# Patient Record
Sex: Male | Born: 1955 | Race: White | Hispanic: No | Marital: Single | Smoking: Current every day smoker
Health system: Southern US, Community
[De-identification: ages and names within clinical notes are randomized; demographics above are authoritative.]

## PROBLEM LIST (undated history)

## (undated) DIAGNOSIS — R569 Unspecified convulsions: Secondary | ICD-10-CM

## (undated) DIAGNOSIS — G459 Transient cerebral ischemic attack, unspecified: Secondary | ICD-10-CM

## (undated) DIAGNOSIS — R455 Hostility: Secondary | ICD-10-CM

## (undated) DIAGNOSIS — H9190 Unspecified hearing loss, unspecified ear: Secondary | ICD-10-CM

## (undated) DIAGNOSIS — G2119 Other drug induced secondary parkinsonism: Secondary | ICD-10-CM

## (undated) DIAGNOSIS — E7800 Pure hypercholesterolemia, unspecified: Secondary | ICD-10-CM

## (undated) DIAGNOSIS — N433 Hydrocele, unspecified: Secondary | ICD-10-CM

## (undated) DIAGNOSIS — K4090 Unilateral inguinal hernia, without obstruction or gangrene, not specified as recurrent: Secondary | ICD-10-CM

## (undated) DIAGNOSIS — F209 Schizophrenia, unspecified: Secondary | ICD-10-CM

## (undated) DIAGNOSIS — Z933 Colostomy status: Secondary | ICD-10-CM

## (undated) DIAGNOSIS — E46 Unspecified protein-calorie malnutrition: Secondary | ICD-10-CM

## (undated) DIAGNOSIS — F259 Schizoaffective disorder, unspecified: Secondary | ICD-10-CM

## (undated) DIAGNOSIS — R41841 Cognitive communication deficit: Secondary | ICD-10-CM

## (undated) DIAGNOSIS — E079 Disorder of thyroid, unspecified: Secondary | ICD-10-CM

## (undated) DIAGNOSIS — R1310 Dysphagia, unspecified: Secondary | ICD-10-CM

## (undated) DIAGNOSIS — K469 Unspecified abdominal hernia without obstruction or gangrene: Secondary | ICD-10-CM

## (undated) HISTORY — PX: TESTICLE SURGERY: SHX794

## (undated) HISTORY — PX: LEG SURGERY: SHX1003

---

## 2004-01-31 ENCOUNTER — Emergency Department (HOSPITAL_COMMUNITY): Admission: EM | Admit: 2004-01-31 | Discharge: 2004-01-31 | Payer: Self-pay | Admitting: Emergency Medicine

## 2005-11-30 ENCOUNTER — Emergency Department (HOSPITAL_COMMUNITY): Admission: EM | Admit: 2005-11-30 | Discharge: 2005-11-30 | Payer: Self-pay | Admitting: Emergency Medicine

## 2011-01-18 DIAGNOSIS — F259 Schizoaffective disorder, unspecified: Secondary | ICD-10-CM | POA: Diagnosis not present

## 2011-03-23 DIAGNOSIS — R3915 Urgency of urination: Secondary | ICD-10-CM | POA: Diagnosis not present

## 2011-03-23 DIAGNOSIS — E039 Hypothyroidism, unspecified: Secondary | ICD-10-CM | POA: Diagnosis not present

## 2011-03-23 DIAGNOSIS — E782 Mixed hyperlipidemia: Secondary | ICD-10-CM | POA: Diagnosis not present

## 2011-03-23 DIAGNOSIS — F3132 Bipolar disorder, current episode depressed, moderate: Secondary | ICD-10-CM | POA: Diagnosis not present

## 2011-03-23 DIAGNOSIS — J309 Allergic rhinitis, unspecified: Secondary | ICD-10-CM | POA: Diagnosis not present

## 2011-03-23 DIAGNOSIS — F315 Bipolar disorder, current episode depressed, severe, with psychotic features: Secondary | ICD-10-CM | POA: Diagnosis not present

## 2011-04-12 DIAGNOSIS — F259 Schizoaffective disorder, unspecified: Secondary | ICD-10-CM | POA: Diagnosis not present

## 2011-07-05 DIAGNOSIS — F259 Schizoaffective disorder, unspecified: Secondary | ICD-10-CM | POA: Diagnosis not present

## 2011-07-18 DIAGNOSIS — J019 Acute sinusitis, unspecified: Secondary | ICD-10-CM | POA: Diagnosis not present

## 2011-09-21 DIAGNOSIS — J0100 Acute maxillary sinusitis, unspecified: Secondary | ICD-10-CM | POA: Diagnosis not present

## 2011-10-17 DIAGNOSIS — Z23 Encounter for immunization: Secondary | ICD-10-CM | POA: Diagnosis not present

## 2011-10-25 DIAGNOSIS — F259 Schizoaffective disorder, unspecified: Secondary | ICD-10-CM | POA: Diagnosis not present

## 2011-11-14 DIAGNOSIS — K5900 Constipation, unspecified: Secondary | ICD-10-CM | POA: Diagnosis not present

## 2011-11-14 DIAGNOSIS — E039 Hypothyroidism, unspecified: Secondary | ICD-10-CM | POA: Diagnosis not present

## 2011-11-14 DIAGNOSIS — R634 Abnormal weight loss: Secondary | ICD-10-CM | POA: Diagnosis not present

## 2011-11-14 DIAGNOSIS — E782 Mixed hyperlipidemia: Secondary | ICD-10-CM | POA: Diagnosis not present

## 2012-01-09 DIAGNOSIS — K5900 Constipation, unspecified: Secondary | ICD-10-CM | POA: Diagnosis not present

## 2012-01-09 DIAGNOSIS — K4090 Unilateral inguinal hernia, without obstruction or gangrene, not specified as recurrent: Secondary | ICD-10-CM | POA: Diagnosis not present

## 2012-02-14 DIAGNOSIS — F259 Schizoaffective disorder, unspecified: Secondary | ICD-10-CM | POA: Diagnosis not present

## 2012-02-15 DIAGNOSIS — K5901 Slow transit constipation: Secondary | ICD-10-CM | POA: Diagnosis not present

## 2012-05-16 DIAGNOSIS — E782 Mixed hyperlipidemia: Secondary | ICD-10-CM | POA: Diagnosis not present

## 2012-05-16 DIAGNOSIS — N433 Hydrocele, unspecified: Secondary | ICD-10-CM | POA: Diagnosis not present

## 2012-06-05 DIAGNOSIS — F259 Schizoaffective disorder, unspecified: Secondary | ICD-10-CM | POA: Diagnosis not present

## 2012-08-28 DIAGNOSIS — F209 Schizophrenia, unspecified: Secondary | ICD-10-CM | POA: Diagnosis not present

## 2012-09-02 DIAGNOSIS — Z125 Encounter for screening for malignant neoplasm of prostate: Secondary | ICD-10-CM | POA: Diagnosis not present

## 2012-09-02 DIAGNOSIS — F319 Bipolar disorder, unspecified: Secondary | ICD-10-CM | POA: Diagnosis not present

## 2012-09-02 DIAGNOSIS — J449 Chronic obstructive pulmonary disease, unspecified: Secondary | ICD-10-CM | POA: Diagnosis not present

## 2012-09-02 DIAGNOSIS — E039 Hypothyroidism, unspecified: Secondary | ICD-10-CM | POA: Diagnosis not present

## 2012-09-02 DIAGNOSIS — R634 Abnormal weight loss: Secondary | ICD-10-CM | POA: Diagnosis not present

## 2012-09-11 ENCOUNTER — Other Ambulatory Visit (HOSPITAL_COMMUNITY): Payer: Self-pay | Admitting: Pulmonary Disease

## 2012-09-11 DIAGNOSIS — R634 Abnormal weight loss: Secondary | ICD-10-CM

## 2012-09-16 ENCOUNTER — Ambulatory Visit (HOSPITAL_COMMUNITY): Payer: Medicare Other

## 2012-09-16 ENCOUNTER — Ambulatory Visit (HOSPITAL_COMMUNITY)
Admission: RE | Admit: 2012-09-16 | Discharge: 2012-09-16 | Disposition: A | Discharge status: Home / Self Care | Payer: Medicare Other | Source: Ambulatory Visit | Attending: Pulmonary Disease | Admitting: Pulmonary Disease

## 2012-09-16 DIAGNOSIS — R109 Unspecified abdominal pain: Secondary | ICD-10-CM

## 2012-09-16 DIAGNOSIS — J449 Chronic obstructive pulmonary disease, unspecified: Secondary | ICD-10-CM

## 2012-09-16 DIAGNOSIS — R634 Abnormal weight loss: Secondary | ICD-10-CM

## 2012-09-16 MED ORDER — IOHEXOL 300 MG/ML  SOLN: 100.0000 mL | Freq: Once | INTRAMUSCULAR | Status: AC | PRN

## 2012-09-19 ENCOUNTER — Other Ambulatory Visit (HOSPITAL_COMMUNITY): Payer: Self-pay

## 2012-09-19 ENCOUNTER — Ambulatory Visit (HOSPITAL_COMMUNITY)
Admission: RE | Admit: 2012-09-19 | Discharge: 2012-09-19 | Disposition: A | Discharge status: Home / Self Care | Payer: Medicare Other | Source: Ambulatory Visit | Attending: Pulmonary Disease | Admitting: Pulmonary Disease

## 2012-09-19 DIAGNOSIS — R933 Abnormal findings on diagnostic imaging of other parts of digestive tract: Secondary | ICD-10-CM | POA: Insufficient documentation

## 2012-09-19 DIAGNOSIS — R109 Unspecified abdominal pain: Secondary | ICD-10-CM | POA: Insufficient documentation

## 2012-09-19 DIAGNOSIS — K4090 Unilateral inguinal hernia, without obstruction or gangrene, not specified as recurrent: Secondary | ICD-10-CM | POA: Insufficient documentation

## 2012-09-19 DIAGNOSIS — R634 Abnormal weight loss: Secondary | ICD-10-CM | POA: Insufficient documentation

## 2012-09-19 DIAGNOSIS — J4489 Other specified chronic obstructive pulmonary disease: Secondary | ICD-10-CM | POA: Insufficient documentation

## 2012-09-19 DIAGNOSIS — J449 Chronic obstructive pulmonary disease, unspecified: Secondary | ICD-10-CM | POA: Diagnosis not present

## 2012-09-19 DIAGNOSIS — R918 Other nonspecific abnormal finding of lung field: Secondary | ICD-10-CM | POA: Insufficient documentation

## 2012-09-19 DIAGNOSIS — J438 Other emphysema: Secondary | ICD-10-CM | POA: Diagnosis not present

## 2012-09-19 DIAGNOSIS — K7689 Other specified diseases of liver: Secondary | ICD-10-CM | POA: Diagnosis not present

## 2012-09-19 MED ORDER — IOHEXOL 300 MG/ML  SOLN
100.0000 mL | Freq: Once | INTRAMUSCULAR | Status: AC | PRN
  Administered 2012-09-19: 100 mL via INTRAVENOUS

## 2012-09-29 ENCOUNTER — Encounter (HOSPITAL_COMMUNITY): Payer: Self-pay | Admitting: *Deleted

## 2012-09-29 ENCOUNTER — Emergency Department (HOSPITAL_COMMUNITY): Payer: Medicare Other

## 2012-09-29 ENCOUNTER — Emergency Department (HOSPITAL_COMMUNITY)
Admission: EM | Admit: 2012-09-29 | Discharge: 2012-09-29 | Disposition: A | Discharge status: Home / Self Care | Payer: Medicare Other | Attending: Emergency Medicine | Admitting: Emergency Medicine

## 2012-09-29 DIAGNOSIS — E7800 Pure hypercholesterolemia, unspecified: Secondary | ICD-10-CM | POA: Diagnosis not present

## 2012-09-29 DIAGNOSIS — K4090 Unilateral inguinal hernia, without obstruction or gangrene, not specified as recurrent: Secondary | ICD-10-CM | POA: Diagnosis not present

## 2012-09-29 DIAGNOSIS — Z8719 Personal history of other diseases of the digestive system: Secondary | ICD-10-CM | POA: Insufficient documentation

## 2012-09-29 DIAGNOSIS — Z87891 Personal history of nicotine dependence: Secondary | ICD-10-CM | POA: Insufficient documentation

## 2012-09-29 DIAGNOSIS — Z8669 Personal history of other diseases of the nervous system and sense organs: Secondary | ICD-10-CM | POA: Diagnosis not present

## 2012-09-29 DIAGNOSIS — K529 Noninfective gastroenteritis and colitis, unspecified: Secondary | ICD-10-CM

## 2012-09-29 DIAGNOSIS — Z79899 Other long term (current) drug therapy: Secondary | ICD-10-CM | POA: Insufficient documentation

## 2012-09-29 DIAGNOSIS — E079 Disorder of thyroid, unspecified: Secondary | ICD-10-CM | POA: Diagnosis not present

## 2012-09-29 DIAGNOSIS — K5289 Other specified noninfective gastroenteritis and colitis: Secondary | ICD-10-CM | POA: Diagnosis not present

## 2012-09-29 DIAGNOSIS — F209 Schizophrenia, unspecified: Secondary | ICD-10-CM | POA: Insufficient documentation

## 2012-09-29 HISTORY — DX: Unspecified hearing loss, unspecified ear: H91.90

## 2012-09-29 HISTORY — DX: Schizophrenia, unspecified: F20.9

## 2012-09-29 HISTORY — DX: Pure hypercholesterolemia, unspecified: E78.00

## 2012-09-29 HISTORY — DX: Disorder of thyroid, unspecified: E07.9

## 2012-09-29 HISTORY — DX: Unspecified abdominal hernia without obstruction or gangrene: K46.9

## 2012-09-29 LAB — CBC WITH DIFFERENTIAL/PLATELET
Eosinophils Absolute: 0.1 10*3/uL (ref 0.0–0.7)
Lymphs Abs: 1.8 10*3/uL (ref 0.7–4.0)
MCH: 29.8 pg (ref 26.0–34.0)
Neutro Abs: 11.8 10*3/uL — ABNORMAL HIGH (ref 1.7–7.7)
Neutrophils Relative %: 82 % — ABNORMAL HIGH (ref 43–77)
Platelets: 104 10*3/uL — ABNORMAL LOW (ref 150–400)
RBC: 4.2 MIL/uL — ABNORMAL LOW (ref 4.22–5.81)
WBC: 14.5 10*3/uL — ABNORMAL HIGH (ref 4.0–10.5)

## 2012-09-29 LAB — BASIC METABOLIC PANEL
CO2: 27 mEq/L (ref 19–32)
Calcium: 10.3 mg/dL (ref 8.4–10.5)
GFR calc non Af Amer: 71 mL/min — ABNORMAL LOW (ref >90)
Potassium: 3.8 mEq/L (ref 3.5–5.1)
Sodium: 136 mEq/L (ref 135–145)

## 2012-09-29 MED ORDER — SODIUM CHLORIDE 0.9 % IV BOLUS (SEPSIS)
1000.0000 mL | Freq: Once | INTRAVENOUS | Status: AC
  Administered 2012-09-29: 1000 mL via INTRAVENOUS

## 2012-09-29 MED ORDER — DIPHENOXYLATE-ATROPINE 2.5-0.025 MG PO TABS: 1.0000 | ORAL_TABLET | Freq: Four times a day (QID) | ORAL | Status: DC | PRN

## 2012-09-29 NOTE — ED Provider Notes (Signed)
CSN: [id number]     Arrival date & time 09/29/12  1819 History   First MD Initiated Contact with Patient 09/29/12 1855     Chief Complaint  Patient presents with  . Diarrhea   (Consider location/radiation/quality/duration/timing/severity/associated sxs/prior Treatment) Patient is a 57 y.o. male presenting with diarrhea. The history is provided by the nursing home (pt started with diarhea today.  pt had 4 bouts of diarhea).  Diarrhea Quality:  Watery Severity:  Moderate Onset quality:  Sudden Timing:  Intermittent Progression:  Unchanged Associated symptoms: no abdominal pain and no headaches     Past Medical History  Diagnosis Date  . Schizophrenia   . Hernia   . Thyroid disease   . High cholesterol   . HOH (hard of hearing)    Past Surgical History  Procedure Laterality Date  . Leg surgery    . Testicle surgery     No family history on file. History  Substance Use Topics  . Smoking status: Former Games developer  . Smokeless tobacco: Not on file  . Alcohol Use: Not on file    Review of Systems  Constitutional: Negative for appetite change and fatigue.  HENT: Negative for congestion, sinus pressure and ear discharge.   Eyes: Negative for discharge.  Respiratory: Negative for cough.   Cardiovascular: Negative for chest pain.  Gastrointestinal: Positive for diarrhea. Negative for abdominal pain.  Genitourinary: Negative for frequency and hematuria.  Musculoskeletal: Negative for back pain.  Skin: Negative for rash.  Neurological: Negative for seizures and headaches.  Psychiatric/Behavioral: Negative for hallucinations.    Allergies  Review of patient's allergies indicates no known allergies.  Home Medications   Current Outpatient Rx  Name  Route  Sig  Dispense  Refill  . fish oil-omega-3 fatty acids 1000 MG capsule   Oral   Take 1 g by mouth 3 (three) times daily.         . haloperidol (HALDOL) 10 MG tablet   Oral   Take 10 mg by mouth every evening.         . haloperidol decanoate (HALDOL DECANOATE) 100 MG/ML injection   Intramuscular   Inject 100 mg into the muscle every 28 (twenty-eight) days. Administered by [facility]         . levothyroxine (SYNTHROID, LEVOTHROID) 100 MCG tablet   Oral   Take 100 mcg by mouth daily before breakfast.         . lithium carbonate 300 MG capsule   Oral   Take 300 mg by mouth 2 (two) times daily.         Marland Kitchen loratadine (CLARITIN) 10 MG tablet   Oral   Take 10 mg by mouth daily.         Marland Kitchen lovastatin (MEVACOR) 20 MG tablet   Oral   Take 20 mg by mouth daily.         . nicotine (NICODERM CQ - DOSED IN MG/24 HOURS) 21 mg/24hr patch   Transdermal   Place 1 patch onto the skin daily.         Marland Kitchen oxybutynin (DITROPAN-XL) 10 MG 24 hr tablet   Oral   Take 10 mg by mouth daily.         . polyethylene glycol powder (GLYCOLAX/MIRALAX) powder   Oral   Take 17 g by mouth daily. Dissolved in 8 ounces of water/juice daily         . diphenoxylate-atropine (LOMOTIL) 2.5-0.025 MG per tablet   Oral   Take 1  tablet by mouth 4 (four) times daily as needed for diarrhea or loose stools.   10 tablet   0    BP 124/72  Pulse 87  Temp(Src) 98.5 F (36.9 C) (Oral)  Resp 18  Ht 5\' 5"  (1.651 m)  Wt 131 lb (59.421 kg)  BMI 21.8 kg/m2  SpO2 94% Physical Exam  Constitutional: He appears well-developed.  HENT:  Head: Normocephalic.  Eyes: Conjunctivae and EOM are normal. No scleral icterus.  Neck: Neck supple. No thyromegaly present.  Cardiovascular: Normal rate and regular rhythm.  Exam reveals no gallop and no friction rub.   No murmur heard. Pulmonary/Chest: No stridor. He has no wheezes. He has no rales. He exhibits no tenderness.  Abdominal: He exhibits no distension. There is no tenderness. There is no rebound.  Musculoskeletal: Normal range of motion. He exhibits no edema.  Lymphadenopathy:    He has no cervical adenopathy.  Neurological: He is alert. Coordination normal.  Skin: No rash  noted. No erythema.  Psychiatric: He has a normal mood and affect. His behavior is normal.    ED Course  Procedures (including critical care time) Labs Review Labs Reviewed  CBC WITH DIFFERENTIAL - Abnormal; Notable for the following:    WBC 14.5 (*)    RBC 4.20 (*)    Hemoglobin 12.5 (*)    HCT 38.6 (*)    Platelets 104 (*)    Neutrophils Relative % 82 (*)    Neutro Abs 11.8 (*)    All other components within normal limits  BASIC METABOLIC PANEL - Abnormal; Notable for the following:    Glucose, Bld 132 (*)    GFR calc non Af Amer 71 (*)    GFR calc Af Amer 83 (*)    All other components within normal limits   Imaging Review Dg Abd Acute W/chest  09/29/2012   CLINICAL DATA:  Progressive diarrhea over the last month.  EXAM: ACUTE ABDOMEN SERIES (ABDOMEN 2 VIEW & CHEST 1 VIEW)  COMPARISON:  Abdominal pelvic CT 09/19/2012.  FINDINGS: The heart size and mediastinal contours are normal. The lungs are clear. There is no pleural effusion or pneumothorax. No acute osseous findings are identified.  Scattered air-fluid levels are noted within the stomach, small bowel and colon. No significant bowel distention or wall thickening is identified. Gas is again noted in the left perineal region, corresponding with a large inguinal hernia on prior CT. There is no free intraperitoneal air. There are no suspicious calcifications. Mild lumbar spondylosis is noted.  IMPRESSION: Nonspecific bowel gas pattern with scattered air-fluid levels in nondistended bowel. Known left inguinal hernia. No active cardiopulmonary process.   Electronically Signed   By: Roxy Horseman   On: 09/29/2012 20:10    MDM   1. Gastroenteritis        Benny Lennert, MD 09/29/12 2045

## 2012-09-29 NOTE — ED Notes (Signed)
Pt c/o diarrhea for the past month that has gotten worse, pt c/o pain to knees, feet, rectal area, throat. Lives in assisted living facility. [facility],

## 2012-10-03 DIAGNOSIS — R634 Abnormal weight loss: Secondary | ICD-10-CM | POA: Diagnosis not present

## 2012-10-03 DIAGNOSIS — H902 Conductive hearing loss, unspecified: Secondary | ICD-10-CM | POA: Diagnosis not present

## 2012-10-03 DIAGNOSIS — K432 Incisional hernia without obstruction or gangrene: Secondary | ICD-10-CM | POA: Diagnosis not present

## 2012-10-08 DIAGNOSIS — K4090 Unilateral inguinal hernia, without obstruction or gangrene, not specified as recurrent: Secondary | ICD-10-CM | POA: Diagnosis not present

## 2012-10-24 ENCOUNTER — Ambulatory Visit (INDEPENDENT_AMBULATORY_CARE_PROVIDER_SITE_OTHER): Payer: Medicare Other | Admitting: Otolaryngology

## 2012-10-24 DIAGNOSIS — H6120 Impacted cerumen, unspecified ear: Secondary | ICD-10-CM | POA: Diagnosis not present

## 2012-10-24 DIAGNOSIS — H906 Mixed conductive and sensorineural hearing loss, bilateral: Secondary | ICD-10-CM | POA: Diagnosis not present

## 2012-11-14 DIAGNOSIS — J449 Chronic obstructive pulmonary disease, unspecified: Secondary | ICD-10-CM | POA: Diagnosis not present

## 2012-11-14 DIAGNOSIS — K4090 Unilateral inguinal hernia, without obstruction or gangrene, not specified as recurrent: Secondary | ICD-10-CM | POA: Diagnosis not present

## 2012-11-14 DIAGNOSIS — I10 Essential (primary) hypertension: Secondary | ICD-10-CM | POA: Diagnosis not present

## 2012-11-19 DIAGNOSIS — K4090 Unilateral inguinal hernia, without obstruction or gangrene, not specified as recurrent: Secondary | ICD-10-CM | POA: Diagnosis not present

## 2012-11-20 DIAGNOSIS — Z79899 Other long term (current) drug therapy: Secondary | ICD-10-CM | POA: Diagnosis not present

## 2012-11-20 DIAGNOSIS — F209 Schizophrenia, unspecified: Secondary | ICD-10-CM | POA: Diagnosis not present

## 2013-03-12 DIAGNOSIS — F209 Schizophrenia, unspecified: Secondary | ICD-10-CM | POA: Diagnosis not present

## 2013-04-11 ENCOUNTER — Emergency Department (HOSPITAL_COMMUNITY): Payer: Medicare Other

## 2013-04-11 ENCOUNTER — Observation Stay (HOSPITAL_COMMUNITY): Payer: Medicare Other

## 2013-04-11 ENCOUNTER — Encounter (HOSPITAL_COMMUNITY): Payer: Self-pay | Admitting: Emergency Medicine

## 2013-04-11 ENCOUNTER — Inpatient Hospital Stay (HOSPITAL_COMMUNITY)
Admission: EM | Admit: 2013-04-11 | Discharge: 2013-04-16 | DRG: 871 | Disposition: A | Discharge status: Home Health | Payer: Medicare Other | Attending: Pulmonary Disease | Admitting: Pulmonary Disease

## 2013-04-11 DIAGNOSIS — R6510 Systemic inflammatory response syndrome (SIRS) of non-infectious origin without acute organ dysfunction: Secondary | ICD-10-CM | POA: Diagnosis present

## 2013-04-11 DIAGNOSIS — R55 Syncope and collapse: Secondary | ICD-10-CM | POA: Diagnosis not present

## 2013-04-11 DIAGNOSIS — Z87891 Personal history of nicotine dependence: Secondary | ICD-10-CM | POA: Diagnosis not present

## 2013-04-11 DIAGNOSIS — R32 Unspecified urinary incontinence: Secondary | ICD-10-CM | POA: Diagnosis present

## 2013-04-11 DIAGNOSIS — J690 Pneumonitis due to inhalation of food and vomit: Secondary | ICD-10-CM | POA: Diagnosis not present

## 2013-04-11 DIAGNOSIS — E46 Unspecified protein-calorie malnutrition: Secondary | ICD-10-CM | POA: Diagnosis present

## 2013-04-11 DIAGNOSIS — D696 Thrombocytopenia, unspecified: Secondary | ICD-10-CM | POA: Diagnosis present

## 2013-04-11 DIAGNOSIS — E039 Hypothyroidism, unspecified: Secondary | ICD-10-CM | POA: Diagnosis not present

## 2013-04-11 DIAGNOSIS — K4090 Unilateral inguinal hernia, without obstruction or gangrene, not specified as recurrent: Secondary | ICD-10-CM | POA: Diagnosis not present

## 2013-04-11 DIAGNOSIS — F209 Schizophrenia, unspecified: Secondary | ICD-10-CM | POA: Diagnosis present

## 2013-04-11 DIAGNOSIS — R569 Unspecified convulsions: Secondary | ICD-10-CM | POA: Diagnosis not present

## 2013-04-11 DIAGNOSIS — Z79899 Other long term (current) drug therapy: Secondary | ICD-10-CM | POA: Diagnosis not present

## 2013-04-11 DIAGNOSIS — Z8673 Personal history of transient ischemic attack (TIA), and cerebral infarction without residual deficits: Secondary | ICD-10-CM

## 2013-04-11 DIAGNOSIS — A419 Sepsis, unspecified organism: Principal | ICD-10-CM | POA: Diagnosis present

## 2013-04-11 DIAGNOSIS — E43 Unspecified severe protein-calorie malnutrition: Secondary | ICD-10-CM | POA: Diagnosis not present

## 2013-04-11 DIAGNOSIS — H9190 Unspecified hearing loss, unspecified ear: Secondary | ICD-10-CM | POA: Diagnosis present

## 2013-04-11 DIAGNOSIS — E7800 Pure hypercholesterolemia, unspecified: Secondary | ICD-10-CM | POA: Diagnosis present

## 2013-04-11 DIAGNOSIS — Z681 Body mass index (BMI) 19 or less, adult: Secondary | ICD-10-CM | POA: Diagnosis not present

## 2013-04-11 DIAGNOSIS — J189 Pneumonia, unspecified organism: Secondary | ICD-10-CM | POA: Diagnosis not present

## 2013-04-11 DIAGNOSIS — I369 Nonrheumatic tricuspid valve disorder, unspecified: Secondary | ICD-10-CM | POA: Diagnosis not present

## 2013-04-11 DIAGNOSIS — F489 Nonpsychotic mental disorder, unspecified: Secondary | ICD-10-CM | POA: Diagnosis not present

## 2013-04-11 DIAGNOSIS — S060X9A Concussion with loss of consciousness of unspecified duration, initial encounter: Secondary | ICD-10-CM | POA: Diagnosis not present

## 2013-04-11 DIAGNOSIS — J9600 Acute respiratory failure, unspecified whether with hypoxia or hypercapnia: Secondary | ICD-10-CM | POA: Diagnosis not present

## 2013-04-11 DIAGNOSIS — S298XXA Other specified injuries of thorax, initial encounter: Secondary | ICD-10-CM | POA: Diagnosis not present

## 2013-04-11 LAB — COMPREHENSIVE METABOLIC PANEL
ALBUMIN: 3.5 g/dL (ref 3.5–5.2)
ALK PHOS: 55 U/L (ref 39–117)
ALT: 7 U/L (ref 0–53)
AST: 13 U/L (ref 0–37)
BILIRUBIN TOTAL: 0.4 mg/dL (ref 0.3–1.2)
BUN: 20 mg/dL (ref 6–23)
CHLORIDE: 101 meq/L (ref 96–112)
CO2: 26 mEq/L (ref 19–32)
Calcium: 9.7 mg/dL (ref 8.4–10.5)
Creatinine, Ser: 1.23 mg/dL (ref 0.50–1.35)
GFR calc Af Amer: 74 mL/min — ABNORMAL LOW (ref >90)
GFR calc non Af Amer: 64 mL/min — ABNORMAL LOW (ref >90)
Glucose, Bld: 192 mg/dL — ABNORMAL HIGH (ref 70–99)
POTASSIUM: 4.1 meq/L (ref 3.7–5.3)
SODIUM: 139 meq/L (ref 137–147)
TOTAL PROTEIN: 7.4 g/dL (ref 6.0–8.3)

## 2013-04-11 LAB — URINALYSIS, ROUTINE W REFLEX MICROSCOPIC
BILIRUBIN URINE: NEGATIVE
Glucose, UA: NEGATIVE mg/dL
HGB URINE DIPSTICK: NEGATIVE
Ketones, ur: NEGATIVE mg/dL
Leukocytes, UA: NEGATIVE
NITRITE: NEGATIVE
PH: 6.5 (ref 5.0–8.0)
Protein, ur: NEGATIVE mg/dL
SPECIFIC GRAVITY, URINE: 1.01 (ref 1.005–1.030)
UROBILINOGEN UA: 0.2 mg/dL (ref 0.0–1.0)

## 2013-04-11 LAB — TROPONIN I: Troponin I: <0.3 ng/mL (ref <0.30)

## 2013-04-11 LAB — CBC WITH DIFFERENTIAL/PLATELET
BASOS ABS: 0 10*3/uL (ref 0.0–0.1)
Basophils Relative: 0 % (ref 0–1)
EOS ABS: 0 10*3/uL (ref 0.0–0.7)
Eosinophils Relative: 0 % (ref 0–5)
HEMATOCRIT: 34.4 % — AB (ref 39.0–52.0)
Hemoglobin: 11.4 g/dL — ABNORMAL LOW (ref 13.0–17.0)
LYMPHS PCT: 4 % — AB (ref 12–46)
Lymphs Abs: 0.8 10*3/uL (ref 0.7–4.0)
MCH: 29.7 pg (ref 26.0–34.0)
MCHC: 33.1 g/dL (ref 30.0–36.0)
MCV: 89.6 fL (ref 78.0–100.0)
MONOS PCT: 3 % (ref 3–12)
Monocytes Absolute: 0.6 10*3/uL (ref 0.1–1.0)
NEUTROS PCT: 93 % — AB (ref 43–77)
Neutro Abs: 17.7 10*3/uL — ABNORMAL HIGH (ref 1.7–7.7)
PLATELETS: 110 10*3/uL — AB (ref 150–400)
RBC: 3.84 MIL/uL — ABNORMAL LOW (ref 4.22–5.81)
RDW: 14.7 % (ref 11.5–15.5)
WBC MORPHOLOGY: INCREASED
WBC: 19.1 10*3/uL — AB (ref 4.0–10.5)

## 2013-04-11 LAB — RAPID URINE DRUG SCREEN, HOSP PERFORMED
AMPHETAMINES: NOT DETECTED
BARBITURATES: NOT DETECTED
BENZODIAZEPINES: NOT DETECTED
Cocaine: NOT DETECTED
Opiates: NOT DETECTED
Tetrahydrocannabinol: NOT DETECTED

## 2013-04-11 LAB — ETHANOL

## 2013-04-11 LAB — LACTIC ACID, PLASMA: Lactic Acid, Venous: 1.3 mmol/L (ref 0.5–2.2)

## 2013-04-11 LAB — LITHIUM LEVEL: LITHIUM LVL: 0.8 meq/L (ref 0.80–1.40)

## 2013-04-11 LAB — LIPASE, BLOOD: LIPASE: 12 U/L (ref 11–59)

## 2013-04-11 MED ORDER — SODIUM CHLORIDE 0.9 % IV SOLN
INTRAVENOUS | Status: DC
  Administered 2013-04-13 – 2013-04-15 (×3): via INTRAVENOUS

## 2013-04-11 MED ORDER — DOCUSATE SODIUM 100 MG PO CAPS: 100.0000 mg | ORAL_CAPSULE | Freq: Every evening | ORAL | Status: DC | PRN

## 2013-04-11 MED ORDER — PIPERACILLIN-TAZOBACTAM 3.375 G IVPB
INTRAVENOUS | Status: AC
  Filled 2013-04-11: qty 100

## 2013-04-11 MED ORDER — GUAIFENESIN 100 MG/5ML PO SOLN: 200.0000 mg | ORAL | Status: DC | PRN

## 2013-04-11 MED ORDER — NICOTINE 21 MG/24HR TD PT24: 21.0000 mg | MEDICATED_PATCH | TRANSDERMAL | Status: DC

## 2013-04-11 MED ORDER — ACETAMINOPHEN 325 MG PO TABS
650.0000 mg | ORAL_TABLET | Freq: Once | ORAL | Status: AC
  Administered 2013-04-11: 650 mg via ORAL
  Filled 2013-04-11: qty 2

## 2013-04-11 MED ORDER — BIOTENE DRY MOUTH MT LIQD
15.0000 mL | Freq: Two times a day (BID) | OROMUCOSAL | Status: DC
  Administered 2013-04-11 – 2013-04-16 (×10): 15 mL via OROMUCOSAL

## 2013-04-11 MED ORDER — OMEGA-3-ACID ETHYL ESTERS 1 G PO CAPS
1.0000 g | ORAL_CAPSULE | Freq: Three times a day (TID) | ORAL | Status: DC
  Administered 2013-04-11 – 2013-04-16 (×14): 1 g via ORAL
  Filled 2013-04-11 (×14): qty 1

## 2013-04-11 MED ORDER — OXYBUTYNIN CHLORIDE ER 5 MG PO TB24
10.0000 mg | ORAL_TABLET | Freq: Every day | ORAL | Status: DC
  Administered 2013-04-12 – 2013-04-16 (×5): 10 mg via ORAL
  Filled 2013-04-11 (×6): qty 2

## 2013-04-11 MED ORDER — VANCOMYCIN HCL IN DEXTROSE 1-5 GM/200ML-% IV SOLN
1000.0000 mg | Freq: Once | INTRAVENOUS | Status: AC
  Administered 2013-04-11: 1000 mg via INTRAVENOUS
  Filled 2013-04-11: qty 200

## 2013-04-11 MED ORDER — LITHIUM CARBONATE 150 MG PO CAPS
300.0000 mg | ORAL_CAPSULE | Freq: Two times a day (BID) | ORAL | Status: DC
  Administered 2013-04-11 – 2013-04-16 (×10): 300 mg via ORAL
  Filled 2013-04-11 (×2): qty 1
  Filled 2013-04-11: qty 2
  Filled 2013-04-11 (×2): qty 1
  Filled 2013-04-11: qty 2
  Filled 2013-04-11 (×7): qty 1

## 2013-04-11 MED ORDER — HALOPERIDOL 5 MG PO TABS
ORAL_TABLET | ORAL | Status: AC
  Filled 2013-04-11: qty 2

## 2013-04-11 MED ORDER — ONDANSETRON HCL 4 MG PO TABS: 4.0000 mg | ORAL_TABLET | Freq: Four times a day (QID) | ORAL | Status: DC | PRN

## 2013-04-11 MED ORDER — SODIUM CHLORIDE 0.9 % IJ SOLN
3.0000 mL | Freq: Two times a day (BID) | INTRAMUSCULAR | Status: DC
  Administered 2013-04-13 – 2013-04-15 (×4): 3 mL via INTRAVENOUS

## 2013-04-11 MED ORDER — ACETAMINOPHEN 650 MG RE SUPP
650.0000 mg | Freq: Four times a day (QID) | RECTAL | Status: DC | PRN
  Administered 2013-04-11 (×2): 650 mg via RECTAL
  Filled 2013-04-11: qty 1

## 2013-04-11 MED ORDER — NICOTINE 21 MG/24HR TD PT24
21.0000 mg | MEDICATED_PATCH | Freq: Every day | TRANSDERMAL | Status: DC
  Administered 2013-04-12 – 2013-04-16 (×5): 21 mg via TRANSDERMAL
  Filled 2013-04-11 (×5): qty 1

## 2013-04-11 MED ORDER — HALOPERIDOL 2 MG PO TABS
10.0000 mg | ORAL_TABLET | Freq: Every evening | ORAL | Status: DC
  Administered 2013-04-11 – 2013-04-15 (×5): 10 mg via ORAL
  Filled 2013-04-11 (×4): qty 5

## 2013-04-11 MED ORDER — ACETAMINOPHEN 325 MG PO TABS
650.0000 mg | ORAL_TABLET | Freq: Four times a day (QID) | ORAL | Status: DC | PRN
  Administered 2013-04-14: 650 mg via ORAL
  Filled 2013-04-11: qty 2

## 2013-04-11 MED ORDER — DIPHENOXYLATE-ATROPINE 2.5-0.025 MG PO TABS: 1.0000 | ORAL_TABLET | Freq: Four times a day (QID) | ORAL | Status: DC | PRN

## 2013-04-11 MED ORDER — SIMVASTATIN 20 MG PO TABS
10.0000 mg | ORAL_TABLET | Freq: Every day | ORAL | Status: DC
  Administered 2013-04-12 – 2013-04-15 (×4): 10 mg via ORAL
  Filled 2013-04-11 (×4): qty 1

## 2013-04-11 MED ORDER — ASPIRIN 300 MG RE SUPP: 300.0000 mg | Freq: Every day | RECTAL | Status: DC

## 2013-04-11 MED ORDER — ONDANSETRON HCL 4 MG/2ML IJ SOLN: 4.0000 mg | Freq: Four times a day (QID) | INTRAMUSCULAR | Status: DC | PRN

## 2013-04-11 MED ORDER — POLYETHYLENE GLYCOL 3350 17 GM/SCOOP PO POWD
17.0000 g | Freq: Every day | ORAL | Status: DC
  Filled 2013-04-11: qty 255

## 2013-04-11 MED ORDER — IPRATROPIUM-ALBUTEROL 0.5-2.5 (3) MG/3ML IN SOLN: 3.0000 mL | Freq: Four times a day (QID) | RESPIRATORY_TRACT | Status: DC | PRN

## 2013-04-11 MED ORDER — PIPERACILLIN-TAZOBACTAM 3.375 G IVPB
3.3750 g | Freq: Three times a day (TID) | INTRAVENOUS | Status: DC
  Administered 2013-04-11 – 2013-04-16 (×14): 3.375 g via INTRAVENOUS
  Filled 2013-04-11 (×17): qty 50

## 2013-04-11 MED ORDER — LEVOTHYROXINE SODIUM 100 MCG PO TABS
100.0000 ug | ORAL_TABLET | Freq: Every day | ORAL | Status: DC
  Administered 2013-04-12 – 2013-04-16 (×5): 100 ug via ORAL
  Filled 2013-04-11 (×5): qty 1

## 2013-04-11 MED ORDER — SODIUM CHLORIDE 0.9 % IV SOLN
INTRAVENOUS | Status: AC
  Administered 2013-04-11: 20:00:00 via INTRAVENOUS

## 2013-04-11 MED ORDER — LORATADINE 10 MG PO TABS
10.0000 mg | ORAL_TABLET | Freq: Every day | ORAL | Status: DC
  Administered 2013-04-12 – 2013-04-16 (×5): 10 mg via ORAL
  Filled 2013-04-11 (×5): qty 1

## 2013-04-11 MED ORDER — VANCOMYCIN HCL IN DEXTROSE 750-5 MG/150ML-% IV SOLN
750.0000 mg | Freq: Two times a day (BID) | INTRAVENOUS | Status: DC
  Administered 2013-04-12 – 2013-04-14 (×5): 750 mg via INTRAVENOUS
  Filled 2013-04-11 (×5): qty 150

## 2013-04-11 MED ORDER — ENOXAPARIN SODIUM 40 MG/0.4ML ~~LOC~~ SOLN
40.0000 mg | SUBCUTANEOUS | Status: DC
  Administered 2013-04-11 – 2013-04-15 (×5): 40 mg via SUBCUTANEOUS
  Filled 2013-04-11 (×5): qty 0.4

## 2013-04-11 NOTE — ED Notes (Signed)
[place]- [phone] Administrator of [facility]. Can pick up if discharged.

## 2013-04-11 NOTE — ED Notes (Signed)
Per EMS pt found at [facility] due to fall. Pt was found at [place], incontinent of urine, possibly vomited. Witness on scene was poor historian. Pt has speech impetement. CBG 187. IV 18G RAC. BP 132/66 HR 130. No obvious deformities from possible fall.

## 2013-04-11 NOTE — ED Provider Notes (Signed)
CSN: [id number]     Arrival date & time 04/11/13  1304 History   First MD Initiated Contact with Patient 04/11/13 1314     Chief Complaint  Patient presents with  . Fall      HPI Pt was seen at 14. Per EMS, witnesses and pt report, c/o sudden onset and resolution of one episode of brief syncope that occurred PTA. Pt was found on the floor at a local diner, incont of urine with emesis on clothes. Pt himself does not recall events PTA. Pt states "they told me I passed out and fell down." Denies prodromal symptoms before fall/syncope. Denies CP/palpitations, no SOB/cough, no abd pain, no neck or back pain, no focal motor weakness, no tingling/numbness in extremities.     Past Medical History  Diagnosis Date  . Schizophrenia   . Hernia   . Thyroid disease   . High cholesterol   . HOH (hard of hearing)    Past Surgical History  Procedure Laterality Date  . Leg surgery    . Testicle surgery      History  Substance Use Topics  . Smoking status: Former Research scientist (life sciences)  . Smokeless tobacco: Not on file  . Alcohol Use: No    Review of Systems ROS: Statement: All systems negative except as marked or noted in the HPI; Constitutional: Negative for fever and chills. ; ; Eyes: Negative for eye pain, redness and discharge. ; ; ENMT: Negative for ear pain, hoarseness, nasal congestion, sinus pressure and sore throat. ; ; Cardiovascular: Negative for chest pain, palpitations, diaphoresis, dyspnea and peripheral edema. ; ; Respiratory: Negative for cough, wheezing and stridor. ; ; Gastrointestinal: +N/V. Negative for diarrhea, abdominal pain, blood in stool, hematemesis, jaundice and rectal bleeding. . ; ; Genitourinary: +incont urine. Negative for dysuria, flank pain and hematuria. ; ; Musculoskeletal: Negative for back pain and neck pain. Negative for swelling and trauma.; ; Skin: Negative for pruritus, rash, abrasions, blisters, bruising and skin lesion.; ; Neuro: Negative for headache, lightheadedness  and neck stiffness. Negative for weakness, extremity weakness, paresthesias, involuntary movement, seizure and +syncope.      Allergies  Review of patient's allergies indicates no known allergies.  Home Medications   Current Outpatient Rx  Name  Route  Sig  Dispense  Refill  . docusate sodium (COLACE) 100 MG capsule   Oral   Take 100 mg by mouth at bedtime as needed for mild constipation.         . fish oil-omega-3 fatty acids 1000 MG capsule   Oral   Take 1 g by mouth 3 (three) times daily.         Marland Kitchen guaifenesin (ROBITUSSIN) 100 MG/5ML syrup   Oral   Take 200 mg by mouth every 4 (four) hours as needed for cough.         . haloperidol (HALDOL) 10 MG tablet   Oral   Take 10 mg by mouth every evening.         Marland Kitchen levothyroxine (SYNTHROID, LEVOTHROID) 100 MCG tablet   Oral   Take 100 mcg by mouth daily before breakfast.         . lithium carbonate 300 MG capsule   Oral   Take 300 mg by mouth 2 (two) times daily.         Marland Kitchen loratadine (CLARITIN) 10 MG tablet   Oral   Take 10 mg by mouth daily.         Marland Kitchen lovastatin (MEVACOR) 20 MG  tablet   Oral   Take 20 mg by mouth daily.         . nicotine (NICODERM CQ - DOSED IN MG/24 HOURS) 21 mg/24hr patch   Transdermal   Place 1 patch onto the skin daily.         Marland Kitchen oxybutynin (DITROPAN-XL) 10 MG 24 hr tablet   Oral   Take 10 mg by mouth daily.         . polyethylene glycol powder (GLYCOLAX/MIRALAX) powder   Oral   Take 17 g by mouth daily. Dissolved in 8 ounces of water/juice daily         . diphenoxylate-atropine (LOMOTIL) 2.5-0.025 MG per tablet   Oral   Take 1 tablet by mouth 4 (four) times daily as needed for diarrhea or loose stools.   10 tablet   0   . haloperidol decanoate (HALDOL DECANOATE) 100 MG/ML injection   Intramuscular   Inject 100 mg into the muscle every 28 (twenty-eight) days. Administered by [facility]          BP 128/62  Pulse 115  Temp(Src) 102.6 F (39.2 C) (Rectal)  Resp  18  SpO2 95% Physical Exam 1325: Physical examination:  Nursing notes reviewed; Vital signs and O2 SAT reviewed;  Constitutional: Well developed, Well nourished, Well hydrated, In no acute distress; Head:  Normocephalic, atraumatic; Eyes: EOMI, PERRL, No scleral icterus; ENMT: Mouth and pharynx normal, Mucous membranes moist; Neck: Supple, Full range of motion, No lymphadenopathy; Cardiovascular: Tachycardic rate and rhythm, No gallop; Respiratory: Breath sounds coarse & equal bilaterally, No wheezes.  Speaking full sentences with ease, Normal respiratory effort/excursion; Chest: Nontender, Movement normal; Abdomen: Soft, Nontender, Nondistended, Normal bowel sounds; Genitourinary: No CVA tenderness; Spine:  No midline CS, TS, LS tenderness.;; Extremities: Pulses normal, No tenderness, No edema, No calf edema or asymmetry.; Neuro: AA&Ox3, vague historian. +HOH per hx, otherwise major CN grossly intact.  Speech garbled per baseline. Moves all extremities on stretcher and to command without apparent gross focal motor deficits.; Skin: Color normal, Warm, Dry.   ED Course  Procedures   EKG Interpretation None      MDM  MDM Reviewed: previous chart, nursing note and vitals Reviewed previous: labs Interpretation: labs, ECG, x-ray and CT scan Total time providing critical care: 30-74 minutes. This excludes time spent performing separately reportable procedures and services. Consults: admitting MD   CRITICAL CARE Performed by: Alfonzo Feller Total critical care time: 35 Critical care time was exclusive of separately billable procedures and treating other patients. Critical care was necessary to treat or prevent imminent or life-threatening deterioration. Critical care was time spent personally by me on the following activities: development of treatment plan with patient and/or surrogate as well as nursing, discussions with consultants, evaluation of patient's response to treatment,  examination of patient, obtaining history from patient or surrogate, ordering and performing treatments and interventions, ordering and review of laboratory studies, ordering and review of radiographic studies, pulse oximetry and re-evaluation of patient's condition.    Date: 04/11/2013  Rate: 103  Rhythm: sinus tachycardia  QRS Axis: normal  Intervals: normal  ST/T Wave abnormalities: nonspecific ST/T changes  Conduction Disutrbances:nonspecific intraventricular conduction delay  Narrative Interpretation:   Old EKG Reviewed: none available.   Results for orders placed during the hospital encounter of 04/11/13  URINE RAPID DRUG SCREEN (HOSP PERFORMED)      Result Value Ref Range   Opiates NONE DETECTED  NONE DETECTED   Cocaine NONE DETECTED  NONE DETECTED  Benzodiazepines NONE DETECTED  NONE DETECTED   Amphetamines NONE DETECTED  NONE DETECTED   Tetrahydrocannabinol NONE DETECTED  NONE DETECTED   Barbiturates NONE DETECTED  NONE DETECTED  ETHANOL      Result Value Ref Range   Alcohol, Ethyl (B) <11  0 - 11 mg/dL  CBC WITH DIFFERENTIAL      Result Value Ref Range   WBC 19.1 (*) 4.0 - 10.5 K/uL   RBC 3.84 (*) 4.22 - 5.81 MIL/uL   Hemoglobin 11.4 (*) 13.0 - 17.0 g/dL   HCT 34.4 (*) 39.0 - 52.0 %   MCV 89.6  78.0 - 100.0 fL   MCH 29.7  26.0 - 34.0 pg   MCHC 33.1  30.0 - 36.0 g/dL   RDW 14.7  11.5 - 15.5 %   Platelets 110 (*) 150 - 400 K/uL   Neutrophils Relative % 93 (*) 43 - 77 %   Lymphocytes Relative 4 (*) 12 - 46 %   Monocytes Relative 3  3 - 12 %   Eosinophils Relative 0  0 - 5 %   Basophils Relative 0  0 - 1 %   Neutro Abs 17.7 (*) 1.7 - 7.7 K/uL   Lymphs Abs 0.8  0.7 - 4.0 K/uL   Monocytes Absolute 0.6  0.1 - 1.0 K/uL   Eosinophils Absolute 0.0  0.0 - 0.7 K/uL   Basophils Absolute 0.0  0.0 - 0.1 K/uL   WBC Morphology INCREASED BANDS (>20% BANDS)    COMPREHENSIVE METABOLIC PANEL      Result Value Ref Range   Sodium 139  137 - 147 mEq/L   Potassium 4.1  3.7 - 5.3  mEq/L   Chloride 101  96 - 112 mEq/L   CO2 26  19 - 32 mEq/L   Glucose, Bld 192 (*) 70 - 99 mg/dL   BUN 20  6 - 23 mg/dL   Creatinine, Ser 1.23  0.50 - 1.35 mg/dL   Calcium 9.7  8.4 - 10.5 mg/dL   Total Protein 7.4  6.0 - 8.3 g/dL   Albumin 3.5  3.5 - 5.2 g/dL   AST 13  0 - 37 U/L   ALT 7  0 - 53 U/L   Alkaline Phosphatase 55  39 - 117 U/L   Total Bilirubin 0.4  0.3 - 1.2 mg/dL   GFR calc non Af Amer 64 (*) >90 mL/min   GFR calc Af Amer 74 (*) >90 mL/min  LIPASE, BLOOD      Result Value Ref Range   Lipase 12  11 - 59 U/L  TROPONIN I      Result Value Ref Range   Troponin I <0.30  <0.30 ng/mL  URINALYSIS, ROUTINE W REFLEX MICROSCOPIC      Result Value Ref Range   Color, Urine YELLOW  YELLOW   APPearance CLEAR  CLEAR   Specific Gravity, Urine 1.010  1.005 - 1.030   pH 6.5  5.0 - 8.0   Glucose, UA NEGATIVE  NEGATIVE mg/dL   Hgb urine dipstick NEGATIVE  NEGATIVE   Bilirubin Urine NEGATIVE  NEGATIVE   Ketones, ur NEGATIVE  NEGATIVE mg/dL   Protein, ur NEGATIVE  NEGATIVE mg/dL   Urobilinogen, UA 0.2  0.0 - 1.0 mg/dL   Nitrite NEGATIVE  NEGATIVE   Leukocytes, UA NEGATIVE  NEGATIVE   Dg Chest 1 View 04/11/2013   CLINICAL DATA:  Patient fell at [facility]. Found at the [facility]. Incontinent.  EXAM: CHEST - 1 VIEW  COMPARISON:  None.  FINDINGS: The heart is mildly enlarged. There is patchy density at the left lung base, a consistent with infectious infiltrate and possibly representing aspiration pneumonia in light of the history. No pulmonary edema identified.  IMPRESSION: Left lower lobe infiltrate, possibly related to aspiration.   Electronically Signed   By: Shon Hale M.D.   On: 04/11/2013 14:17   Ct Head Wo Contrast 04/11/2013   CLINICAL DATA:  [place], altered level of consciousness  EXAM: CT HEAD WITHOUT CONTRAST  TECHNIQUE: Contiguous axial images were obtained from the base of the skull through the vertex without intravenous contrast.  COMPARISON:  01/31/2004  FINDINGS:  Atherosclerotic and physiologic intracranial calcifications. 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  There is no evidence of acute intracranial hemorrhage, brain edema, mass lesion, acute infarction, mass effect, or midline shift. Acute infarct may be inapparent on noncontrast CT. No other intra-axial abnormalities are seen, and the ventricles and sulci are within normal limits in size and symmetry. No abnormal extra-axial fluid collections or masses are identified. No significant calvarial abnormality.  IMPRESSION: 1. Negative for bleed or other acute intracranial process. 2. 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   Electronically Signed   By: Arne Cleveland M.D.   On: 04/11/2013 14:30    1530:  Pt poor historian; unclear events PTA. Workup reveals sepsis, likely aspiration pneumonia. Will continue IVF resuscitation, place 2nd IV site, dose APAP for fever, add lactic acid to labs. Will tx likely aspiration pneumonia with IV abx. Platelets low, but per pt's baseline. T/C to [facility] Dr. Clementeen Graham, case discussed, including:  HPI, pertinent PM/SHx, VS/PE, dx testing, ED course and treatment:  Agreeable to come to ED for eval for admission, requests to add [facility] x2, obtain MRI brain, obtain stepdown bed to Dr. Luan Pulling' service.   Alfonzo Feller, DO 04/14/13 1248

## 2013-04-11 NOTE — Progress Notes (Signed)
ANTIBIOTIC CONSULT NOTE - INITIAL  Pharmacy Consult for Vancomycin & Zosyn Indication: Sepsis, Pneumonia  No Known Allergies  Patient Measurements:   Last Recorded Body Weight: 59.4kg on 09/29/12  Vital Signs: Temp: 102.6 F (39.2 C) (04/03 1506) Temp src: Rectal (04/03 1506) BP: 128/62 mmHg (04/03 1506) Pulse Rate: 115 (04/03 1506) Intake/Output from previous day:   Intake/Output from this shift: Total I/O In: -  Out: 15 [Urine:15]  Labs:  Recent Labs  04/11/13 1355  WBC 19.1*  HGB 11.4*  PLT 110*  CREATININE 1.23   The CrCl is unknown because both a height and weight (above a minimum accepted value) are required for this calculation. No results found for this basename: VANCOTROUGH, VANCOPEAK, VANCORANDOM, GENTTROUGH, GENTPEAK, GENTRANDOM, TOBRATROUGH, TOBRAPEAK, TOBRARND, AMIKACINPEAK, AMIKACINTROU, AMIKACIN,  in the last 72 hours   Microbiology: No results found for this or any previous visit (from the past 720 hour(s)).  Medical History: Past Medical History  Diagnosis Date  . Schizophrenia   . Hernia   . Thyroid disease   . High cholesterol   . HOH (hard of hearing)     Medications:  Scheduled:    Assessment: 58 yo M who lives in a group home presents today after witnessed syncopal episode.  He is febrile on admission with elevated WBC, hypotension, & tachycardia.  CXR + for pneumonia.   Scr is slightly elevated from patient's baseline.  Estimated CrCl ~60-65 ml/min.  Vancomycin 4/3>> Zosyn 4/3>>  Goal of Therapy:  Vancomycin trough level 15-20 mcg/ml  Plan:  Zosyn 3.375gm IV Q8h to be infused over 4hrs Vancomycin 1gm IV x1 now then 750mg  IV q12h Check Vancomycin trough at steady state Monitor renal function and cx data   Biagio Borg 04/11/2013,4:48 PM

## 2013-04-11 NOTE — H&P (Addendum)
[facility] History and Physical  Steven David WUJ:[id number] DOB: November 30, 1955 DOA: 04/11/2013  Referring physician:Dr Mcmannus PCP: Alonza Bogus, MD   Chief Complaint:  Syncope x 1 day  History provided by ED physician and Ms De Blanch administrator  [facility].  HPI:  58 y/o schizophrenic male from [facility],  with hx of ?stroke ( never documented),  schizophrenia, hypothyroidism, HL, was at a restaurant when he had a brief syncopal episode. As per ED ,the bystanders witnessed patient to have urinary incontinence and an episode of vomiting. Patient is unable to provide much history. He was brought to the hospital, by EMS. Patient on arrival was found to have BP of 73/52 mmhg , tachycardic upto 120 and febrile to 102.47F. He also had a wbc of 19.1, meeting criteria for sepsis. Hb was 11.4 and platelets of 110. Patient denies headache, dizziness, fever, chills, nausea , vomiting, chest pain, palpitations, SOB, abdominal pain, bowel or urinary symptoms. Denies change in weight or appetite. Labs from 9/14 showed hb of 12.5 and platelets of 104. Patient did not have any neurological impairment. He has baseline speech impairment and ambulates with a cane. He takes a regular diet and has some family who live in different city. . The administrator at group home is unaware about any hx of stroke.   A CXR in ED showed left lower lobe infiltrate. 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 Patient given 1 dose IV zosyn . [facility] for admission to stepdown.    Review of Systems:  As outlined in HPI. History limited due to patient's impaired speech and being a poor historian  Past Medical History  Diagnosis Date  . Schizophrenia   . Hernia   . Thyroid disease   . High cholesterol   . HOH (hard of hearing)    Past Surgical History  Procedure Laterality Date  . Leg surgery    . Testicle surgery     Social History:  reports that he has  quit smoking. He does not have any smokeless tobacco history on file. He reports that he does not drink alcohol or use illicit drugs.  No Known Allergies  No family history on file.  Prior to Admission medications   Medication Sig Start Date End Date Taking? Authorizing Provider  docusate sodium (COLACE) 100 MG capsule Take 100 mg by mouth at bedtime as needed for mild constipation.   Yes Historical Provider, MD  fish oil-omega-3 fatty acids 1000 MG capsule Take 1 g by mouth 3 (three) times daily.   Yes Historical Provider, MD  guaifenesin (ROBITUSSIN) 100 MG/5ML syrup Take 200 mg by mouth every 4 (four) hours as needed for cough.   Yes Historical Provider, MD  haloperidol (HALDOL) 10 MG tablet Take 10 mg by mouth every evening.   Yes Historical Provider, MD  levothyroxine (SYNTHROID, LEVOTHROID) 100 MCG tablet Take 100 mcg by mouth daily before breakfast.   Yes Historical Provider, MD  lithium carbonate 300 MG capsule Take 300 mg by mouth 2 (two) times daily.   Yes Historical Provider, MD  loratadine (CLARITIN) 10 MG tablet Take 10 mg by mouth daily.   Yes Historical Provider, MD  lovastatin (MEVACOR) 20 MG tablet Take 20 mg by mouth daily.   Yes Historical Provider, MD  nicotine (NICODERM CQ - DOSED IN MG/24 HOURS) 21 mg/24hr patch Place 1 patch onto the skin daily.   Yes Historical Provider, MD  oxybutynin (DITROPAN-XL) 10 MG 24 hr  tablet Take 10 mg by mouth daily.   Yes Historical Provider, MD  polyethylene glycol powder (GLYCOLAX/MIRALAX) powder Take 17 g by mouth daily. Dissolved in 8 ounces of water/juice daily   Yes Historical Provider, MD  diphenoxylate-atropine (LOMOTIL) 2.5-0.025 MG per tablet Take 1 tablet by mouth 4 (four) times daily as needed for diarrhea or loose stools. 09/29/12   Maudry Diego, MD  haloperidol decanoate (HALDOL DECANOATE) 100 MG/ML injection Inject 100 mg into the muscle every 28 (twenty-eight) days. Administered by [facility]    Historical Provider, MD      Physical Exam:  Filed Vitals:   04/11/13 1345 04/11/13 1500 04/11/13 1502 04/11/13 1506  BP: 102/55 130/75 73/52 128/62  Pulse: 109 112 120 115  Temp:    102.6 F (39.2 C)  TempSrc:    Rectal  Resp:    18  SpO2: 91%   95%    Constitutional: Vital signs reviewed.  Patient is a thin built male in no acute distress. HEENT: no pallor, no icterus, moist oral mucosa, no cervical lymphadenopathy Cardiovascular: RRR, S1 normal, S2 normal, no MRG Chest: CTAB, scattered rhonchi  Abdominal: Soft. Non-tender, no wheezes, rales, or rhonchi, non-distended, bowel sounds are normal, no masses, organomegaly, or guarding present.  GU: no CVA tenderness Ext: warm, no edema Neurological: A&O x3, non focal  Labs on Admission:  Basic Metabolic Panel:  Recent Labs Lab 04/11/13 1355  NA 139  K 4.1  CL 101  CO2 26  GLUCOSE 192*  BUN 20  CREATININE 1.23  CALCIUM 9.7   Liver Function Tests:  Recent Labs Lab 04/11/13 1355  AST 13  ALT 7  ALKPHOS 55  BILITOT 0.4  PROT 7.4  ALBUMIN 3.5    Recent Labs Lab 04/11/13 1355  LIPASE 12   No results found for this basename: AMMONIA,  in the last 168 hours CBC:  Recent Labs Lab 04/11/13 1355  WBC 19.1*  NEUTROABS 17.7*  HGB 11.4*  HCT 34.4*  MCV 89.6  PLT 110*   Cardiac Enzymes:  Recent Labs Lab 04/11/13 1355  TROPONINI <0.30   BNP: No components found with this basename: POCBNP,  CBG: No results found for this basename: GLUCAP,  in the last 168 hours  Radiological Exams on Admission: Dg Chest 1 View  04/11/2013   CLINICAL DATA:  Patient fell at [employer]. Found at the [place]. Incontinent.  EXAM: CHEST - 1 VIEW  COMPARISON:  None.  FINDINGS: The heart is mildly enlarged. There is patchy density at the left lung base, a consistent with infectious infiltrate and possibly representing aspiration pneumonia in light of the history. No pulmonary edema identified.  IMPRESSION: Left lower lobe infiltrate, possibly  related to aspiration.   Electronically Signed   By: Shon Hale M.D.   On: 04/11/2013 14:17   Ct Head Wo Contrast  04/11/2013   CLINICAL DATA:  [place], altered level of consciousness  EXAM: CT HEAD WITHOUT CONTRAST  TECHNIQUE: Contiguous axial images were obtained from the base of the skull through the vertex without intravenous contrast.  COMPARISON:  01/31/2004  FINDINGS: Atherosclerotic and physiologic intracranial calcifications. 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  There is no evidence of acute intracranial hemorrhage, brain edema, mass lesion, acute infarction, mass effect, or midline shift. Acute infarct may be inapparent on noncontrast CT. No other intra-axial abnormalities are seen, and the ventricles and sulci are within normal limits in size and symmetry. No abnormal extra-axial fluid collections or masses are  identified. No significant calvarial abnormality.  IMPRESSION: 1. Negative for bleed or other acute intracranial process. 2. 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   Electronically Signed   By: Arne Cleveland M.D.   On: 04/11/2013 14:30    EKG: pending  Assessment/Plan  Principal Problem:   Sepsis possibly secondary to pneumonia seen on CXR.  Admit to stepdown  IV fluids with NS  follow blood cx, urine for strep ag , legionella ag.. Supportive care with tylenol. Antiemetics and IV fluids Check lactic acid  monitor labs in am  Active Problems:  Community acquired vs aspiration pneumonia -start empiric vancomycin and zosyn. dosing per pharmacy -Follow blood cx.  urine for strep and legionella.  -02 via [place] prn. Aspiration precaution. Swallow evaluation. -albuterol / atroven nebs prn   Syncope ? Possibly due to sepsis vs seizure Continue neuro check q 4hr. aspiration and seizure precaution. Head CT reviewed. Will obtain MRI brain and EEG. Check lithium level ASA suppository.c heck lipid panel, TSH and a1c PT/OT eval  bedside swallow eval Keep  NPO for now. Check orthostatic vitals Will obtain 2D echo D/w neurology consult Dr Nicole Kindred who suggests symptoms may be due to infection alone and recommends to monitor patient there.       H/O stroke  No documentation in system.follow MRI  continue  ASA and statin    Hypothyroidism continue synthroid. Check TSH    Thrombocytopenia Monitor closely    Schizophrenia Resume lithium and haldol . Check level    Malnutrition Will obtain nutrition consult  Tobacco abuse  reports quitting. continue nicotine patch     Diet:NPO for now  DVT prophylaxis: sq lovenox, monitor platelets   Code Status:full code Family Communication: None at bedside Disposition Plan:return to group home once stable  Steven David, [place] [facility] Pager [phone]  Total time spent on admission :70 minutes  If 7PM-7AM, please contact night-coverage [email or web address] Password [email or web address] 04/11/2013, 4:19 PM

## 2013-04-12 DIAGNOSIS — I369 Nonrheumatic tricuspid valve disorder, unspecified: Secondary | ICD-10-CM

## 2013-04-12 LAB — TSH: TSH: 7.31 u[IU]/mL — ABNORMAL HIGH (ref 0.350–4.500)

## 2013-04-12 LAB — CBC
HCT: 31.8 % — ABNORMAL LOW (ref 39.0–52.0)
HEMOGLOBIN: 10.4 g/dL — AB (ref 13.0–17.0)
MCH: 29.4 pg (ref 26.0–34.0)
MCHC: 32.7 g/dL (ref 30.0–36.0)
MCV: 89.8 fL (ref 78.0–100.0)
PLATELETS: 96 10*3/uL — AB (ref 150–400)
RBC: 3.54 MIL/uL — AB (ref 4.22–5.81)
RDW: 14.7 % (ref 11.5–15.5)
WBC: 19.7 10*3/uL — ABNORMAL HIGH (ref 4.0–10.5)

## 2013-04-12 LAB — LIPID PANEL
Cholesterol: 95 mg/dL (ref 0–200)
HDL: 37 mg/dL — AB (ref >39)
LDL Cholesterol: 48 mg/dL (ref 0–99)
TRIGLYCERIDES: 52 mg/dL (ref <150)
Total CHOL/HDL Ratio: 2.6 RATIO
VLDL: 10 mg/dL (ref 0–40)

## 2013-04-12 LAB — BASIC METABOLIC PANEL
BUN: 17 mg/dL (ref 6–23)
CO2: 25 meq/L (ref 19–32)
CREATININE: 1.06 mg/dL (ref 0.50–1.35)
Calcium: 9.2 mg/dL (ref 8.4–10.5)
Chloride: 105 mEq/L (ref 96–112)
GFR calc Af Amer: 89 mL/min — ABNORMAL LOW (ref >90)
GFR, EST NON AFRICAN AMERICAN: 77 mL/min — AB (ref >90)
GLUCOSE: 103 mg/dL — AB (ref 70–99)
Potassium: 4 mEq/L (ref 3.7–5.3)
SODIUM: 143 meq/L (ref 137–147)

## 2013-04-12 LAB — MRSA PCR SCREENING: MRSA by PCR: NEGATIVE

## 2013-04-12 LAB — HEMOGLOBIN A1C
Hgb A1c MFr Bld: 5.6 % (ref <5.7)
MEAN PLASMA GLUCOSE: 114 mg/dL (ref <117)

## 2013-04-12 LAB — STREP PNEUMONIAE URINARY ANTIGEN: Strep Pneumo Urinary Antigen: NEGATIVE

## 2013-04-12 MED ORDER — POLYETHYLENE GLYCOL 3350 17 G PO PACK
17.0000 g | PACK | Freq: Every day | ORAL | Status: DC
  Administered 2013-04-12 – 2013-04-16 (×5): 17 g via ORAL
  Filled 2013-04-12 (×5): qty 1

## 2013-04-12 MED ORDER — ASPIRIN EC 325 MG PO TBEC
325.0000 mg | DELAYED_RELEASE_TABLET | Freq: Every day | ORAL | Status: DC
  Administered 2013-04-12 – 2013-04-16 (×5): 325 mg via ORAL
  Filled 2013-04-12 (×5): qty 1

## 2013-04-12 NOTE — Progress Notes (Signed)
Subjective: He was admitted yesterday with pneumonia and sepsis. He has improved somewhat. At baseline he has mental status changes  Objective: Vital signs in last 24 hours: Temp:  [98 F (36.7 C)-102.6 F (39.2 C)] 98.4 F (36.9 C) (04/04 0800) Pulse Rate:  [60-120] 79 (04/04 0800) Resp:  [13-25] 14 (04/04 0800) BP: (73-137)/(37-110) 86/52 mmHg (04/04 0800) SpO2:  [90 %-100 %] 96 % (04/04 0800) Weight:  [50.4 kg (111 lb 1.8 oz)-50.9 kg (112 lb 3.4 oz)] 50.9 kg (112 lb 3.4 oz) (04/04 0500) Weight change:     Intake/Output from previous day: 04/03 0701 - 04/04 0700 In: 1350 [I.V.:1000; IV Piggyback:350] Out: 315 [Urine:315]  PHYSICAL EXAM General appearance: alert and no distress Resp: rhonchi bilaterally Cardio: regular rate and rhythm, S1, S2 normal, no murmur, click, rub or gallop GI: soft, non-tender; bowel sounds normal; no masses,  no organomegaly Extremities: extremities normal, atraumatic, no cyanosis or edema  Lab Results:    Basic Metabolic Panel:  Recent Labs  04/11/13 1355 04/12/13 0534  NA 139 143  K 4.1 4.0  CL 101 105  CO2 26 25  GLUCOSE 192* 103*  BUN 20 17  CREATININE 1.23 1.06  CALCIUM 9.7 9.2   Liver Function Tests:  Recent Labs  04/11/13 1355  AST 13  ALT 7  ALKPHOS 55  BILITOT 0.4  PROT 7.4  ALBUMIN 3.5    Recent Labs  04/11/13 1355  LIPASE 12   No results found for this basename: AMMONIA,  in the last 72 hours CBC:  Recent Labs  04/11/13 1355 04/12/13 0534  WBC 19.1* 19.7*  NEUTROABS 17.7*  --   HGB 11.4* 10.4*  HCT 34.4* 31.8*  MCV 89.6 89.8  PLT 110* 96*   Cardiac Enzymes:  Recent Labs  04/11/13 1355  TROPONINI <0.30   BNP: No results found for this basename: PROBNP,  in the last 72 hours D-Dimer: No results found for this basename: DDIMER,  in the last 72 hours CBG: No results found for this basename: GLUCAP,  in the last 72 hours Hemoglobin A1C: No results found for this basename: HGBA1C,  in the  last 72 hours Fasting Lipid Panel:  Recent Labs  04/12/13 0535  CHOL 95  HDL 37*  LDLCALC 48  TRIG 52  CHOLHDL 2.6   Thyroid Function Tests: No results found for this basename: TSH, T4TOTAL, FREET4, T3FREE, THYROIDAB,  in the last 72 hours Anemia Panel: No results found for this basename: VITAMINB12, FOLATE, FERRITIN, TIBC, IRON, RETICCTPCT,  in the last 72 hours Coagulation: No results found for this basename: LABPROT, INR,  in the last 72 hours Urine Drug Screen: Drugs of Abuse     Component Value Date/Time   LABOPIA NONE DETECTED 04/11/2013 1502   COCAINSCRNUR NONE DETECTED 04/11/2013 1502   LABBENZ NONE DETECTED 04/11/2013 1502   AMPHETMU NONE DETECTED 04/11/2013 1502   THCU NONE DETECTED 04/11/2013 1502   LABBARB NONE DETECTED 04/11/2013 1502    Alcohol Level:  Recent Labs  04/11/13 1355  ETH <11   Urinalysis:  Recent Labs  04/11/13 1502  COLORURINE YELLOW  LABSPEC 1.010  PHURINE 6.5  GLUCOSEU NEGATIVE  HGBUR NEGATIVE  BILIRUBINUR NEGATIVE  KETONESUR NEGATIVE  PROTEINUR NEGATIVE  UROBILINOGEN 0.2  NITRITE NEGATIVE  LEUKOCYTESUR NEGATIVE   Misc. Labs:  ABGS No results found for this basename: PHART, PCO2, PO2ART, TCO2, HCO3,  in the last 72 hours CULTURES Recent Results (from the past 240 hour(s))  CULTURE, BLOOD (ROUTINE X  2)     Status: None   Collection Time    04/11/13  4:25 PM      Result Value Ref Range Status   Specimen Description LEFT ANTECUBITAL   Final   Special Requests  BAA 6 CC EACH   Final   Culture NO GROWTH 1 DAY   Final   Report Status PENDING   Incomplete  CULTURE, BLOOD (ROUTINE X 2)     Status: None   Collection Time    04/11/13  4:25 PM      Result Value Ref Range Status   Specimen Description BLOOD RIGHT ARM   Final   Special Requests AEB 4 CC, ANA 2 CC   Final   Culture NO GROWTH 1 DAY   Final   Report Status PENDING   Incomplete  MRSA PCR SCREENING     Status: None   Collection Time    04/12/13 12:30 AM      Result Value  Ref Range Status   MRSA by PCR NEGATIVE  NEGATIVE Final   Comment:            The GeneXpert MRSA Assay (FDA     approved for NASAL specimens     only), is one component of a     comprehensive MRSA colonization     surveillance program. It is not     intended to diagnose MRSA     infection nor to guide or     monitor treatment for     MRSA infections.   Studies/Results: Dg Chest 1 View  04/11/2013   CLINICAL DATA:  Patient fell at [employer]. Found at the [facility]. Incontinent.  EXAM: CHEST - 1 VIEW  COMPARISON:  None.  FINDINGS: The heart is mildly enlarged. There is patchy density at the left lung base, a consistent with infectious infiltrate and possibly representing aspiration pneumonia in light of the history. No pulmonary edema identified.  IMPRESSION: Left lower lobe infiltrate, possibly related to aspiration.   Electronically Signed   By: Shon Hale M.D.   On: 04/11/2013 14:17   Ct Head Wo Contrast  04/11/2013   CLINICAL DATA:  [place], altered level of consciousness  EXAM: CT HEAD WITHOUT CONTRAST  TECHNIQUE: Contiguous axial images were obtained from the base of the skull through the vertex without intravenous contrast.  COMPARISON:  01/31/2004  FINDINGS: Atherosclerotic and physiologic intracranial calcifications. 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  There is no evidence of acute intracranial hemorrhage, brain edema, mass lesion, acute infarction, mass effect, or midline shift. Acute infarct may be inapparent on noncontrast CT. No other intra-axial abnormalities are seen, and the ventricles and sulci are within normal limits in size and symmetry. No abnormal extra-axial fluid collections or masses are identified. No significant calvarial abnormality.  IMPRESSION: 1. Negative for bleed or other acute intracranial process. 2. 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   Electronically Signed   By: Arne Cleveland M.D.   On: 04/11/2013 14:30   Mr Brain Wo  Contrast  04/11/2013   CLINICAL DATA:  Syncopal event.  History of strokes.  EXAM: MRI HEAD WITHOUT CONTRAST  TECHNIQUE: Multiplanar, multiecho pulse sequences of the brain and surrounding structures were obtained without intravenous contrast.  COMPARISON:  CT HEAD W/O CM dated 04/11/2013  FINDINGS: The patient was unable to remain motionless for the exam. Small or subtle lesions could be overlooked.  No acute stroke or hemorrhage. No mass lesion or hydrocephalus. No extra-axial  fluid.  Premature for age cerebral atrophy. Mild to moderate subcortical and periventricular white matter signal abnormality, likely chronic microvascular ischemic change. Remote bilateral cerebellar infarcts. Large remote left caudate infarct.  Pituitary, pineal, and cerebellar tonsils unremarkable. No upper cervical lesions. Flow voids are maintained in the carotid, basilar, and vertebral arteries. Mild pannus. A small area of remote hemorrhage is seen a chronic right medial cerebellar infarct. Dysconjugate gaze. No sinus disease. Bilateral dependent mastoid fluid, and nonaggressive. Falx cerebral I ossification.  IMPRESSION: Premature atrophy, small vessel disease, and evidence for remote cerebral infarction. No acute intracranial findings.   Electronically Signed   By: Rolla Flatten M.D.   On: 04/11/2013 19:07    Medications:  Prior to Admission:  Prescriptions prior to admission  Medication Sig Dispense Refill  . docusate sodium (COLACE) 100 MG capsule Take 100 mg by mouth at bedtime as needed for mild constipation.      . fish oil-omega-3 fatty acids 1000 MG capsule Take 1 g by mouth 3 (three) times daily.      Marland Kitchen guaifenesin (ROBITUSSIN) 100 MG/5ML syrup Take 200 mg by mouth every 4 (four) hours as needed for cough.      . haloperidol (HALDOL) 10 MG tablet Take 10 mg by mouth every evening.      Marland Kitchen levothyroxine (SYNTHROID, LEVOTHROID) 100 MCG tablet Take 100 mcg by mouth daily before breakfast.      . lithium carbonate 300 MG  capsule Take 300 mg by mouth 2 (two) times daily.      Marland Kitchen loratadine (CLARITIN) 10 MG tablet Take 10 mg by mouth daily.      Marland Kitchen lovastatin (MEVACOR) 20 MG tablet Take 20 mg by mouth daily.      . nicotine (NICODERM CQ - DOSED IN MG/24 HOURS) 21 mg/24hr patch Place 1 patch onto the skin daily.      Marland Kitchen oxybutynin (DITROPAN-XL) 10 MG 24 hr tablet Take 10 mg by mouth daily.      . polyethylene glycol powder (GLYCOLAX/MIRALAX) powder Take 17 g by mouth daily. Dissolved in 8 ounces of water/juice daily      . diphenoxylate-atropine (LOMOTIL) 2.5-0.025 MG per tablet Take 1 tablet by mouth 4 (four) times daily as needed for diarrhea or loose stools.  10 tablet  0  . haloperidol decanoate (HALDOL DECANOATE) 100 MG/ML injection Inject 100 mg into the muscle every 28 (twenty-eight) days. Administered by [facility]       Scheduled: . antiseptic oral rinse  15 mL Mouth Rinse BID  . aspirin  300 mg Rectal Daily  . enoxaparin (LOVENOX) injection  40 mg Subcutaneous Q24H  . haloperidol  10 mg Oral QPM  . levothyroxine  100 mcg Oral QAC breakfast  . lithium carbonate  300 mg Oral BID  . loratadine  10 mg Oral Daily  . nicotine  21 mg Transdermal Daily  . omega-3 acid ethyl esters  1 g Oral TID  . oxybutynin  10 mg Oral Daily  . piperacillin-tazobactam (ZOSYN)  IV  3.375 g Intravenous 3 times per day  . polyethylene glycol  17 g Oral Daily  . simvastatin  10 mg Oral q1800  . sodium chloride  3 mL Intravenous Q12H  . vancomycin  750 mg Intravenous Q12H   Continuous: . sodium chloride     [id number], acetaminophen, diphenoxylate-atropine, docusate sodium, guaiFENesin, ipratropium-albuterol, ondansetron (ZOFRAN) IV, ondansetron  Assesment: He has pneumonia. He is septic from that. This may well be aspiration in nature. He has  other chronic problems including a previous stroke which is stable from a cytopenia and malnutrition Principal Problem:   Sepsis Active Problems:   H/O stroke within last year    Hypothyroidism   Pneumonia   Thrombocytopenia   Schizophrenia   Malnutrition   SIRS (systemic inflammatory response syndrome)    Plan: Continue with current treatments    LOS: 1 day   Shandon Burlingame L 04/12/2013, 9:17 AM

## 2013-04-12 NOTE — Progress Notes (Signed)
INITIAL NUTRITION ASSESSMENT  DOCUMENTATION CODES Per approved criteria  -Malnutrition in the context of chronic illness -Underweight   INTERVENTION: If pt able to resume oral intake recommend high calorie soft diet with frequent intake. If he is not appropriate for diet advancement at this time recommend: initiate enteral nutrition to meet estimated nutrition needs. -Provide continuous Osmolite 1.2 @ 40 ml/hr via NGT and increase by 10 ml every 4 hours to goal rate of 76 ml/hr. At goal rate, tube feeding regimen will provide 2188 kcal, 101 grams of protein, and  1508 ml of H2O.   NUTRITION DIAGNOSIS: Malnutrition related to inadequate oral intake as evidenced by unexplained severe weight loss 19#, 14.6% <7 months.                .   Goal: Pt will meet >90% of estimated energy, protein and fluid daily to meet estimated needs.  Monitor:  Diet advancement vs initiation of enteral nutrition, labs and weight changes.  Reason for Assessment: Consult to evaluate nutrition requirements and status  58 y.o. male  Admitting Dx: Sepsis  ASSESSMENT: Pt is 58 yo male who is HOH. He lives at [facility]. His problems  Includes Sepsis, PNA, Schizophrenia, Malnutrition and stroke less than year ago.  -Pt had CT Chest, abdomen and pelvis 09/19/12 secondary to unplanned wt loss. No acute findings.  -Pt has unplanned wt loss of 19#, 14.6% <17months which is severe.  -Limited hx available due to pt altered mental status.  Pt meets criteria for severe MALNUTRITION in the context of chronic illness as evidenced by unintentional weight loss >10% <7 mo and muscle and fat loss  Nutrition Focused Physical Exam:  Subcutaneous Fat:   Orbital Region: mild depletion Upper Arm Region: moderate depletion Thoracic and Lumbar Region:  N/A  Muscle:  Temple Region: mild-moderate depletion Clavicle Bone Region: severe depletion Clavicle and Acromion Bone Region: moderate depletion Scapular Bone Region:  N/A Dorsal Hand: moderate depletion Patellar Region: mild depletion Anterior Thigh Region: mild-moderate depletion Posterior Calf Region: mild depletion  Edema: none    Height: Ht Readings from Last 1 Encounters:  04/11/13 5\' 9"  (1.753 m)    Weight: Wt Readings from Last 1 Encounters:  04/12/13 112 lb 3.4 oz (50.9 kg)    Ideal Body Weight: 160# (72.7 kg)  % Ideal Body Weight: 70%  Wt Readings from Last 10 Encounters:  04/12/13 112 lb 3.4 oz (50.9 kg)  09/29/12 131 lb (59.421 kg)    Usual Body Weight: 131#  % Usual Body Weight: 85%  BMI:  Body mass index is 16.56 kg/(m^2).underweight  Estimated Nutritional Needs: Kcal: 2200/d for weight gain  Protein: 90-100 gr Fluid: >1800 ml/day  Skin: intact  Diet Order: NPO  EDUCATION NEEDS: -Education not appropriate at this time   Intake/Output Summary (Last 24 hours) at 04/12/13 1022 Last data filed at 04/12/13 0602  Gross per 24 hour  Intake   1350 ml  Output    315 ml  Net   1035 ml    Last BM: PTA  Labs:   Recent Labs Lab 04/11/13 1355 04/12/13 0534  NA 139 143  K 4.1 4.0  CL 101 105  CO2 26 25  BUN 20 17  CREATININE 1.23 1.06  CALCIUM 9.7 9.2  GLUCOSE 192* 103*    CBG (last 3)  No results found for this basename: GLUCAP,  in the last 72 hours  Scheduled Meds: . antiseptic oral rinse  15 mL Mouth Rinse BID  .  aspirin EC  325 mg Oral Daily  . enoxaparin (LOVENOX) injection  40 mg Subcutaneous Q24H  . haloperidol  10 mg Oral QPM  . levothyroxine  100 mcg Oral QAC breakfast  . lithium carbonate  300 mg Oral BID  . loratadine  10 mg Oral Daily  . nicotine  21 mg Transdermal Daily  . omega-3 acid ethyl esters  1 g Oral TID  . oxybutynin  10 mg Oral Daily  . piperacillin-tazobactam (ZOSYN)  IV  3.375 g Intravenous 3 times per day  . polyethylene glycol  17 g Oral Daily  . simvastatin  10 mg Oral q1800  . sodium chloride  3 mL Intravenous Q12H  . vancomycin  750 mg Intravenous Q12H     Continuous Infusions: . sodium chloride      Past Medical History  Diagnosis Date  . Schizophrenia   . Hernia   . Thyroid disease   . High cholesterol   . HOH (hard of hearing)     Past Surgical History  Procedure Laterality Date  . Leg surgery    . Testicle surgery     Colman Cater MS,RD,CSG,LDN Office: #[phone] Pager: [phone]

## 2013-04-13 DIAGNOSIS — E43 Unspecified severe protein-calorie malnutrition: Secondary | ICD-10-CM | POA: Insufficient documentation

## 2013-04-13 LAB — LEGIONELLA ANTIGEN, URINE: LEGIONELLA ANTIGEN, URINE: NEGATIVE

## 2013-04-13 NOTE — Progress Notes (Signed)
After ambulating patient around room, checked placement of condom catheter, patients inguinal hernia noted to be very large in compairison to AM assessment, approximately the size of a soccer ball. Dr. Willey Blade notified by myself and [facility], W.Jarrell,RN. Order given to consult Dr. Arnoldo Morale. MD notified.

## 2013-04-13 NOTE — Consult Note (Signed)
Reason for Consult: Left inguinal hernia Referring Physician: Dr. Liliana Cline is an 58 y.o. male.  HPI: Patient is a 58 year old white male who was admitted to the intensive care unit for sepsis protocol due to left lower lobe pneumonia. When the nursing staff start ambulate him, they noticed a very large left inguinal hernia. Patient denies any abdominal pain. A surgery consultation was obtained. As I understand it, he has had a left inguinal hernia for some time now.  Past Medical History  Diagnosis Date  . Schizophrenia   . Hernia   . Thyroid disease   . High cholesterol   . HOH (hard of hearing)     Past Surgical History  Procedure Laterality Date  . Leg surgery    . Testicle surgery      History reviewed. No pertinent family history.  Social History:  reports that he has quit smoking. He does not have any smokeless tobacco history on file. He reports that he does not drink alcohol or use illicit drugs.  Allergies: No Known Allergies  Medications: I have reviewed the patient's current medications.  Results for orders placed during the hospital encounter of 04/11/13 (from the past 48 hour(s))  LACTIC ACID, PLASMA     Status: None   Collection Time    04/11/13  4:25 PM      Result Value Ref Range   Lactic Acid, Venous 1.3  0.5 - 2.2 mmol/L  CULTURE, BLOOD (ROUTINE X 2)     Status: None   Collection Time    04/11/13  4:25 PM      Result Value Ref Range   Specimen Description LEFT ANTECUBITAL     Special Requests  BAA 6 CC EACH     Culture NO GROWTH 2 DAYS     Report Status PENDING    CULTURE, BLOOD (ROUTINE X 2)     Status: None   Collection Time    04/11/13  4:25 PM      Result Value Ref Range   Specimen Description BLOOD RIGHT ARM     Special Requests AEB 4 CC, ANA 2 CC     Culture NO GROWTH 2 DAYS     Report Status PENDING    LITHIUM LEVEL     Status: None   Collection Time    04/11/13  4:25 PM      Result Value Ref Range   Lithium Lvl 0.80   0.80 - 1.40 mEq/L  STREP PNEUMONIAE URINARY ANTIGEN     Status: None   Collection Time    04/12/13 12:25 AM      Result Value Ref Range   Strep Pneumo Urinary Antigen NEGATIVE  NEGATIVE   Comment: PERFORMED AT [facility]                Infection due to S. pneumoniae     cannot be absolutely ruled out     since the antigen present     may be below the detection limit     of the test.     Performed at [place], URINE     Status: None   Collection Time    04/12/13 12:25 AM      Result Value Ref Range   Specimen Description URINE, RANDOM     Special Requests NONE     Legionella Antigen, Urine       Value: Negative for Legionella pneumophilia serogroup 1  Performed at [facility]   Report Status 04/13/2013 FINAL    MRSA PCR SCREENING     Status: None   Collection Time    04/12/13 12:30 AM      Result Value Ref Range   MRSA by PCR NEGATIVE  NEGATIVE   Comment:            The GeneXpert MRSA Assay (FDA     approved for NASAL specimens     only), is one component of a     comprehensive MRSA colonization     surveillance program. It is not     intended to diagnose MRSA     infection nor to guide or     monitor treatment for     MRSA infections.  CBC     Status: Abnormal   Collection Time    04/12/13  5:34 AM      Result Value Ref Range   WBC 19.7 (*) 4.0 - 10.5 K/uL   RBC 3.54 (*) 4.22 - 5.81 MIL/uL   Hemoglobin 10.4 (*) 13.0 - 17.0 g/dL   HCT 31.8 (*) 39.0 - 52.0 %   MCV 89.8  78.0 - 100.0 fL   MCH 29.4  26.0 - 34.0 pg   MCHC 32.7  30.0 - 36.0 g/dL   RDW 14.7  11.5 - 15.5 %   Platelets 96 (*) 150 - 400 K/uL   Comment: SPECIMEN CHECKED FOR CLOTS     PLATELET COUNT CONFIRMED BY SMEAR  BASIC METABOLIC PANEL     Status: Abnormal   Collection Time    04/12/13  5:34 AM      Result Value Ref Range   Sodium 143  137 - 147 mEq/L   Potassium 4.0  3.7 - 5.3 mEq/L   Chloride 105  96 - 112 mEq/L   CO2 25  19 - 32 mEq/L    Glucose, Bld 103 (*) 70 - 99 mg/dL   BUN 17  6 - 23 mg/dL   Creatinine, Ser 1.06  0.50 - 1.35 mg/dL   Calcium 9.2  8.4 - 10.5 mg/dL   GFR calc non Af Amer 77 (*) >90 mL/min   GFR calc Af Amer 89 (*) >90 mL/min   Comment: (NOTE)     The eGFR has been calculated using the CKD EPI equation.     This calculation has not been validated in all clinical situations.     eGFR's persistently <90 mL/min signify possible Chronic Kidney     Disease.  HEMOGLOBIN A1C     Status: None   Collection Time    04/12/13  5:34 AM      Result Value Ref Range   Hemoglobin A1C 5.6  <5.7 %   Comment: (NOTE)                                                                               According to the ADA Clinical Practice Recommendations for 2011, when     HbA1c is used as a screening test:      >=6.5%   Diagnostic of Diabetes Mellitus               (if  abnormal result is confirmed)     5.7-6.4%   Increased risk of developing Diabetes Mellitus     References:Diagnosis and Classification of Diabetes Mellitus,Diabetes     TZGY,1749,44(HQPRF 1):S62-S69 and Standards of Medical Care in             Diabetes - 2011,Diabetes FMBW,4665,99 (Suppl 1):S11-S61.   Mean Plasma Glucose 114  <117 mg/dL   Comment: Performed at [facility]  TSH     Status: Abnormal   Collection Time    04/12/13  5:34 AM      Result Value Ref Range   TSH 7.310 (*) 0.350 - 4.500 uIU/mL   Comment: Please note change in reference range.     Performed at [facility]  LIPID PANEL     Status: Abnormal   Collection Time    04/12/13  5:35 AM      Result Value Ref Range   Cholesterol 95  0 - 200 mg/dL   Triglycerides 52  <150 mg/dL   HDL 37 (*) >39 mg/dL   Total CHOL/HDL Ratio 2.6     VLDL 10  0 - 40 mg/dL   LDL Cholesterol 48  0 - 99 mg/dL   Comment:            Total Cholesterol/HDL:CHD Risk     Coronary Heart Disease Risk Table                         Men   Women      1/2 Average Risk   3.4   3.3      Average Risk        5.0   4.4      2 X Average Risk   9.6   7.1      3 X Average Risk  23.4   11.0                Use the calculated Patient Ratio     above and the CHD Risk Table     to determine the patient's CHD Risk.                ATP III CLASSIFICATION (LDL):      <100     mg/dL   Optimal      100-129  mg/dL   Near or Above                        Optimal      130-159  mg/dL   Borderline      160-189  mg/dL   High      >190     mg/dL   Very High    Mr Brain Wo Contrast  04/11/2013   CLINICAL DATA:  Syncopal event.  History of strokes.  EXAM: MRI HEAD WITHOUT CONTRAST  TECHNIQUE: Multiplanar, multiecho pulse sequences of the brain and surrounding structures were obtained without intravenous contrast.  COMPARISON:  CT HEAD W/O CM dated 04/11/2013  FINDINGS: The patient was unable to remain motionless for the exam. Small or subtle lesions could be overlooked.  No acute stroke or hemorrhage. No mass lesion or hydrocephalus. No extra-axial fluid.  Premature for age cerebral atrophy. Mild to moderate subcortical and periventricular white matter signal abnormality, likely chronic microvascular ischemic change. Remote bilateral cerebellar infarcts. Large remote left caudate infarct.  Pituitary, pineal, and cerebellar tonsils unremarkable. No upper cervical lesions. Flow voids are maintained  in the carotid, basilar, and vertebral arteries. Mild pannus. A small area of remote hemorrhage is seen a chronic right medial cerebellar infarct. Dysconjugate gaze. No sinus disease. Bilateral dependent mastoid fluid, and nonaggressive. Falx cerebral I ossification.  IMPRESSION: Premature atrophy, small vessel disease, and evidence for remote cerebral infarction. No acute intracranial findings.   Electronically Signed   By: Rolla Flatten M.D.   On: 04/11/2013 19:07    ROS: See chart Blood pressure 120/55, pulse 62, temperature 98.2 F (36.8 C), temperature source Oral, resp. rate 17, height 5' 9"  (1.753 m), weight 51.1 kg (112 lb  10.5 oz), SpO2 96.00%. Physical Exam: Pleasant white male no acute distress. Abdominal examination reveals a large reducible left inguinal hernia, which is so large that the intestinal contents do fall back into the scrotum. He may also have a smaller right inguinal hernia. Right testicle is within normal limits. I believe the left testicle is somewhat atretic.  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 Plan: No need for acute surgical intervention. This can be done as an elective procedure should he become symptomatic. Any surgical intervention would be delayed given his recent sepsis.  Jerek Meulemans A 04/13/2013, 3:55 PM

## 2013-04-13 NOTE — Progress Notes (Signed)
Subjective: He seems better. He is more alert. I think he can start eating now.  Objective: Vital signs in last 24 hours: Temp:  [97.8 F (36.6 C)-99.7 F (37.6 C)] 98.3 F (36.8 C) (04/05 0800) Pulse Rate:  [47-87] 67 (04/05 0700) Resp:  [14-24] 19 (04/05 0700) BP: (82-109)/(39-61) 102/49 mmHg (04/05 0700) SpO2:  [92 %-100 %] 98 % (04/05 0700) Weight:  [51.1 kg (112 lb 10.5 oz)] 51.1 kg (112 lb 10.5 oz) (04/05 0500) Weight change: 0.7 kg (1 lb 8.7 oz)    Intake/Output from previous day: 04/04 0701 - 04/05 0700 In: 2478.3 [I.V.:2078.3; IV Piggyback:400] Out: 1075 [Urine:1075]  PHYSICAL EXAM General appearance: He is more alert but he is still very thin. He is coughing sometimes during exam. Resp: rhonchi bilaterally Cardio: regular rate and rhythm, S1, S2 normal, no murmur, click, rub or gallop GI: soft, non-tender; bowel sounds normal; no masses,  no organomegaly Extremities: extremities normal, atraumatic, no cyanosis or edema  Lab Results:    Basic Metabolic Panel:  Recent Labs  04/11/13 1355 04/12/13 0534  NA 139 143  K 4.1 4.0  CL 101 105  CO2 26 25  GLUCOSE 192* 103*  BUN 20 17  CREATININE 1.23 1.06  CALCIUM 9.7 9.2   Liver Function Tests:  Recent Labs  04/11/13 1355  AST 13  ALT 7  ALKPHOS 55  BILITOT 0.4  PROT 7.4  ALBUMIN 3.5    Recent Labs  04/11/13 1355  LIPASE 12   No results found for this basename: AMMONIA,  in the last 72 hours CBC:  Recent Labs  04/11/13 1355 04/12/13 0534  WBC 19.1* 19.7*  NEUTROABS 17.7*  --   HGB 11.4* 10.4*  HCT 34.4* 31.8*  MCV 89.6 89.8  PLT 110* 96*   Cardiac Enzymes:  Recent Labs  04/11/13 1355  TROPONINI <0.30   BNP: No results found for this basename: PROBNP,  in the last 72 hours D-Dimer: No results found for this basename: DDIMER,  in the last 72 hours CBG: No results found for this basename: GLUCAP,  in the last 72 hours Hemoglobin A1C:  Recent Labs  04/12/13 0534  HGBA1C  5.6   Fasting Lipid Panel:  Recent Labs  04/12/13 0535  CHOL 95  HDL 37*  LDLCALC 48  TRIG 52  CHOLHDL 2.6   Thyroid Function Tests:  Recent Labs  04/12/13 0534  TSH 7.310*   Anemia Panel: No results found for this basename: VITAMINB12, FOLATE, FERRITIN, TIBC, IRON, RETICCTPCT,  in the last 72 hours Coagulation: No results found for this basename: LABPROT, INR,  in the last 72 hours Urine Drug Screen: Drugs of Abuse     Component Value Date/Time   LABOPIA NONE DETECTED 04/11/2013 1502   COCAINSCRNUR NONE DETECTED 04/11/2013 1502   LABBENZ NONE DETECTED 04/11/2013 1502   AMPHETMU NONE DETECTED 04/11/2013 1502   THCU NONE DETECTED 04/11/2013 1502   LABBARB NONE DETECTED 04/11/2013 1502    Alcohol Level:  Recent Labs  04/11/13 1355  ETH <11   Urinalysis:  Recent Labs  04/11/13 1502  COLORURINE YELLOW  LABSPEC 1.010  PHURINE 6.5  GLUCOSEU NEGATIVE  HGBUR NEGATIVE  BILIRUBINUR NEGATIVE  KETONESUR NEGATIVE  PROTEINUR NEGATIVE  UROBILINOGEN 0.2  NITRITE NEGATIVE  LEUKOCYTESUR NEGATIVE   Misc. Labs:  ABGS No results found for this basename: PHART, PCO2, PO2ART, TCO2, HCO3,  in the last 72 hours CULTURES Recent Results (from the past 240 hour(s))  CULTURE, BLOOD (ROUTINE X 2)  Status: None   Collection Time    04/11/13  4:25 PM      Result Value Ref Range Status   Specimen Description LEFT ANTECUBITAL   Final   Special Requests  BAA 6 CC EACH   Final   Culture NO GROWTH 2 DAYS   Final   Report Status PENDING   Incomplete  CULTURE, BLOOD (ROUTINE X 2)     Status: None   Collection Time    04/11/13  4:25 PM      Result Value Ref Range Status   Specimen Description BLOOD RIGHT ARM   Final   Special Requests AEB 4 CC, ANA 2 CC   Final   Culture NO GROWTH 2 DAYS   Final   Report Status PENDING   Incomplete  MRSA PCR SCREENING     Status: None   Collection Time    04/12/13 12:30 AM      Result Value Ref Range Status   MRSA by PCR NEGATIVE  NEGATIVE Final    Comment:            The GeneXpert MRSA Assay (FDA     approved for NASAL specimens     only), is one component of a     comprehensive MRSA colonization     surveillance program. It is not     intended to diagnose MRSA     infection nor to guide or     monitor treatment for     MRSA infections.   Studies/Results: Dg Chest 1 View  04/11/2013   CLINICAL DATA:  Patient fell at [employer]. [place]ent.  EXAM: CHEST - 1 VIEW  COMPARISON:  None.  FINDINGS: The heart is mildly enlarged. There is patchy density at the left lung base, a consistent with infectious infiltrate and possibly representing aspiration pneumonia in light of the history. No pulmonary edema identified.  IMPRESSION: Left lower lobe infiltrate, possibly related to aspiration.   Electronically Signed   By: Shon Hale M.D.   On: 04/11/2013 14:17   Ct Head Wo Contrast  04/11/2013   CLINICAL DATA:  [place], altered level of consciousness  EXAM: CT HEAD WITHOUT CONTRAST  TECHNIQUE: Contiguous axial images were obtained from the base of the skull through the vertex without intravenous contrast.  COMPARISON:  01/31/2004  FINDINGS: Atherosclerotic and physiologic intracranial calcifications. 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  There is no evidence of acute intracranial hemorrhage, brain edema, mass lesion, acute infarction, mass effect, or midline shift. Acute infarct may be inapparent on noncontrast CT. No other intra-axial abnormalities are seen, and the ventricles and sulci are within normal limits in size and symmetry. No abnormal extra-axial fluid collections or masses are identified. No significant calvarial abnormality.  IMPRESSION: 1. Negative for bleed or other acute intracranial process. 2. 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   Electronically Signed   By: Arne Cleveland M.D.   On: 04/11/2013 14:30   Mr Brain Wo Contrast  04/11/2013   CLINICAL DATA:  Syncopal event.  History of  strokes.  EXAM: MRI HEAD WITHOUT CONTRAST  TECHNIQUE: Multiplanar, multiecho pulse sequences of the brain and surrounding structures were obtained without intravenous contrast.  COMPARISON:  CT HEAD W/O CM dated 04/11/2013  FINDINGS: The patient was unable to remain motionless for the exam. Small or subtle lesions could be overlooked.  No acute stroke or hemorrhage. No mass lesion or hydrocephalus. No extra-axial fluid.  Premature for age  cerebral atrophy. Mild to moderate subcortical and periventricular white matter signal abnormality, likely chronic microvascular ischemic change. Remote bilateral cerebellar infarcts. Large remote left caudate infarct.  Pituitary, pineal, and cerebellar tonsils unremarkable. No upper cervical lesions. Flow voids are maintained in the carotid, basilar, and vertebral arteries. Mild pannus. A small area of remote hemorrhage is seen a chronic right medial cerebellar infarct. Dysconjugate gaze. No sinus disease. Bilateral dependent mastoid fluid, and nonaggressive. Falx cerebral I ossification.  IMPRESSION: Premature atrophy, small vessel disease, and evidence for remote cerebral infarction. No acute intracranial findings.   Electronically Signed   By: Rolla Flatten M.D.   On: 04/11/2013 19:07    Medications:  Prior to Admission:  Prescriptions prior to admission  Medication Sig Dispense Refill  . docusate sodium (COLACE) 100 MG capsule Take 100 mg by mouth at bedtime as needed for mild constipation.      . fish oil-omega-3 fatty acids 1000 MG capsule Take 1 g by mouth 3 (three) times daily.      Marland Kitchen guaifenesin (ROBITUSSIN) 100 MG/5ML syrup Take 200 mg by mouth every 4 (four) hours as needed for cough.      . haloperidol (HALDOL) 10 MG tablet Take 10 mg by mouth every evening.      Marland Kitchen levothyroxine (SYNTHROID, LEVOTHROID) 100 MCG tablet Take 100 mcg by mouth daily before breakfast.      . lithium carbonate 300 MG capsule Take 300 mg by mouth 2 (two) times daily.      Marland Kitchen  loratadine (CLARITIN) 10 MG tablet Take 10 mg by mouth daily.      Marland Kitchen lovastatin (MEVACOR) 20 MG tablet Take 20 mg by mouth daily.      . nicotine (NICODERM CQ - DOSED IN MG/24 HOURS) 21 mg/24hr patch Place 1 patch onto the skin daily.      Marland Kitchen oxybutynin (DITROPAN-XL) 10 MG 24 hr tablet Take 10 mg by mouth daily.      . polyethylene glycol powder (GLYCOLAX/MIRALAX) powder Take 17 g by mouth daily. Dissolved in 8 ounces of water/juice daily      . diphenoxylate-atropine (LOMOTIL) 2.5-0.025 MG per tablet Take 1 tablet by mouth 4 (four) times daily as needed for diarrhea or loose stools.  10 tablet  0  . haloperidol decanoate (HALDOL DECANOATE) 100 MG/ML injection Inject 100 mg into the muscle every 28 (twenty-eight) days. Administered by [facility]       Scheduled: . antiseptic oral rinse  15 mL Mouth Rinse BID  . aspirin EC  325 mg Oral Daily  . enoxaparin (LOVENOX) injection  40 mg Subcutaneous Q24H  . haloperidol  10 mg Oral QPM  . levothyroxine  100 mcg Oral QAC breakfast  . lithium carbonate  300 mg Oral BID  . loratadine  10 mg Oral Daily  . nicotine  21 mg Transdermal Daily  . omega-3 acid ethyl esters  1 g Oral TID  . oxybutynin  10 mg Oral Daily  . piperacillin-tazobactam (ZOSYN)  IV  3.375 g Intravenous 3 times per day  . polyethylene glycol  17 g Oral Daily  . simvastatin  10 mg Oral q1800  . sodium chloride  3 mL Intravenous Q12H  . vancomycin  750 mg Intravenous Q12H   Continuous: . sodium chloride 100 mL/hr at 04/13/13 0600   IDP:OEUMPNTIRWERX, acetaminophen, diphenoxylate-atropine, docusate sodium, guaiFENesin, ipratropium-albuterol, ondansetron (ZOFRAN) IV, ondansetron  Assesment: He was admitted with pneumonia and sepsis. His blood pressure is still varying somewhat. I think he is  improving but slowly.  He has history of a stroke which is unchanged  He has schizophrenia which is unchanged  He has severe protein calorie malnutrition and is going to start back on a  diet.  Hypothyroidism is being treated.  Thrombocytopenia probably related to his acute illness Principal Problem:   Sepsis Active Problems:   H/O stroke within last year   Hypothyroidism   Pneumonia   Thrombocytopenia   Schizophrenia   Malnutrition   SIRS (systemic inflammatory response syndrome)   Protein-calorie malnutrition, severe    Plan: Continue current treatments place him on a dysphagia 3 diet    LOS: 2 days   Avien Taha L 04/13/2013, 9:12 AM

## 2013-04-14 ENCOUNTER — Inpatient Hospital Stay (HOSPITAL_COMMUNITY)
Admit: 2013-04-14 | Discharge: 2013-04-14 | Disposition: A | Discharge status: Home / Self Care | Payer: Medicare Other | Attending: Internal Medicine | Admitting: Internal Medicine

## 2013-04-14 LAB — BASIC METABOLIC PANEL
BUN: 14 mg/dL (ref 6–23)
CALCIUM: 8.8 mg/dL (ref 8.4–10.5)
CO2: 26 mEq/L (ref 19–32)
CREATININE: 0.86 mg/dL (ref 0.50–1.35)
Chloride: 110 mEq/L (ref 96–112)
Glucose, Bld: 99 mg/dL (ref 70–99)
Potassium: 4.6 mEq/L (ref 3.7–5.3)
SODIUM: 147 meq/L (ref 137–147)

## 2013-04-14 LAB — VANCOMYCIN, TROUGH: VANCOMYCIN TR: 13.4 ug/mL (ref 10.0–20.0)

## 2013-04-14 MED ORDER — VANCOMYCIN HCL IN DEXTROSE 1-5 GM/200ML-% IV SOLN
1000.0000 mg | Freq: Two times a day (BID) | INTRAVENOUS | Status: DC
  Administered 2013-04-14 – 2013-04-15 (×3): 1000 mg via INTRAVENOUS
  Filled 2013-04-14 (×4): qty 200

## 2013-04-14 MED ORDER — RESOURCE THICKENUP CLEAR PO POWD
ORAL | Status: DC | PRN
  Filled 2013-04-14: qty 125

## 2013-04-14 NOTE — Clinical Social Work Psychosocial (Signed)
Clinical Social Work Department BRIEF PSYCHOSOCIAL ASSESSMENT 04/14/2013  Patient:  Steven David, Steven David     Account Number:  [id number]     Admit date:  04/11/2013  Clinical Social Worker:  Edwyna Shell, [place]  Date/Time:  04/14/2013 02:00 PM  Referred by:  Physician  Date Referred:  04/14/2013  Other Referral:   Interview type:  Patient Other interview type:   also spoke w facility administrator, De Blanch [phone] or [phone])    PSYCHOSOCIAL DATA Living Status:  FACILITY Admitted from facility:  OTHER Level of care:  Assisted Living Primary support name:  De Blanch Primary support relationship to patient:  NONE Degree of support available:   From [facility]    CURRENT CONCERNS Current Concerns  Post-Acute Placement   Other Concerns:    SOCIAL WORK ASSESSMENT / PLAN CSW met w patient at bedside, patient alert, oriented to place and person, knew yesterday was [place].  Speech is difficult to understand; however, patient is able to make basic requests and answer limited questions.  Patient is resident of [facility], confirms that "[facility]" takes care of him.  Is very hard of hearing and has speech impediment.  Was most concerned about "getting my teeth back - I lost my false teeth."  Says this makes it difficult for him to speak and eat.  Says "[facility]" is contacting the dentist about this concern, hopes he can get a new set of false teeth.  Related that he had a meal in a restaurant yesterday for Easter.    CSW spoke w Stanton Kidney, Scientist, physiological of [facility].  Says patient has been a resident of the facility for past 4 years, came from another facility in [place] that closed.  Thinks patient has been in some kind of care facility "for most of his life."  Patient receives help w all ADLs, including bathing, dressing, toileting.  Facility supervises eating.  Allows patient to walk w his walker or cane but staff walk  next to him to provide stand by assist if needed.  Says family rarely visits, per both patient and facliity, they live in [place].  Facility staff says "since he has been sick, they have called more often." Family finds patient difficult to understand and to care for.  Facility is willing to take patient back at discharge, he has both Medicaid and Medicare.  Expressed no concerns about his care.  Receives monthly Haldol injection at [facility] and is under their care for psychiatric needs.  Dr Luan Pulling is his PCP, facility prefers [facility] for [facility] needs.  Patient does not have a guardian at this time.    CSW will keep [facility] informed of patient progress and assist w discharge planning.  Discussed need for new set of false teeth w facliity, says they have contacted dentist to see if he is eligible for a new set.  Apparently, he lost his original set of false teeth approx 4 years ago and has been told that he is not eligible for a new set of teeth.   Assessment/plan status:  Psychosocial Support/Ongoing Assessment of Needs Other assessment/ plan:   Information/referral to community resources:   None needed at this time.    PATIENT'S/FAMILY'S RESPONSE TO PLAN OF CARE: Patient wanted CSW to speak w facliity re his need for false teeth replacement, CSW spoke w Stanton Kidney.  Patient says [facility] is "ok" and is willing to return at discharge. Says his  family visits sometimes.        Edwyna Shell, LCSW Clinical Social Worker [phone])

## 2013-04-14 NOTE — Progress Notes (Signed)
PT Cancellation Note  Patient Details Name: Steven David MRN: [id number] DOB: [birth year]   Cancelled Treatment:     Attempted to see pt but pt was having a procedure.  Will see pt on 04/15/2013   RUSSELL,CINDY 04/14/2013, 3:08 PM

## 2013-04-14 NOTE — Progress Notes (Signed)
Subjective: He feels much better. He has no new complaints.  Objective: Vital signs in last 24 hours: Temp:  [98 F (36.7 C)-99.5 F (37.5 C)] 98.3 F (36.8 C) (04/06 0400) Pulse Rate:  [57-76] 72 (04/06 0800) Resp:  [13-22] 19 (04/06 0800) BP: (92-135)/(49-68) 114/63 mmHg (04/06 0800) SpO2:  [96 %-100 %] 96 % (04/06 0800) Weight:  [54.9 kg (121 lb 0.5 oz)] 54.9 kg (121 lb 0.5 oz) (04/06 0500) Weight change: 3.8 kg (8 lb 6 oz)    Intake/Output from previous day: 04/05 0701 - 04/06 0700 In: 3190 [P.O.:240; I.V.:2400; IV Piggyback:550] Out: 2925 [Urine:2925]  PHYSICAL EXAM General appearance: alert, cooperative, mild distress and His speech is difficult to understand Resp: clear to auscultation bilaterally Cardio: regular rate and rhythm, S1, S2 normal, no murmur, click, rub or gallop GI: He has a left inguinal hernia Extremities: extremities normal, atraumatic, no cyanosis or edema  Lab Results:    Basic Metabolic Panel:  Recent Labs  04/12/13 0534 04/14/13 0504  NA 143 147  K 4.0 4.6  CL 105 110  CO2 25 26  GLUCOSE 103* 99  BUN 17 14  CREATININE 1.06 0.86  CALCIUM 9.2 8.8   Liver Function Tests:  Recent Labs  04/11/13 1355  AST 13  ALT 7  ALKPHOS 55  BILITOT 0.4  PROT 7.4  ALBUMIN 3.5    Recent Labs  04/11/13 1355  LIPASE 12   No results found for this basename: AMMONIA,  in the last 72 hours CBC:  Recent Labs  04/11/13 1355 04/12/13 0534  WBC 19.1* 19.7*  NEUTROABS 17.7*  --   HGB 11.4* 10.4*  HCT 34.4* 31.8*  MCV 89.6 89.8  PLT 110* 96*   Cardiac Enzymes:  Recent Labs  04/11/13 1355  TROPONINI <0.30   BNP: No results found for this basename: PROBNP,  in the last 72 hours D-Dimer: No results found for this basename: DDIMER,  in the last 72 hours CBG: No results found for this basename: GLUCAP,  in the last 72 hours Hemoglobin A1C:  Recent Labs  04/12/13 0534  HGBA1C 5.6   Fasting Lipid Panel:  Recent Labs  04/12/13 0535  CHOL 95  HDL 37*  LDLCALC 48  TRIG 52  CHOLHDL 2.6   Thyroid Function Tests:  Recent Labs  04/12/13 0534  TSH 7.310*   Anemia Panel: No results found for this basename: VITAMINB12, FOLATE, FERRITIN, TIBC, IRON, RETICCTPCT,  in the last 72 hours Coagulation: No results found for this basename: LABPROT, INR,  in the last 72 hours Urine Drug Screen: Drugs of Abuse     Component Value Date/Time   LABOPIA NONE DETECTED 04/11/2013 1502   COCAINSCRNUR NONE DETECTED 04/11/2013 1502   LABBENZ NONE DETECTED 04/11/2013 1502   AMPHETMU NONE DETECTED 04/11/2013 1502   THCU NONE DETECTED 04/11/2013 1502   LABBARB NONE DETECTED 04/11/2013 1502    Alcohol Level:  Recent Labs  04/11/13 1355  ETH <11   Urinalysis:  Recent Labs  04/11/13 1502  COLORURINE YELLOW  LABSPEC 1.010  PHURINE 6.5  GLUCOSEU NEGATIVE  HGBUR NEGATIVE  BILIRUBINUR NEGATIVE  KETONESUR NEGATIVE  PROTEINUR NEGATIVE  UROBILINOGEN 0.2  NITRITE NEGATIVE  LEUKOCYTESUR NEGATIVE   Misc. Labs:  ABGS No results found for this basename: PHART, PCO2, PO2ART, TCO2, HCO3,  in the last 72 hours CULTURES Recent Results (from the past 240 hour(s))  CULTURE, BLOOD (ROUTINE X 2)     Status: None   Collection Time  04/11/13  4:25 PM      Result Value Ref Range Status   Specimen Description LEFT ANTECUBITAL   Final   Special Requests  BAA 6 CC EACH   Final   Culture NO GROWTH 2 DAYS   Final   Report Status PENDING   Incomplete  CULTURE, BLOOD (ROUTINE X 2)     Status: None   Collection Time    04/11/13  4:25 PM      Result Value Ref Range Status   Specimen Description BLOOD RIGHT ARM   Final   Special Requests AEB 4 CC, ANA 2 CC   Final   Culture NO GROWTH 2 DAYS   Final   Report Status PENDING   Incomplete  MRSA PCR SCREENING     Status: None   Collection Time    04/12/13 12:30 AM      Result Value Ref Range Status   MRSA by PCR NEGATIVE  NEGATIVE Final   Comment:            The GeneXpert MRSA  Assay (FDA     approved for NASAL specimens     only), is one component of a     comprehensive MRSA colonization     surveillance program. It is not     intended to diagnose MRSA     infection nor to guide or     monitor treatment for     MRSA infections.   Studies/Results: No results found.  Medications:  Prior to Admission:  Prescriptions prior to admission  Medication Sig Dispense Refill  . docusate sodium (COLACE) 100 MG capsule Take 100 mg by mouth at bedtime as needed for mild constipation.      . fish oil-omega-3 fatty acids 1000 MG capsule Take 1 g by mouth 3 (three) times daily.      Marland Kitchen guaifenesin (ROBITUSSIN) 100 MG/5ML syrup Take 200 mg by mouth every 4 (four) hours as needed for cough.      . haloperidol (HALDOL) 10 MG tablet Take 10 mg by mouth every evening.      Marland Kitchen levothyroxine (SYNTHROID, LEVOTHROID) 100 MCG tablet Take 100 mcg by mouth daily before breakfast.      . lithium carbonate 300 MG capsule Take 300 mg by mouth 2 (two) times daily.      Marland Kitchen loratadine (CLARITIN) 10 MG tablet Take 10 mg by mouth daily.      Marland Kitchen lovastatin (MEVACOR) 20 MG tablet Take 20 mg by mouth daily.      . nicotine (NICODERM CQ - DOSED IN MG/24 HOURS) 21 mg/24hr patch Place 1 patch onto the skin daily.      Marland Kitchen oxybutynin (DITROPAN-XL) 10 MG 24 hr tablet Take 10 mg by mouth daily.      . polyethylene glycol powder (GLYCOLAX/MIRALAX) powder Take 17 g by mouth daily. Dissolved in 8 ounces of water/juice daily      . diphenoxylate-atropine (LOMOTIL) 2.5-0.025 MG per tablet Take 1 tablet by mouth 4 (four) times daily as needed for diarrhea or loose stools.  10 tablet  0  . haloperidol decanoate (HALDOL DECANOATE) 100 MG/ML injection Inject 100 mg into the muscle every 28 (twenty-eight) days. Administered by [facility]       Scheduled: . antiseptic oral rinse  15 mL Mouth Rinse BID  . aspirin EC  325 mg Oral Daily  . enoxaparin (LOVENOX) injection  40 mg Subcutaneous Q24H  . haloperidol  10 mg Oral  QPM  . levothyroxine  100 mcg Oral QAC breakfast  . lithium carbonate  300 mg Oral BID  . loratadine  10 mg Oral Daily  . nicotine  21 mg Transdermal Daily  . omega-3 acid ethyl esters  1 g Oral TID  . oxybutynin  10 mg Oral Daily  . piperacillin-tazobactam (ZOSYN)  IV  3.375 g Intravenous 3 times per day  . polyethylene glycol  17 g Oral Daily  . simvastatin  10 mg Oral q1800  . sodium chloride  3 mL Intravenous Q12H  . vancomycin  750 mg Intravenous Q12H   Continuous: . sodium chloride 100 mL/hr at 04/14/13 0800   WYS:[id number], acetaminophen, diphenoxylate-atropine, docusate sodium, guaiFENesin, ipratropium-albuterol, ondansetron (ZOFRAN) IV, ondansetron  Assesment: He was admitted with pneumonia and sepsis and is much improved. He has a chronic left inguinal hernia. He has schizophrenia which is unchanged. He has protein calorie malnutrition. Principal Problem:   Sepsis Active Problems:   H/O stroke within last year   Hypothyroidism   Pneumonia   Thrombocytopenia   Schizophrenia   Malnutrition   SIRS (systemic inflammatory response syndrome)   Protein-calorie malnutrition, severe    Plan: He will move from the ICU. Continue with his other treatments.    LOS: 3 days   Sonya Gunnoe L 04/14/2013, 9:08 AM

## 2013-04-14 NOTE — Care Management Note (Addendum)
    Page 1 of 2   04/16/2013     11:09:39 AM   CARE MANAGEMENT NOTE 04/16/2013  Patient:  Steven David, Steven David   Account Number:  [id number]  Date Initiated:  04/14/2013  Documentation initiated by:  Theophilus Kinds  Subjective/Objective Assessment:   Pt admitted from [facility] home. Pt will return to facility at discharge.     Action/Plan:   CSW to arrange discharge to facility when medically stable.   Anticipated DC Date:  04/18/2013   Anticipated DC Plan:  ASSISTED LIVING / REST HOME  In-house referral  Clinical Social Worker      DC Forensic scientist  CM consult      [facility] Choice  HOME HEALTH   Choice offered to / List presented to:  C-1 Patient        [place] arranged  HH-1 RN      [place].   Status of service:  Completed, signed off Medicare Important Message given?  YES (If response is "NO", the following Medicare IM given date fields will be blank) Date Medicare IM given:  04/16/2013 Date Additional Medicare IM given:    Discharge Disposition:  ASSISTED LIVING  Per UR Regulation:    If discussed at Long Length of Stay Meetings, dates discussed:    Comments:  04/16/13 [place], RN BSN CM Pt discharged to [facility] today with [facility] RN (per pts choice). Romualdo Bolk of [facility] is aware and will collect the pts information from the chart. [place] services to start within 48 hours of discharge. No DME needs noted. Pt and pts nurse aware of discharge arrangements.  04/14/13 [place], RN BSN CM

## 2013-04-14 NOTE — Progress Notes (Signed)
ANTIBIOTIC CONSULT NOTE  Pharmacy Consult for Vancomycin & Zosyn Indication: Sepsis, Pneumonia  No Known Allergies  Patient Measurements: Height: 5\' 9"  (175.3 cm) Weight: 121 lb 0.5 oz (54.9 kg) IBW/kg (Calculated) : 70.7  Vital Signs: Temp: 98.3 F (36.8 C) (04/06 0400) Temp src: Oral (04/06 0400) BP: 114/63 mmHg (04/06 0800) Pulse Rate: 72 (04/06 0800) Intake/Output from previous day: 04/05 0701 - 04/06 0700 In: 3190 [P.O.:240; I.V.:2400; IV Piggyback:550] Out: 2925 [Urine:2925] Intake/Output from this shift: Total I/O In: 493 [P.O.:240; I.V.:103; IV Piggyback:150] Out: 825 [Urine:825]  Labs:  Recent Labs  04/11/13 1355 04/12/13 0534 04/14/13 0504  WBC 19.1* 19.7*  --   HGB 11.4* 10.4*  --   PLT 110* 96*  --   CREATININE 1.23 1.06 0.86   Estimated Creatinine Clearance: 74.5 ml/min (by C-G formula based on Cr of 0.86).  Recent Labs  04/14/13 0756  VANCOTROUGH 13.4     Microbiology: Recent Results (from the past 720 hour(s))  CULTURE, BLOOD (ROUTINE X 2)     Status: None   Collection Time    04/11/13  4:25 PM      Result Value Ref Range Status   Specimen Description LEFT ANTECUBITAL   Final   Special Requests  BAA 6 CC EACH   Final   Culture NO GROWTH 2 DAYS   Final   Report Status PENDING   Incomplete  CULTURE, BLOOD (ROUTINE X 2)     Status: None   Collection Time    04/11/13  4:25 PM      Result Value Ref Range Status   Specimen Description BLOOD RIGHT ARM   Final   Special Requests AEB 4 CC, ANA 2 CC   Final   Culture NO GROWTH 2 DAYS   Final   Report Status PENDING   Incomplete  MRSA PCR SCREENING     Status: None   Collection Time    04/12/13 12:30 AM      Result Value Ref Range Status   MRSA by PCR NEGATIVE  NEGATIVE Final   Comment:            The GeneXpert MRSA Assay (FDA     approved for NASAL specimens     only), is one component of a     comprehensive MRSA colonization     surveillance program. It is not     intended to diagnose  MRSA     infection nor to guide or     monitor treatment for     MRSA infections.    Medical History: Past Medical History  Diagnosis Date  . Schizophrenia   . Hernia   . Thyroid disease   . High cholesterol   . HOH (hard of hearing)     Medications:  Scheduled:  . antiseptic oral rinse  15 mL Mouth Rinse BID  . aspirin EC  325 mg Oral Daily  . enoxaparin (LOVENOX) injection  40 mg Subcutaneous Q24H  . haloperidol  10 mg Oral QPM  . levothyroxine  100 mcg Oral QAC breakfast  . lithium carbonate  300 mg Oral BID  . loratadine  10 mg Oral Daily  . nicotine  21 mg Transdermal Daily  . omega-3 acid ethyl esters  1 g Oral TID  . oxybutynin  10 mg Oral Daily  . piperacillin-tazobactam (ZOSYN)  IV  3.375 g Intravenous 3 times per day  . polyethylene glycol  17 g Oral Daily  . simvastatin  10 mg Oral  [id number]  . sodium chloride  3 mL Intravenous Q12H  . vancomycin  750 mg Intravenous Q12H    Assessment: 58 yo M who lives in a group home presents today after witnessed syncopal episode.  He is currently on day#4 empiric, broad-spectrum antibiotics for PNA, sepsis.   WBC remains elevated but patient is clinically improved.  Renal function has returned to patient's baseline.  All cx data negative to date.  Vancomycin trough slightly below goal range today.   Vancomycin 4/3>> Zosyn 4/3>>  Goal of Therapy:  Vancomycin trough level 15-20 mcg/ml  Plan:  Zosyn 3.375gm IV Q8h to be infused over 4hrs Increase Vancomycin 1gm IV q12h Weekly Vancomycin trough  Monitor renal function and cx data  Duration of therapy per MD- consider de-escalate antibiotics/change to po as appropriate  Steven David, Steven David 04/14/2013,10:24 AM

## 2013-04-14 NOTE — Progress Notes (Signed)
EEG Completed; Results Pending  

## 2013-04-15 MED ORDER — ENSURE PUDDING PO PUDG
1.0000 | Freq: Three times a day (TID) | ORAL | Status: DC
  Administered 2013-04-15 (×2): 1 via ORAL

## 2013-04-15 NOTE — Progress Notes (Signed)
Subjective: He says he feels better. He says he can't walk but the nursing staff says he's been up and walking around. He has no new complaints. His breathing is doing okay.  Objective: Vital signs in last 24 hours: Temp:  [98 F (36.7 C)-98.7 F (37.1 C)] 98.7 F (37.1 C) (04/07 0757) Pulse Rate:  [59-110] 59 (04/07 0500) Resp:  [15-25] 15 (04/07 0500) BP: (91-136)/(49-73) 91/56 mmHg (04/07 0500) SpO2:  [92 %-95 %] 92 % (04/07 0500) Weight:  [58.6 kg (129 lb 3 oz)] 58.6 kg (129 lb 3 oz) (04/07 0500) Weight change: 3.7 kg (8 lb 2.5 oz)    Intake/Output from previous day: 04/06 0701 - 04/07 0700 In: 3663 [P.O.:960; I.V.:2203; IV Piggyback:500] Out: 2525 [Urine:2525]  PHYSICAL EXAM General appearance: alert, cooperative and His speech is difficult to understand Resp: rhonchi bilaterally Cardio: regular rate and rhythm, S1, S2 normal, no murmur, click, rub or gallop GI: soft, non-tender; bowel sounds normal; no masses,  no organomegaly Extremities: extremities normal, atraumatic, no cyanosis or edema  Lab Results:    Basic Metabolic Panel:  Recent Labs  04/14/13 0504  NA 147  K 4.6  CL 110  CO2 26  GLUCOSE 99  BUN 14  CREATININE 0.86  CALCIUM 8.8   Liver Function Tests: No results found for this basename: AST, ALT, ALKPHOS, BILITOT, PROT, ALBUMIN,  in the last 72 hours No results found for this basename: LIPASE, AMYLASE,  in the last 72 hours No results found for this basename: AMMONIA,  in the last 72 hours CBC: No results found for this basename: WBC, NEUTROABS, HGB, HCT, MCV, PLT,  in the last 72 hours Cardiac Enzymes: No results found for this basename: CKTOTAL, CKMB, CKMBINDEX, TROPONINI,  in the last 72 hours BNP: No results found for this basename: PROBNP,  in the last 72 hours D-Dimer: No results found for this basename: DDIMER,  in the last 72 hours CBG: No results found for this basename: GLUCAP,  in the last 72 hours Hemoglobin A1C: No results  found for this basename: HGBA1C,  in the last 72 hours Fasting Lipid Panel: No results found for this basename: CHOL, HDL, LDLCALC, TRIG, CHOLHDL, LDLDIRECT,  in the last 72 hours Thyroid Function Tests: No results found for this basename: TSH, T4TOTAL, FREET4, T3FREE, THYROIDAB,  in the last 72 hours Anemia Panel: No results found for this basename: VITAMINB12, FOLATE, FERRITIN, TIBC, IRON, RETICCTPCT,  in the last 72 hours Coagulation: No results found for this basename: LABPROT, INR,  in the last 72 hours Urine Drug Screen: Drugs of Abuse     Component Value Date/Time   LABOPIA NONE DETECTED 04/11/2013 1502   COCAINSCRNUR NONE DETECTED 04/11/2013 1502   LABBENZ NONE DETECTED 04/11/2013 1502   AMPHETMU NONE DETECTED 04/11/2013 1502   THCU NONE DETECTED 04/11/2013 1502   LABBARB NONE DETECTED 04/11/2013 1502    Alcohol Level: No results found for this basename: ETH,  in the last 72 hours Urinalysis: No results found for this basename: COLORURINE, APPERANCEUR, LABSPEC, PHURINE, GLUCOSEU, HGBUR, BILIRUBINUR, KETONESUR, PROTEINUR, UROBILINOGEN, NITRITE, LEUKOCYTESUR,  in the last 72 hours Misc. Labs:  ABGS No results found for this basename: PHART, PCO2, PO2ART, TCO2, HCO3,  in the last 72 hours CULTURES Recent Results (from the past 240 hour(s))  CULTURE, BLOOD (ROUTINE X 2)     Status: None   Collection Time    04/11/13  4:25 PM      Result Value Ref Range Status  Specimen Description LEFT ANTECUBITAL   Final   Special Requests  BAA 6 CC EACH   Final   Culture NO GROWTH 3 DAYS   Final   Report Status PENDING   Incomplete  CULTURE, BLOOD (ROUTINE X 2)     Status: None   Collection Time    04/11/13  4:25 PM      Result Value Ref Range Status   Specimen Description BLOOD RIGHT ARM   Final   Special Requests AEB 4 CC, ANA 2 CC   Final   Culture NO GROWTH 3 DAYS   Final   Report Status PENDING   Incomplete  MRSA PCR SCREENING     Status: None   Collection Time    04/12/13 12:30 AM       Result Value Ref Range Status   MRSA by PCR NEGATIVE  NEGATIVE Final   Comment:            The GeneXpert MRSA Assay (FDA     approved for NASAL specimens     only), is one component of a     comprehensive MRSA colonization     surveillance program. It is not     intended to diagnose MRSA     infection nor to guide or     monitor treatment for     MRSA infections.   Studies/Results: No results found.  Medications:  Prior to Admission:  Prescriptions prior to admission  Medication Sig Dispense Refill  . docusate sodium (COLACE) 100 MG capsule Take 100 mg by mouth at bedtime as needed for mild constipation.      . fish oil-omega-3 fatty acids 1000 MG capsule Take 1 g by mouth 3 (three) times daily.      Marland Kitchen guaifenesin (ROBITUSSIN) 100 MG/5ML syrup Take 200 mg by mouth every 4 (four) hours as needed for cough.      . haloperidol (HALDOL) 10 MG tablet Take 10 mg by mouth every evening.      Marland Kitchen levothyroxine (SYNTHROID, LEVOTHROID) 100 MCG tablet Take 100 mcg by mouth daily before breakfast.      . lithium carbonate 300 MG capsule Take 300 mg by mouth 2 (two) times daily.      Marland Kitchen loratadine (CLARITIN) 10 MG tablet Take 10 mg by mouth daily.      Marland Kitchen lovastatin (MEVACOR) 20 MG tablet Take 20 mg by mouth daily.      . nicotine (NICODERM CQ - DOSED IN MG/24 HOURS) 21 mg/24hr patch Place 1 patch onto the skin daily.      Marland Kitchen oxybutynin (DITROPAN-XL) 10 MG 24 hr tablet Take 10 mg by mouth daily.      . polyethylene glycol powder (GLYCOLAX/MIRALAX) powder Take 17 g by mouth daily. Dissolved in 8 ounces of water/juice daily      . diphenoxylate-atropine (LOMOTIL) 2.5-0.025 MG per tablet Take 1 tablet by mouth 4 (four) times daily as needed for diarrhea or loose stools.  10 tablet  0  . haloperidol decanoate (HALDOL DECANOATE) 100 MG/ML injection Inject 100 mg into the muscle every 28 (twenty-eight) days. Administered by [facility]       Scheduled: . antiseptic oral rinse  15 mL Mouth Rinse BID  .  aspirin EC  325 mg Oral Daily  . enoxaparin (LOVENOX) injection  40 mg Subcutaneous Q24H  . haloperidol  10 mg Oral QPM  . levothyroxine  100 mcg Oral QAC breakfast  . lithium carbonate  300 mg Oral BID  .  loratadine  10 mg Oral Daily  . nicotine  21 mg Transdermal Daily  . omega-3 acid ethyl esters  1 g Oral TID  . oxybutynin  10 mg Oral Daily  . piperacillin-tazobactam (ZOSYN)  IV  3.375 g Intravenous 3 times per day  . polyethylene glycol  17 g Oral Daily  . simvastatin  10 mg Oral q1800  . sodium chloride  3 mL Intravenous Q12H  . vancomycin  1,000 mg Intravenous Q12H   Continuous: . sodium chloride 100 mL/hr at 04/15/13 0600   HYQ:[id number], acetaminophen, diphenoxylate-atropine, docusate sodium, guaiFENesin, ipratropium-albuterol, ondansetron (ZOFRAN) IV, ondansetron, RESOURCE THICKENUP CLEAR  Assesment: He was admitted with sepsis related to probable aspiration pneumonia. He has severe protein calorie malnutrition. He was septic but that is much improved. He has a large left inguinal hernia which is chronic. He has a history of a stroke and I don't think there's any change from that. He has schizophrenia which is being treated. Principal Problem:   Sepsis Active Problems:   H/O stroke within last year   Hypothyroidism   Pneumonia   Thrombocytopenia   Schizophrenia   Malnutrition   SIRS (systemic inflammatory response syndrome)   Protein-calorie malnutrition, severe    Plan: He is much improved. He will be transferred from the ICU. There is some potential that he may be able to be discharged tomorrow    LOS: 4 days   Jones Viviani L 04/15/2013, 8:34 AM

## 2013-04-15 NOTE — Progress Notes (Signed)
Report called and given to Val D, LPN. Patient being transferred to dept 300, room 307. Patient alert, oriented and in stable condition at the time of transport. Patient transported in wheelchair, off tele, on Room air to room 307 by NT and nursing student.

## 2013-04-15 NOTE — Evaluation (Signed)
Clinical/Bedside Swallow Evaluation Patient Details  Name: Steven David MRN: [id number] Date of Birth: 1955-09-26  Today's Date: 04/15/2013 Time: 1130-1205 SLP Time Calculation (min): 35 min  Past Medical History:  Past Medical History  Diagnosis Date  . Schizophrenia   . Hernia   . Thyroid disease   . High cholesterol   . HOH (hard of hearing)    Past Surgical History:  Past Surgical History  Procedure Laterality Date  . Leg surgery    . Testicle surgery     HPI:  58 y/o schizophrenic male from [facility],  with hx of ?stroke ( never documented),  schizophrenia, hypothyroidism, HL, was at a restaurant when he had a brief syncopal episode. As per ED ,the bystanders witnessed patient to have urinary incontinence and an episode of vomiting. Patient is unable to provide much history. He was brought to the hospital, by EMS. Patient on arrival was found to have BP of 73/52 mmhg , tachycardic upto 120 and febrile to 102.22F. He also had a wbc of 19.1, meeting criteria for sepsis. Hb was 11.4 and platelets of 110.  pt currently on soft diet and NTL  Assessment / Plan / Recommendation Clinical Impression  pt exhibiting s/s pharyngeal dysphagia,with delayed cough with all thin liquid trials, no cough with NTL, is impulsive with bite and drink size     Aspiration Risk  Mild    Diet Recommendation Dysphagia 3 (Mechanical Soft);Nectar-thick liquid   Liquid Administration via: Cup Medication Administration: Crushed with puree Supervision: Staff to assist with self feeding Compensations: Slow rate;Small sips/bites Postural Changes and/or Swallow Maneuvers: Seated upright 90 degrees;Upright 30-60 min after meal    Other  Recommendations Oral Care Recommendations: Oral care BID Other Recommendations: Order thickener from pharmacy;Prohibited food (jello, ice cream, thin soups);Remove water pitcher   Follow Up Recommendations  ST to follow up for reassess of safe tolerance  to thin liquids     Question possible need for MBS but unsure if pt would tolerate the procedure   Frequency and Duration min 2x/week  1 week        SLP Swallow Goals Pt will safely tolerate D3 diet and NTL with no overt s/s aspiration and good intake Pt will follow compensatory techniques with max cues to decrease risk of aspiration Pt will tolerate trials of thin liquids with SLP to assess for possible diet upgrade    Swallow Study Prior Functional Status  Prior diet not found in history but pt living at [facility] family home and went out in community to eat so assuming reg diet and thin liquids with no prior reports of dysphagia    General Date of Onset: 04/11/13 HPI: 58 y/o schizophrenic male from [facility],  with hx of ?stroke ( never documented),  schizophrenia, hypothyroidism, HL, was at a restaurant when he had a brief syncopal episode. As per ED ,the bystanders witnessed patient to have urinary incontinence and an episode of vomiting. Patient is unable to provide much history. He was brought to the hospital, by EMS. Patient on arrival was found to have BP of 73/52 mmhg , tachycardic upto 120 and febrile to 102.22F. He also had a wbc of 19.1, meeting criteria for sepsis. Hb was 11.4 and platelets of 110.  Type of Study: Bedside swallow evaluation Diet Prior to this Study: Information not available Temperature Spikes Noted: No Respiratory Status: Room air History of Recent Intubation: No Behavior/Cognition: Alert;Cooperative;Hard of hearing Oral Cavity - Dentition: Edentulous  Self-Feeding Abilities: Able to feed self Patient Positioning: Upright in chair Baseline Vocal Quality: Clear Volitional Cough: Strong Volitional Swallow: Able to elicit    Oral/Motor/Sensory Function Overall Oral Motor/Sensory Function: Appears within functional limits for tasks assessed (mild facial tremors) Labial ROM: Within Functional Limits Labial Symmetry: Within Functional  Limits Labial Strength: Within Functional Limits Labial Sensation: Within Functional Limits Lingual ROM: Within Functional Limits Lingual Symmetry: Within Functional Limits Lingual Strength: Within Functional Limits Lingual Sensation: Within Functional Limits Facial ROM: Within Functional Limits Facial Symmetry: Within Functional Limits Facial Strength: Within Functional Limits Facial Sensation: Within Functional Limits Velum: Within Functional Limits Mandible: Within Functional Limits   Ice Chips Ice chips: Within functional limits Presentation: Spoon   Thin Liquid Thin Liquid: Impaired Presentation: Cup Pharyngeal  Phase Impairments: Suspected delayed Swallow;Cough - Delayed Other Comments: cough after swallow of all thin liquids trials, no wetness    Nectar Thick Nectar Thick Liquid: Within functional limits Presentation: Cup Other Comments: no s/s with NTL       Honey Thick Honey Thick Liquid: Not tested   Puree Puree: Within functional limits   Solid   GO  Solid: Within functional limits Presentation: Self Fed Other Comments: impulsive with bite and drink size       Pollyann Glen 04/15/2013,12:36 PM

## 2013-04-15 NOTE — Progress Notes (Signed)
NUTRITION FOLLOW UP  Intervention:   Ensure Pudding po TID, each supplement provides 170 kcal and 4 grams of protein  Nutrition Dx:   Malnutrition related to inadequate oral intake as evidenced by unexplained severe weight loss 19#, 14.6% <7 months; ongoing  Goal:   Pt will meet >90% of estimated energy, protein and fluid daily to meet estimated needs; goal met  Monitor:   PO intake, skin assessments, I/O's, labs and weight changes.  Assessment:   Pt diagnosed with malnutrition at last RD visit on 04/12/13. Since last visit, pt has been advanced to a dysphagia 3 diet and is tolerating well. Noted 75-100% intake with meals. Also noted 17# (15%) wt gain x 4 days, likely due to improved PO intake, but increased fluid could also be a contributing factor. Calorie needs adjusted.  Speech therapist evaluating pt at time of visit; recommendations are for a dysphagia 3 diet with nectar thick liquids.   Height: Ht Readings from Last 1 Encounters:  04/11/13 5' 9"  (1.753 m)    Weight Status:   Wt Readings from Last 1 Encounters:  04/15/13 129 lb 3 oz (58.6 kg)    Re-estimated needs:  Kcal: 2200-2400 (to promote weight gain) Protein: 58-73 grams daily Fluid: 2.2-2.4 L daily  Skin: Intact  Diet Order: Dysphagia   Intake/Output Summary (Last 24 hours) at 04/15/13 1318 Last data filed at 04/15/13 1127  Gross per 24 hour  Intake   2670 ml  Output   2600 ml  Net     70 ml    Last BM: PTA   Labs:   Recent Labs Lab 04/11/13 1355 04/12/13 0534 04/14/13 0504  NA 139 143 147  K 4.1 4.0 4.6  CL 101 105 110  CO2 26 25 26   BUN 20 17 14   CREATININE 1.23 1.06 0.86  CALCIUM 9.7 9.2 8.8  GLUCOSE 192* 103* 99    CBG (last 3)  No results found for this basename: GLUCAP,  in the last 72 hours  Scheduled Meds: . antiseptic oral rinse  15 mL Mouth Rinse BID  . aspirin EC  325 mg Oral Daily  . enoxaparin (LOVENOX) injection  40 mg Subcutaneous Q24H  . haloperidol  10 mg Oral QPM   . levothyroxine  100 mcg Oral QAC breakfast  . lithium carbonate  300 mg Oral BID  . loratadine  10 mg Oral Daily  . nicotine  21 mg Transdermal Daily  . omega-3 acid ethyl esters  1 g Oral TID  . oxybutynin  10 mg Oral Daily  . piperacillin-tazobactam (ZOSYN)  IV  3.375 g Intravenous 3 times per day  . polyethylene glycol  17 g Oral Daily  . simvastatin  10 mg Oral q1800  . sodium chloride  3 mL Intravenous Q12H  . vancomycin  1,000 mg Intravenous Q12H    Continuous Infusions: . sodium chloride 100 mL/hr at 04/15/13 0950    Suttyn Cryder A. Jimmye Norman, RD, LDN Pager: [phone]

## 2013-04-15 NOTE — Evaluation (Signed)
Physical Therapy Evaluation Patient Details Name: Steven David MRN: [id number] DOB: Oct 20, 1955 Today's Date: 04/15/2013   History of Present Illness  58 y/o schizophrenic male from [facility],  with hx of ?stroke ( never documented),  schizophrenia, hypothyroidism, HL, was at a restaurant when he had a brief syncopal episode. As per ED ,the bystanders witnessed patient to have urinary incontinence and an episode of vomiting. Patient is unable to provide much history. He was brought to the hospital, by EMS. Patient on arrival was found to have BP of 73/52 mmhg , tachycardic upto 120 and febrile to 102.41F. He also had a wbc of 19.1, meeting criteria for sepsis. Hb was 11.4 and platelets of 110.  Clinical Impression  Pt appears to be at prior level.  Pt was able to put on shoes I, bed mobility with min assist and gait with SBA.  He will be returning to the group home and will have 24/7 supervision I do not believe skilled PT is needed at this time although he will need to be ambulated with the nursing staff.    Follow Up Recommendations No PT follow up    Equipment Recommendations  None recommended by PT    Recommendations for Other Services       Precautions / Restrictions Precautions Precautions: None Restrictions Weight Bearing Restrictions: No      Mobility  Bed Mobility Overal bed mobility: Needs Assistance Bed Mobility: Supine to Sit     Supine to sit: Min assist        Transfers Overall transfer level: Needs assistance Equipment used: Straight cane Transfers: Stand Pivot Transfers   Stand pivot transfers: Supervision          Ambulation/Gait Ambulation/Gait assistance: Supervision Ambulation Distance (Feet): 30 Feet Assistive device: Rolling walker (2 wheeled);Straight cane Gait Pattern/deviations: Decreased step length - left;Decreased step length - right;Shuffle   Gait velocity interpretation: Below normal speed for age/gender General  Gait Details: PT appeared somewhat unsteady with cane so therapist attempted gt training with walker.  It appears that pt is more comfortable with the cane he was very hesitant with the walker.                                Home Living Family/patient expects to be discharged to:: Group home Living Arrangements: Non-relatives/Friends           Prior Function Level of Independence: Needs assistance   Gait / Transfers Assistance Needed: uses cane needs SBA  ADL's / Homemaking Assistance Needed: able to do some ADL's ;  total assist for homemaking                   Lower Extremity Assessment: Overall WFL for tasks assessed         Communication   Communication:  (somewhat difficult to understand but speech appears to be improved since yesterday)  Cognition Arousal/Alertness: Awake/alert Behavior During Therapy: WFL for tasks assessed/performed Overall Cognitive Status: Within Functional Limits for tasks assessed                 Assessment/Plan    PT Assessment Patent does not need any further PT services  PT Diagnosis     PT Problem List    PT Treatment Interventions     PT Goals (Current goals can be found in the Care Plan section) Acute Rehab PT Goals PT Goal Formulation: No goals set,  d/c therapy               End of Session Equipment Utilized During Treatment: Gait belt Activity Tolerance: Patient tolerated treatment well Patient left: in chair;with call bell/phone within reach Nurse Communication: Mobility status         Time: 2376-2831 PT Time Calculation (min): 28 min   Charges:   PT Evaluation $Initial PT Evaluation Tier I: 1 Procedure     PT G Codes:          Andrews Tener,CINDY 05-11-13, 11:48 AM

## 2013-04-16 LAB — CULTURE, BLOOD (ROUTINE X 2)
CULTURE: NO GROWTH
Culture: NO GROWTH

## 2013-04-16 LAB — BASIC METABOLIC PANEL
BUN: 12 mg/dL (ref 6–23)
CHLORIDE: 114 meq/L — AB (ref 96–112)
CO2: 27 mEq/L (ref 19–32)
Calcium: 8.2 mg/dL — ABNORMAL LOW (ref 8.4–10.5)
Creatinine, Ser: 0.84 mg/dL (ref 0.50–1.35)
GFR calc non Af Amer: >90 mL/min (ref >90)
Glucose, Bld: 89 mg/dL (ref 70–99)
POTASSIUM: 4.5 meq/L (ref 3.7–5.3)
Sodium: 148 mEq/L — ABNORMAL HIGH (ref 137–147)

## 2013-04-16 MED ORDER — RESOURCE THICKENUP CLEAR PO POWD: 1.0000 | ORAL | Status: DC | PRN

## 2013-04-16 MED ORDER — AMOXICILLIN-POT CLAVULANATE 875-125 MG PO TABS: 1.0000 | ORAL_TABLET | Freq: Two times a day (BID) | ORAL | Status: DC

## 2013-04-16 MED ORDER — AMOXICILLIN-POT CLAVULANATE 875-125 MG PO TABS
1.0000 | ORAL_TABLET | Freq: Two times a day (BID) | ORAL | Status: DC
  Administered 2013-04-16: 1 via ORAL
  Filled 2013-04-16: qty 1

## 2013-04-16 MED ORDER — ENSURE PUDDING PO PUDG: 1.0000 | Freq: Three times a day (TID) | ORAL | Status: DC

## 2013-04-16 NOTE — Clinical Social Work Note (Signed)
Patient ready for discharge today, will return to [facility] w facility transport.  [place], administrator, informed and agreeable.  Discharge summary provided in packet, facility not on TLC.  FL2 reviewed w RN and updated as needed.  Discharge packet prepared and placed w shadow chart for transport.  Patient informed and agreeable.  CSW signing off as no further SW needs identified.  Edwyna Shell, LCSW Clinical Social Worker [phone])

## 2013-04-16 NOTE — Discharge Summary (Signed)
Physician Discharge Summary  Patient ID: Steven David MRN: [id number] DOB/AGE: 1955/03/22 58 y.o. Primary Care Physician:Lariya Kinzie L, MD Admit date: 04/11/2013 Discharge date: 04/16/2013    Discharge Diagnoses:   Principal Problem:   Sepsis Active Problems:   H/O stroke within last year   Hypothyroidism   Pneumonia   Thrombocytopenia   Schizophrenia   Malnutrition   SIRS (systemic inflammatory response syndrome)   Protein-calorie malnutrition, severe     Medication List         amoxicillin-clavulanate 875-125 MG per tablet  Commonly known as:  AUGMENTIN  Take 1 tablet by mouth every 12 (twelve) hours.     diphenoxylate-atropine 2.5-0.025 MG per tablet  Commonly known as:  LOMOTIL  Take 1 tablet by mouth 4 (four) times daily as needed for diarrhea or loose stools.     docusate sodium 100 MG capsule  Commonly known as:  COLACE  Take 100 mg by mouth at bedtime as needed for mild constipation.     feeding supplement (ENSURE) Pudg  Take 1 Container by mouth 3 (three) times daily between meals.     fish oil-omega-3 fatty acids 1000 MG capsule  Take 1 g by mouth 3 (three) times daily.     guaifenesin 100 MG/5ML syrup  Commonly known as:  ROBITUSSIN  Take 200 mg by mouth every 4 (four) hours as needed for cough.     haloperidol 10 MG tablet  Commonly known as:  HALDOL  Take 10 mg by mouth every evening.     haloperidol decanoate 100 MG/ML injection  Commonly known as:  HALDOL DECANOATE  Inject 100 mg into the muscle every 28 (twenty-eight) days. Administered by [facility]     levothyroxine 100 MCG tablet  Commonly known as:  SYNTHROID, LEVOTHROID  Take 100 mcg by mouth daily before breakfast.     lithium carbonate 300 MG capsule  Take 300 mg by mouth 2 (two) times daily.     loratadine 10 MG tablet  Commonly known as:  CLARITIN  Take 10 mg by mouth daily.     lovastatin 20 MG tablet  Commonly known as:  MEVACOR  Take 20 mg by mouth daily.      nicotine 21 mg/24hr patch  Commonly known as:  NICODERM CQ - dosed in mg/24 hours  Place 1 patch onto the skin daily.     oxybutynin 10 MG 24 hr tablet  Commonly known as:  DITROPAN-XL  Take 10 mg by mouth daily.     polyethylene glycol powder powder  Commonly known as:  GLYCOLAX/MIRALAX  Take 17 g by mouth daily. Dissolved in 8 ounces of water/juice daily     RESOURCE THICKENUP CLEAR Powd  Take 1 Container by mouth as needed (Used to make liquids nectar thick).        Discharged Condition: Improved    Consults: None  Significant Diagnostic Studies: Dg Chest 1 View  04/11/2013   CLINICAL DATA:  Patient fell at [employer]. Found at the [place].  EXAM: CHEST - 1 VIEW  COMPARISON:  None.  FINDINGS: The heart is mildly enlarged. There is patchy density at the left lung base, a consistent with infectious infiltrate and possibly representing aspiration pneumonia in light of the history. No pulmonary edema identified.  IMPRESSION: Left lower lobe infiltrate, possibly related to aspiration.   Electronically Signed   By: Shon Hale M.D.   On: 04/11/2013 14:17   Ct Head Wo Contrast  04/11/2013   CLINICAL DATA:  Fell, altered level of consciousness  EXAM: CT HEAD WITHOUT CONTRAST  TECHNIQUE: Contiguous axial images were obtained from the base of the skull through the vertex without intravenous contrast.  COMPARISON:  01/31/2004  FINDINGS: Atherosclerotic and physiologic intracranial calcifications. 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  There is no evidence of acute intracranial hemorrhage, brain edema, mass lesion, acute infarction, mass effect, or midline shift. Acute infarct may be inapparent on noncontrast CT. No other intra-axial abnormalities are seen, and the ventricles and sulci are within normal limits in size and symmetry. No abnormal extra-axial fluid collections or masses are identified. No significant calvarial abnormality.  IMPRESSION: 1. Negative for  bleed or other acute intracranial process. 2. 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   Electronically Signed   By: Arne Cleveland M.D.   On: 04/11/2013 14:30   Mr Brain Wo Contrast  04/11/2013   CLINICAL DATA:  Syncopal event.  History of strokes.  EXAM: MRI HEAD WITHOUT CONTRAST  TECHNIQUE: Multiplanar, multiecho pulse sequences of the brain and surrounding structures were obtained without intravenous contrast.  COMPARISON:  CT HEAD W/O CM dated 04/11/2013  FINDINGS: The patient was unable to remain motionless for the exam. Small or subtle lesions could be overlooked.  No acute stroke or hemorrhage. No mass lesion or hydrocephalus. No extra-axial fluid.  Premature for age cerebral atrophy. Mild to moderate subcortical and periventricular white matter signal abnormality, likely chronic microvascular ischemic change. Remote bilateral cerebellar infarcts. Large remote left caudate infarct.  Pituitary, pineal, and cerebellar tonsils unremarkable. No upper cervical lesions. Flow voids are maintained in the carotid, basilar, and vertebral arteries. Mild pannus. A small area of remote hemorrhage is seen a chronic right medial cerebellar infarct. Dysconjugate gaze. No sinus disease. Bilateral dependent mastoid fluid, and nonaggressive. Falx cerebral I ossification.  IMPRESSION: Premature atrophy, small vessel disease, and evidence for remote cerebral infarction. No acute intracranial findings.   Electronically Signed   By: Rolla Flatten M.D.   On: 04/11/2013 19:07    Lab Results: Basic Metabolic Panel:  Recent Labs  04/14/13 0504 04/16/13 0459  NA 147 148*  K 4.6 4.5  CL 110 114*  CO2 26 27  GLUCOSE 99 89  BUN 14 12  CREATININE 0.86 0.84  CALCIUM 8.8 8.2*   Liver Function Tests: No results found for this basename: AST, ALT, ALKPHOS, BILITOT, PROT, ALBUMIN,  in the last 72 hours   CBC: No results found for this basename: WBC, NEUTROABS, HGB, HCT, MCV, PLT,  in the last 72 hours  Recent  Results (from the past 240 hour(s))  CULTURE, BLOOD (ROUTINE X 2)     Status: None   Collection Time    04/11/13  4:25 PM      Result Value Ref Range Status   Specimen Description LEFT ANTECUBITAL   Final   Special Requests  BAA 6 CC EACH   Final   Culture NO GROWTH 4 DAYS   Final   Report Status PENDING   Incomplete  CULTURE, BLOOD (ROUTINE X 2)     Status: None   Collection Time    04/11/13  4:25 PM      Result Value Ref Range Status   Specimen Description BLOOD RIGHT ARM   Final   Special Requests AEB 4 CC, ANA 2 CC   Final   Culture NO GROWTH 4 DAYS   Final   Report Status PENDING   Incomplete  MRSA PCR SCREENING  Status: None   Collection Time    04/12/13 12:30 AM      Result Value Ref Range Status   MRSA by PCR NEGATIVE  NEGATIVE Final   Comment:            The GeneXpert MRSA Assay (FDA     approved for NASAL specimens     only), is one component of a     comprehensive MRSA colonization     surveillance program. It is not     intended to diagnose MRSA     infection nor to guide or     monitor treatment for     MRSA infections.     Hospital Course: He was admitted after having had syncope and was found to have pneumonia. He appeared to be septic. This was felt to be related to aspiration. He was treated with intravenous fluids IV antibiotics and IV steroids and improved. He had PT and speech evaluation. It was felt that he should be on a dysphagia 3 diet with nectar thick liquids. He had PT evaluation was felt to be back to baseline. His chest cleared and he appear to be back at his baseline level by the time of discharge  Discharge Exam: Blood pressure 110/63, pulse 64, temperature 98.3 F (36.8 C), temperature source Oral, resp. rate 20, height 5\' 9"  (1.753 m), weight 58.6 kg (129 lb 3 oz), SpO2 98.00%. He is awake and alert. His chest is much clearer. His heart is regular.  Disposition: Home with home health services he needs to be on a dysphagia 3 basically pured  diet with nectar thick liquids      Discharge Orders   Future Orders Complete By Expires   Discharge patient  As directed    Face-to-face encounter (required for Medicare/Medicaid patients)  As directed    Questions:     The encounter with the patient was in whole, or in part, for the following medical condition, which is the primary reason for home health care:  Pneumonia/malnutrition   I certify that, based on my findings, the following services are medically necessary home health services:  Nursing   My clinical findings support the need for the above services:  Cognitive impairments, dementia, or mental confusion  that make it unsafe to leave home   Further, I certify that my clinical findings support that this patient is homebound due to:  Mental confusion   Reason for Medically Necessary Home Health Services:  Skilled Nursing- Change/Decline in Patient [place]  As directed    Questions:     To provide the following care/treatments:  RN      Follow-up Information   Follow up with [facility].   Contact information:   [address] [place] [address] [phone]       Signed: Alonza Bogus   04/16/2013, 9:09 AM

## 2013-04-16 NOTE — Progress Notes (Signed)
Subjective: He feels better. He says he cannot walk. However it appears that he has had his prior level based on PT evaluation. Speech evaluation shows that he should be on a dysphagia 3 diet with nectar thick liquids. He is still a risk of aspiration  Objective: Vital signs in last 24 hours: Temp:  [98.1 F (36.7 C)-98.5 F (36.9 C)] 98.3 F (36.8 C) (04/08 0524) Pulse Rate:  [57-65] 64 (04/08 0524) Resp:  [15-20] 20 (04/08 0524) BP: (102-121)/(51-68) 110/63 mmHg (04/08 0524) SpO2:  [97 %-99 %] 98 % (04/08 0524) Weight change:     Intake/Output from previous day: 04/07 0701 - 04/08 0700 In: 1536.7 [P.O.:240; I.V.:1246.7; IV Piggyback:50] Out: 3500 [Urine:3500]  PHYSICAL EXAM General appearance: alert, cooperative and no distress Resp: rhonchi bilaterally Cardio: regular rate and rhythm, S1, S2 normal, no murmur, click, rub or gallop GI: soft, non-tender; bowel sounds normal; no masses,  no organomegaly Extremities: extremities normal, atraumatic, no cyanosis or edema  Lab Results:    Basic Metabolic Panel:  Recent Labs  04/14/13 0504 04/16/13 0459  NA 147 148*  K 4.6 4.5  CL 110 114*  CO2 26 27  GLUCOSE 99 89  BUN 14 12  CREATININE 0.86 0.84  CALCIUM 8.8 8.2*   Liver Function Tests: No results found for this basename: AST, ALT, ALKPHOS, BILITOT, PROT, ALBUMIN,  in the last 72 hours No results found for this basename: LIPASE, AMYLASE,  in the last 72 hours No results found for this basename: AMMONIA,  in the last 72 hours CBC: No results found for this basename: WBC, NEUTROABS, HGB, HCT, MCV, PLT,  in the last 72 hours Cardiac Enzymes: No results found for this basename: CKTOTAL, CKMB, CKMBINDEX, TROPONINI,  in the last 72 hours BNP: No results found for this basename: PROBNP,  in the last 72 hours D-Dimer: No results found for this basename: DDIMER,  in the last 72 hours CBG: No results found for this basename: GLUCAP,  in the last 72 hours Hemoglobin  A1C: No results found for this basename: HGBA1C,  in the last 72 hours Fasting Lipid Panel: No results found for this basename: CHOL, HDL, LDLCALC, TRIG, CHOLHDL, LDLDIRECT,  in the last 72 hours Thyroid Function Tests: No results found for this basename: TSH, T4TOTAL, FREET4, T3FREE, THYROIDAB,  in the last 72 hours Anemia Panel: No results found for this basename: VITAMINB12, FOLATE, FERRITIN, TIBC, IRON, RETICCTPCT,  in the last 72 hours Coagulation: No results found for this basename: LABPROT, INR,  in the last 72 hours Urine Drug Screen: Drugs of Abuse     Component Value Date/Time   LABOPIA NONE DETECTED 04/11/2013 1502   COCAINSCRNUR NONE DETECTED 04/11/2013 1502   LABBENZ NONE DETECTED 04/11/2013 1502   AMPHETMU NONE DETECTED 04/11/2013 1502   THCU NONE DETECTED 04/11/2013 1502   LABBARB NONE DETECTED 04/11/2013 1502    Alcohol Level: No results found for this basename: ETH,  in the last 72 hours Urinalysis: No results found for this basename: COLORURINE, APPERANCEUR, LABSPEC, PHURINE, GLUCOSEU, HGBUR, BILIRUBINUR, KETONESUR, PROTEINUR, UROBILINOGEN, NITRITE, LEUKOCYTESUR,  in the last 72 hours Misc. Labs:  ABGS No results found for this basename: PHART, PCO2, PO2ART, TCO2, HCO3,  in the last 72 hours CULTURES Recent Results (from the past 240 hour(s))  CULTURE, BLOOD (ROUTINE X 2)     Status: None   Collection Time    04/11/13  4:25 PM      Result Value Ref Range Status   Specimen  Description LEFT ANTECUBITAL   Final   Special Requests  BAA 6 CC EACH   Final   Culture NO GROWTH 4 DAYS   Final   Report Status PENDING   Incomplete  CULTURE, BLOOD (ROUTINE X 2)     Status: None   Collection Time    04/11/13  4:25 PM      Result Value Ref Range Status   Specimen Description BLOOD RIGHT ARM   Final   Special Requests AEB 4 CC, ANA 2 CC   Final   Culture NO GROWTH 4 DAYS   Final   Report Status PENDING   Incomplete  MRSA PCR SCREENING     Status: None   Collection Time     04/12/13 12:30 AM      Result Value Ref Range Status   MRSA by PCR NEGATIVE  NEGATIVE Final   Comment:            The GeneXpert MRSA Assay (FDA     approved for NASAL specimens     only), is one component of a     comprehensive MRSA colonization     surveillance program. It is not     intended to diagnose MRSA     infection nor to guide or     monitor treatment for     MRSA infections.   Studies/Results: No results found.  Medications:  Prior to Admission:  Prescriptions prior to admission  Medication Sig Dispense Refill  . docusate sodium (COLACE) 100 MG capsule Take 100 mg by mouth at bedtime as needed for mild constipation.      . fish oil-omega-3 fatty acids 1000 MG capsule Take 1 g by mouth 3 (three) times daily.      Marland Kitchen guaifenesin (ROBITUSSIN) 100 MG/5ML syrup Take 200 mg by mouth every 4 (four) hours as needed for cough.      . haloperidol (HALDOL) 10 MG tablet Take 10 mg by mouth every evening.      Marland Kitchen levothyroxine (SYNTHROID, LEVOTHROID) 100 MCG tablet Take 100 mcg by mouth daily before breakfast.      . lithium carbonate 300 MG capsule Take 300 mg by mouth 2 (two) times daily.      Marland Kitchen loratadine (CLARITIN) 10 MG tablet Take 10 mg by mouth daily.      Marland Kitchen lovastatin (MEVACOR) 20 MG tablet Take 20 mg by mouth daily.      . nicotine (NICODERM CQ - DOSED IN MG/24 HOURS) 21 mg/24hr patch Place 1 patch onto the skin daily.      Marland Kitchen oxybutynin (DITROPAN-XL) 10 MG 24 hr tablet Take 10 mg by mouth daily.      . polyethylene glycol powder (GLYCOLAX/MIRALAX) powder Take 17 g by mouth daily. Dissolved in 8 ounces of water/juice daily      . diphenoxylate-atropine (LOMOTIL) 2.5-0.025 MG per tablet Take 1 tablet by mouth 4 (four) times daily as needed for diarrhea or loose stools.  10 tablet  0  . haloperidol decanoate (HALDOL DECANOATE) 100 MG/ML injection Inject 100 mg into the muscle every 28 (twenty-eight) days. Administered by [facility]       Scheduled: . amoxicillin-clavulanate  1  tablet Oral Q12H  . antiseptic oral rinse  15 mL Mouth Rinse BID  . aspirin EC  325 mg Oral Daily  . enoxaparin (LOVENOX) injection  40 mg Subcutaneous Q24H  . feeding supplement (ENSURE)  1 Container Oral TID BM  . haloperidol  10 mg Oral QPM  .  levothyroxine  100 mcg Oral QAC breakfast  . lithium carbonate  300 mg Oral BID  . loratadine  10 mg Oral Daily  . nicotine  21 mg Transdermal Daily  . omega-3 acid ethyl esters  1 g Oral TID  . oxybutynin  10 mg Oral Daily  . polyethylene glycol  17 g Oral Daily  . simvastatin  10 mg Oral q1800  . sodium chloride  3 mL Intravenous Q12H   Continuous: . sodium chloride 100 mL/hr at 04/15/13 0950   ERD:EYCXKGYJEHUDJ, acetaminophen, diphenoxylate-atropine, docusate sodium, guaiFENesin, ipratropium-albuterol, ondansetron (ZOFRAN) IV, ondansetron, RESOURCE THICKENUP CLEAR  Assesment: He was admitted with pneumonia and sepsis but looks much better. He has severe protein calorie malnutrition. He has schizophrenia which complicates his treatment. It appears that his pneumonia was likely from aspiration. He is said to be at approximately baseline as far as physical therapy is concerned Principal Problem:   Sepsis Active Problems:   H/O stroke within last year   Hypothyroidism   Pneumonia   Thrombocytopenia   Schizophrenia   Malnutrition   SIRS (systemic inflammatory response syndrome)   Protein-calorie malnutrition, severe    Plan: Discharge  home    LOS: 5 days   Steven David 04/16/2013, 8:41 AM

## 2013-04-16 NOTE — Progress Notes (Signed)
UR chart review completed.  

## 2013-04-16 NOTE — Progress Notes (Signed)
Physical Therapy Discharge Patient Details Name: Steven David MRN: [id number] DOB: Oct 22, 1955 Today's Date: 04/16/2013 Time:  2633- 0915    Patient discharged from PT services secondary to pt was evaluated yesterday and found to be at prior functional level.  Please see latest therapy progress note for current level of functioning and progress toward goals.      GP     Leeroy Cha 04/16/2013, 9:17 AM

## 2013-04-16 NOTE — Plan of Care (Signed)
Problem: Discharge Progression Outcomes Goal: Tolerating diet Outcome: Completed/Met Date Met:  04/16/13 Nectar thick liquids

## 2013-04-16 NOTE — Progress Notes (Signed)
Discharge instructions read to [facility] care facility representative All questions answered to her satisfaction

## 2013-04-18 DIAGNOSIS — R1310 Dysphagia, unspecified: Secondary | ICD-10-CM | POA: Diagnosis not present

## 2013-04-18 DIAGNOSIS — Z8673 Personal history of transient ischemic attack (TIA), and cerebral infarction without residual deficits: Secondary | ICD-10-CM | POA: Diagnosis not present

## 2013-04-18 DIAGNOSIS — F209 Schizophrenia, unspecified: Secondary | ICD-10-CM | POA: Diagnosis not present

## 2013-04-18 DIAGNOSIS — J189 Pneumonia, unspecified organism: Secondary | ICD-10-CM | POA: Diagnosis not present

## 2013-04-18 DIAGNOSIS — D696 Thrombocytopenia, unspecified: Secondary | ICD-10-CM | POA: Diagnosis not present

## 2013-04-18 DIAGNOSIS — E43 Unspecified severe protein-calorie malnutrition: Secondary | ICD-10-CM | POA: Diagnosis not present

## 2013-04-18 DIAGNOSIS — R55 Syncope and collapse: Secondary | ICD-10-CM | POA: Diagnosis not present

## 2013-04-19 NOTE — Procedures (Signed)
  Steven A. Merlene Laughter, MD     [email or web address]           HISTORY: This is a 58 year old man who presents with confusion and lethargy per this has been done to evaluate for nonconvulsive seizures.  MEDICATIONS: Scheduled Meds: Continuous Infusions: PRN Meds:.    Prior to Admission medications   Medication Sig Start Date End Date Taking? Authorizing Provider  amoxicillin-clavulanate (AUGMENTIN) 875-125 MG per tablet Take 1 tablet by mouth every 12 (twelve) hours. 04/16/13   Alonza Bogus, MD  diphenoxylate-atropine (LOMOTIL) 2.5-0.025 MG per tablet Take 1 tablet by mouth 4 (four) times daily as needed for diarrhea or loose stools. 09/29/12   Maudry Diego, MD  docusate sodium (COLACE) 100 MG capsule Take 100 mg by mouth at bedtime as needed for mild constipation.    Historical Provider, MD  feeding supplement, ENSURE, (ENSURE) PUDG Take 1 Container by mouth 3 (three) times daily between meals. 04/16/13   Alonza Bogus, MD  fish oil-omega-3 fatty acids 1000 MG capsule Take 1 g by mouth 3 (three) times daily.    Historical Provider, MD  guaifenesin (ROBITUSSIN) 100 MG/5ML syrup Take 200 mg by mouth every 4 (four) hours as needed for cough.    Historical Provider, MD  haloperidol (HALDOL) 10 MG tablet Take 10 mg by mouth every evening.    Historical Provider, MD  haloperidol decanoate (HALDOL DECANOATE) 100 MG/ML injection Inject 100 mg into the muscle every 28 (twenty-eight) days. Administered by [facility]    Historical Provider, MD  levothyroxine (SYNTHROID, LEVOTHROID) 100 MCG tablet Take 100 mcg by mouth daily before breakfast.    Historical Provider, MD  lithium carbonate 300 MG capsule Take 300 mg by mouth 2 (two) times daily.    Historical Provider, MD  loratadine (CLARITIN) 10 MG tablet Take 10 mg by mouth daily.    Historical Provider, MD  lovastatin (MEVACOR) 20 MG tablet Take 20 mg by mouth daily.    Historical Provider, MD  Maltodextrin-Xanthan Gum  ([place]) POWD Take 1 Container by mouth as needed (Used to make liquids nectar thick). 04/16/13   Alonza Bogus, MD  nicotine (NICODERM CQ - DOSED IN MG/24 HOURS) 21 mg/24hr patch Place 1 patch onto the skin daily.    Historical Provider, MD  oxybutynin (DITROPAN-XL) 10 MG 24 hr tablet Take 10 mg by mouth daily.    Historical Provider, MD  polyethylene glycol powder (GLYCOLAX/MIRALAX) powder Take 17 g by mouth daily. Dissolved in 8 ounces of water/juice daily    Historical Provider, MD      ANALYSIS: A 16 channel recording using standard 10 20 measurements is conducted for 21 minutes. The background activity shows a low voltage rhythm of 16 Hz. There is beta activity also observed in the frontal areas. Awake and drowsy activities are recorded. Photic stimulation and hyperventilation were not carried out. There is no focal or lateralized slowing. There is no epileptiform activity observed.    IMPRESSION: 1. This is an unremarkable recording awake and drowsy states.      Austyn Seier A. Merlene David, M.D.  Diplomate, Tax adviser of Psychiatry and Neurology ( Neurology).

## 2013-04-21 DIAGNOSIS — F209 Schizophrenia, unspecified: Secondary | ICD-10-CM | POA: Diagnosis not present

## 2013-04-21 DIAGNOSIS — R55 Syncope and collapse: Secondary | ICD-10-CM | POA: Diagnosis not present

## 2013-04-21 DIAGNOSIS — D696 Thrombocytopenia, unspecified: Secondary | ICD-10-CM | POA: Diagnosis not present

## 2013-04-21 DIAGNOSIS — E43 Unspecified severe protein-calorie malnutrition: Secondary | ICD-10-CM | POA: Diagnosis not present

## 2013-04-21 DIAGNOSIS — J189 Pneumonia, unspecified organism: Secondary | ICD-10-CM | POA: Diagnosis not present

## 2013-04-21 DIAGNOSIS — R1310 Dysphagia, unspecified: Secondary | ICD-10-CM | POA: Diagnosis not present

## 2013-04-23 ENCOUNTER — Emergency Department (HOSPITAL_COMMUNITY): Payer: Medicare Other

## 2013-04-23 ENCOUNTER — Emergency Department (HOSPITAL_COMMUNITY)
Admission: EM | Admit: 2013-04-23 | Discharge: 2013-04-23 | Disposition: A | Discharge status: Home / Self Care | Payer: Medicare Other | Attending: Emergency Medicine | Admitting: Emergency Medicine

## 2013-04-23 ENCOUNTER — Encounter (HOSPITAL_COMMUNITY): Payer: Self-pay | Admitting: Emergency Medicine

## 2013-04-23 DIAGNOSIS — R627 Adult failure to thrive: Secondary | ICD-10-CM | POA: Diagnosis not present

## 2013-04-23 DIAGNOSIS — Z79899 Other long term (current) drug therapy: Secondary | ICD-10-CM | POA: Diagnosis not present

## 2013-04-23 DIAGNOSIS — Z8659 Personal history of other mental and behavioral disorders: Secondary | ICD-10-CM | POA: Diagnosis not present

## 2013-04-23 DIAGNOSIS — E7800 Pure hypercholesterolemia, unspecified: Secondary | ICD-10-CM | POA: Insufficient documentation

## 2013-04-23 DIAGNOSIS — IMO0002 Reserved for concepts with insufficient information to code with codable children: Secondary | ICD-10-CM

## 2013-04-23 DIAGNOSIS — R109 Unspecified abdominal pain: Secondary | ICD-10-CM | POA: Diagnosis not present

## 2013-04-23 DIAGNOSIS — R259 Unspecified abnormal involuntary movements: Secondary | ICD-10-CM | POA: Diagnosis not present

## 2013-04-23 DIAGNOSIS — R634 Abnormal weight loss: Secondary | ICD-10-CM | POA: Diagnosis not present

## 2013-04-23 DIAGNOSIS — Z8719 Personal history of other diseases of the digestive system: Secondary | ICD-10-CM | POA: Insufficient documentation

## 2013-04-23 DIAGNOSIS — Z8669 Personal history of other diseases of the nervous system and sense organs: Secondary | ICD-10-CM | POA: Diagnosis not present

## 2013-04-23 DIAGNOSIS — E079 Disorder of thyroid, unspecified: Secondary | ICD-10-CM | POA: Diagnosis not present

## 2013-04-23 DIAGNOSIS — Z87891 Personal history of nicotine dependence: Secondary | ICD-10-CM | POA: Diagnosis not present

## 2013-04-23 DIAGNOSIS — R51 Headache: Secondary | ICD-10-CM | POA: Diagnosis not present

## 2013-04-23 DIAGNOSIS — J449 Chronic obstructive pulmonary disease, unspecified: Secondary | ICD-10-CM | POA: Diagnosis not present

## 2013-04-23 LAB — URINALYSIS, ROUTINE W REFLEX MICROSCOPIC
Bilirubin Urine: NEGATIVE
GLUCOSE, UA: NEGATIVE mg/dL
Hgb urine dipstick: NEGATIVE
KETONES UR: NEGATIVE mg/dL
Leukocytes, UA: NEGATIVE
Nitrite: NEGATIVE
PH: 7 (ref 5.0–8.0)
PROTEIN: NEGATIVE mg/dL
Specific Gravity, Urine: 1.01 (ref 1.005–1.030)
Urobilinogen, UA: 0.2 mg/dL (ref 0.0–1.0)

## 2013-04-23 LAB — CBC WITH DIFFERENTIAL/PLATELET
BASOS PCT: 0 % (ref 0–1)
Basophils Absolute: 0.1 10*3/uL (ref 0.0–0.1)
Eosinophils Absolute: 0.1 10*3/uL (ref 0.0–0.7)
Eosinophils Relative: 1 % (ref 0–5)
HEMATOCRIT: 36.1 % — AB (ref 39.0–52.0)
HEMOGLOBIN: 11.5 g/dL — AB (ref 13.0–17.0)
LYMPHS ABS: 1.1 10*3/uL (ref 0.7–4.0)
LYMPHS PCT: 7 % — AB (ref 12–46)
MCH: 28.8 pg (ref 26.0–34.0)
MCHC: 31.9 g/dL (ref 30.0–36.0)
MCV: 90.3 fL (ref 78.0–100.0)
MONOS PCT: 5 % (ref 3–12)
Monocytes Absolute: 0.7 10*3/uL (ref 0.1–1.0)
NEUTROS ABS: 12.6 10*3/uL — AB (ref 1.7–7.7)
Neutrophils Relative %: 87 % — ABNORMAL HIGH (ref 43–77)
Platelets: 166 10*3/uL (ref 150–400)
RBC: 4 MIL/uL — AB (ref 4.22–5.81)
RDW: 15.3 % (ref 11.5–15.5)
WBC: 14.5 10*3/uL — AB (ref 4.0–10.5)

## 2013-04-23 LAB — COMPREHENSIVE METABOLIC PANEL
ALBUMIN: 3.5 g/dL (ref 3.5–5.2)
ALK PHOS: 68 U/L (ref 39–117)
ALT: 12 U/L (ref 0–53)
AST: 17 U/L (ref 0–37)
BUN: 19 mg/dL (ref 6–23)
CHLORIDE: 104 meq/L (ref 96–112)
CO2: 30 meq/L (ref 19–32)
Calcium: 9.9 mg/dL (ref 8.4–10.5)
Creatinine, Ser: 0.98 mg/dL (ref 0.50–1.35)
GFR calc Af Amer: >90 mL/min (ref >90)
Glucose, Bld: 99 mg/dL (ref 70–99)
POTASSIUM: 4.3 meq/L (ref 3.7–5.3)
Sodium: 142 mEq/L (ref 137–147)
Total Bilirubin: 0.3 mg/dL (ref 0.3–1.2)
Total Protein: 8.1 g/dL (ref 6.0–8.3)

## 2013-04-23 LAB — LIPASE, BLOOD: Lipase: 18 U/L (ref 11–59)

## 2013-04-23 MED ORDER — SODIUM CHLORIDE 0.9 % IV BOLUS (SEPSIS)
1000.0000 mL | Freq: Once | INTRAVENOUS | Status: AC
  Administered 2013-04-23: 1000 mL via INTRAVENOUS

## 2013-04-23 NOTE — Discharge Instructions (Signed)
Screening tests here were good. He was able to eat.  Followup with primary care Dr.

## 2013-04-23 NOTE — ED Notes (Signed)
Per ems, pt from [facility] and was just d/c from here a week ago for pna.  Per staff at [facility], pt has been eating well but still losing weight.  Per staff pt began c/o abd pain today.

## 2013-04-23 NOTE — ED Provider Notes (Signed)
CSN: [id number]     Arrival date & time 04/23/13  1001 History  This chart was scribed for Nat Christen, MD by Ludger Nutting, ED Scribe. This patient was seen in room APA02/APA02 and the patient's care was started 10:27 AM.    Chief Complaint  Patient presents with  . Abdominal Pain  . Weight Loss      The history is provided by the patient. The history is limited by the condition of the patient. No language interpreter was used.   LEVEL 5 CAVEAT- Schizophrenia  HPI Comments: Steven David is a 58 y.o. male who presents to the Emergency Department complaining of abdominal pain with radiation to the chest that began today. Patient also reports having associated HA, and chills. Patient is a resident at [facility] and was discharged from here 1 week ago for pneumonia. Severity is mild. Nothing makes symptoms better or worse.  Past Medical History  Diagnosis Date  . Schizophrenia   . Hernia   . Thyroid disease   . High cholesterol   . HOH (hard of hearing)    Past Surgical History  Procedure Laterality Date  . Leg surgery    . Testicle surgery     No family history on file. History  Substance Use Topics  . Smoking status: Former Research scientist (life sciences)  . Smokeless tobacco: Not on file  . Alcohol Use: No    Review of Systems  Unable to perform ROS: Psychiatric disorder      Allergies  Review of patient's allergies indicates no known allergies.  Home Medications   Prior to Admission medications   Medication Sig Start Date End Date Taking? Authorizing Provider  amoxicillin-clavulanate (AUGMENTIN) 875-125 MG per tablet Take 1 tablet by mouth every 12 (twelve) hours. 04/16/13   Alonza Bogus, MD  diphenoxylate-atropine (LOMOTIL) 2.5-0.025 MG per tablet Take 1 tablet by mouth 4 (four) times daily as needed for diarrhea or loose stools. 09/29/12   Maudry Diego, MD  docusate sodium (COLACE) 100 MG capsule Take 100 mg by mouth at bedtime as needed for mild constipation.     Historical Provider, MD  feeding supplement, ENSURE, (ENSURE) PUDG Take 1 Container by mouth 3 (three) times daily between meals. 04/16/13   Alonza Bogus, MD  fish oil-omega-3 fatty acids 1000 MG capsule Take 1 g by mouth 3 (three) times daily.    Historical Provider, MD  guaifenesin (ROBITUSSIN) 100 MG/5ML syrup Take 200 mg by mouth every 4 (four) hours as needed for cough.    Historical Provider, MD  haloperidol (HALDOL) 10 MG tablet Take 10 mg by mouth every evening.    Historical Provider, MD  haloperidol decanoate (HALDOL DECANOATE) 100 MG/ML injection Inject 100 mg into the muscle every 28 (twenty-eight) days. Administered by [facility]    Historical Provider, MD  levothyroxine (SYNTHROID, LEVOTHROID) 100 MCG tablet Take 100 mcg by mouth daily before breakfast.    Historical Provider, MD  lithium carbonate 300 MG capsule Take 300 mg by mouth 2 (two) times daily.    Historical Provider, MD  loratadine (CLARITIN) 10 MG tablet Take 10 mg by mouth daily.    Historical Provider, MD  lovastatin (MEVACOR) 20 MG tablet Take 20 mg by mouth daily.    Historical Provider, MD  Maltodextrin-Xanthan Gum ([place]) POWD Take 1 Container by mouth as needed (Used to make liquids nectar thick). 04/16/13   Alonza Bogus, MD  nicotine (NICODERM CQ - DOSED IN MG/24 HOURS) 21  mg/24hr patch Place 1 patch onto the skin daily.    Historical Provider, MD  oxybutynin (DITROPAN-XL) 10 MG 24 hr tablet Take 10 mg by mouth daily.    Historical Provider, MD  polyethylene glycol powder (GLYCOLAX/MIRALAX) powder Take 17 g by mouth daily. Dissolved in 8 ounces of water/juice daily    Historical Provider, MD   BP 132/57  Pulse 69  Temp(Src) 98.1 F (36.7 C) (Oral)  Resp 19  SpO2 99% Physical Exam  Nursing note and vitals reviewed. Constitutional: He is oriented to person, place, and time. He appears well-developed and well-nourished.  HENT:  Head: Normocephalic and atraumatic.  Eyes: Conjunctivae and EOM  are normal. Pupils are equal, round, and reactive to light.  Neck: Normal range of motion. Neck supple.  Cardiovascular: Normal rate, regular rhythm and normal heart sounds.   Pulmonary/Chest: Effort normal and breath sounds normal.  Abdominal: Soft. Bowel sounds are normal.  Musculoskeletal: Normal range of motion.  Neurological: He is alert and oriented to person, place, and time.  Skin: Skin is warm and dry.  Psychiatric: He has a normal mood and affect. His behavior is normal.    ED Course  Procedures (including critical care time)  DIAGNOSTIC STUDIES: Oxygen Saturation is 99% on RA, normal by my interpretation.    COORDINATION OF CARE: 10:28 AM Discussed treatment plan with pt at bedside and pt agreed to plan.   Labs Review Labs Reviewed  CBC WITH DIFFERENTIAL - Abnormal; Notable for the following:    WBC 14.5 (*)    RBC 4.00 (*)    Hemoglobin 11.5 (*)    HCT 36.1 (*)    Neutrophils Relative % 87 (*)    Neutro Abs 12.6 (*)    Lymphocytes Relative 7 (*)    All other components within normal limits  COMPREHENSIVE METABOLIC PANEL  LIPASE, BLOOD  URINALYSIS, ROUTINE W REFLEX MICROSCOPIC    Imaging Review Dg Chest 2 View  04/23/2013   CLINICAL DATA:  Cough and congestion. And weight loss with abdominal pain  EXAM: CHEST  2 VIEW  COMPARISON:  DG CHEST 1 VIEW dated 04/11/2013; CT CHEST W/CM dated 09/19/2012  FINDINGS: The lungs are mildly hyperinflated consistent with COPD. There is no evidence of pneumonia nor atelectasis. The cardiopericardial silhouette is normal in size. The pulmonary vascularity is not engorged. The mediastinum is normal in width. There is no pleural effusion or pneumothorax. The observed portions of the bony thorax appear normal.  IMPRESSION: There is hyperinflation consistent with COPD. There is no evidence of pneumonia.   Electronically Signed   By: David  [place]   On: 04/23/2013 11:28     EKG Interpretation   Date/Time:  Wednesday April 23 2013  09:58:30 EDT Ventricular Rate:  55 PR Interval:  160 QRS Duration: 102 QT Interval:  464 QTC Calculation: 443 R Axis:   55 Text Interpretation:  Sinus bradycardia Otherwise normal ECG When compared  with ECG of 11-Apr-2013 13:54, Vent. rate has decreased BY  48 BPM QRS  duration has decreased Borderline criteria for Lateral infarct are no  longer Present ST no longer elevated in Anterior leads T wave inversion no  longer evident in Inferior leads T wave inversion no longer evident in  Lateral leads QT has shortened Confirmed by Eliseo Withers  MD, Jera Headings ([id number]) on  04/23/2013 12:30:25 PM      MDM   Final diagnoses:  Failure to thrive    Patient appears in no acute distress. Vital signs normal. Screening  tests were normal. He is able to eat. Elevated white count noted but of questionable significance.  He appears nontoxic.  I personally performed the services described in this documentation, which was scribed in my presence. The recorded information has been reviewed and is accurate.   Nat Christen, MD 04/23/13 [phone]

## 2013-04-24 DIAGNOSIS — E43 Unspecified severe protein-calorie malnutrition: Secondary | ICD-10-CM | POA: Diagnosis not present

## 2013-04-24 DIAGNOSIS — F209 Schizophrenia, unspecified: Secondary | ICD-10-CM | POA: Diagnosis not present

## 2013-04-24 DIAGNOSIS — R55 Syncope and collapse: Secondary | ICD-10-CM | POA: Diagnosis not present

## 2013-04-24 DIAGNOSIS — J189 Pneumonia, unspecified organism: Secondary | ICD-10-CM | POA: Diagnosis not present

## 2013-04-24 DIAGNOSIS — R1310 Dysphagia, unspecified: Secondary | ICD-10-CM | POA: Diagnosis not present

## 2013-04-24 DIAGNOSIS — Z125 Encounter for screening for malignant neoplasm of prostate: Secondary | ICD-10-CM | POA: Diagnosis not present

## 2013-04-24 DIAGNOSIS — D696 Thrombocytopenia, unspecified: Secondary | ICD-10-CM | POA: Diagnosis not present

## 2013-04-24 DIAGNOSIS — R634 Abnormal weight loss: Secondary | ICD-10-CM | POA: Diagnosis not present

## 2013-04-25 DIAGNOSIS — R55 Syncope and collapse: Secondary | ICD-10-CM | POA: Diagnosis not present

## 2013-04-25 DIAGNOSIS — D696 Thrombocytopenia, unspecified: Secondary | ICD-10-CM | POA: Diagnosis not present

## 2013-04-25 DIAGNOSIS — F209 Schizophrenia, unspecified: Secondary | ICD-10-CM | POA: Diagnosis not present

## 2013-04-25 DIAGNOSIS — E43 Unspecified severe protein-calorie malnutrition: Secondary | ICD-10-CM | POA: Diagnosis not present

## 2013-04-25 DIAGNOSIS — J189 Pneumonia, unspecified organism: Secondary | ICD-10-CM | POA: Diagnosis not present

## 2013-04-25 DIAGNOSIS — R1310 Dysphagia, unspecified: Secondary | ICD-10-CM | POA: Diagnosis not present

## 2013-04-28 DIAGNOSIS — R1310 Dysphagia, unspecified: Secondary | ICD-10-CM | POA: Diagnosis not present

## 2013-04-28 DIAGNOSIS — R55 Syncope and collapse: Secondary | ICD-10-CM | POA: Diagnosis not present

## 2013-04-28 DIAGNOSIS — E43 Unspecified severe protein-calorie malnutrition: Secondary | ICD-10-CM | POA: Diagnosis not present

## 2013-04-28 DIAGNOSIS — J189 Pneumonia, unspecified organism: Secondary | ICD-10-CM | POA: Diagnosis not present

## 2013-04-28 DIAGNOSIS — D696 Thrombocytopenia, unspecified: Secondary | ICD-10-CM | POA: Diagnosis not present

## 2013-04-28 DIAGNOSIS — F209 Schizophrenia, unspecified: Secondary | ICD-10-CM | POA: Diagnosis not present

## 2013-04-29 ENCOUNTER — Other Ambulatory Visit (HOSPITAL_COMMUNITY): Payer: Self-pay | Admitting: Pulmonary Disease

## 2013-04-29 DIAGNOSIS — R634 Abnormal weight loss: Secondary | ICD-10-CM

## 2013-04-29 DIAGNOSIS — J189 Pneumonia, unspecified organism: Secondary | ICD-10-CM

## 2013-04-30 DIAGNOSIS — E43 Unspecified severe protein-calorie malnutrition: Secondary | ICD-10-CM | POA: Diagnosis not present

## 2013-04-30 DIAGNOSIS — D696 Thrombocytopenia, unspecified: Secondary | ICD-10-CM | POA: Diagnosis not present

## 2013-04-30 DIAGNOSIS — R55 Syncope and collapse: Secondary | ICD-10-CM | POA: Diagnosis not present

## 2013-04-30 DIAGNOSIS — F209 Schizophrenia, unspecified: Secondary | ICD-10-CM | POA: Diagnosis not present

## 2013-04-30 DIAGNOSIS — R1310 Dysphagia, unspecified: Secondary | ICD-10-CM | POA: Diagnosis not present

## 2013-04-30 DIAGNOSIS — J189 Pneumonia, unspecified organism: Secondary | ICD-10-CM | POA: Diagnosis not present

## 2013-05-01 ENCOUNTER — Ambulatory Visit (HOSPITAL_COMMUNITY)
Admission: RE | Admit: 2013-05-01 | Discharge: 2013-05-01 | Disposition: A | Discharge status: Home / Self Care | Payer: Medicare Other | Source: Ambulatory Visit | Attending: Pulmonary Disease | Admitting: Pulmonary Disease

## 2013-05-01 ENCOUNTER — Encounter (HOSPITAL_COMMUNITY): Payer: Self-pay

## 2013-05-01 ENCOUNTER — Other Ambulatory Visit (HOSPITAL_COMMUNITY): Payer: Medicare Other

## 2013-05-01 DIAGNOSIS — R634 Abnormal weight loss: Secondary | ICD-10-CM | POA: Diagnosis not present

## 2013-05-01 DIAGNOSIS — F2089 Other schizophrenia: Secondary | ICD-10-CM | POA: Diagnosis not present

## 2013-05-01 DIAGNOSIS — J189 Pneumonia, unspecified organism: Secondary | ICD-10-CM | POA: Insufficient documentation

## 2013-05-01 DIAGNOSIS — K461 Unspecified abdominal hernia with gangrene: Secondary | ICD-10-CM | POA: Diagnosis not present

## 2013-05-01 MED ORDER — IOHEXOL 300 MG/ML  SOLN
100.0000 mL | Freq: Once | INTRAMUSCULAR | Status: AC | PRN
  Administered 2013-05-01: 100 mL via INTRAVENOUS

## 2013-05-02 DIAGNOSIS — D696 Thrombocytopenia, unspecified: Secondary | ICD-10-CM | POA: Diagnosis not present

## 2013-05-02 DIAGNOSIS — R1310 Dysphagia, unspecified: Secondary | ICD-10-CM | POA: Diagnosis not present

## 2013-05-02 DIAGNOSIS — F209 Schizophrenia, unspecified: Secondary | ICD-10-CM | POA: Diagnosis not present

## 2013-05-02 DIAGNOSIS — R55 Syncope and collapse: Secondary | ICD-10-CM | POA: Diagnosis not present

## 2013-05-02 DIAGNOSIS — J189 Pneumonia, unspecified organism: Secondary | ICD-10-CM | POA: Diagnosis not present

## 2013-05-02 DIAGNOSIS — E43 Unspecified severe protein-calorie malnutrition: Secondary | ICD-10-CM | POA: Diagnosis not present

## 2013-05-05 DIAGNOSIS — J189 Pneumonia, unspecified organism: Secondary | ICD-10-CM | POA: Diagnosis not present

## 2013-05-05 DIAGNOSIS — E43 Unspecified severe protein-calorie malnutrition: Secondary | ICD-10-CM | POA: Diagnosis not present

## 2013-05-05 DIAGNOSIS — D696 Thrombocytopenia, unspecified: Secondary | ICD-10-CM | POA: Diagnosis not present

## 2013-05-05 DIAGNOSIS — R55 Syncope and collapse: Secondary | ICD-10-CM | POA: Diagnosis not present

## 2013-05-05 DIAGNOSIS — R1310 Dysphagia, unspecified: Secondary | ICD-10-CM | POA: Diagnosis not present

## 2013-05-05 DIAGNOSIS — F209 Schizophrenia, unspecified: Secondary | ICD-10-CM | POA: Diagnosis not present

## 2013-05-06 DIAGNOSIS — R55 Syncope and collapse: Secondary | ICD-10-CM | POA: Diagnosis not present

## 2013-05-06 DIAGNOSIS — J189 Pneumonia, unspecified organism: Secondary | ICD-10-CM | POA: Diagnosis not present

## 2013-05-06 DIAGNOSIS — D696 Thrombocytopenia, unspecified: Secondary | ICD-10-CM | POA: Diagnosis not present

## 2013-05-06 DIAGNOSIS — F209 Schizophrenia, unspecified: Secondary | ICD-10-CM | POA: Diagnosis not present

## 2013-05-06 DIAGNOSIS — R1310 Dysphagia, unspecified: Secondary | ICD-10-CM | POA: Diagnosis not present

## 2013-05-06 DIAGNOSIS — E43 Unspecified severe protein-calorie malnutrition: Secondary | ICD-10-CM | POA: Diagnosis not present

## 2013-05-07 DIAGNOSIS — E43 Unspecified severe protein-calorie malnutrition: Secondary | ICD-10-CM | POA: Diagnosis not present

## 2013-05-07 DIAGNOSIS — R55 Syncope and collapse: Secondary | ICD-10-CM | POA: Diagnosis not present

## 2013-05-07 DIAGNOSIS — R1310 Dysphagia, unspecified: Secondary | ICD-10-CM | POA: Diagnosis not present

## 2013-05-07 DIAGNOSIS — D696 Thrombocytopenia, unspecified: Secondary | ICD-10-CM | POA: Diagnosis not present

## 2013-05-07 DIAGNOSIS — F209 Schizophrenia, unspecified: Secondary | ICD-10-CM | POA: Diagnosis not present

## 2013-05-07 DIAGNOSIS — J189 Pneumonia, unspecified organism: Secondary | ICD-10-CM | POA: Diagnosis not present

## 2013-05-13 DIAGNOSIS — E43 Unspecified severe protein-calorie malnutrition: Secondary | ICD-10-CM | POA: Diagnosis not present

## 2013-05-13 DIAGNOSIS — F209 Schizophrenia, unspecified: Secondary | ICD-10-CM | POA: Diagnosis not present

## 2013-05-13 DIAGNOSIS — D696 Thrombocytopenia, unspecified: Secondary | ICD-10-CM | POA: Diagnosis not present

## 2013-05-13 DIAGNOSIS — J189 Pneumonia, unspecified organism: Secondary | ICD-10-CM | POA: Diagnosis not present

## 2013-05-13 DIAGNOSIS — R55 Syncope and collapse: Secondary | ICD-10-CM | POA: Diagnosis not present

## 2013-05-13 DIAGNOSIS — R1310 Dysphagia, unspecified: Secondary | ICD-10-CM | POA: Diagnosis not present

## 2013-05-15 ENCOUNTER — Other Ambulatory Visit (HOSPITAL_COMMUNITY): Payer: Self-pay | Admitting: Pulmonary Disease

## 2013-05-15 DIAGNOSIS — R1310 Dysphagia, unspecified: Secondary | ICD-10-CM

## 2013-05-15 DIAGNOSIS — F209 Schizophrenia, unspecified: Secondary | ICD-10-CM | POA: Diagnosis not present

## 2013-05-15 DIAGNOSIS — D696 Thrombocytopenia, unspecified: Secondary | ICD-10-CM | POA: Diagnosis not present

## 2013-05-15 DIAGNOSIS — R55 Syncope and collapse: Secondary | ICD-10-CM | POA: Diagnosis not present

## 2013-05-15 DIAGNOSIS — J189 Pneumonia, unspecified organism: Secondary | ICD-10-CM | POA: Diagnosis not present

## 2013-05-15 DIAGNOSIS — E43 Unspecified severe protein-calorie malnutrition: Secondary | ICD-10-CM | POA: Diagnosis not present

## 2013-05-19 DIAGNOSIS — J189 Pneumonia, unspecified organism: Secondary | ICD-10-CM | POA: Diagnosis not present

## 2013-05-19 DIAGNOSIS — R55 Syncope and collapse: Secondary | ICD-10-CM | POA: Diagnosis not present

## 2013-05-19 DIAGNOSIS — E43 Unspecified severe protein-calorie malnutrition: Secondary | ICD-10-CM | POA: Diagnosis not present

## 2013-05-19 DIAGNOSIS — D696 Thrombocytopenia, unspecified: Secondary | ICD-10-CM | POA: Diagnosis not present

## 2013-05-19 DIAGNOSIS — F209 Schizophrenia, unspecified: Secondary | ICD-10-CM | POA: Diagnosis not present

## 2013-05-19 DIAGNOSIS — R1310 Dysphagia, unspecified: Secondary | ICD-10-CM | POA: Diagnosis not present

## 2013-05-21 DIAGNOSIS — D696 Thrombocytopenia, unspecified: Secondary | ICD-10-CM | POA: Diagnosis not present

## 2013-05-21 DIAGNOSIS — E43 Unspecified severe protein-calorie malnutrition: Secondary | ICD-10-CM | POA: Diagnosis not present

## 2013-05-21 DIAGNOSIS — R1310 Dysphagia, unspecified: Secondary | ICD-10-CM | POA: Diagnosis not present

## 2013-05-21 DIAGNOSIS — J189 Pneumonia, unspecified organism: Secondary | ICD-10-CM | POA: Diagnosis not present

## 2013-05-21 DIAGNOSIS — F209 Schizophrenia, unspecified: Secondary | ICD-10-CM | POA: Diagnosis not present

## 2013-05-21 DIAGNOSIS — R55 Syncope and collapse: Secondary | ICD-10-CM | POA: Diagnosis not present

## 2013-05-26 ENCOUNTER — Ambulatory Visit (HOSPITAL_COMMUNITY): Payer: Medicare Other | Admitting: Speech Pathology

## 2013-05-26 ENCOUNTER — Other Ambulatory Visit (HOSPITAL_COMMUNITY): Payer: Medicare Other

## 2013-05-26 DIAGNOSIS — R1310 Dysphagia, unspecified: Secondary | ICD-10-CM | POA: Diagnosis not present

## 2013-05-26 DIAGNOSIS — R55 Syncope and collapse: Secondary | ICD-10-CM | POA: Diagnosis not present

## 2013-05-26 DIAGNOSIS — J189 Pneumonia, unspecified organism: Secondary | ICD-10-CM | POA: Diagnosis not present

## 2013-05-26 DIAGNOSIS — IMO0001 Reserved for inherently not codable concepts without codable children: Secondary | ICD-10-CM | POA: Insufficient documentation

## 2013-05-26 DIAGNOSIS — E43 Unspecified severe protein-calorie malnutrition: Secondary | ICD-10-CM | POA: Diagnosis not present

## 2013-05-26 DIAGNOSIS — D696 Thrombocytopenia, unspecified: Secondary | ICD-10-CM | POA: Diagnosis not present

## 2013-05-26 DIAGNOSIS — F209 Schizophrenia, unspecified: Secondary | ICD-10-CM | POA: Diagnosis not present

## 2013-05-27 ENCOUNTER — Other Ambulatory Visit (HOSPITAL_COMMUNITY): Payer: Self-pay | Admitting: Pulmonary Disease

## 2013-05-27 DIAGNOSIS — D696 Thrombocytopenia, unspecified: Secondary | ICD-10-CM | POA: Diagnosis not present

## 2013-05-27 DIAGNOSIS — R55 Syncope and collapse: Secondary | ICD-10-CM | POA: Diagnosis not present

## 2013-05-27 DIAGNOSIS — R1310 Dysphagia, unspecified: Secondary | ICD-10-CM

## 2013-05-27 DIAGNOSIS — J189 Pneumonia, unspecified organism: Secondary | ICD-10-CM | POA: Diagnosis not present

## 2013-05-27 DIAGNOSIS — F209 Schizophrenia, unspecified: Secondary | ICD-10-CM | POA: Diagnosis not present

## 2013-05-27 DIAGNOSIS — E43 Unspecified severe protein-calorie malnutrition: Secondary | ICD-10-CM | POA: Diagnosis not present

## 2013-05-28 DIAGNOSIS — J189 Pneumonia, unspecified organism: Secondary | ICD-10-CM | POA: Diagnosis not present

## 2013-05-28 DIAGNOSIS — R1310 Dysphagia, unspecified: Secondary | ICD-10-CM | POA: Diagnosis not present

## 2013-05-28 DIAGNOSIS — D696 Thrombocytopenia, unspecified: Secondary | ICD-10-CM | POA: Diagnosis not present

## 2013-05-28 DIAGNOSIS — E43 Unspecified severe protein-calorie malnutrition: Secondary | ICD-10-CM | POA: Diagnosis not present

## 2013-05-28 DIAGNOSIS — F209 Schizophrenia, unspecified: Secondary | ICD-10-CM | POA: Diagnosis not present

## 2013-05-28 DIAGNOSIS — R55 Syncope and collapse: Secondary | ICD-10-CM | POA: Diagnosis not present

## 2013-06-03 ENCOUNTER — Ambulatory Visit (HOSPITAL_COMMUNITY)
Admission: RE | Admit: 2013-06-03 | Discharge: 2013-06-03 | Disposition: A | Discharge status: Home / Self Care | Payer: Medicare Other | Source: Ambulatory Visit | Attending: Pulmonary Disease | Admitting: Pulmonary Disease

## 2013-06-03 DIAGNOSIS — IMO0001 Reserved for inherently not codable concepts without codable children: Secondary | ICD-10-CM | POA: Diagnosis not present

## 2013-06-03 DIAGNOSIS — R1310 Dysphagia, unspecified: Secondary | ICD-10-CM | POA: Diagnosis not present

## 2013-06-03 DIAGNOSIS — F209 Schizophrenia, unspecified: Secondary | ICD-10-CM | POA: Insufficient documentation

## 2013-06-03 DIAGNOSIS — E079 Disorder of thyroid, unspecified: Secondary | ICD-10-CM | POA: Insufficient documentation

## 2013-06-03 NOTE — Procedures (Signed)
Objective Swallowing Evaluation: Modified Barium Swallowing Study   Patient Details  Name: Steven David MRN: [id number] Date of Birth: May 29, 1955  Today's Date: 06/03/2013 Time: 10:30 AM  - 11:10 AM    Past Medical History:  Past Medical History  Diagnosis Date  . Schizophrenia   . Hernia   . Thyroid disease   . High cholesterol   . HOH (hard of hearing)    Past Surgical History:  Past Surgical History  Procedure Laterality Date  . Leg surgery    . Testicle surgery     HPI:  [name] was admitted to [facility] in early April with pneumonia and sepsis. He was placed on a dysphagia 3/mech soft diet with nectar-thick liquids. Most recent chest CT from 05/01/13 shows: "Review of the lung windows demonstrates nodular airspace disease in a segmental pattern within the left lower lobe consistent with pneumonia. No pleural fluid or pneumothorax." He lives at [facility] and has been consuming a mechanical soft diet and nectar-thick liquids since his discharge from hospital. MBSS ordered to see if pt can safely tolerate thin liquids.  Symptoms/Limitations Symptoms: Pt coughs with thin liquids Special Tests: MBSS  Recommendation/Prognosis  Clinical Impression:   Dysphagia Diagnosis: Mild oral phase dysphagia;Severe pharyngeal phase dysphagia Clinical impression: [name] was referred by Dr. Luan Pulling for [facility] which was completed this date. He presents with mild oral phase and severe pharyngeal phase dysphagia characterized by decreased bolus cohesiveness, premature spillage and delay in swallow initiation (delay was actually only mild), but significantly reduced hyolaryngeal excursion, tongue base retraction and epiglottic deflection resulting in severe to profound residue post swallow filling the pharynx to which pt was not aware. Pt was first given puree and over 50% of the bolus remained in pharynx after initial swallow- he was given 3 more bites which caused residuals to  build and he eventually aspirated without benefit of cough response. This continued to happen with nectars and honey-thick consistencies. Pt was cued to cough and repeat swallow, but not all of aspirated was cleared and pharynx remained filled. Pt's primary deficits appears to be weakness with essentially little pharyngeal pressure. Sensory based deficits as well given poor response to penetration/aspiration or even pharyngeal residuals. Study was terminated due to amount of aspiration and poor pt implementation of strategies. SLP spoke with caregiver, De [facility] who reports that pt seems to be eating just fine at his care/group home. I also spoke with his treating SLP, Shirlee Latch, who also states that pt appears to tolerate nectars and mech soft without change in lungs sounds- but does cough with thins. Given today's performance, pt is at high and significant risk for aspiration on all textures and consistencies presented. Pt was in the hospital about a month ago for PNA and I would suspect this will an ongoing problem. Pt states that he does not want a feeding tube, but this should be discussed with his primary physician. Pt has taken Haldol for several years per caregiver and this may have a negative impact on his swallow function, but may be necessary for other reasons. Will defer nutrition recommendations to MD after he discusses with pt. If pt/MD wish to pursue "safest diet" with known risk of aspiration, would continue diet as ordered (mech soft and NTL). Otherwise, alternative means of nutritions should be considered.   Swallow Evaluation Recommendations:  Diet Recommendations: NPO;Alternative means - long-term Oral Care Recommendations: Oral care BID Follow up Recommendations: Home health SLP  Prognosis:  Prognosis for Safe Diet Advancement: Guarded Barriers to Reach Goals: Severity of dysphagia;Cognitive deficits   Individuals Consulted: Consulted and Agree with Results and  Recommendations: Patient unable/family or caregiver not available (treating SLP) Report Sent to : Referring physician;Facility (Comment) [facility])    General: Date of Onset: 05/15/13 Type of Study: Modified Barium Swallowing Study Reason for Referral: Objectively evaluate swallowing function Previous Swallow Assessment: none on record Diet Prior to this Study: Dysphagia 3 (soft);Nectar-thick liquids Temperature Spikes Noted: No Respiratory Status: Room air History of Recent Intubation: No Behavior/Cognition: Alert;Cooperative;Requires cueing Oral Cavity - Dentition: Edentulous Oral Motor / Sensory Function: Impaired motor Oral impairment: Right labial Self-Feeding Abilities: Able to feed self Patient Positioning: Upright in chair Baseline Vocal Quality: Clear Volitional Cough: Weak Volitional Swallow: Able to elicit Anatomy: Within functional limits Pharyngeal Secretions: Not observed secondary MBS   Reason for Referral:   Objectively evaluate swallowing function    Oral Phase: Oral Preparation/Oral Phase Oral Phase: Impaired Oral - Pudding Oral - Pudding Teaspoon: Within functional limits Oral - Honey Oral - Honey Teaspoon: Reduced posterior propulsion;Lingual/palatal residue Oral - Nectar Oral - Nectar Cup: Reduced posterior propulsion;Lingual/palatal residue   Pharyngeal Phase:  Pharyngeal Phase Pharyngeal Phase: Impaired Pharyngeal - Pudding Pharyngeal - Pudding Teaspoon: Delayed swallow initiation;Premature spillage to valleculae;Reduced epiglottic inversion;Reduced anterior laryngeal mobility;Reduced laryngeal elevation;Reduced airway/laryngeal closure;Reduced tongue base retraction;Penetration/Aspiration during swallow;Penetration/Aspiration after swallow;Moderate aspiration;Pharyngeal residue - pyriform sinuses;Pharyngeal residue - valleculae;Lateral channel residue Penetration/Aspiration details (pudding teaspoon): Material enters airway,  passes BELOW cords without attempt by patient to eject out (silent aspiration);Material enters airway, passes BELOW cords and not ejected out despite cough attempt by patient Pharyngeal - Honey Pharyngeal - Honey Teaspoon: Delayed swallow initiation;Premature spillage to valleculae;Reduced epiglottic inversion;Reduced anterior laryngeal mobility;Reduced laryngeal elevation;Reduced airway/laryngeal closure;Reduced tongue base retraction;Penetration/Aspiration during swallow;Penetration/Aspiration after swallow;Moderate aspiration;Pharyngeal residue - pyriform sinuses;Pharyngeal residue - valleculae;Lateral channel residue Penetration/Aspiration details (honey teaspoon): Material enters airway, passes BELOW cords without attempt by patient to eject out (silent aspiration) Pharyngeal - Honey Cup: Not tested Pharyngeal - Nectar Pharyngeal - Nectar Teaspoon: Delayed swallow initiation;Premature spillage to valleculae;Reduced epiglottic inversion;Reduced anterior laryngeal mobility;Reduced laryngeal elevation;Reduced airway/laryngeal closure;Reduced tongue base retraction;Penetration/Aspiration during swallow;Penetration/Aspiration after swallow;Moderate aspiration;Pharyngeal residue - pyriform sinuses;Pharyngeal residue - valleculae;Lateral channel residue Penetration/Aspiration details (nectar teaspoon): Material enters airway, passes BELOW cords without attempt by patient to eject out (silent aspiration) Pharyngeal - Nectar Cup: Delayed swallow initiation;Premature spillage to valleculae;Reduced epiglottic inversion;Reduced anterior laryngeal mobility;Reduced laryngeal elevation;Reduced airway/laryngeal closure;Reduced tongue base retraction;Penetration/Aspiration during swallow;Penetration/Aspiration after swallow;Moderate aspiration;Pharyngeal residue - pyriform sinuses;Pharyngeal residue - valleculae;Lateral channel residue Penetration/Aspiration details (nectar cup): Material enters airway, passes BELOW  cords without attempt by patient to eject out (silent aspiration);Material enters airway, passes BELOW cords and not ejected out despite cough attempt by patient Pharyngeal - Thin Pharyngeal - Thin Cup: Not tested Pharyngeal - Solids Pharyngeal - Mechanical Soft: Not tested Pharyngeal Phase - Comment Pharyngeal Comment: Severely reduced pharyngeal pressure and contraction   Cervical Esophageal Phase  Cervical Esophageal Phase Cervical Esophageal Phase: [facility]   GN  Functional Assessment Tool Used: MBSS Functional Limitations: Swallowing Swallow Current Status (W7371): At least 80 percent but less than 100 percent impaired, limited or restricted Swallow Goal Status [phone]): At least 60 percent but less than 80 percent impaired, limited or restricted Swallow Discharge Status [phone]): At least 80 percent but less than 100 percent impaired, limited or restricted     Thank you,  Genene Churn, [place]   Alexis Goodell  Christain [place] 06/03/2013, 7:04 PM

## 2013-06-04 DIAGNOSIS — J189 Pneumonia, unspecified organism: Secondary | ICD-10-CM | POA: Diagnosis not present

## 2013-06-04 DIAGNOSIS — R1310 Dysphagia, unspecified: Secondary | ICD-10-CM | POA: Diagnosis not present

## 2013-06-04 DIAGNOSIS — R55 Syncope and collapse: Secondary | ICD-10-CM | POA: Diagnosis not present

## 2013-06-04 DIAGNOSIS — E43 Unspecified severe protein-calorie malnutrition: Secondary | ICD-10-CM | POA: Diagnosis not present

## 2013-06-04 DIAGNOSIS — D696 Thrombocytopenia, unspecified: Secondary | ICD-10-CM | POA: Diagnosis not present

## 2013-06-04 DIAGNOSIS — F209 Schizophrenia, unspecified: Secondary | ICD-10-CM | POA: Diagnosis not present

## 2013-06-16 DIAGNOSIS — D696 Thrombocytopenia, unspecified: Secondary | ICD-10-CM | POA: Diagnosis not present

## 2013-06-16 DIAGNOSIS — R1310 Dysphagia, unspecified: Secondary | ICD-10-CM | POA: Diagnosis not present

## 2013-06-16 DIAGNOSIS — R55 Syncope and collapse: Secondary | ICD-10-CM | POA: Diagnosis not present

## 2013-06-16 DIAGNOSIS — J189 Pneumonia, unspecified organism: Secondary | ICD-10-CM | POA: Diagnosis not present

## 2013-06-16 DIAGNOSIS — E43 Unspecified severe protein-calorie malnutrition: Secondary | ICD-10-CM | POA: Diagnosis not present

## 2013-06-16 DIAGNOSIS — F209 Schizophrenia, unspecified: Secondary | ICD-10-CM | POA: Diagnosis not present

## 2013-07-02 DIAGNOSIS — F209 Schizophrenia, unspecified: Secondary | ICD-10-CM | POA: Diagnosis not present

## 2013-08-19 DIAGNOSIS — I10 Essential (primary) hypertension: Secondary | ICD-10-CM | POA: Diagnosis not present

## 2013-08-19 DIAGNOSIS — F209 Schizophrenia, unspecified: Secondary | ICD-10-CM | POA: Diagnosis not present

## 2013-08-19 DIAGNOSIS — E785 Hyperlipidemia, unspecified: Secondary | ICD-10-CM | POA: Diagnosis not present

## 2013-08-19 DIAGNOSIS — E039 Hypothyroidism, unspecified: Secondary | ICD-10-CM | POA: Diagnosis not present

## 2013-09-23 DIAGNOSIS — E039 Hypothyroidism, unspecified: Secondary | ICD-10-CM | POA: Diagnosis not present

## 2013-09-23 DIAGNOSIS — J449 Chronic obstructive pulmonary disease, unspecified: Secondary | ICD-10-CM | POA: Diagnosis not present

## 2013-09-23 DIAGNOSIS — E785 Hyperlipidemia, unspecified: Secondary | ICD-10-CM | POA: Diagnosis not present

## 2013-09-23 DIAGNOSIS — I10 Essential (primary) hypertension: Secondary | ICD-10-CM | POA: Diagnosis not present

## 2013-10-16 ENCOUNTER — Ambulatory Visit (INDEPENDENT_AMBULATORY_CARE_PROVIDER_SITE_OTHER): Payer: Medicare Other | Admitting: Otolaryngology

## 2013-10-22 DIAGNOSIS — F209 Schizophrenia, unspecified: Secondary | ICD-10-CM | POA: Diagnosis not present

## 2013-10-27 DIAGNOSIS — Z79899 Other long term (current) drug therapy: Secondary | ICD-10-CM | POA: Diagnosis not present

## 2013-11-06 DIAGNOSIS — F209 Schizophrenia, unspecified: Secondary | ICD-10-CM | POA: Diagnosis not present

## 2013-11-06 DIAGNOSIS — I10 Essential (primary) hypertension: Secondary | ICD-10-CM | POA: Diagnosis not present

## 2013-11-06 DIAGNOSIS — E46 Unspecified protein-calorie malnutrition: Secondary | ICD-10-CM | POA: Diagnosis not present

## 2013-11-06 DIAGNOSIS — K4090 Unilateral inguinal hernia, without obstruction or gangrene, not specified as recurrent: Secondary | ICD-10-CM | POA: Diagnosis not present

## 2013-11-13 ENCOUNTER — Ambulatory Visit (INDEPENDENT_AMBULATORY_CARE_PROVIDER_SITE_OTHER): Payer: Medicare Other | Admitting: Otolaryngology

## 2013-11-13 DIAGNOSIS — H6123 Impacted cerumen, bilateral: Secondary | ICD-10-CM | POA: Diagnosis not present

## 2013-11-13 DIAGNOSIS — H906 Mixed conductive and sensorineural hearing loss, bilateral: Secondary | ICD-10-CM

## 2013-12-03 DIAGNOSIS — Z23 Encounter for immunization: Secondary | ICD-10-CM | POA: Diagnosis not present

## 2014-02-11 DIAGNOSIS — F209 Schizophrenia, unspecified: Secondary | ICD-10-CM | POA: Diagnosis not present

## 2014-03-09 DIAGNOSIS — K4090 Unilateral inguinal hernia, without obstruction or gangrene, not specified as recurrent: Secondary | ICD-10-CM | POA: Diagnosis not present

## 2014-03-09 DIAGNOSIS — I10 Essential (primary) hypertension: Secondary | ICD-10-CM | POA: Diagnosis not present

## 2014-03-09 DIAGNOSIS — J449 Chronic obstructive pulmonary disease, unspecified: Secondary | ICD-10-CM | POA: Diagnosis not present

## 2014-03-09 DIAGNOSIS — E46 Unspecified protein-calorie malnutrition: Secondary | ICD-10-CM | POA: Diagnosis not present

## 2014-06-03 DIAGNOSIS — F209 Schizophrenia, unspecified: Secondary | ICD-10-CM | POA: Diagnosis not present

## 2014-07-07 DIAGNOSIS — H6120 Impacted cerumen, unspecified ear: Secondary | ICD-10-CM | POA: Diagnosis not present

## 2014-07-07 DIAGNOSIS — E46 Unspecified protein-calorie malnutrition: Secondary | ICD-10-CM | POA: Diagnosis not present

## 2014-07-07 DIAGNOSIS — J449 Chronic obstructive pulmonary disease, unspecified: Secondary | ICD-10-CM | POA: Diagnosis not present

## 2014-07-07 DIAGNOSIS — K4090 Unilateral inguinal hernia, without obstruction or gangrene, not specified as recurrent: Secondary | ICD-10-CM | POA: Diagnosis not present

## 2014-07-07 DIAGNOSIS — I10 Essential (primary) hypertension: Secondary | ICD-10-CM | POA: Diagnosis not present

## 2014-08-31 ENCOUNTER — Emergency Department (HOSPITAL_COMMUNITY): Payer: Medicare Other

## 2014-08-31 ENCOUNTER — Inpatient Hospital Stay (HOSPITAL_COMMUNITY)
Admission: EM | Admit: 2014-08-31 | Discharge: 2014-09-10 | DRG: 853 | Disposition: A | Discharge status: Home Health | Payer: Medicare Other | Attending: General Surgery | Admitting: General Surgery

## 2014-08-31 ENCOUNTER — Encounter (HOSPITAL_COMMUNITY): Payer: Self-pay | Admitting: Emergency Medicine

## 2014-08-31 DIAGNOSIS — Z87891 Personal history of nicotine dependence: Secondary | ICD-10-CM

## 2014-08-31 DIAGNOSIS — Z4682 Encounter for fitting and adjustment of non-vascular catheter: Secondary | ICD-10-CM | POA: Diagnosis not present

## 2014-08-31 DIAGNOSIS — K469 Unspecified abdominal hernia without obstruction or gangrene: Secondary | ICD-10-CM | POA: Diagnosis present

## 2014-08-31 DIAGNOSIS — K4031 Unilateral inguinal hernia, with obstruction, without gangrene, recurrent: Secondary | ICD-10-CM | POA: Diagnosis present

## 2014-08-31 DIAGNOSIS — K460 Unspecified abdominal hernia with obstruction, without gangrene: Secondary | ICD-10-CM | POA: Diagnosis present

## 2014-08-31 DIAGNOSIS — I959 Hypotension, unspecified: Secondary | ICD-10-CM | POA: Diagnosis present

## 2014-08-31 DIAGNOSIS — IMO0001 Reserved for inherently not codable concepts without codable children: Secondary | ICD-10-CM

## 2014-08-31 DIAGNOSIS — N3289 Other specified disorders of bladder: Secondary | ICD-10-CM | POA: Diagnosis not present

## 2014-08-31 DIAGNOSIS — K4090 Unilateral inguinal hernia, without obstruction or gangrene, not specified as recurrent: Secondary | ICD-10-CM | POA: Diagnosis present

## 2014-08-31 DIAGNOSIS — Z79899 Other long term (current) drug therapy: Secondary | ICD-10-CM | POA: Diagnosis not present

## 2014-08-31 DIAGNOSIS — D62 Acute posthemorrhagic anemia: Secondary | ICD-10-CM | POA: Diagnosis present

## 2014-08-31 DIAGNOSIS — J9600 Acute respiratory failure, unspecified whether with hypoxia or hypercapnia: Secondary | ICD-10-CM | POA: Diagnosis not present

## 2014-08-31 DIAGNOSIS — A419 Sepsis, unspecified organism: Principal | ICD-10-CM | POA: Diagnosis present

## 2014-08-31 DIAGNOSIS — E785 Hyperlipidemia, unspecified: Secondary | ICD-10-CM | POA: Diagnosis present

## 2014-08-31 DIAGNOSIS — D696 Thrombocytopenia, unspecified: Secondary | ICD-10-CM | POA: Diagnosis present

## 2014-08-31 DIAGNOSIS — K559 Vascular disorder of intestine, unspecified: Secondary | ICD-10-CM

## 2014-08-31 DIAGNOSIS — N508 Other specified disorders of male genital organs: Secondary | ICD-10-CM | POA: Diagnosis not present

## 2014-08-31 DIAGNOSIS — K4030 Unilateral inguinal hernia, with obstruction, without gangrene, not specified as recurrent: Secondary | ICD-10-CM | POA: Diagnosis present

## 2014-08-31 DIAGNOSIS — Z0189 Encounter for other specified special examinations: Secondary | ICD-10-CM

## 2014-08-31 DIAGNOSIS — R4702 Dysphasia: Secondary | ICD-10-CM | POA: Diagnosis present

## 2014-08-31 DIAGNOSIS — E872 Acidosis, unspecified: Secondary | ICD-10-CM

## 2014-08-31 DIAGNOSIS — E43 Unspecified severe protein-calorie malnutrition: Secondary | ICD-10-CM | POA: Diagnosis present

## 2014-08-31 DIAGNOSIS — E039 Hypothyroidism, unspecified: Secondary | ICD-10-CM | POA: Diagnosis present

## 2014-08-31 DIAGNOSIS — R1310 Dysphagia, unspecified: Secondary | ICD-10-CM | POA: Diagnosis present

## 2014-08-31 DIAGNOSIS — K550 Acute vascular disorders of intestine: Secondary | ICD-10-CM | POA: Diagnosis present

## 2014-08-31 DIAGNOSIS — E870 Hyperosmolality and hypernatremia: Secondary | ICD-10-CM | POA: Diagnosis present

## 2014-08-31 DIAGNOSIS — R0989 Other specified symptoms and signs involving the circulatory and respiratory systems: Secondary | ICD-10-CM | POA: Diagnosis not present

## 2014-08-31 DIAGNOSIS — R109 Unspecified abdominal pain: Secondary | ICD-10-CM

## 2014-08-31 DIAGNOSIS — F209 Schizophrenia, unspecified: Secondary | ICD-10-CM | POA: Diagnosis present

## 2014-08-31 DIAGNOSIS — Z681 Body mass index (BMI) 19 or less, adult: Secondary | ICD-10-CM

## 2014-08-31 DIAGNOSIS — Z9289 Personal history of other medical treatment: Secondary | ICD-10-CM

## 2014-08-31 DIAGNOSIS — E780 Pure hypercholesterolemia: Secondary | ICD-10-CM | POA: Diagnosis present

## 2014-08-31 DIAGNOSIS — R569 Unspecified convulsions: Secondary | ICD-10-CM | POA: Insufficient documentation

## 2014-08-31 DIAGNOSIS — H9190 Unspecified hearing loss, unspecified ear: Secondary | ICD-10-CM | POA: Diagnosis present

## 2014-08-31 DIAGNOSIS — Z4659 Encounter for fitting and adjustment of other gastrointestinal appliance and device: Secondary | ICD-10-CM

## 2014-08-31 DIAGNOSIS — J811 Chronic pulmonary edema: Secondary | ICD-10-CM | POA: Diagnosis not present

## 2014-08-31 DIAGNOSIS — F203 Undifferentiated schizophrenia: Secondary | ICD-10-CM | POA: Diagnosis not present

## 2014-08-31 DIAGNOSIS — N179 Acute kidney failure, unspecified: Secondary | ICD-10-CM | POA: Diagnosis not present

## 2014-08-31 DIAGNOSIS — J95821 Acute postprocedural respiratory failure: Secondary | ICD-10-CM | POA: Diagnosis not present

## 2014-08-31 DIAGNOSIS — Z48813 Encounter for surgical aftercare following surgery on the respiratory system: Secondary | ICD-10-CM | POA: Diagnosis not present

## 2014-08-31 DIAGNOSIS — E876 Hypokalemia: Secondary | ICD-10-CM | POA: Diagnosis not present

## 2014-08-31 DIAGNOSIS — R6521 Severe sepsis with septic shock: Secondary | ICD-10-CM | POA: Diagnosis not present

## 2014-08-31 DIAGNOSIS — F2089 Other schizophrenia: Secondary | ICD-10-CM | POA: Diagnosis not present

## 2014-08-31 DIAGNOSIS — R112 Nausea with vomiting, unspecified: Secondary | ICD-10-CM | POA: Diagnosis not present

## 2014-08-31 DIAGNOSIS — I639 Cerebral infarction, unspecified: Secondary | ICD-10-CM | POA: Diagnosis not present

## 2014-08-31 DIAGNOSIS — Z8673 Personal history of transient ischemic attack (TIA), and cerebral infarction without residual deficits: Secondary | ICD-10-CM

## 2014-08-31 DIAGNOSIS — R6511 Systemic inflammatory response syndrome (SIRS) of non-infectious origin with acute organ dysfunction: Secondary | ICD-10-CM | POA: Diagnosis not present

## 2014-08-31 DIAGNOSIS — I9581 Postprocedural hypotension: Secondary | ICD-10-CM | POA: Diagnosis not present

## 2014-08-31 DIAGNOSIS — E079 Disorder of thyroid, unspecified: Secondary | ICD-10-CM | POA: Diagnosis present

## 2014-08-31 DIAGNOSIS — N289 Disorder of kidney and ureter, unspecified: Secondary | ICD-10-CM | POA: Diagnosis present

## 2014-08-31 DIAGNOSIS — Z452 Encounter for adjustment and management of vascular access device: Secondary | ICD-10-CM | POA: Diagnosis not present

## 2014-08-31 DIAGNOSIS — R069 Unspecified abnormalities of breathing: Secondary | ICD-10-CM

## 2014-08-31 DIAGNOSIS — J00 Acute nasopharyngitis [common cold]: Secondary | ICD-10-CM | POA: Diagnosis not present

## 2014-08-31 DIAGNOSIS — N1330 Unspecified hydronephrosis: Secondary | ICD-10-CM | POA: Diagnosis not present

## 2014-08-31 DIAGNOSIS — Z978 Presence of other specified devices: Secondary | ICD-10-CM

## 2014-08-31 LAB — CBC WITH DIFFERENTIAL/PLATELET
Basophils Absolute: 0 10*3/uL (ref 0.0–0.1)
Basophils Relative: 0 % (ref 0–1)
Eosinophils Absolute: 0 10*3/uL (ref 0.0–0.7)
Eosinophils Relative: 0 % (ref 0–5)
HCT: 40.7 % (ref 39.0–52.0)
Hemoglobin: 13.4 g/dL (ref 13.0–17.0)
Lymphocytes Relative: 5 % — ABNORMAL LOW (ref 12–46)
Lymphs Abs: 0.9 10*3/uL (ref 0.7–4.0)
MCH: 29.8 pg (ref 26.0–34.0)
MCHC: 32.9 g/dL (ref 30.0–36.0)
MCV: 90.6 fL (ref 78.0–100.0)
Monocytes Absolute: 1.7 10*3/uL — ABNORMAL HIGH (ref 0.1–1.0)
Monocytes Relative: 10 % (ref 3–12)
Neutro Abs: 14.8 10*3/uL — ABNORMAL HIGH (ref 1.7–7.7)
Neutrophils Relative %: 85 % — ABNORMAL HIGH (ref 43–77)
Platelets: 104 10*3/uL — ABNORMAL LOW (ref 150–400)
RBC: 4.49 MIL/uL (ref 4.22–5.81)
RDW: 14.5 % (ref 11.5–15.5)
Smear Review: DECREASED
WBC: 17.4 10*3/uL — ABNORMAL HIGH (ref 4.0–10.5)

## 2014-08-31 LAB — BASIC METABOLIC PANEL
Anion gap: 12 (ref 5–15)
BUN: 39 mg/dL — ABNORMAL HIGH (ref 6–20)
CO2: 19 mmol/L — ABNORMAL LOW (ref 22–32)
Calcium: 8.8 mg/dL — ABNORMAL LOW (ref 8.9–10.3)
Chloride: 104 mmol/L (ref 101–111)
Creatinine, Ser: 2.01 mg/dL — ABNORMAL HIGH (ref 0.61–1.24)
GFR calc Af Amer: 41 mL/min — ABNORMAL LOW (ref >60)
GFR calc non Af Amer: 35 mL/min — ABNORMAL LOW (ref >60)
Glucose, Bld: 123 mg/dL — ABNORMAL HIGH (ref 65–99)
Potassium: 5.4 mmol/L — ABNORMAL HIGH (ref 3.5–5.1)
Sodium: 135 mmol/L (ref 135–145)

## 2014-08-31 LAB — URINE MICROSCOPIC-ADD ON

## 2014-08-31 LAB — URINALYSIS, ROUTINE W REFLEX MICROSCOPIC
Bilirubin Urine: NEGATIVE
Glucose, UA: NEGATIVE mg/dL
Leukocytes, UA: NEGATIVE
Nitrite: NEGATIVE
Protein, ur: NEGATIVE mg/dL
Specific Gravity, Urine: 1.02 (ref 1.005–1.030)
Urobilinogen, UA: 0.2 mg/dL (ref 0.0–1.0)
pH: 5.5 (ref 5.0–8.0)

## 2014-08-31 MED ORDER — SODIUM CHLORIDE 0.9 % IV BOLUS (SEPSIS)
1000.0000 mL | Freq: Once | INTRAVENOUS | Status: AC
  Administered 2014-08-31: 1000 mL via INTRAVENOUS

## 2014-08-31 MED ORDER — ONDANSETRON HCL 4 MG/2ML IJ SOLN
4.0000 mg | Freq: Once | INTRAMUSCULAR | Status: DC
  Filled 2014-08-31: qty 2

## 2014-08-31 MED ORDER — MORPHINE SULFATE (PF) 4 MG/ML IV SOLN
4.0000 mg | Freq: Once | INTRAVENOUS | Status: DC
  Filled 2014-08-31: qty 1

## 2014-08-31 MED ORDER — IOHEXOL 300 MG/ML  SOLN
50.0000 mL | Freq: Once | INTRAMUSCULAR | Status: AC | PRN
  Administered 2014-08-31: 50 mL via ORAL

## 2014-08-31 MED ORDER — SODIUM CHLORIDE 0.9 % IV SOLN
INTRAVENOUS | Status: DC
  Administered 2014-08-31 – 2014-09-01 (×5): via INTRAVENOUS

## 2014-08-31 MED ORDER — ONDANSETRON HCL 4 MG/2ML IJ SOLN
4.0000 mg | Freq: Once | INTRAMUSCULAR | Status: AC
  Administered 2014-08-31: 4 mg via INTRAVENOUS

## 2014-08-31 MED ORDER — IOHEXOL 300 MG/ML  SOLN: 100.0000 mL | Freq: Once | INTRAMUSCULAR | Status: DC | PRN

## 2014-08-31 NOTE — ED Notes (Signed)
Pt from group home and sent over for n/v. Pt has a very strong urine odor.

## 2014-08-31 NOTE — ED Provider Notes (Signed)
CSN: [id number]     Arrival date & time 08/31/14  2120 History  This chart was scribed for Virgel Manifold, MD by Irene Pap, ED Scribe. This patient was seen in room APA19/APA19 and patient care was started at 10:22 PM.   Chief Complaint  Patient presents with  . Emesis   The history is provided by the patient. No language interpreter was used.  HPI Comments (Level 5 Caveat due to mental disability): Steven David is a 59 y.o. male with hx of thyroid disease and hernia who presents to the Emergency Department complaining of emesis. Per triage note, pt is a resident of [facility] and has been complaining of associated nausea and lower abdominal and genital pain. States that caregiver reports that genital swelling is normal per pt.   Past Medical History  Diagnosis Date  . Schizophrenia   . Hernia   . Thyroid disease   . High cholesterol   . HOH (hard of hearing)    Past Surgical History  Procedure Laterality Date  . Leg surgery    . Testicle surgery     No family history on file. Social History  Substance Use Topics  . Smoking status: Former Research scientist (life sciences)  . Smokeless tobacco: None  . Alcohol Use: No    Review of Systems  Unable to perform ROS: Other    Allergies  Review of patient's allergies indicates no known allergies.  Home Medications   Prior to Admission medications   Medication Sig Start Date End Date Taking? Authorizing Provider  amoxicillin-clavulanate (AUGMENTIN) 875-125 MG per tablet Take 1 tablet by mouth every 12 (twelve) hours. 04/16/13   Sinda Du, MD  docusate sodium (COLACE) 100 MG capsule Take 100 mg by mouth at bedtime as needed for mild constipation.    Historical Provider, MD  feeding supplement, ENSURE, (ENSURE) PUDG Take 1 Container by mouth 3 (three) times daily between meals. 04/16/13   Sinda Du, MD  fish oil-omega-3 fatty acids 1000 MG capsule Take 1 g by mouth 3 (three) times daily.    Historical Provider, MD  guaifenesin  (ROBITUSSIN) 100 MG/5ML syrup Take 200 mg by mouth every 4 (four) hours as needed for cough.    Historical Provider, MD  haloperidol (HALDOL) 10 MG tablet Take 10 mg by mouth every evening.    Historical Provider, MD  haloperidol decanoate (HALDOL DECANOATE) 100 MG/ML injection Inject 100 mg into the muscle every 28 (twenty-eight) days. Administered by [facility]    Historical Provider, MD  levothyroxine (SYNTHROID, LEVOTHROID) 100 MCG tablet Take 100 mcg by mouth daily before breakfast.    Historical Provider, MD  lithium carbonate 300 MG capsule Take 300 mg by mouth 2 (two) times daily.    Historical Provider, MD  loratadine (CLARITIN) 10 MG tablet Take 10 mg by mouth daily.    Historical Provider, MD  lovastatin (MEVACOR) 20 MG tablet Take 20 mg by mouth daily.    Historical Provider, MD  Maltodextrin-Xanthan Gum ([place]) POWD Take 1 Container by mouth as needed (Used to make liquids nectar thick). 04/16/13   Sinda Du, MD  nicotine (NICODERM CQ - DOSED IN MG/24 HOURS) 21 mg/24hr patch Place 1 patch onto the skin daily.    Historical Provider, MD  oxybutynin (DITROPAN-XL) 10 MG 24 hr tablet Take 10 mg by mouth daily.    Historical Provider, MD  oxybutynin (DITROPAN-XL) 10 MG 24 hr tablet Take 10 mg by mouth daily.    Historical Provider, MD  polyethylene glycol powder (GLYCOLAX/MIRALAX) powder Take 17 g by mouth daily. Dissolved in 8 ounces of water/juice daily    Historical Provider, MD   BP 95/52 mmHg  Pulse 90  Temp(Src) 97.8 F (36.6 C) (Oral)  Resp 16  SpO2 97%  Physical Exam  Constitutional: He is oriented to person, place, and time.  HENT:  Head: Normocephalic and atraumatic.  Neck: Normal range of motion. Neck supple.  Cardiovascular: Normal rate.   Pulmonary/Chest: Effort normal.  Abdominal: Soft.  Genitourinary:  Scrotum is very swollen; suspect very large inguinal hernia. Hard to tell because anatomy is so distorted but probably originating from L side;  the penis is buried within the scrotum but able to retract to visualized glans/urethra. Does not seem tender. No redness or other concerning skin changes.   Musculoskeletal: Normal range of motion.  Neurological: He is alert and oriented to person, place, and time.  Skin: Skin is warm and dry.  Psychiatric: He has a normal mood and affect. His behavior is normal.  Nursing note and vitals reviewed.   ED Course  Procedures (including critical care time) DIAGNOSTIC STUDIES: Oxygen Saturation is 97% on RA, normal by my interpretation.    COORDINATION OF CARE: 10:27 PM- CT scan  Labs Review Labs Reviewed  URINALYSIS, ROUTINE W REFLEX MICROSCOPIC (NOT AT [facility]) - Abnormal; Notable for the following:    Hgb urine dipstick TRACE (*)    Ketones, ur TRACE (*)    All other components within normal limits  BASIC METABOLIC PANEL - Abnormal; Notable for the following:    Potassium 5.4 (*)    CO2 19 (*)    Glucose, Bld 123 (*)    BUN 39 (*)    Creatinine, Ser 2.01 (*)    Calcium 8.8 (*)    GFR calc non Af Amer 35 (*)    GFR calc Af Amer 41 (*)    All other components within normal limits  URINE MICROSCOPIC-ADD ON - Abnormal; Notable for the following:    Squamous Epithelial / LPF FEW (*)    Bacteria, UA FEW (*)    All other components within normal limits  CBC WITH DIFFERENTIAL/PLATELET    Imaging Review No results found.    EKG Interpretation None      MDM   Final diagnoses:  Non-intractable vomiting with nausea, vomiting of unspecified type  AKI (acute kidney injury)    59yM with what I suspect is very large L inguinal hernia. Hard to get good history from patient. Apparently this swelling is chronic at least to some degree. With obstructive symptoms, will CT. Abdomen is soft and seems nontender.   What appears to be AKI. Renal function a year ago was normal. IVF. CT pending.   PCP: Luan Pulling   I personally preformed the services scribed in my presence. The recorded  information has been reviewed is accurate. Virgel Manifold, MD.    Virgel Manifold, MD 08/31/14 [phone]

## 2014-08-31 NOTE — ED Notes (Signed)
Patient placed on bedpan.

## 2014-08-31 NOTE — ED Notes (Signed)
Pt refusing to drink any more contrast, a total of one bottle ingested; CT informed

## 2014-08-31 NOTE — ED Notes (Signed)
Spoke with Lenore patient's care giver at [facility]. States patient has been nauseas with vomiting and pain in lower abdominal/genital area.  Care giver states that genital swelling is normal for patient.  Patient arrived with strong smell of dried urine on clothes.

## 2014-09-01 ENCOUNTER — Emergency Department (HOSPITAL_COMMUNITY): Payer: Medicare Other | Admitting: Certified Registered Nurse Anesthetist

## 2014-09-01 ENCOUNTER — Inpatient Hospital Stay (HOSPITAL_COMMUNITY): Payer: Medicare Other

## 2014-09-01 ENCOUNTER — Encounter (HOSPITAL_COMMUNITY): Admission: EM | Disposition: A | Discharge status: Home Health | Payer: Self-pay | Source: Home / Self Care

## 2014-09-01 DIAGNOSIS — N1330 Unspecified hydronephrosis: Secondary | ICD-10-CM | POA: Diagnosis not present

## 2014-09-01 DIAGNOSIS — R6521 Severe sepsis with septic shock: Secondary | ICD-10-CM

## 2014-09-01 DIAGNOSIS — R0989 Other specified symptoms and signs involving the circulatory and respiratory systems: Secondary | ICD-10-CM | POA: Diagnosis not present

## 2014-09-01 DIAGNOSIS — K469 Unspecified abdominal hernia without obstruction or gangrene: Secondary | ICD-10-CM | POA: Diagnosis present

## 2014-09-01 DIAGNOSIS — A419 Sepsis, unspecified organism: Secondary | ICD-10-CM

## 2014-09-01 DIAGNOSIS — N179 Acute kidney failure, unspecified: Secondary | ICD-10-CM | POA: Diagnosis not present

## 2014-09-01 DIAGNOSIS — Z87891 Personal history of nicotine dependence: Secondary | ICD-10-CM | POA: Diagnosis not present

## 2014-09-01 DIAGNOSIS — E872 Acidosis, unspecified: Secondary | ICD-10-CM

## 2014-09-01 DIAGNOSIS — F209 Schizophrenia, unspecified: Secondary | ICD-10-CM | POA: Diagnosis not present

## 2014-09-01 DIAGNOSIS — E780 Pure hypercholesterolemia: Secondary | ICD-10-CM | POA: Diagnosis present

## 2014-09-01 DIAGNOSIS — E079 Disorder of thyroid, unspecified: Secondary | ICD-10-CM | POA: Diagnosis present

## 2014-09-01 DIAGNOSIS — R6511 Systemic inflammatory response syndrome (SIRS) of non-infectious origin with acute organ dysfunction: Secondary | ICD-10-CM | POA: Diagnosis not present

## 2014-09-01 DIAGNOSIS — K550 Acute vascular disorders of intestine: Secondary | ICD-10-CM | POA: Diagnosis not present

## 2014-09-01 DIAGNOSIS — K460 Unspecified abdominal hernia with obstruction, without gangrene: Secondary | ICD-10-CM | POA: Diagnosis present

## 2014-09-01 DIAGNOSIS — E039 Hypothyroidism, unspecified: Secondary | ICD-10-CM | POA: Diagnosis not present

## 2014-09-01 DIAGNOSIS — H9190 Unspecified hearing loss, unspecified ear: Secondary | ICD-10-CM | POA: Diagnosis present

## 2014-09-01 DIAGNOSIS — K4090 Unilateral inguinal hernia, without obstruction or gangrene, not specified as recurrent: Secondary | ICD-10-CM | POA: Diagnosis present

## 2014-09-01 DIAGNOSIS — J9600 Acute respiratory failure, unspecified whether with hypoxia or hypercapnia: Secondary | ICD-10-CM | POA: Diagnosis not present

## 2014-09-01 DIAGNOSIS — Z681 Body mass index (BMI) 19 or less, adult: Secondary | ICD-10-CM | POA: Diagnosis not present

## 2014-09-01 DIAGNOSIS — E870 Hyperosmolality and hypernatremia: Secondary | ICD-10-CM | POA: Diagnosis not present

## 2014-09-01 DIAGNOSIS — J00 Acute nasopharyngitis [common cold]: Secondary | ICD-10-CM

## 2014-09-01 DIAGNOSIS — J811 Chronic pulmonary edema: Secondary | ICD-10-CM | POA: Diagnosis not present

## 2014-09-01 DIAGNOSIS — R1310 Dysphagia, unspecified: Secondary | ICD-10-CM | POA: Diagnosis not present

## 2014-09-01 DIAGNOSIS — F2089 Other schizophrenia: Secondary | ICD-10-CM

## 2014-09-01 DIAGNOSIS — N289 Disorder of kidney and ureter, unspecified: Secondary | ICD-10-CM | POA: Diagnosis present

## 2014-09-01 DIAGNOSIS — I9581 Postprocedural hypotension: Secondary | ICD-10-CM | POA: Diagnosis not present

## 2014-09-01 DIAGNOSIS — K559 Vascular disorder of intestine, unspecified: Secondary | ICD-10-CM | POA: Diagnosis present

## 2014-09-01 DIAGNOSIS — F203 Undifferentiated schizophrenia: Secondary | ICD-10-CM | POA: Diagnosis not present

## 2014-09-01 DIAGNOSIS — K4031 Unilateral inguinal hernia, with obstruction, without gangrene, recurrent: Secondary | ICD-10-CM | POA: Diagnosis present

## 2014-09-01 DIAGNOSIS — I959 Hypotension, unspecified: Secondary | ICD-10-CM | POA: Diagnosis present

## 2014-09-01 DIAGNOSIS — Z79899 Other long term (current) drug therapy: Secondary | ICD-10-CM | POA: Diagnosis not present

## 2014-09-01 DIAGNOSIS — N508 Other specified disorders of male genital organs: Secondary | ICD-10-CM | POA: Diagnosis not present

## 2014-09-01 DIAGNOSIS — I639 Cerebral infarction, unspecified: Secondary | ICD-10-CM | POA: Diagnosis not present

## 2014-09-01 DIAGNOSIS — N3289 Other specified disorders of bladder: Secondary | ICD-10-CM | POA: Diagnosis not present

## 2014-09-01 DIAGNOSIS — Z48813 Encounter for surgical aftercare following surgery on the respiratory system: Secondary | ICD-10-CM | POA: Diagnosis not present

## 2014-09-01 DIAGNOSIS — R569 Unspecified convulsions: Secondary | ICD-10-CM | POA: Diagnosis not present

## 2014-09-01 DIAGNOSIS — E43 Unspecified severe protein-calorie malnutrition: Secondary | ICD-10-CM | POA: Diagnosis not present

## 2014-09-01 DIAGNOSIS — R4702 Dysphasia: Secondary | ICD-10-CM | POA: Diagnosis not present

## 2014-09-01 DIAGNOSIS — D696 Thrombocytopenia, unspecified: Secondary | ICD-10-CM | POA: Diagnosis not present

## 2014-09-01 DIAGNOSIS — Z452 Encounter for adjustment and management of vascular access device: Secondary | ICD-10-CM | POA: Diagnosis not present

## 2014-09-01 DIAGNOSIS — K4030 Unilateral inguinal hernia, with obstruction, without gangrene, not specified as recurrent: Secondary | ICD-10-CM | POA: Diagnosis present

## 2014-09-01 DIAGNOSIS — J95821 Acute postprocedural respiratory failure: Secondary | ICD-10-CM | POA: Diagnosis not present

## 2014-09-01 DIAGNOSIS — D62 Acute posthemorrhagic anemia: Secondary | ICD-10-CM | POA: Diagnosis present

## 2014-09-01 DIAGNOSIS — E876 Hypokalemia: Secondary | ICD-10-CM | POA: Diagnosis not present

## 2014-09-01 DIAGNOSIS — Z4682 Encounter for fitting and adjustment of non-vascular catheter: Secondary | ICD-10-CM | POA: Diagnosis not present

## 2014-09-01 DIAGNOSIS — E785 Hyperlipidemia, unspecified: Secondary | ICD-10-CM | POA: Diagnosis not present

## 2014-09-01 HISTORY — PX: LAPAROTOMY: SHX154

## 2014-09-01 LAB — CBC WITH DIFFERENTIAL/PLATELET
BLASTS: 0 %
Band Neutrophils: 58 % — ABNORMAL HIGH (ref 0–10)
Basophils Absolute: 0 10*3/uL (ref 0.0–0.1)
Basophils Relative: 0 % (ref 0–1)
EOS ABS: 0 10*3/uL (ref 0.0–0.7)
Eosinophils Relative: 0 % (ref 0–5)
HCT: 31 % — ABNORMAL LOW (ref 39.0–52.0)
HEMOGLOBIN: 10.3 g/dL — AB (ref 13.0–17.0)
LYMPHS ABS: 1 10*3/uL (ref 0.7–4.0)
LYMPHS PCT: 9 % — AB (ref 12–46)
MCH: 28.9 pg (ref 26.0–34.0)
MCHC: 33.2 g/dL (ref 30.0–36.0)
MCV: 87.1 fL (ref 78.0–100.0)
MONO ABS: 0.6 10*3/uL (ref 0.1–1.0)
MONOS PCT: 5 % (ref 3–12)
Metamyelocytes Relative: 17 %
Myelocytes: 3 %
Neutro Abs: 9.7 10*3/uL — ABNORMAL HIGH (ref 1.7–7.7)
Neutrophils Relative %: 7 % — ABNORMAL LOW (ref 43–77)
OTHER: 1 %
PLATELETS: 93 10*3/uL — AB (ref 150–400)
Promyelocytes Absolute: 0 %
RBC: 3.56 MIL/uL — AB (ref 4.22–5.81)
RDW: 14.7 % (ref 11.5–15.5)
WBC MORPHOLOGY: INCREASED
WBC: 11.4 10*3/uL — AB (ref 4.0–10.5)
nRBC: 0 /100 WBC

## 2014-09-01 LAB — BASIC METABOLIC PANEL
Anion gap: 5 (ref 5–15)
Anion gap: 7 (ref 5–15)
BUN: 36 mg/dL — AB (ref 6–20)
BUN: 30 mg/dL — AB (ref 6–20)
CHLORIDE: 117 mmol/L — AB (ref 101–111)
CO2: 19 mmol/L — ABNORMAL LOW (ref 22–32)
CO2: 17 mmol/L — ABNORMAL LOW (ref 22–32)
CREATININE: 1.7 mg/dL — AB (ref 0.61–1.24)
CREATININE: 1.38 mg/dL — AB (ref 0.61–1.24)
Calcium: 7 mg/dL — ABNORMAL LOW (ref 8.9–10.3)
Calcium: 7.1 mg/dL — ABNORMAL LOW (ref 8.9–10.3)
Chloride: 115 mmol/L — ABNORMAL HIGH (ref 101–111)
GFR, EST AFRICAN AMERICAN: 50 mL/min — AB (ref >60)
GFR, EST NON AFRICAN AMERICAN: 43 mL/min — AB (ref >60)
GFR, EST NON AFRICAN AMERICAN: 55 mL/min — AB (ref >60)
Glucose, Bld: 102 mg/dL — ABNORMAL HIGH (ref 65–99)
Glucose, Bld: 119 mg/dL — ABNORMAL HIGH (ref 65–99)
POTASSIUM: 3.8 mmol/L (ref 3.5–5.1)
Potassium: 4.2 mmol/L (ref 3.5–5.1)
SODIUM: 139 mmol/L (ref 135–145)
SODIUM: 141 mmol/L (ref 135–145)

## 2014-09-01 LAB — POCT I-STAT 7, (LYTES, BLD GAS, ICA,H+H)
ACID-BASE DEFICIT: 8 mmol/L — AB (ref 0.0–2.0)
Bicarbonate: 17.9 mEq/L — ABNORMAL LOW (ref 20.0–24.0)
CALCIUM ION: 1.07 mmol/L — AB (ref 1.12–1.23)
HEMATOCRIT: 30 % — AB (ref 39.0–52.0)
Hemoglobin: 10.2 g/dL — ABNORMAL LOW (ref 13.0–17.0)
O2 SAT: 100 %
PH ART: 7.311 — AB (ref 7.350–7.450)
POTASSIUM: 3.8 mmol/L (ref 3.5–5.1)
SODIUM: 139 mmol/L (ref 135–145)
TCO2: 19 mmol/L (ref 0–100)
pCO2 arterial: 35.4 mmHg (ref 35.0–45.0)
pO2, Arterial: 325 mmHg — ABNORMAL HIGH (ref 80.0–100.0)

## 2014-09-01 LAB — CBC
HCT: 29.7 % — ABNORMAL LOW (ref 39.0–52.0)
HEMOGLOBIN: 9.8 g/dL — AB (ref 13.0–17.0)
MCH: 28.9 pg (ref 26.0–34.0)
MCHC: 33 g/dL (ref 30.0–36.0)
MCV: 87.6 fL (ref 78.0–100.0)
PLATELETS: 80 10*3/uL — AB (ref 150–400)
RBC: 3.39 MIL/uL — AB (ref 4.22–5.81)
RDW: 14.6 % (ref 11.5–15.5)
WBC: 10.1 10*3/uL (ref 4.0–10.5)

## 2014-09-01 LAB — GLUCOSE, CAPILLARY
GLUCOSE-CAPILLARY: 87 mg/dL (ref 65–99)
GLUCOSE-CAPILLARY: 103 mg/dL — AB (ref 65–99)
Glucose-Capillary: 60 mg/dL — ABNORMAL LOW (ref 65–99)
Glucose-Capillary: 91 mg/dL (ref 65–99)
Glucose-Capillary: 105 mg/dL — ABNORMAL HIGH (ref 65–99)

## 2014-09-01 LAB — MAGNESIUM: MAGNESIUM: 1.4 mg/dL — AB (ref 1.7–2.4)

## 2014-09-01 LAB — CARBOXYHEMOGLOBIN
CARBOXYHEMOGLOBIN: 1 % (ref 0.5–1.5)
Methemoglobin: 1.5 % (ref 0.0–1.5)
O2 SAT: 92.3 %
Total hemoglobin: 10.4 g/dL — ABNORMAL LOW (ref 13.5–18.0)

## 2014-09-01 LAB — LACTIC ACID, PLASMA
LACTIC ACID, VENOUS: 1.1 mmol/L (ref 0.5–2.0)
Lactic Acid, Venous: 4.7 mmol/L (ref 0.5–2.0)

## 2014-09-01 LAB — TRIGLYCERIDES: Triglycerides: 40 mg/dL (ref <150)

## 2014-09-01 LAB — LITHIUM LEVEL: Lithium Lvl: 0.56 mmol/L — ABNORMAL LOW (ref 0.60–1.20)

## 2014-09-01 LAB — PHOSPHORUS: PHOSPHORUS: 2.9 mg/dL (ref 2.5–4.6)

## 2014-09-01 LAB — TROPONIN I: TROPONIN I: 0.06 ng/mL — AB (ref <0.031)

## 2014-09-01 LAB — PROCALCITONIN: PROCALCITONIN: 33.48 ng/mL

## 2014-09-01 SURGERY — LAPAROTOMY, EXPLORATORY
Anesthesia: General | Site: Abdomen

## 2014-09-01 MED ORDER — CALCIUM CHLORIDE 10 % IV SOLN
INTRAVENOUS | Status: AC
  Filled 2014-09-01: qty 10

## 2014-09-01 MED ORDER — ONDANSETRON 4 MG PO TBDP
4.0000 mg | ORAL_TABLET | Freq: Four times a day (QID) | ORAL | Status: DC | PRN
  Filled 2014-09-01: qty 1

## 2014-09-01 MED ORDER — HYDROMORPHONE HCL 1 MG/ML IJ SOLN: 0.2500 mg | INTRAMUSCULAR | Status: DC | PRN

## 2014-09-01 MED ORDER — PROPOFOL 10 MG/ML IV BOLUS
INTRAVENOUS | Status: AC
  Filled 2014-09-01: qty 20

## 2014-09-01 MED ORDER — DEXMEDETOMIDINE HCL IN NACL 200 MCG/50ML IV SOLN
0.4000 ug/kg/h | INTRAVENOUS | Status: DC
  Administered 2014-09-01: 0.4 ug/kg/h via INTRAVENOUS
  Filled 2014-09-01: qty 50

## 2014-09-01 MED ORDER — FENTANYL CITRATE (PF) 100 MCG/2ML IJ SOLN
50.0000 ug | INTRAMUSCULAR | Status: DC | PRN
  Administered 2014-09-01 (×2): 100 ug via INTRAVENOUS
  Administered 2014-09-01: 50 ug via INTRAVENOUS
  Administered 2014-09-02 (×2): 100 ug via INTRAVENOUS
  Administered 2014-09-03 – 2014-09-04 (×2): 50 ug via INTRAVENOUS
  Administered 2014-09-04: 100 ug via INTRAVENOUS
  Administered 2014-09-06: 50 ug via INTRAVENOUS
  Filled 2014-09-01 (×9): qty 2

## 2014-09-01 MED ORDER — LIDOCAINE HCL (CARDIAC) 20 MG/ML IV SOLN
INTRAVENOUS | Status: AC
  Filled 2014-09-01: qty 5

## 2014-09-01 MED ORDER — PROPOFOL 10 MG/ML IV BOLUS
INTRAVENOUS | Status: DC | PRN
  Administered 2014-09-01: 80 mg via INTRAVENOUS

## 2014-09-01 MED ORDER — ROCURONIUM BROMIDE 50 MG/5ML IV SOLN
INTRAVENOUS | Status: AC
  Filled 2014-09-01: qty 1

## 2014-09-01 MED ORDER — DEXTROSE 5 % IV SOLN
2.0000 g | Freq: Two times a day (BID) | INTRAVENOUS | Status: AC
  Administered 2014-09-01 – 2014-09-02 (×2): 2 g via INTRAVENOUS
  Filled 2014-09-01 (×2): qty 2

## 2014-09-01 MED ORDER — ACETAMINOPHEN 650 MG RE SUPP: 650.0000 mg | Freq: Four times a day (QID) | RECTAL | Status: DC | PRN

## 2014-09-01 MED ORDER — MIDAZOLAM HCL 2 MG/2ML IJ SOLN: 0.5000 mg | Freq: Once | INTRAMUSCULAR | Status: DC | PRN

## 2014-09-01 MED ORDER — LEVOTHYROXINE SODIUM 100 MCG IV SOLR
50.0000 ug | Freq: Every day | INTRAVENOUS | Status: DC
  Administered 2014-09-01 – 2014-09-02 (×2): 50 ug via INTRAVENOUS
  Filled 2014-09-01 (×3): qty 5

## 2014-09-01 MED ORDER — PANTOPRAZOLE SODIUM 40 MG IV SOLR
40.0000 mg | INTRAVENOUS | Status: DC
  Administered 2014-09-01 – 2014-09-09 (×9): 40 mg via INTRAVENOUS
  Filled 2014-09-01 (×12): qty 40

## 2014-09-01 MED ORDER — FENTANYL CITRATE (PF) 100 MCG/2ML IJ SOLN
INTRAMUSCULAR | Status: DC | PRN
  Administered 2014-09-01: 150 ug via INTRAVENOUS
  Administered 2014-09-01: 100 ug via INTRAVENOUS

## 2014-09-01 MED ORDER — PANTOPRAZOLE SODIUM 40 MG IV SOLR: 40.0000 mg | Freq: Every day | INTRAVENOUS | Status: DC

## 2014-09-01 MED ORDER — CHLORHEXIDINE GLUCONATE 0.12% ORAL RINSE (MEDLINE KIT)
15.0000 mL | Freq: Two times a day (BID) | OROMUCOSAL | Status: DC
  Administered 2014-09-01 – 2014-09-10 (×17): 15 mL via OROMUCOSAL

## 2014-09-01 MED ORDER — VASOPRESSIN 20 UNIT/ML IV SOLN
0.0300 [IU]/min | INTRAVENOUS | Status: DC
  Filled 2014-09-01: qty 2

## 2014-09-01 MED ORDER — ENOXAPARIN SODIUM 30 MG/0.3ML ~~LOC~~ SOLN
30.0000 mg | SUBCUTANEOUS | Status: DC
  Administered 2014-09-01: 30 mg via SUBCUTANEOUS
  Filled 2014-09-01 (×2): qty 0.3

## 2014-09-01 MED ORDER — FENTANYL CITRATE (PF) 250 MCG/5ML IJ SOLN
INTRAMUSCULAR | Status: AC
  Filled 2014-09-01: qty 5

## 2014-09-01 MED ORDER — SODIUM CHLORIDE 0.9 % IV BOLUS (SEPSIS)
1000.0000 mL | Freq: Once | INTRAVENOUS | Status: AC
  Administered 2014-09-01: 1000 mL via INTRAVENOUS

## 2014-09-01 MED ORDER — MEPERIDINE HCL 25 MG/ML IJ SOLN: 6.2500 mg | INTRAMUSCULAR | Status: DC | PRN

## 2014-09-01 MED ORDER — ARTIFICIAL TEARS OP OINT
TOPICAL_OINTMENT | OPHTHALMIC | Status: DC | PRN
  Administered 2014-09-01: 1 via OPHTHALMIC

## 2014-09-01 MED ORDER — ROCURONIUM BROMIDE 100 MG/10ML IV SOLN
INTRAVENOUS | Status: DC | PRN
  Administered 2014-09-01: 50 mg via INTRAVENOUS
  Administered 2014-09-01: 20 mg via INTRAVENOUS

## 2014-09-01 MED ORDER — LACTATED RINGERS IV SOLN
INTRAVENOUS | Status: DC | PRN
  Administered 2014-09-01: 05:00:00 via INTRAVENOUS

## 2014-09-01 MED ORDER — ACETAMINOPHEN 325 MG PO TABS: 650.0000 mg | ORAL_TABLET | Freq: Four times a day (QID) | ORAL | Status: DC | PRN

## 2014-09-01 MED ORDER — ROCURONIUM BROMIDE 50 MG/5ML IV SOLN
INTRAVENOUS | Status: AC
  Filled 2014-09-01: qty 3

## 2014-09-01 MED ORDER — ALBUMIN HUMAN 5 % IV SOLN
INTRAVENOUS | Status: DC | PRN
  Administered 2014-09-01 (×2): via INTRAVENOUS

## 2014-09-01 MED ORDER — PIPERACILLIN-TAZOBACTAM 3.375 G IVPB 30 MIN
3.3750 g | Freq: Once | INTRAVENOUS | Status: AC
  Administered 2014-09-01: 3.375 g via INTRAVENOUS
  Filled 2014-09-01: qty 50

## 2014-09-01 MED ORDER — LIDOCAINE HCL (CARDIAC) 20 MG/ML IV SOLN: INTRAVENOUS | Status: DC | PRN

## 2014-09-01 MED ORDER — PROPOFOL 1000 MG/100ML IV EMUL
0.0000 ug/kg/min | INTRAVENOUS | Status: DC
  Filled 2014-09-01: qty 100

## 2014-09-01 MED ORDER — MIDAZOLAM HCL 2 MG/2ML IJ SOLN
INTRAMUSCULAR | Status: AC
  Filled 2014-09-01: qty 4

## 2014-09-01 MED ORDER — MIDAZOLAM HCL 2 MG/2ML IJ SOLN
2.0000 mg | INTRAMUSCULAR | Status: DC | PRN
  Administered 2014-09-01 (×2): 2 mg via INTRAVENOUS
  Filled 2014-09-01: qty 2

## 2014-09-01 MED ORDER — MIDAZOLAM HCL 5 MG/5ML IJ SOLN
INTRAMUSCULAR | Status: DC | PRN
  Administered 2014-09-01 (×2): 1 mg via INTRAVENOUS

## 2014-09-01 MED ORDER — 0.9 % SODIUM CHLORIDE (POUR BTL) OPTIME
TOPICAL | Status: DC | PRN
  Administered 2014-09-01: 1000 mL

## 2014-09-01 MED ORDER — NOREPINEPHRINE BITARTRATE 1 MG/ML IV SOLN
0.0000 ug/min | INTRAVENOUS | Status: DC
  Administered 2014-09-01: 2 ug/min via INTRAVENOUS
  Administered 2014-09-01: 8 ug/min via INTRAVENOUS
  Filled 2014-09-01 (×3): qty 4

## 2014-09-01 MED ORDER — DEXMEDETOMIDINE BOLUS VIA INFUSION: 1.0000 ug/kg | Freq: Once | INTRAVENOUS | Status: DC

## 2014-09-01 MED ORDER — ONDANSETRON HCL 4 MG/2ML IJ SOLN: 4.0000 mg | Freq: Four times a day (QID) | INTRAMUSCULAR | Status: DC | PRN

## 2014-09-01 MED ORDER — SODIUM CHLORIDE 0.9 % IV SOLN
INTRAVENOUS | Status: DC
  Administered 2014-09-01 – 2014-09-02 (×3): via INTRAVENOUS

## 2014-09-01 MED ORDER — MAGNESIUM SULFATE 2 GM/50ML IV SOLN
2.0000 g | Freq: Once | INTRAVENOUS | Status: AC
  Administered 2014-09-01: 2 g via INTRAVENOUS
  Filled 2014-09-01: qty 50

## 2014-09-01 MED ORDER — ANTISEPTIC ORAL RINSE SOLUTION (CORINZ)
7.0000 mL | Freq: Four times a day (QID) | OROMUCOSAL | Status: DC
  Administered 2014-09-01 – 2014-09-10 (×19): 7 mL via OROMUCOSAL

## 2014-09-01 MED ORDER — FENTANYL CITRATE (PF) 100 MCG/2ML IJ SOLN: 100.0000 ug | INTRAMUSCULAR | Status: DC | PRN

## 2014-09-01 MED ORDER — DEXTROSE 50 % IV SOLN
INTRAVENOUS | Status: AC
  Administered 2014-09-01: 25 mL
  Filled 2014-09-01: qty 50

## 2014-09-01 MED ORDER — DOBUTAMINE IN D5W 4-5 MG/ML-% IV SOLN
2.5000 ug/kg/min | INTRAVENOUS | Status: DC
  Filled 2014-09-01: qty 250

## 2014-09-01 MED ORDER — PROMETHAZINE HCL 25 MG/ML IJ SOLN: 6.2500 mg | INTRAMUSCULAR | Status: DC | PRN

## 2014-09-01 MED ORDER — MIDAZOLAM HCL 2 MG/2ML IJ SOLN
2.0000 mg | INTRAMUSCULAR | Status: DC | PRN
  Administered 2014-09-02 (×2): 2 mg via INTRAVENOUS
  Filled 2014-09-01 (×3): qty 2

## 2014-09-01 MED ORDER — NOREPINEPHRINE BITARTRATE 1 MG/ML IV SOLN: 5.0000 ug/min | INTRAVENOUS | Status: DC

## 2014-09-01 MED ORDER — SODIUM CHLORIDE 0.9 % IV SOLN
INTRAVENOUS | Status: DC
  Administered 2014-09-01 – 2014-09-03 (×2): via INTRAVENOUS

## 2014-09-01 MED ORDER — CEFOXITIN SODIUM 2 G IV SOLR
2.0000 g | Freq: Once | INTRAVENOUS | Status: AC
  Administered 2014-09-01: 2 g via INTRAVENOUS
  Filled 2014-09-01: qty 2

## 2014-09-01 SURGICAL SUPPLY — 54 items
BLADE SURG ROTATE 9660 (MISCELLANEOUS) IMPLANT
BNDG GAUZE ELAST 4 BULKY (GAUZE/BANDAGES/DRESSINGS) ×3 IMPLANT
CANISTER SUCTION 2500CC (MISCELLANEOUS) ×3 IMPLANT
CHLORAPREP W/TINT 26ML (MISCELLANEOUS) ×3 IMPLANT
COVER MAYO STAND STRL (DRAPES) IMPLANT
COVER SURGICAL LIGHT HANDLE (MISCELLANEOUS) ×3 IMPLANT
DRAPE LAPAROSCOPIC ABDOMINAL (DRAPES) ×3 IMPLANT
DRAPE PROXIMA HALF (DRAPES) IMPLANT
DRAPE UTILITY XL STRL (DRAPES) ×6 IMPLANT
DRAPE WARM FLUID 44X44 (DRAPE) ×3 IMPLANT
DRESSING ALLEVYN LIFE SACRUM (GAUZE/BANDAGES/DRESSINGS) ×3 IMPLANT
DRSG OPSITE POSTOP 4X10 (GAUZE/BANDAGES/DRESSINGS) IMPLANT
DRSG OPSITE POSTOP 4X8 (GAUZE/BANDAGES/DRESSINGS) IMPLANT
ELECT BLADE 6.5 EXT (BLADE) IMPLANT
ELECT CAUTERY BLADE 6.4 (BLADE) ×6 IMPLANT
ELECT REM PT RETURN 9FT ADLT (ELECTROSURGICAL) ×3
ELECTRODE REM PT RTRN 9FT ADLT (ELECTROSURGICAL) ×1 IMPLANT
GAUZE SPONGE 4X4 12PLY STRL (GAUZE/BANDAGES/DRESSINGS) ×3 IMPLANT
GLOVE BIO SURGEON STRL SZ7 (GLOVE) ×6 IMPLANT
GLOVE BIOGEL PI IND STRL 7.5 (GLOVE) ×1 IMPLANT
GLOVE BIOGEL PI INDICATOR 7.5 (GLOVE) ×2
GLOVE ECLIPSE 7.0 STRL STRAW (GLOVE) ×3 IMPLANT
GLOVE EUDERMIC 7 POWDERFREE (GLOVE) ×3 IMPLANT
GOWN STRL REUS W/ TWL LRG LVL3 (GOWN DISPOSABLE) ×3 IMPLANT
GOWN STRL REUS W/TWL LRG LVL3 (GOWN DISPOSABLE) ×6
KIT BASIN OR (CUSTOM PROCEDURE TRAY) ×3 IMPLANT
KIT OSTOMY DRAINABLE 2.75 STR (WOUND CARE) ×3 IMPLANT
KIT ROOM TURNOVER OR (KITS) ×3 IMPLANT
LIGASURE IMPACT 36 18CM CVD LR (INSTRUMENTS) ×3 IMPLANT
NS IRRIG 1000ML POUR BTL (IV SOLUTION) ×6 IMPLANT
PACK GENERAL/GYN (CUSTOM PROCEDURE TRAY) ×3 IMPLANT
PAD ARMBOARD 7.5X6 YLW CONV (MISCELLANEOUS) ×3 IMPLANT
PENCIL BUTTON HOLSTER BLD 10FT (ELECTRODE) IMPLANT
RELOAD PROXIMATE 75MM BLUE (ENDOMECHANICALS) ×6 IMPLANT
SPECIMEN JAR LARGE (MISCELLANEOUS) IMPLANT
SPONGE LAP 18X18 X RAY DECT (DISPOSABLE) ×9 IMPLANT
STAPLER PROXIMATE 75MM BLUE (STAPLE) ×3 IMPLANT
STAPLER VISISTAT 35W (STAPLE) ×3 IMPLANT
SUCTION POOLE TIP (SUCTIONS) ×3 IMPLANT
SUT PDS AB 1 TP1 96 (SUTURE) ×6 IMPLANT
SUT SILK 2 0 (SUTURE) ×2
SUT SILK 2 0 SH CR/8 (SUTURE) ×3 IMPLANT
SUT SILK 2-0 18XBRD TIE 12 (SUTURE) ×1 IMPLANT
SUT SILK 3 0 (SUTURE) ×2
SUT SILK 3 0 SH CR/8 (SUTURE) ×3 IMPLANT
SUT SILK 3-0 18XBRD TIE 12 (SUTURE) ×1 IMPLANT
SUT VIC AB 2-0 CT1 36 (SUTURE) ×3 IMPLANT
SUT VIC AB 3-0 SH 18 (SUTURE) ×6 IMPLANT
TAPE CLOTH SURG 4X10 WHT LF (GAUZE/BANDAGES/DRESSINGS) ×3 IMPLANT
TOWEL OR 17X26 10 PK STRL BLUE (TOWEL DISPOSABLE) ×3 IMPLANT
TRAY FOLEY CATH 16FRSI W/METER (SET/KITS/TRAYS/PACK) IMPLANT
TUBE CONNECTING 12'X1/4 (SUCTIONS)
TUBE CONNECTING 12X1/4 (SUCTIONS) IMPLANT
YANKAUER SUCT BULB TIP NO VENT (SUCTIONS) IMPLANT

## 2014-09-01 NOTE — Progress Notes (Signed)
Patient ID: Steven David, male   DOB: 1955/05/01, 59 y.o.   MRN: [id number] Day of Surgery  Subjective: Pt just out of OR within the last 2 hours.  Labs pending.  intubated  Objective: Vital signs in last 24 hours: Temp:  [97.8 F (36.6 C)-99 F (37.2 C)] 98.4 F (36.9 C) (08/23 0803) Pulse Rate:  [86-96] 86 (08/23 0700) Resp:  [16] 16 (08/23 0700) BP: (83-137)/(52-125) 88/57 mmHg (08/23 0700) SpO2:  [97 %-100 %] 100 % (08/23 0700) Arterial Line BP: (83)/(52) 83/52 mmHg (08/23 0700) FiO2 (%):  [40 %-100 %] 40 % (08/23 0739) Weight:  [58.6 kg (129 lb 3 oz)] 58.6 kg (129 lb 3 oz) (08/23 0700)    Intake/Output from previous day: 08/22 0701 - 08/23 0700 In: 4200 [I.V.:3700; IV Piggyback:500] Out: 750 [Urine:650; Blood:100] Intake/Output this shift:    PE: Abd: soft, wound is packed with some bloody drainage on his dressing, but no active bleeding currently.  Stoma is pink and healthy, no output yet, absent BS Heart: regular Lungs: vented  Lab Results:   Recent Labs  08/31/14 2250  WBC 17.4*  HGB 13.4  HCT 40.7  PLT 104*   BMET  Recent Labs  08/31/14 2250  NA 135  K 5.4*  CL 104  CO2 19*  GLUCOSE 123*  BUN 39*  CREATININE 2.01*  CALCIUM 8.8*   PT/INR No results for input(s): LABPROT, INR in the last 72 hours. CMP     Component Value Date/Time   NA 135 08/31/2014 2250   K 5.4* 08/31/2014 2250   CL 104 08/31/2014 2250   CO2 19* 08/31/2014 2250   GLUCOSE 123* 08/31/2014 2250   BUN 39* 08/31/2014 2250   CREATININE 2.01* 08/31/2014 2250   CALCIUM 8.8* 08/31/2014 2250   PROT 8.1 04/23/2013 1045   ALBUMIN 3.5 04/23/2013 1045   AST 17 04/23/2013 1045   ALT 12 04/23/2013 1045   ALKPHOS 68 04/23/2013 1045   BILITOT 0.3 04/23/2013 1045   GFRNONAA 35* 08/31/2014 2250   GFRAA 41* 08/31/2014 2250   Lipase     Component Value Date/Time   LIPASE 18 04/23/2013 1045       Studies/Results: Ct Abdomen Pelvis Wo Contrast  09/01/2014   CLINICAL DATA:   Nausea, vomiting, lower abdominal and genital pain. Genital swelling. History of thyroid disease and hernia.  EXAM: CT ABDOMEN AND PELVIS WITHOUT CONTRAST  TECHNIQUE: Multidetector CT imaging of the abdomen and pelvis was performed following the standard protocol without IV contrast.  COMPARISON:  05/01/2013  FINDINGS: Motion artifact limits evaluation. Suggestion of infiltration in the left lung base possibly indicating pneumonia or motion artifact. Distal esophageal wall appears thickened and residual contrast material is present in the esophagus. This may be due to reflux disease.  There is diffuse portal venous gas. Gas is demonstrated with in the left upper quadrant and epigastric veins. The configuration of the stomach is abnormal with the body of the stomach towards the left and the greater curvature towards the right. Proximal jejunal loops are in the right upper quadrant. This suggests an internal gastric volvulus. The presence of portal venous gas suggests gastric ischemia. Possible mild pneumatosis in the pyloric region of the stomach. Proximal jejunum in the right upper quadrant are mildly dilated suggesting some degree of obstruction. Findings may indicated internal hernia such as a paraduodenal hernia. Spleen size is normal. Gallbladder is distended. No stones or wall thickening identified. Unenhanced appearance of the pancreas and adrenals is unremarkable.  Mild bilateral hydronephrosis. No ureteral stones identified. No free fluid in the abdomen. Diffusely stool-filled colon.  Pelvis: There is a large left inguinal hernia containing sigmoid colon and small bowel. There is edema within the hernia. Vascular compromise of bowel within the hernia is not excluded but the hernia appears similar to prior study. Prominent stool-filled rectosigmoid colon suggesting impaction. No free fluid in the abdomen. Bladder is decompressed. Degenerative changes in the spine.  IMPRESSION: 1. There appears to be rotation  of the stomach and proximal jejunum suggesting a right paraduodenal hernia with possible torsion. Portal venous gas suggests bowel ischemia.  2. Large left inguinal hernia containing sigmoid colon and small bowel. Edema within the hernia sac. Mild bowel wall thickening. Vascular compromise within the herniated bowel is not excluded.  3. Diffusely stool-filled colon with prominent stool-filled rectosigmoid colon. Possible pneumonia in the left lung base.  These results were called by telephone at the time of interpretation on 09/01/2014 at 12:50 am to Dr. Dina Rich, who verbally acknowledged these results.   Electronically Signed   By: Lucienne Capers M.D.   On: 09/01/2014 00:53    Anti-infectives: Anti-infectives    Start     Dose/Rate Route Frequency Ordered Stop   09/01/14 1600  cefoTEtan (CEFOTAN) 2 g in dextrose 5 % 50 mL IVPB     2 g 100 mL/hr over 30 Minutes Intravenous Every 12 hours 09/01/14 0705 09/02/14 1559   09/01/14 0330  cefOXitin (MEFOXIN) 2 g in dextrose 5 % 50 mL IVPB     2 g 100 mL/hr over 30 Minutes Intravenous  Once 09/01/14 0328 09/01/14 0433   09/01/14 0200  piperacillin-tazobactam (ZOSYN) IVPB 3.375 g     3.375 g 100 mL/hr over 30 Minutes Intravenous  Once 09/01/14 0156 09/01/14 0311       Assessment/Plan  POD 0, s/p ex lap with sigmoid colectomy, colostomy, repair of LIH, Dr. Donne Hazel -labs are currently pending.  Will follow and see where creatinine and potassium, specifically go. -start BID dressing changes this afternoon -WOC consult for routine ostomy care -cont NGT and await bowel function DVT Prophylaxis -SCDs/Lovenox  VDRF -per CCM, appreciate their assistance.  He can be weaned to extubate as tolerates Schizophrenia -lithium and haldol on hold for now given NPO status Hypothyroidism -on IV synthroid for now  Remain in ICU for now   LOS: 0 days    [name] 09/01/2014, 8:05 AM Pager: [phone]

## 2014-09-01 NOTE — Procedures (Signed)
Central Venous Catheter Insertion Procedure Note Steven David [id number] 10-09-1955  Procedure: Insertion of Central Venous Catheter Indications: Assessment of intravascular volume, Drug and/or fluid administration and Frequent blood sampling  Procedure Details Consent: Unable to obtain consent because of emergent medical necessity. Time Out: Verified patient identification, verified procedure, site/side was marked, verified correct patient position, special equipment/implants available, medications/allergies/relevent history reviewed, required imaging and test results available.  Performed  Maximum sterile technique was used including antiseptics, cap, gloves, gown, hand hygiene, mask and sheet. Skin prep: Chlorhexidine; local anesthetic administered A antimicrobial bonded/coated triple lumen catheter was placed in the right internal jugular vein using the Seldinger technique. Ultrasound guidance used.Yes.   Catheter placed to 17 cm. Blood aspirated via all 3 ports and then flushed x 3. Line sutured x 2 and dressing applied.  Evaluation Blood flow good Complications: No apparent complications Patient did tolerate procedure well. Chest X-ray ordered to verify placement.  CXR: pending.  [name] ACNP Maryanna Shape PCCM Pager [phone] till 3 pm If no answer page [phone] 09/01/2014, 8:48 AM

## 2014-09-01 NOTE — ED Notes (Signed)
Report given to [facility]. 

## 2014-09-01 NOTE — ED Notes (Signed)
[facility] here with pt to transfer to [facility] ED

## 2014-09-01 NOTE — H&P (Addendum)
Steven David is an 59 y.o. male.   Chief Complaint: n/v HPI: 59 y.o. Male brought to er at outside facility from his family care home with complaint of emesis. Per er note pt is a resident of [facility] and has been complaining of associated nausea and lower abdominal pain. He underwent evaluation and was transferred here. Upon arrival he is not really able to converse with me and is sleepy.  He occasionally just asks for water.  His caretaker who states she is poa is also here.    Past Medical History  Diagnosis Date  . Schizophrenia   . Hernia   . Thyroid disease   . High cholesterol   . HOH (hard of hearing)     Past Surgical History  Procedure Laterality Date  . Leg surgery    . Testicle surgery      No family history on file. Social History:  reports that he has quit smoking. He does not have any smokeless tobacco history on file. He reports that he does not drink alcohol or use illicit drugs.  Allergies: No Known Allergies  meds reviewed  Results for orders placed or performed during the hospital encounter of 08/31/14 (from the past 48 hour(s))  Urinalysis, Routine w reflex microscopic (not at [facility])     Status: Abnormal   Collection Time: 08/31/14 10:39 PM  Result Value Ref Range   Color, Urine YELLOW YELLOW   APPearance CLEAR CLEAR   Specific Gravity, Urine 1.020 1.005 - 1.030   pH 5.5 5.0 - 8.0   Glucose, UA NEGATIVE NEGATIVE mg/dL   Hgb urine dipstick TRACE (A) NEGATIVE   Bilirubin Urine NEGATIVE NEGATIVE   Ketones, ur TRACE (A) NEGATIVE mg/dL   Protein, ur NEGATIVE NEGATIVE mg/dL   Urobilinogen, UA 0.2 0.0 - 1.0 mg/dL   Nitrite NEGATIVE NEGATIVE   Leukocytes, UA NEGATIVE NEGATIVE  Urine microscopic-add on     Status: Abnormal   Collection Time: 08/31/14 10:39 PM  Result Value Ref Range   Squamous Epithelial / LPF FEW (A) RARE   WBC, UA 0-2 <3 WBC/hpf   RBC / HPF 0-2 <3 RBC/hpf   Bacteria, UA FEW (A) RARE  Basic metabolic panel     Status:  Abnormal   Collection Time: 08/31/14 10:50 PM  Result Value Ref Range   Sodium 135 135 - 145 mmol/L   Potassium 5.4 (H) 3.5 - 5.1 mmol/L   Chloride 104 101 - 111 mmol/L   CO2 19 (L) 22 - 32 mmol/L   Glucose, Bld 123 (H) 65 - 99 mg/dL   BUN 39 (H) 6 - 20 mg/dL   Creatinine, Ser 2.01 (H) 0.61 - 1.24 mg/dL   Calcium 8.8 (L) 8.9 - 10.3 mg/dL   GFR calc non Af Amer 35 (L) >60 mL/min   GFR calc Af Amer 41 (L) >60 mL/min    Comment: (NOTE) The eGFR has been calculated using the CKD EPI equation. This calculation has not been validated in all clinical situations. eGFR's persistently <60 mL/min signify possible Chronic Kidney Disease.    Anion gap 12 5 - 15  CBC with Differential     Status: Abnormal   Collection Time: 08/31/14 10:50 PM  Result Value Ref Range   WBC 17.4 (H) 4.0 - 10.5 K/uL    Comment: REPEATED TO VERIFY   RBC 4.49 4.22 - 5.81 MIL/uL   Hemoglobin 13.4 13.0 - 17.0 g/dL   HCT 40.7 39.0 - 52.0 %  MCV 90.6 78.0 - 100.0 fL   MCH 29.8 26.0 - 34.0 pg   MCHC 32.9 30.0 - 36.0 g/dL   RDW 14.5 11.5 - 15.5 %   Platelets 104 (L) 150 - 400 K/uL    Comment: REPEATED TO VERIFY SPECIMEN CHECKED FOR CLOTS    Neutrophils Relative % 85 (H) 43 - 77 %   Neutro Abs 14.8 (H) 1.7 - 7.7 K/uL   Lymphocytes Relative 5 (L) 12 - 46 %   Lymphs Abs 0.9 0.7 - 4.0 K/uL   Monocytes Relative 10 3 - 12 %   Monocytes Absolute 1.7 (H) 0.1 - 1.0 K/uL   Eosinophils Relative 0 0 - 5 %   Eosinophils Absolute 0.0 0.0 - 0.7 K/uL   Basophils Relative 0 0 - 1 %   Basophils Absolute 0.0 0.0 - 0.1 K/uL   WBC Morphology VACUOLATED NEUTROPHILS     Comment: ATYPICAL LYMPHOCYTES MILD LEFT SHIFT (1-5% METAS, OCC MYELO, OCC BANDS)    RBC Morphology TEARDROP CELLS    Smear Review PLATELETS APPEAR DECREASED     Comment: LARGE PLATELETS PRESENT  Lactic acid, plasma     Status: Abnormal   Collection Time: 09/01/14 12:59 AM  Result Value Ref Range   Lactic Acid, Venous 4.7 (HH) 0.5 - 2.0 mmol/L    Comment:  CRITICAL RESULT CALLED TO, READ BACK BY AND VERIFIED WITH: SHORE L AT 0150 ON [id number] BY FORSYTH K    Ct Abdomen Pelvis Wo Contrast  09/01/2014   CLINICAL DATA:  Nausea, vomiting, lower abdominal and genital pain. Genital swelling. History of thyroid disease and hernia.  EXAM: CT ABDOMEN AND PELVIS WITHOUT CONTRAST  TECHNIQUE: Multidetector CT imaging of the abdomen and pelvis was performed following the standard protocol without IV contrast.  COMPARISON:  05/01/2013  FINDINGS: Motion artifact limits evaluation. Suggestion of infiltration in the left lung base possibly indicating pneumonia or motion artifact. Distal esophageal wall appears thickened and residual contrast material is present in the esophagus. This may be due to reflux disease.  There is diffuse portal venous gas. Gas is demonstrated with in the left upper quadrant and epigastric veins. The configuration of the stomach is abnormal with the body of the stomach towards the left and the greater curvature towards the right. Proximal jejunal loops are in the right upper quadrant. This suggests an internal gastric volvulus. The presence of portal venous gas suggests gastric ischemia. Possible mild pneumatosis in the pyloric region of the stomach. Proximal jejunum in the right upper quadrant are mildly dilated suggesting some degree of obstruction. Findings may indicated internal hernia such as a paraduodenal hernia. Spleen size is normal. Gallbladder is distended. No stones or wall thickening identified. Unenhanced appearance of the pancreas and adrenals is unremarkable. Mild bilateral hydronephrosis. No ureteral stones identified. No free fluid in the abdomen. Diffusely stool-filled colon.  Pelvis: There is a large left inguinal hernia containing sigmoid colon and small bowel. There is edema within the hernia. Vascular compromise of bowel within the hernia is not excluded but the hernia appears similar to prior study. Prominent stool-filled  rectosigmoid colon suggesting impaction. No free fluid in the abdomen. Bladder is decompressed. Degenerative changes in the spine.  IMPRESSION: 1. 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 Portal venous gas suggests bowel ischemia.  2. Large left inguinal hernia containing sigmoid colon and small bowel. Edema within the hernia sac. Mild bowel wall thickening. Vascular compromise  within the herniated bowel is not excluded.  3. Diffusely stool-filled colon with prominent stool-filled rectosigmoid colon. Possible pneumonia in the left lung base.  These results were called by telephone at the time of interpretation on 09/01/2014 at 12:50 am to Dr. Dina Rich, who verbally acknowledged these results.   Electronically Signed   By: Lucienne Capers M.D.   On: 09/01/2014 00:53    Review of Systems  Unable to perform ROS: critical illness    Blood pressure 99/67, pulse 96, temperature 99 F (37.2 C), temperature source Oral, resp. rate 16, SpO2 100 %. Physical Exam  Constitutional: Vital signs are normal. He appears cachectic. He has a sickly appearance.  Eyes: No scleral icterus.  Cardiovascular: Normal rate, regular rhythm and normal heart sounds.   Respiratory: Effort normal and breath sounds normal.  GI: He exhibits no distension. Bowel sounds are decreased. There is tenderness (upper abdominal tender to palpation). A hernia (large lih chronically incarcerated, nontender) is present.  Lymphadenopathy:    He has no cervical adenopathy.     Assessment/Plan  Possible gastric volvulus It appears he may gastric volvulus by ct scan or paraduodenal hernia. With elevated lactate and portal venous gas concern is for dead bowel/stomach.  His cr is doubled over normal also.  I think he needs to go to or emergently for exploration. He is not able to consent right now. His caretaker is with him and she states she is poa.  I have  explained this to him as well as her. I discussed exploration with possible removal of intestine, partial or total gastrectomy.  I also discussed possibility of multiple operations as well as icu stay and possible intubation for prolonged period.  I think there is a decent mortality rate for this problem for him due to underlying medical status and his appearance of malnutrition.  He is a full code and does feed himself, walks with cane at the family care home he lives in.   I do not think this is his longstanding incarcerated inguinal hernia and will not plan on repairing that tonight.  Todrick Siedschlag 09/01/2014, 3:34 AM

## 2014-09-01 NOTE — Anesthesia Procedure Notes (Signed)
Anesthesia Procedure Note Arterial line: sterile chlorohex prep, #20ga Arrow catheter into L radial artery (after unsuccessful attempts x2 R rad artery), sterile dressing on   Jenita Seashore, MD

## 2014-09-01 NOTE — ED Notes (Signed)
Report given to Randall Hiss RN the charge RN at [facility]

## 2014-09-01 NOTE — Transfer of Care (Signed)
Immediate Anesthesia Transfer of Care Note  Patient: Steven David  Procedure(s) Performed: Procedure(s): EXPLORATORY LAPAROTOMY, SIGMOID COLECTOMY, SIGMOID COLOSTOMY, PRIMARY REPAIR OF STRANGULATED INGUINAL HERNIA. (N/A)  Patient Location: ICU  Anesthesia Type:General  Level of Consciousness: sedated and Patient remains intubated per anesthesia plan  Airway & Oxygen Therapy: Patient remains intubated per anesthesia plan and Patient placed on Ventilator (see vital sign flow sheet for setting)  Post-op Assessment: Report given to RN and Post -op Vital signs reviewed and stable  Post vital signs: Reviewed and stable  Last Vitals:  Filed Vitals:   09/01/14 0158  BP: 99/67  Pulse: 96  Temp: 37.2 C  Resp: 16    Complications: No apparent anesthesia complications

## 2014-09-01 NOTE — Consult Note (Signed)
PULMONARY / CRITICAL CARE MEDICINE   Name: Steven David MRN: [id number] DOB: [birth year]    ADMISSION DATE:  08/31/2014 CONSULTATION DATE:  8/22  REFERRING MD :  CCS  CHIEF COMPLAINT:  N/V  INITIAL PRESENTATION:  N/V  STUDIES:    SIGNIFICANT EVENTS:    HISTORY OF PRESENT ILLNESS:   59 yo group home member(scizophrenic)with long standing LIH, who was taken to OR 8/22 with strangulated LIH, necrotic bowel which required, Sigmoid colectomy/colostomy. He was left intubated per [facility] but can be extubated once stable. His abg reveals metabolic acidosis(mild) and he is currently on precedex. We will begin weaning as tolerated.  PAST MEDICAL HISTORY :   has a past medical history of Schizophrenia; Hernia; Thyroid disease; High cholesterol; and HOH (hard of hearing).  has past surgical history that includes Leg Surgery and Testicle surgery. Prior to Admission medications   Medication Sig Start Date End Date Taking? Authorizing Provider  docusate sodium (COLACE) 100 MG capsule Take 100 mg by mouth at bedtime as needed for mild constipation.   Yes Historical Provider, MD  feeding supplement, ENSURE, (ENSURE) PUDG Take 1 Container by mouth 3 (three) times daily between meals. 04/16/13  Yes Sinda Du, MD  fish oil-omega-3 fatty acids 1000 MG capsule Take 1 g by mouth 3 (three) times daily.   Yes Historical Provider, MD  guaifenesin (ROBITUSSIN) 100 MG/5ML syrup Take 200 mg by mouth every 4 (four) hours as needed for cough.   Yes Historical Provider, MD  haloperidol (HALDOL) 10 MG tablet Take 10 mg by mouth every evening.   Yes Historical Provider, MD  haloperidol decanoate (HALDOL DECANOATE) 100 MG/ML injection Inject 100 mg into the muscle every 28 (twenty-eight) days. Administered by [employer]   Yes Historical Provider, MD  levothyroxine (SYNTHROID, LEVOTHROID) 100 MCG tablet Take 100 mcg by mouth daily before breakfast.   Yes Historical Provider, MD  lithium carbonate 300 MG capsule Take  300 mg by mouth 2 (two) times daily.   Yes Historical Provider, MD  loratadine (CLARITIN) 10 MG tablet Take 10 mg by mouth daily.   Yes Historical Provider, MD  lovastatin (MEVACOR) 20 MG tablet Take 20 mg by mouth daily.   Yes Historical Provider, MD  Maltodextrin-Xanthan Gum (RESOURCE THICKENUP CLEAR) POWD Take 1 Container by mouth as needed (Used to make liquids nectar thick). 04/16/13  Yes Sinda Du, MD  oxybutynin (DITROPAN-XL) 10 MG 24 hr tablet Take 10 mg by mouth daily.   Yes Historical Provider, MD  polyethylene glycol powder (GLYCOLAX/MIRALAX) powder Take 17 g by mouth daily. Dissolved in 8 ounces of water/juice daily   Yes Historical Provider, MD   No Known Allergies  FAMILY HISTORY:  has no family status information on file.  SOCIAL HISTORY:  reports that he has quit smoking. He does not have any smokeless tobacco history on file. He reports that he does not drink alcohol or use illicit drugs.  REVIEW OF SYSTEMS:  NA  SUBJECTIVE:   VITAL SIGNS: Temp:  [97.8 F (36.6 C)-99 F (37.2 C)] 99 F (37.2 C) (08/23 0158) Pulse Rate:  [86-96] 86 (08/23 0700) Resp:  [16] 16 (08/23 0700) BP: (83-137)/(52-125) 88/57 mmHg (08/23 0700) SpO2:  [97 %-100 %] 100 % (08/23 0700) Arterial Line BP: (83)/(52) 83/52 mmHg (08/23 0700) FiO2 (%):  [40 %-100 %] 40 % (08/23 0739) Weight:  [129 lb 3 oz (58.6 kg)] 129 lb 3 oz (58.6 kg) (08/23 0700) HEMODYNAMICS:   VENTILATOR SETTINGS: Vent Mode:  [-] PRVC  FiO2 (%):  [40 %-100 %] 40 % Set Rate:  [16 bmp-20 bmp] 20 bmp Vt Set:  [550 mL] 550 mL PEEP:  [5 cmH20] 5 cmH20 Plateau Pressure:  [12 cmH20-16 cmH20] 12 cmH20 INTAKE / OUTPUT:  Intake/Output Summary (Last 24 hours) at 09/01/14 0750 Last data filed at 09/01/14 0645  Gross per 24 hour  Intake   4200 ml  Output    750 ml  Net   3450 ml    PHYSICAL EXAMINATION: General:  Sedated on vent Neuro:  WD to pain, sedated on vent HEENT: no jvd/lan, OTT->vent Cardiovascular:  HSr RRR  62 Lungs:  CTA Abdomen:  Dressing CDI, gastric tube with bloody drainage Musculoskeletal:  intact Skin: left cold, no dorsalis pedis pulse to palpation or doppler  LABS:  CBC  Recent Labs Lab 08/31/14 2250  WBC 17.4*  HGB 13.4  HCT 40.7  PLT 104*   Coag's No results for input(s): APTT, INR in the last 168 hours. BMET  Recent Labs Lab 08/31/14 2250  NA 135  K 5.4*  CL 104  CO2 19*  BUN 39*  CREATININE 2.01*  GLUCOSE 123*   Electrolytes  Recent Labs Lab 08/31/14 2250  CALCIUM 8.8*   Sepsis Markers  Recent Labs Lab 09/01/14 0059  LATICACIDVEN 4.7*   ABG No results for input(s): PHART, PCO2ART, PO2ART in the last 168 hours. Liver Enzymes No results for input(s): AST, ALT, ALKPHOS, BILITOT, ALBUMIN in the last 168 hours. Cardiac Enzymes No results for input(s): TROPONINI, PROBNP in the last 168 hours. Glucose  Recent Labs Lab 09/01/14 0627  GLUCAP 87    Imaging Ct Abdomen Pelvis Wo Contrast  09/01/2014   CLINICAL DATA:  Nausea, vomiting, lower abdominal and genital pain. Genital swelling. History of thyroid disease and hernia.  EXAM: CT ABDOMEN AND PELVIS WITHOUT CONTRAST  TECHNIQUE: Multidetector CT imaging of the abdomen and pelvis was performed following the standard protocol without IV contrast.  COMPARISON:  05/01/2013  FINDINGS: Motion artifact limits evaluation. Suggestion of infiltration in the left lung base possibly indicating pneumonia or motion artifact. Distal esophageal wall appears thickened and residual contrast material is present in the esophagus. This may be due to reflux disease.  There is diffuse portal venous gas. Gas is demonstrated with in the left upper quadrant and epigastric veins. The configuration of the stomach is abnormal with the body of the stomach towards the left and the greater curvature towards the right. Proximal jejunal loops are in the right upper quadrant. This suggests an internal gastric volvulus. The presence of  portal venous gas suggests gastric ischemia. Possible mild pneumatosis in the pyloric region of the stomach. Proximal jejunum in the right upper quadrant are mildly dilated suggesting some degree of obstruction. Findings may indicated internal hernia such as a paraduodenal hernia. Spleen size is normal. Gallbladder is distended. No stones or wall thickening identified. Unenhanced appearance of the pancreas and adrenals is unremarkable. Mild bilateral hydronephrosis. No ureteral stones identified. No free fluid in the abdomen. Diffusely stool-filled colon.  Pelvis: There is a large left inguinal hernia containing sigmoid colon and small bowel. There is edema within the hernia. Vascular compromise of bowel within the hernia is not excluded but the hernia appears similar to prior study. Prominent stool-filled rectosigmoid colon suggesting impaction. No free fluid in the abdomen. Bladder is decompressed. Degenerative changes in the spine.  IMPRESSION: 1. 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  Portal venous gas suggests bowel ischemia.  2. Large left inguinal hernia containing sigmoid colon and small bowel. Edema within the hernia sac. Mild bowel wall thickening. Vascular compromise within the herniated bowel is not excluded.  3. Diffusely stool-filled colon with prominent stool-filled rectosigmoid colon. Possible pneumonia in the left lung base.  These results were called by telephone at the time of interpretation on 09/01/2014 at 12:50 am to Dr. Dina Rich, who verbally acknowledged these results.   Electronically Signed   By: Lucienne Capers M.D.   On: 09/01/2014 00:53     ASSESSMENT / PLAN:  PULMONARY OETT 8/23>> A: VDRF post colon resection complicated by metabolic acidosis P:   Vent bundle Wean per protocol  CARDIOVASCULAR CVL rt i j 8/23>> Left radial a line>> A:  Post op hypotension Cool left foot P:  DC  diprivan Fluid bolus May need pressor support  cvl placement and monitor cvp  Doppler studies lower ext Vascular surgery consult  RENAL Lab Results  Component Value Date   CREATININE 2.01* 08/31/2014   CREATININE 0.98 04/23/2013   CREATININE 0.84 04/16/2013    A:   Renal insuff P:   Hydrate Follow creatine  Avoid nephrotoxins   GASTROINTESTINAL A:   Post bowel resection P:   PPI May need TNA  HEMATOLOGIC A:   No acute issue P:    INFECTIOUS A:   Post necrotic bowel  P:    Abx:  8/23 zoysn>>  ENDOCRINE A:  Hypothroid P:   IV synthroid and check TSH  NEUROLOGIC A:   Baseline schizophrenic, able to walk and feed self Note extubation will be complicated by inability to give lithium with npo STATUS P:   RASS goal: 0 Precedex and opiates till ready for extubation and acidosis corrected  PRN benzo Check lithium level   FAMILY  - Updates: Lives in group home.  - Inter-disciplinary family meet or Palliative Care meeting due by:  day [place], [place] C: same   TODAY'S SUMMARY:  59 yo group home member(scizophrenic)with long standing LIH, who was taken to OR 8/22 with strangulated LIH, necrotic bowel which required, Sigmoid colectomy/colostomy. He was left intubated per [facility] but can be extubated once stable. His abg reveals metabolic acidosis(mild) and he is currently on precedex. We will begin weaning as tolerated.  Richardson Landry Minor ACNP Maryanna Shape PCCM Pager [phone] till 3 pm If no answer page [phone] 09/01/2014, 7:56 AM

## 2014-09-01 NOTE — Consult Note (Signed)
WOC ostomy consult note Stoma type/location:  Current ostomy pouch is leaking. Stomal assessment/size: Stoma is red and viable, with old dried blood to the base.  Small amt red blood leaking from the suture line at the base.  Stoma above skin level, 1 1/2 inches Peristomal assessment: Intact skin surrounding  Treatment options for stomal/peristomal skin: Applied barrier ring Output: 20cc blood-tinged drainage in the pouch Ostomy pouching: 1pc.  Education provided: Pt sedated, no family present. Applied one piece pouch with barrier ring to maintain seal until bloody drainage is resolved.  Supplies at bedside for staff nurse use. Enrolled patient in [place] program: No Will begin educational sessions when stable and out of ICU. Julien Girt MSN, RN, [place], [place], [place]

## 2014-09-01 NOTE — ED Provider Notes (Addendum)
Patient signed out from Dr. Wilson Singer pending CT scan. Patient with nausea and abdominal pain. CT was concerning findings including gas in the portal venous system suggestive of bowel ischemia. Likely secondary to paraduodenal hernia with volvulus and or early obstruction in right inguinal hernia. Patient officially made nothing by mouth. Lactate added. Patient given a second liter of fluids. Surgery consulted.  Dr. Arnoldo Morale, general surgery feels patient is too complex to stay at [facility]. [place] surgery consulted. Discussed with Dr. Donne Hazel. Patient to be transferred to [facility] ER for further evaluation.  Guardian on patient's paperwork is De Blanch, home phone [phone]. Per patient she makes his decisions. I discussed patient's diagnosis with Ms. Thomas. Advised her that it would be best if she met the patient at [facility] ER to discuss surgery with Dr. Donne Hazel.  She stated understanding and stated she would be there.  1:53 AM Lactate 4.7.  Second liter of fluids going.  Patient given IV Zosyn.  CRITICAL CARE Performed by: Merryl Hacker   Total critical care time: 35 min  Critical care time was exclusive of separately billable procedures and treating other patients.  Critical care was necessary to treat or prevent imminent or life-threatening deterioration.  Critical care was time spent personally by me on the following activities: development of treatment plan with patient and/or surrogate as well as nursing, discussions with consultants, evaluation of patient's response to treatment, examination of patient, obtaining history from patient or surrogate, ordering and performing treatments and interventions, ordering and review of laboratory studies, ordering and review of radiographic studies, pulse oximetry and re-evaluation of patient's condition.   Results for orders placed or performed during the hospital encounter of 08/31/14  Urinalysis, Routine w reflex  microscopic (not at [facility])  Result Value Ref Range   Color, Urine YELLOW YELLOW   APPearance CLEAR CLEAR   Specific Gravity, Urine 1.020 1.005 - 1.030   pH 5.5 5.0 - 8.0   Glucose, UA NEGATIVE NEGATIVE mg/dL   Hgb urine dipstick TRACE (A) NEGATIVE   Bilirubin Urine NEGATIVE NEGATIVE   Ketones, ur TRACE (A) NEGATIVE mg/dL   Protein, ur NEGATIVE NEGATIVE mg/dL   Urobilinogen, UA 0.2 0.0 - 1.0 mg/dL   Nitrite NEGATIVE NEGATIVE   Leukocytes, UA NEGATIVE NEGATIVE  Basic metabolic panel  Result Value Ref Range   Sodium 135 135 - 145 mmol/L   Potassium 5.4 (H) 3.5 - 5.1 mmol/L   Chloride 104 101 - 111 mmol/L   CO2 19 (L) 22 - 32 mmol/L   Glucose, Bld 123 (H) 65 - 99 mg/dL   BUN 39 (H) 6 - 20 mg/dL   Creatinine, Ser 2.01 (H) 0.61 - 1.24 mg/dL   Calcium 8.8 (L) 8.9 - 10.3 mg/dL   GFR calc non Af Amer 35 (L) >60 mL/min   GFR calc Af Amer 41 (L) >60 mL/min   Anion gap 12 5 - 15  CBC with Differential  Result Value Ref Range   WBC 17.4 (H) 4.0 - 10.5 K/uL   RBC 4.49 4.22 - 5.81 MIL/uL   Hemoglobin 13.4 13.0 - 17.0 g/dL   HCT 40.7 39.0 - 52.0 %   MCV 90.6 78.0 - 100.0 fL   MCH 29.8 26.0 - 34.0 pg   MCHC 32.9 30.0 - 36.0 g/dL   RDW 14.5 11.5 - 15.5 %   Platelets 104 (L) 150 - 400 K/uL   Neutrophils Relative % 85 (H) 43 - 77 %   Neutro Abs 14.8 (  H) 1.7 - 7.7 K/uL   Lymphocytes Relative 5 (L) 12 - 46 %   Lymphs Abs 0.9 0.7 - 4.0 K/uL   Monocytes Relative 10 3 - 12 %   Monocytes Absolute 1.7 (H) 0.1 - 1.0 K/uL   Eosinophils Relative 0 0 - 5 %   Eosinophils Absolute 0.0 0.0 - 0.7 K/uL   Basophils Relative 0 0 - 1 %   Basophils Absolute 0.0 0.0 - 0.1 K/uL   WBC Morphology VACUOLATED NEUTROPHILS    RBC Morphology TEARDROP CELLS    Smear Review PLATELETS APPEAR DECREASED   Urine microscopic-add on  Result Value Ref Range   Squamous Epithelial / LPF FEW (A) RARE   WBC, UA 0-2 <3 WBC/hpf   RBC / HPF 0-2 <3 RBC/hpf   Bacteria, UA FEW (A) RARE   Ct Abdomen Pelvis Wo  Contrast  09/01/2014   CLINICAL DATA:  Nausea, vomiting, lower abdominal and genital pain. Genital swelling. History of thyroid disease and hernia.  EXAM: CT ABDOMEN AND PELVIS WITHOUT CONTRAST  TECHNIQUE: Multidetector CT imaging of the abdomen and pelvis was performed following the standard protocol without IV contrast.  COMPARISON:  05/01/2013  FINDINGS: Motion artifact limits evaluation. Suggestion of infiltration in the left lung base possibly indicating pneumonia or motion artifact. Distal esophageal wall appears thickened and residual contrast material is present in the esophagus. This may be due to reflux disease.  There is diffuse portal venous gas. Gas is demonstrated with in the left upper quadrant and epigastric veins. The configuration of the stomach is abnormal with the body of the stomach towards the left and the greater curvature towards the right. Proximal jejunal loops are in the right upper quadrant. This suggests an internal gastric volvulus. The presence of portal venous gas suggests gastric ischemia. Possible mild pneumatosis in the pyloric region of the stomach. Proximal jejunum in the right upper quadrant are mildly dilated suggesting some degree of obstruction. Findings may indicated internal hernia such as a paraduodenal hernia. Spleen size is normal. Gallbladder is distended. No stones or wall thickening identified. Unenhanced appearance of the pancreas and adrenals is unremarkable. Mild bilateral hydronephrosis. No ureteral stones identified. No free fluid in the abdomen. Diffusely stool-filled colon.  Pelvis: There is a large left inguinal hernia containing sigmoid colon and small bowel. There is edema within the hernia. Vascular compromise of bowel within the hernia is not excluded but the hernia appears similar to prior study. Prominent stool-filled rectosigmoid colon suggesting impaction. No free fluid in the abdomen. Bladder is decompressed. Degenerative changes in the spine.   IMPRESSION: 1. 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 Portal venous gas suggests bowel ischemia.  2. Large left inguinal hernia containing sigmoid colon and small bowel. Edema within the hernia sac. Mild bowel wall thickening. Vascular compromise within the herniated bowel is not excluded.  3. Diffusely stool-filled colon with prominent stool-filled rectosigmoid colon. Possible pneumonia in the left lung base.  These results were called by telephone at the time of interpretation on 09/01/2014 at 12:50 am to Dr. Dina Rich, who verbally acknowledged these results.   Electronically Signed   By: Lucienne Capers M.D.   On: 09/01/2014 00:53      Merryl Hacker, MD 09/01/14 0140  Merryl Hacker, MD 09/01/14 [phone]

## 2014-09-01 NOTE — ED Notes (Signed)
Dr. Donne Hazel at beside

## 2014-09-01 NOTE — Brief Op Note (Signed)
Pink Coccyx pad applied to coccyx post op.

## 2014-09-01 NOTE — Consult Note (Addendum)
STAFF NOTE: I, Dr Ann Lions have personally reviewed patient's available data, including medical history, events of note, physical examination and test results as part of my evaluation. I have discussed with resident/NP and other care providers such as pharmacist, RN and RRT.  In addition,  I personally evaluated patient and elicited key findings of   S:  59 year old group home patient with long standing schizophrenia. S/p emergnt colectomy and colostomy for necrotic bowel due to strangulated inguinal hernia . Now with post op acute resp failure and circulatory shock and AKI.   O: On vent Intubated Looks chronically ill and critically ill Left foot cooler by left PT can be dopplered  Abd is stender  RASS -2 on precedex gtt   PULMONARY  Recent Labs Lab 09/01/14 0450  PHART 7.311*  PCO2ART 35.4  PO2ART 325.0*  HCO3 17.9*  TCO2 19  O2SAT 100.0    CBC  Recent Labs Lab 08/31/14 2250 09/01/14 0450 09/01/14 0900  HGB 13.4 10.2* 9.8*  HCT 40.7 30.0* 29.7*  WBC 17.4*  --  10.1  PLT 104*  --  80*    COAGULATION No results for input(s): INR in the last 168 hours.  CARDIAC  No results for input(s): TROPONINI in the last 168 hours. No results for input(s): PROBNP in the last 168 hours.   CHEMISTRY  Recent Labs Lab 08/31/14 2250 09/01/14 0450 09/01/14 0900  NA 135 139 139  K 5.4* 3.8 4.2  CL 104  --  115*  CO2 19*  --  19*  GLUCOSE 123*  --  102*  BUN 39*  --  36*  CREATININE 2.01*  --  1.70*  CALCIUM 8.8*  --  7.0*   CrCl cannot be calculated (Unknown ideal weight.).   LIVER No results for input(s): AST, ALT, ALKPHOS, BILITOT, PROT, ALBUMIN, INR in the last 168 hours.   INFECTIOUS  Recent Labs Lab 09/01/14 0059 09/01/14 0900 09/01/14 1125  LATICACIDVEN 4.7*  --  1.1  PROCALCITON  --  33.48  --      ENDOCRINE CBG (last 3)   Recent Labs  09/01/14 0627  GLUCAP 87         IMAGING x48h  - image(s) personally visualized  -   highlighted  in bold Ct Abdomen Pelvis Wo Contrast  09/01/2014   CLINICAL DATA:  Nausea, vomiting, lower abdominal and genital pain. Genital swelling. History of thyroid disease and hernia.  EXAM: CT ABDOMEN AND PELVIS WITHOUT CONTRAST  TECHNIQUE: Multidetector CT imaging of the abdomen and pelvis was performed following the standard protocol without IV contrast.  COMPARISON:  05/01/2013  FINDINGS: Motion artifact limits evaluation. Suggestion of infiltration in the left lung base possibly indicating pneumonia or motion artifact. Distal esophageal wall appears thickened and residual contrast material is present in the esophagus. This may be due to reflux disease.  There is diffuse portal venous gas. Gas is demonstrated with in the left upper quadrant and epigastric veins. The configuration of the stomach is abnormal with the body of the stomach towards the left and the greater curvature towards the right. Proximal jejunal loops are in the right upper quadrant. This suggests an internal gastric volvulus. The presence of portal venous gas suggests gastric ischemia. Possible mild pneumatosis in the pyloric region of the stomach. Proximal jejunum in the right upper quadrant are mildly dilated suggesting some degree of obstruction. Findings may indicated internal hernia such as a paraduodenal hernia. Spleen size is normal. Gallbladder is distended. No  stones or wall thickening identified. Unenhanced appearance of the pancreas and adrenals is unremarkable. Mild bilateral hydronephrosis. No ureteral stones identified. No free fluid in the abdomen. Diffusely stool-filled colon.  Pelvis: There is a large left inguinal hernia containing sigmoid colon and small bowel. There is edema within the hernia. Vascular compromise of bowel within the hernia is not excluded but the hernia appears similar to prior study. Prominent stool-filled rectosigmoid colon suggesting impaction. No free fluid in the abdomen. Bladder is decompressed.  Degenerative changes in the spine.  IMPRESSION: 1. 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 Portal venous gas suggests bowel ischemia.  2. Large left inguinal hernia containing sigmoid colon and small bowel. Edema within the hernia sac. Mild bowel wall thickening. Vascular compromise within the herniated bowel is not excluded.  3. Diffusely stool-filled colon with prominent stool-filled rectosigmoid colon. Possible pneumonia in the left lung base.  These results were called by telephone at the time of interpretation on 09/01/2014 at 12:50 am to Dr. Dina Rich, who verbally acknowledged these results.   Electronically Signed   By: Lucienne Capers M.D.   On: 09/01/2014 00:53   Dg Chest Port 1 View  09/01/2014   CLINICAL DATA:  Central line placement  EXAM: PORTABLE CHEST - 1 VIEW  COMPARISON:  700 hours  FINDINGS: Right internal jugular central venous catheter has been placed. Tip is at the cavoatrial junction. There is no pneumothorax. Endotracheal and NG tubes are stable. Stable appearance of the heart and lungs.  IMPRESSION: Right internal jugular central venous catheter placement with its tip at the cavoatrial junction and no pneumothorax.   Electronically Signed   By: Marybelle Killings M.D.   On: 09/01/2014 09:22   Dg Chest Port 1 View  09/01/2014   CLINICAL DATA:  Intubated.  EXAM: PORTABLE CHEST - 1 VIEW  COMPARISON:  04/23/2013 and chest CT dated 05/01/2013.  FINDINGS: Normal sized heart. Endotracheal tube in satisfactory position. Nasogastric tube tip in the proximal stomach. Clear lungs. Minimally prominent interstitial markings. Diffuse osteopenia.  IMPRESSION: 1. Endotracheal tube in satisfactory position. 2. Minimal chronic interstitial lung disease.   Electronically Signed   By: Claudie Revering M.D.   On: 09/01/2014 08:19       A:  Post op acute resp failure - anticipate difficulty weaning due to shock, critical  illness with baseline schizophrenia Septic Shock - started in pressors AKi - post op - improving Baseline schizophrenia - on lithium, -functional at baseline - anticipate delirium due to all of abve + difficulty doing po lithium in setting of plpa Post op pain  P:  Full vent support Start EDGT septic shock protocol Anticipate AKI improvement Anticipate difficulty with quick extubation due to all of above Might need to involve psychiatry   .  Rest per NP/medical resident whose note is outlined above and that I agree with  The patient is critically ill with multiple organ systems failure and requires high complexity decision making for assessment and support, frequent evaluation and titration of therapies, application of advanced monitoring technologies and extensive interpretation of multiple databases.   Critical Care Time devoted to patient care services described in this note is  30  Minutes. This time reflects time of care of this signee Dr Brand Males. This critical care time does not reflect procedure time, or teaching time or supervisory time of PA/NP/Med student/Med Resident etc but could involve care discussion time  Dr. Brand Males, M.D., [facility].C.P Pulmonary and Critical Care Medicine Staff Physician [facility] Pager: [phone], If no answer or between  15:00h - 7:00h: call [phone]  09/01/2014 12:00 PM

## 2014-09-01 NOTE — Progress Notes (Signed)
[place] Progress Note Patient Name: AKI ABALOS DOB: [birth year] MRN: [id number]   Date of Service  09/01/2014  HPI/Events of Note  Admitted to ICU from OR on mechanical ventilation.   eICU Interventions  Will order: 1. Ventilator Orders: 100%/PRVC 16/TV 550/P 5 2. ABG at 7:30 AM. 3. Portable CXR now. 4. SCD's and Protonix IV.      Intervention Category Major Interventions: Respiratory failure - evaluation and management  Sommer,Steven Eugene 09/01/2014, 6:49 AM

## 2014-09-01 NOTE — Progress Notes (Signed)
VASCULAR LAB PRELIMINARY  ARTERIAL  ABI completed:    RIGHT    LEFT    PRESSURE WAVEFORM  PRESSURE WAVEFORM  BRACHIAL 119 Triphasic BRACHIAL 118 Triphasic  DP 125 Triphasic DP 120 Triphasic  AT   AT    PT 114 Triphasic PT 145 Triphasic  PER   PER    GREAT TOE 83 NA GREAT TOE 52 NA    RIGHT LEFT  ABI 1.05 1.22   Bilateral ABIs are within normal limits. The right great toe pressure is within normal limits, the left great toe pressure is abnormal. There is a 34mmHg difference between right and left great toe pressures.  09/01/2014 6:45 PM Maudry Mayhew, RVT, RDCS, RDMS

## 2014-09-01 NOTE — ED Notes (Signed)
CRITICAL VALUE ALERT  Critical value received:  Lactic Acid - 4.7  Date of notification:  09/01/2014  Time of notification:  0151  Critical value read back: yes  Nurse who received alert:  LJS  MD notified (1st page):  Dr Dina Rich  Time of first page:  0152  MD notified (2nd page):  Time of second page:  Responding MD:  Dr Dina Rich  Time MD responded:  [phone]

## 2014-09-01 NOTE — Anesthesia Preprocedure Evaluation (Addendum)
Anesthesia Evaluation  Patient identified by MRN, date of birth, ID band Patient confused    Reviewed: Allergy & Precautions, NPO status , Patient's Chart, lab work & pertinent test resultsPreop documentation limited or incomplete due to emergent nature of procedure.  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, 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)     Musculoskeletal negative musculoskeletal ROS (+)   Abdominal   Peds  Hematology  (+) Blood dyscrasia (thrombocytopenia), ,   Anesthesia Other Findings   Reproductive/Obstetrics                          Anesthesia Physical Anesthesia Plan  ASA: IV and emergent  Anesthesia Plan: General   Post-op Pain Management:    Induction: Intravenous and Cricoid pressure planned  Airway Management Planned: Oral ETT  Additional Equipment: Arterial line  Intra-op Plan:   Post-operative Plan: Post-operative intubation/ventilation  Informed Consent:   History available from chart only and Only emergency history available  Plan Discussed with: CRNA and Surgeon  Anesthesia Plan Comments: (Plan routine monitors, A line, GETA with post op ventilation)        Anesthesia Quick Evaluation

## 2014-09-01 NOTE — Progress Notes (Signed)
[place] Progress Note Patient Name: MONTEE TALLMAN DOB: April 27, 1955 MRN: [id number]   Date of Service  09/01/2014  HPI/Events of Note  Wide complex on monitor.   eICU Interventions  EKG+TROPONIN     Intervention Category Intermediate Interventions: Diagnostic test evaluation  Luella Cook 09/01/2014, 7:37 PM

## 2014-09-01 NOTE — Progress Notes (Signed)
eLink Physician-Brief Progress Note Patient Name: Steven David DOB: [birth year] MRN: [id number]   Date of Service  09/01/2014  HPI/Events of Note  Low mag  eICU Interventions  Repleted with 2 gm     Intervention Category Intermediate Interventions: Electrolyte abnormality - evaluation and management  [name] 09/01/2014, 5:42 PM

## 2014-09-01 NOTE — Consult Note (Addendum)
WOC ostomy consult note CCS following for assessment and plan of care to abd wound. Stoma type/location: Colostomy surgery performed on 8/23 in early AM; stoma in place to LLQ Stomal assessment/size: Red and moist, above skin level when visualized through pouch Output: Small amt blood-tinged drainage in pouch, no stool or flatus.  Ostomy pouching: 2pc.  Education provided: Pt is intubated and no family members are at the bedside. Currently, he is having a procedure and  is hypotensive.  Supplies ordered to room for bedside nurse and educational materials left at bedside. Will begin teaching sessions when stable and out of ICU. Enrolled patient in [place] program: No Julien Girt MSN, [place], Regino Ramirez, [place], [place]

## 2014-09-01 NOTE — Op Note (Addendum)
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 Sigmoid colectomy 2. Sigmoid colostomy 3. Primary repair of strangulated LIH Surgeon: Dr Serita Grammes Asst: Dr Fanny Skates EBL: minimal Complications none Specimens: sigmoid colon, stitch proximal Drains none Anesthesia general Sponge and needle count correct times two dispo to icu intubated  Indications: This is a 63 yom who presents with new onset n/v and abdominal pain.  He appears on ct to have gastric volvulus. Has longstanding chronically incarcerated LIH.  I discussed with he and his caretaker going to or due to lactic acidosis, ct findings for exploration. He was not really verbal prior to going to or.  Procedure: After informed consent obtained he was taken to the or. This was done via his caretaker.  He was given abx, scds were in place.  He was then placed under general anesthesia without complication.  An ng tube and foley were placed.  His abdomen was prepped and draped in the standard sterile surgical fashion.  A timeout was performed.  A midline laparotomy was performed.  There was some murky fluid present.  I explored his entire abdomen.  His stomach was very distended but it was viable. I then evaluated his duodenum which was normal.  I then explored his small bowel from the ligament of Treitz.  It was viable until it went into the left inguinal hernia.  There it became dilated and then was injected.  I then reduced the hernia in its entirety.  The small bowel was angry but I evaluated this several times and found this to be viable. I decided not to remove any of this.  I then reduced the sigmoid colon and this had patchy necrosis to it.  I then divided the sigmoid colon proximally and distally with the gia stapler.  I divided the mesentery with a combination of the ligasure device and silk sutures.  This was passed off the  table.  I then reinspected the small bowel and found it to be viable.  I irrigated.  I then brought the sigmoid colon out through the left abdominal wall. I was not going to place mesh due to risk of contamination.  I ended up repairing the hernia with 2-0 vicryl suture and closing the defect down.   I then closed the fascia with #1 looped PDS.  I left the wound open and placed a wet to dry.  I then matured the colostomy with 2-0 vicryl.  I placed an appliance.  He was then transferred to the icu intubated.

## 2014-09-02 ENCOUNTER — Encounter (HOSPITAL_COMMUNITY): Payer: Self-pay | Admitting: General Surgery

## 2014-09-02 ENCOUNTER — Inpatient Hospital Stay (HOSPITAL_COMMUNITY): Payer: Medicare Other

## 2014-09-02 DIAGNOSIS — F203 Undifferentiated schizophrenia: Secondary | ICD-10-CM

## 2014-09-02 DIAGNOSIS — R569 Unspecified convulsions: Secondary | ICD-10-CM

## 2014-09-02 LAB — CBC
HCT: 29.7 % — ABNORMAL LOW (ref 39.0–52.0)
Hemoglobin: 10 g/dL — ABNORMAL LOW (ref 13.0–17.0)
MCH: 29.8 pg (ref 26.0–34.0)
MCHC: 33.7 g/dL (ref 30.0–36.0)
MCV: 88.4 fL (ref 78.0–100.0)
PLATELETS: 86 10*3/uL — AB (ref 150–400)
RBC: 3.36 MIL/uL — ABNORMAL LOW (ref 4.22–5.81)
RDW: 15 % (ref 11.5–15.5)
WBC: 11.7 10*3/uL — AB (ref 4.0–10.5)

## 2014-09-02 LAB — BASIC METABOLIC PANEL
ANION GAP: 8 (ref 5–15)
BUN: 26 mg/dL — ABNORMAL HIGH (ref 6–20)
CALCIUM: 7.3 mg/dL — AB (ref 8.9–10.3)
CO2: 17 mmol/L — AB (ref 22–32)
CREATININE: 1.39 mg/dL — AB (ref 0.61–1.24)
Chloride: 116 mmol/L — ABNORMAL HIGH (ref 101–111)
GFR, EST NON AFRICAN AMERICAN: 55 mL/min — AB (ref >60)
Glucose, Bld: 101 mg/dL — ABNORMAL HIGH (ref 65–99)
Potassium: 3.8 mmol/L (ref 3.5–5.1)
SODIUM: 141 mmol/L (ref 135–145)

## 2014-09-02 LAB — HEPATIC FUNCTION PANEL
ALK PHOS: 36 U/L — AB (ref 38–126)
ALT: 18 U/L (ref 17–63)
AST: 34 U/L (ref 15–41)
Albumin: 2.2 g/dL — ABNORMAL LOW (ref 3.5–5.0)
BILIRUBIN INDIRECT: 0.9 mg/dL (ref 0.3–0.9)
Bilirubin, Direct: 0.3 mg/dL (ref 0.1–0.5)
TOTAL PROTEIN: 4.6 g/dL — AB (ref 6.5–8.1)
Total Bilirubin: 1.2 mg/dL (ref 0.3–1.2)

## 2014-09-02 LAB — GLUCOSE, CAPILLARY
GLUCOSE-CAPILLARY: 94 mg/dL (ref 65–99)
Glucose-Capillary: 101 mg/dL — ABNORMAL HIGH (ref 65–99)
Glucose-Capillary: 88 mg/dL (ref 65–99)

## 2014-09-02 LAB — PHOSPHORUS: Phosphorus: 2.7 mg/dL (ref 2.5–4.6)

## 2014-09-02 LAB — ALBUMIN: Albumin: 2.3 g/dL — ABNORMAL LOW (ref 3.5–5.0)

## 2014-09-02 LAB — PROCALCITONIN: PROCALCITONIN: 26.69 ng/mL

## 2014-09-02 LAB — MAGNESIUM: MAGNESIUM: 2 mg/dL (ref 1.7–2.4)

## 2014-09-02 MED ORDER — DEXTROSE 5 % IV SOLN
500.0000 mg | Freq: Three times a day (TID) | INTRAVENOUS | Status: DC
  Administered 2014-09-02 – 2014-09-03 (×3): 500 mg via INTRAVENOUS
  Filled 2014-09-02 (×6): qty 5

## 2014-09-02 MED ORDER — SODIUM CHLORIDE 0.9 % IV SOLN
500.0000 mg | Freq: Two times a day (BID) | INTRAVENOUS | Status: DC
  Filled 2014-09-02: qty 5

## 2014-09-02 MED ORDER — CHLORHEXIDINE GLUCONATE 0.12 % MT SOLN
15.0000 mL | Freq: Two times a day (BID) | OROMUCOSAL | Status: DC
  Administered 2014-09-02 (×2): 15 mL via OROMUCOSAL

## 2014-09-02 MED ORDER — SODIUM CHLORIDE 0.9 % IV SOLN
1000.0000 mg | Freq: Once | INTRAVENOUS | Status: AC
  Administered 2014-09-02: 1000 mg via INTRAVENOUS
  Filled 2014-09-02: qty 10

## 2014-09-02 MED ORDER — DEXTROSE 5 % IV SOLN
2.0000 g | INTRAVENOUS | Status: DC
  Administered 2014-09-02: 2 g via INTRAVENOUS
  Filled 2014-09-02 (×2): qty 2

## 2014-09-02 MED ORDER — CETYLPYRIDINIUM CHLORIDE 0.05 % MT LIQD
7.0000 mL | Freq: Two times a day (BID) | OROMUCOSAL | Status: DC
  Administered 2014-09-02: 7 mL via OROMUCOSAL

## 2014-09-02 MED ORDER — ZIPRASIDONE MESYLATE 20 MG IM SOLR
10.0000 mg | Freq: Four times a day (QID) | INTRAMUSCULAR | Status: DC | PRN
  Filled 2014-09-02 (×2): qty 20

## 2014-09-02 MED ORDER — SILVER NITRATE-POT NITRATE 75-25 % EX MISC
1.0000 "application " | CUTANEOUS | Status: DC | PRN
  Filled 2014-09-02: qty 1

## 2014-09-02 NOTE — Progress Notes (Signed)
Pt seizing at this time. Seizure lasted approx. 3 minutes. All extremities involved. HR 150s ST on the monitor. BP hypertensive.  Versed given and Elink MD put orders in at this time. Will closely monitor patient at this time.

## 2014-09-02 NOTE — Progress Notes (Signed)
Patient ID: Steven David, male   DOB: 1955/02/24, 59 y.o.   MRN: [id number] 1 Day Post-Op  Subjective: Pt still intubated, but weaning.  On small amount of pressor support.  Had new onset GTC seizure x2 last night.      Objective: Vital signs in last 24 hours: Temp:  [97.9 F (36.6 C)-99 F (37.2 C)] 98 F (36.7 C) (08/24 0750) Pulse Rate:  [59-146] 104 (08/24 0759) Resp:  [14-28] 14 (08/24 0759) BP: (76-144)/(44-92) 138/62 mmHg (08/24 0759) SpO2:  [99 %-100 %] 100 % (08/24 0759) Arterial Line BP: (74-163)/(34-83) 118/62 mmHg (08/24 0700) FiO2 (%):  [40 %] 40 % (08/24 0759) Weight:  [58.4 kg (128 lb 12 oz)] 58.4 kg (128 lb 12 oz) (08/24 0300)    Intake/Output from previous day: 08/23 0701 - 08/24 0700 In: 7643.7 [I.V.:6433.7; IV Piggyback:1210] Out: 5053 [Urine:4100; Emesis/NG output:325; Stool:20] Intake/Output this shift: Total I/O In: -  Out: 325 [Urine:325]  PE: Abd: soft, minimal BS, but no NGT output at all, colostomy with bloody drainage, stoma pink and viable.  Midline wound with general raw surface ooze, but one specific skin edge bleeder.  Pressure was held over 5 minutes and this seemed to have stopped.  Midline wound repacked. Heart: regular, mildly tachy  Lab Results:   Recent Labs  09/01/14 1625 09/02/14 0021  WBC 11.4* 11.7*  HGB 10.3* 10.0*  HCT 31.0* 29.7*  PLT 93* 86*   BMET  Recent Labs  09/01/14 1625 09/02/14 0021  NA 141 141  K 3.8 3.8  CL 117* 116*  CO2 17* 17*  GLUCOSE 119* 101*  BUN 30* 26*  CREATININE 1.38* 1.39*  CALCIUM 7.1* 7.3*   PT/INR No results for input(s): LABPROT, INR in the last 72 hours. CMP     Component Value Date/Time   NA 141 09/02/2014 0021   K 3.8 09/02/2014 0021   CL 116* 09/02/2014 0021   CO2 17* 09/02/2014 0021   GLUCOSE 101* 09/02/2014 0021   BUN 26* 09/02/2014 0021   CREATININE 1.39* 09/02/2014 0021   CALCIUM 7.3* 09/02/2014 0021   PROT 4.6* 09/02/2014 0446   ALBUMIN 2.2* 09/02/2014 0446   AST  34 09/02/2014 0446   ALT 18 09/02/2014 0446   ALKPHOS 36* 09/02/2014 0446   BILITOT 1.2 09/02/2014 0446   GFRNONAA 55* 09/02/2014 0021   GFRAA >60 09/02/2014 0021   Lipase     Component Value Date/Time   LIPASE 18 04/23/2013 1045       Studies/Results: Ct Abdomen Pelvis Wo Contrast  09/01/2014   CLINICAL DATA:  Nausea, vomiting, lower abdominal and genital pain. Genital swelling. History of thyroid disease and hernia.  EXAM: CT ABDOMEN AND PELVIS WITHOUT CONTRAST  TECHNIQUE: Multidetector CT imaging of the abdomen and pelvis was performed following the standard protocol without IV contrast.  COMPARISON:  05/01/2013  FINDINGS: Motion artifact limits evaluation. Suggestion of infiltration in the left lung base possibly indicating pneumonia or motion artifact. Distal esophageal wall appears thickened and residual contrast material is present in the esophagus. This may be due to reflux disease.  There is diffuse portal venous gas. Gas is demonstrated with in the left upper quadrant and epigastric veins. The configuration of the stomach is abnormal with the body of the stomach towards the left and the greater curvature towards the right. Proximal jejunal loops are in the right upper quadrant. This suggests an internal gastric volvulus. The presence of portal venous gas suggests gastric ischemia. Possible mild pneumatosis  in the pyloric region of the stomach. Proximal jejunum in the right upper quadrant are mildly dilated suggesting some degree of obstruction. Findings may indicated internal hernia such as a paraduodenal hernia. Spleen size is normal. Gallbladder is distended. No stones or wall thickening identified. Unenhanced appearance of the pancreas and adrenals is unremarkable. Mild bilateral hydronephrosis. No ureteral stones identified. No free fluid in the abdomen. Diffusely stool-filled colon.  Pelvis: There is a large left inguinal hernia containing sigmoid colon and small bowel. There is  edema within the hernia. Vascular compromise of bowel within the hernia is not excluded but the hernia appears similar to prior study. Prominent stool-filled rectosigmoid colon suggesting impaction. No free fluid in the abdomen. Bladder is decompressed. Degenerative changes in the spine.  IMPRESSION: 1. 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 Portal venous gas suggests bowel ischemia.  2. Large left inguinal hernia containing sigmoid colon and small bowel. Edema within the hernia sac. Mild bowel wall thickening. Vascular compromise within the herniated bowel is not excluded.  3. Diffusely stool-filled colon with prominent stool-filled rectosigmoid colon. Possible pneumonia in the left lung base.  These results were called by telephone at the time of interpretation on 09/01/2014 at 12:50 am to Dr. Dina Rich, who verbally acknowledged these results.   Electronically Signed   By: Lucienne Capers M.D.   On: 09/01/2014 00:53   Dg Chest Port 1 View  09/02/2014   CLINICAL DATA:  History of endotracheal tube.  History of seizure.  EXAM: PORTABLE CHEST - 1 VIEW  COMPARISON:  09/01/2014  FINDINGS: Magdalene Molly is difficult to visualize but the endotracheal tube appears to be approximately 2.9 cm above the expected location of the carina. Negative for a pneumothorax. Right central line tip in the SVC region. Nasogastric tube in the gastric fundus region. Lungs are clear without airspace disease. Heart size is normal.  IMPRESSION: No focal lung disease.  Support apparatuses as described.   Electronically Signed   By: Markus Daft M.D.   On: 09/02/2014 07:16   Dg Chest Port 1 View  09/01/2014   CLINICAL DATA:  Central line placement  EXAM: PORTABLE CHEST - 1 VIEW  COMPARISON:  700 hours  FINDINGS: Right internal jugular central venous catheter has been placed. Tip is at the cavoatrial junction. There is no pneumothorax. Endotracheal and NG tubes  are stable. Stable appearance of the heart and lungs.  IMPRESSION: Right internal jugular central venous catheter placement with its tip at the cavoatrial junction and no pneumothorax.   Electronically Signed   By: Marybelle Killings M.D.   On: 09/01/2014 09:22   Dg Chest Port 1 View  09/01/2014   CLINICAL DATA:  Intubated.  EXAM: PORTABLE CHEST - 1 VIEW  COMPARISON:  04/23/2013 and chest CT dated 05/01/2013.  FINDINGS: Normal sized heart. Endotracheal tube in satisfactory position. Nasogastric tube tip in the proximal stomach. Clear lungs. Minimally prominent interstitial markings. Diffuse osteopenia.  IMPRESSION: 1. Endotracheal tube in satisfactory position. 2. Minimal chronic interstitial lung disease.   Electronically Signed   By: Claudie Revering M.D.   On: 09/01/2014 08:19    Anti-infectives: Anti-infectives    Start     Dose/Rate Route Frequency Ordered Stop   09/01/14 1600  cefoTEtan (CEFOTAN) 2 g in dextrose 5 % 50 mL IVPB     2 g 100 mL/hr over 30 Minutes Intravenous Every 12 hours 09/01/14 0705 09/02/14 0433   09/01/14  0330  cefOXitin (MEFOXIN) 2 g in dextrose 5 % 50 mL IVPB     2 g 100 mL/hr over 30 Minutes Intravenous  Once 09/01/14 0328 09/01/14 0433   09/01/14 0200  piperacillin-tazobactam (ZOSYN) IVPB 3.375 g     3.375 g 100 mL/hr over 30 Minutes Intravenous  Once 09/01/14 0156 09/01/14 0311       Assessment/Plan POD 1, s/p ex lap with sigmoid colectomy, colostomy, repair of LIH, Dr. Donne Hazel -cont BID dressing changes, if wound continues to ooze and bleed, I have obtained silver nitrate sticks in order to help cauterize this -may DC NGT if able to extubate today as he has no output at all -WOC consult for routine ostomy care, appreciate their assistance -mobilize when able (after extubation) -will reinitiate abx therapy given ischemic colon, Rocephin D2 (total) -abdominal binder to help with compression on abdominal wound DVT Prophylaxis -SCDs -lovenox held due to general  oozing and thrombocytopenia  Thrombocytopenia -appears chronic as he came in this way.  Generally oozing.  Hold chemical prophylaxis VDRF -per CCM, appreciate their assistance. He can be weaned to extubate as tolerates Schizophrenia -lithium and haldol on hold for now given NPO status Hypothyroidism -on IV synthroid for now New onset seizures -neurology on board.  Appreciate their assitance -CT of his head today -keppra on board as well as versed prn  Remain in ICU   LOS: 1 day    Keelee Yankey E 09/02/2014, 8:16 AM Pager: [phone]

## 2014-09-02 NOTE — Progress Notes (Signed)
Subjective: New onset seizures overnight, broke with versed.   Exam: Filed Vitals:   09/02/14 0800  BP: 127/75  Pulse: 105  Temp:   Resp: 12   Gen: In bed, NAD Resp: non-labored breathing, no acute distress Abd: soft, nt  Neuro: MS: awake, follows commands.  ES:LPNP eye externally deviated compared to right, pt states baseline.  Motor: lifts all extremities from bed to command.   Pertinent Labs: Lithium level 0.54(L) BMP - mildly low calcium, but would not expect this level to cause seizures.  Mg - nml  Impression: 59 yo M with longstanding schizophrenia(I suspect schizoaffective given that he is on lithium) disorder who presents with new onset seizures. I would favor depakote vs keppra in someone with his comorbidities.   Recommendations: 1) start depakote 500mg  BID(will give extra dose today) 2) Discontinue keppra tomorrow.  3) eeg  Roland Rack, MD [facility] [phone]  If 7pm- 7am, please page neurology on call as listed in [place].

## 2014-09-02 NOTE — Consult Note (Signed)
PULMONARY / CRITICAL CARE MEDICINE   Name: FUAD FORGET MRN: [id number] DOB: 1955-11-15    ADMISSION DATE:  08/31/2014 CONSULTATION DATE:  8/22  REFERRING MD :  CCS  CHIEF COMPLAINT:  N/V  INITIAL PRESENTATION:   59 yo group home member(scizophrenic)with long standing LIH, who was taken to OR 8/22 with strangulated LIH, necrotic bowel which required, Sigmoid colectomy/colostomy. He was left intubated per CCS but can be extubated once stable. His abg reveals metabolic acidosis(mild) and he is currently on precedex. We will begin weaning as tolerated.   SUBJECTIVE:   09/02/2014 Meets SBT Criteria. Had seizure x 2 - Rx with versed.  On Keppra initially but now on depakote. CT head pending. CCS ok with extubation. Denies abd pain   VITAL SIGNS: Temp:  [97.9 F (36.6 C)-99 F (37.2 C)] 98 F (36.7 C) (08/24 0750) Pulse Rate:  [59-146] 114 (08/24 1000) Resp:  [10-28] 16 (08/24 1000) BP: (78-144)/(44-92) 111/84 mmHg (08/24 1000) SpO2:  [99 %-100 %] 100 % (08/24 1000) Arterial Line BP: (74-163)/(34-75) 160/74 mmHg (08/24 1000) FiO2 (%):  [40 %] 40 % (08/24 0759) Weight:  [58.4 kg (128 lb 12 oz)] 58.4 kg (128 lb 12 oz) (08/24 0300) HEMODYNAMICS: CVP:  [4 mmHg-17 mmHg] 4 mmHg VENTILATOR SETTINGS: Vent Mode:  [-] PSV FiO2 (%):  [40 %] 40 % Set Rate:  [20 bmp] 20 bmp Vt Set:  [550 mL] 550 mL PEEP:  [5 cmH20] 5 cmH20 Pressure Support:  [5 cmH20] 5 cmH20 Plateau Pressure:  [13 cmH20-17 cmH20] 17 cmH20 INTAKE / OUTPUT:  Intake/Output Summary (Last 24 hours) at 09/02/14 1029 Last data filed at 09/02/14 1000  Gross per 24 hour  Intake 5756.76 ml  Output   4270 ml  Net 1486.76 ml    PHYSICAL EXAMINATION: General:  Sedated on vent Neuro:  WD to pain, sedated on vent HEENT: no jvd/lan, OTT->vent Cardiovascular:  HSr RRR 62 Lungs:  CTA Abdomen:  Dressing CDI, gastric tube with bloody drainage Musculoskeletal:  intact Skin: left cold, no dorsalis pedis pulse to palpation or  doppler  LABS:  PULMONARY  Recent Labs Lab 09/01/14 0450 09/01/14 1255  PHART 7.311*  --   PCO2ART 35.4  --   PO2ART 325.0*  --   HCO3 17.9*  --   TCO2 19  --   O2SAT 100.0 92.3    CBC  Recent Labs Lab 09/01/14 0900 09/01/14 1625 09/02/14 0021  HGB 9.8* 10.3* 10.0*  HCT 29.7* 31.0* 29.7*  WBC 10.1 11.4* 11.7*  PLT 80* 93* 86*    COAGULATION No results for input(s): INR in the last 168 hours.  CARDIAC   Recent Labs Lab 09/01/14 1954  TROPONINI 0.06*   No results for input(s): PROBNP in the last 168 hours.   CHEMISTRY  Recent Labs Lab 08/31/14 2250 09/01/14 0450 09/01/14 0900 09/01/14 1625 09/02/14 0021 09/02/14 0446  NA 135 139 139 141 141  --   K 5.4* 3.8 4.2 3.8 3.8  --   CL 104  --  115* 117* 116*  --   CO2 19*  --  19* 17* 17*  --   GLUCOSE 123*  --  102* 119* 101*  --   BUN 39*  --  36* 30* 26*  --   CREATININE 2.01*  --  1.70* 1.38* 1.39*  --   CALCIUM 8.8*  --  7.0* 7.1* 7.3*  --   MG  --   --   --  1.4*  --  2.0  PHOS  --   --   --  2.9  --  2.7   Estimated Creatinine Clearance: 48.4 mL/min (by C-G formula based on Cr of 1.39).   LIVER  Recent Labs Lab 09/02/14 0446 09/02/14 0930  AST 34  --   ALT 18  --   ALKPHOS 36*  --   BILITOT 1.2  --   PROT 4.6*  --   ALBUMIN 2.2* 2.3*     INFECTIOUS  Recent Labs Lab 09/01/14 0059 09/01/14 0900 09/01/14 1125 09/02/14 0446  LATICACIDVEN 4.7*  --  1.1  --   PROCALCITON  --  33.48  --  26.69     ENDOCRINE CBG (last 3)   Recent Labs  09/01/14 1940 09/01/14 2345 09/02/14 0428  GLUCAP 105* 94 88         IMAGING x48h  - image(s) personally visualized  -   highlighted in bold Ct Abdomen Pelvis Wo Contrast  09/01/2014   CLINICAL DATA:  Nausea, vomiting, lower abdominal and genital pain. Genital swelling. History of thyroid disease and hernia.  EXAM: CT ABDOMEN AND PELVIS WITHOUT CONTRAST  TECHNIQUE: Multidetector CT imaging of the abdomen and pelvis was performed  following the standard protocol without IV contrast.  COMPARISON:  05/01/2013  FINDINGS: Motion artifact limits evaluation. Suggestion of infiltration in the left lung base possibly indicating pneumonia or motion artifact. Distal esophageal wall appears thickened and residual contrast material is present in the esophagus. This may be due to reflux disease.  There is diffuse portal venous gas. Gas is demonstrated with in the left upper quadrant and epigastric veins. The configuration of the stomach is abnormal with the body of the stomach towards the left and the greater curvature towards the right. Proximal jejunal loops are in the right upper quadrant. This suggests an internal gastric volvulus. The presence of portal venous gas suggests gastric ischemia. Possible mild pneumatosis in the pyloric region of the stomach. Proximal jejunum in the right upper quadrant are mildly dilated suggesting some degree of obstruction. Findings may indicated internal hernia such as a paraduodenal hernia. Spleen size is normal. Gallbladder is distended. No stones or wall thickening identified. Unenhanced appearance of the pancreas and adrenals is unremarkable. Mild bilateral hydronephrosis. No ureteral stones identified. No free fluid in the abdomen. Diffusely stool-filled colon.  Pelvis: There is a large left inguinal hernia containing sigmoid colon and small bowel. There is edema within the hernia. Vascular compromise of bowel within the hernia is not excluded but the hernia appears similar to prior study. Prominent stool-filled rectosigmoid colon suggesting impaction. No free fluid in the abdomen. Bladder is decompressed. Degenerative changes in the spine.  IMPRESSION: 1. 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 Portal venous gas suggests bowel ischemia.  2. Large left inguinal hernia containing sigmoid colon and small bowel. Edema within the  hernia sac. Mild bowel wall thickening. Vascular compromise within the herniated bowel is not excluded.  3. Diffusely stool-filled colon with prominent stool-filled rectosigmoid colon. Possible pneumonia in the left lung base.  These results were called by telephone at the time of interpretation on 09/01/2014 at 12:50 am to Dr. Dina Rich, who verbally acknowledged these results.   Electronically Signed   By: Lucienne Capers M.D.   On: 09/01/2014 00:53   Dg Chest Port 1 View  09/02/2014   CLINICAL DATA:  History of endotracheal tube.  History of seizure.  EXAM: PORTABLE  CHEST - 1 VIEW  COMPARISON:  09/01/2014  FINDINGS: Magdalene Molly is difficult to visualize but the endotracheal tube appears to be approximately 2.9 cm above the expected location of the carina. Negative for a pneumothorax. Right central line tip in the SVC region. Nasogastric tube in the gastric fundus region. Lungs are clear without airspace disease. Heart size is normal.  IMPRESSION: No focal lung disease.  Support apparatuses as described.   Electronically Signed   By: Markus Daft M.D.   On: 09/02/2014 07:16   Dg Chest Port 1 View  09/01/2014   CLINICAL DATA:  Central line placement  EXAM: PORTABLE CHEST - 1 VIEW  COMPARISON:  700 hours  FINDINGS: Right internal jugular central venous catheter has been placed. Tip is at the cavoatrial junction. There is no pneumothorax. Endotracheal and NG tubes are stable. Stable appearance of the heart and lungs.  IMPRESSION: Right internal jugular central venous catheter placement with its tip at the cavoatrial junction and no pneumothorax.   Electronically Signed   By: Marybelle Killings M.D.   On: 09/01/2014 09:22   Dg Chest Port 1 View  09/01/2014   CLINICAL DATA:  Intubated.  EXAM: PORTABLE CHEST - 1 VIEW  COMPARISON:  04/23/2013 and chest CT dated 05/01/2013.  FINDINGS: Normal sized heart. Endotracheal tube in satisfactory position. Nasogastric tube tip in the proximal stomach. Clear lungs. Minimally prominent  interstitial markings. Diffuse osteopenia.  IMPRESSION: 1. Endotracheal tube in satisfactory position. 2. Minimal chronic interstitial lung disease.   Electronically Signed   By: Claudie Revering M.D.   On: 09/01/2014 08:19       ASSESSMENT / PLAN:  PULMONARY OETT 8/23>> A: VDRF post colon resection complicated by metabolic acidosis   - meets extubation criteria P:   Extubate   CARDIOVASCULAR CVL rt i j 8/23>> Left radial a line>> A:  Post op hypotension -/ septic shock  Cool left foot due to hypotension - L PT was dopplerable   - off pressors  P:  Dc sepsis protiocl  RENAL Lab Results  Component Value Date   CREATININE 1.39* 09/02/2014   CREATININE 1.38* 09/01/2014   CREATININE 1.70* 09/01/2014    A:   Renal insuff 0- improving with hydration  P:   Hydrate Follow creatine  Avoid nephrotoxins   GASTROINTESTINAL A:   Post bowel resection P:   PPI May need TNA  HEMATOLOGIC A:   No acute issue P:    INFECTIOUS A:   Post necrotic bowel  P:    Abx:  8/23 zoysn>>8.24, CEftriazone 09/02/14 >>  ENDOCRINE A:  Hypothroid P:   IV synthroid and check TSH  NEUROLOGIC A:   Baseline schizophrenic, able to walk and feed self - now iinability to give lithium with npo STATUS   - new seizures 09/02/2014 needing keppra and neuro consult    P:   RASS goal: 0 PRN benzo and opioids Neuro consult appreciated Ct head 09/02/2014 Pscyh consult called      FAMILY  - Updates: Lives in group home. None at bedside 09/02/2014. [place], [place] C: same   - Inter-disciplinary family meet or Palliative Care meeting due by:  day 7 which is 09/08/14    The patient is critically ill with multiple organ systems failure and requires high complexity decision making for assessment and support, frequent evaluation and titration of therapies, application of advanced monitoring technologies and extensive interpretation of multiple databases.   Critical  Care Time devoted to patient care  services described in this note is  30  Minutes. This time reflects time of care of this signee Dr Brand Males. This critical care time does not reflect procedure time, or teaching time or supervisory time of PA/NP/Med student/Med Resident etc but could involve care discussion time    Dr. Brand Males, M.D., [facility].C.P Pulmonary and Critical Care Medicine Staff Physician [facility] Pager: [phone], If no answer or between  15:00h - 7:00h: call [phone]  09/02/2014 10:40 AM

## 2014-09-02 NOTE — Progress Notes (Addendum)
Pt seizing, HR 130s-140s ST on monitor, BP hyptertensive. Versed 2mg  given. Seizing stopped. Elink MD notifed at this time. Told to observe and give versed prn. States we will notify neurology if 1 more seizure occurs. Will continue to closely monitor Pt.

## 2014-09-02 NOTE — Progress Notes (Signed)
STAT EEG Completed; Results Pending   

## 2014-09-02 NOTE — Procedures (Signed)
ELECTROENCEPHALOGRAM REPORT  Patient: Steven David       Room #: 7H21 EEG No. ID: [id number] Age: 59 y.o.        Sex: male Referring Physician: CCS, M Report Date:  09/02/2014        Interpreting Physician: Anthony Sar  History: Steven David is an 59 y.o. male with a history of schizophrenia admitted with strangulated inguinal hernia, surgery complicated postoperatively with acute renal failure, septic shock and AK I. Patient had a witnessed generalized seizure on the morning of 09/02/2014.  Indications for study:    Technique: This is an 18 channel routine scalp EEG performed at the bedside with bipolar and monopolar montages arranged in accordance to the international 10/20 system of electrode placement.   Description: Patient was intubated on mechanical ventilation the time of this study.Recording was performed during wakefulness and during sleep Predominant activity during wakefulness consisted of 9  Hz alpha rhythm. Photic stimulation was not performed. There was symmetrical slowing of background activity with mixed irregular delta and theta activity diffusely during sleep. Symmetrical vertex waves, sleep spindles and K-complexes are recorded during stage II of sleep. No epileptiform discharges were recorded. There was no abnormal slowing of cerebral activity.  Interpretation: This is a normal EEG recording during wakefulness and during sleep. No evidence of an epileptic disorder was demonstrated. However, a normal EEG recording in and of itself does not rule out seizure disorder.  Rush Farmer M.D. [facility] [phone]

## 2014-09-02 NOTE — Consult Note (Signed)
Consult Reason for Consult:new onset seizure Referring Physician: Dr Oletta Darter CCM  CC:   HPI: Steven David is an 59 y.o. male hx of schizophrenia, group home patient admitted with strangulated inguinal hernia s/p emergent colectomy and colostomy on 8/23. Post-op course complicated by acute respiratory failure, septic shock and AKI. On morning of 8/24 had 2-3 minute GTC seizure broken by Versed. Loaded with keppra 1000mg  BID with resolution of seizure activity.   Past Medical History  Diagnosis Date  . Schizophrenia   . Hernia   . Thyroid disease   . High cholesterol   . HOH (hard of hearing)     Past Surgical History  Procedure Laterality Date  . Leg surgery    . Testicle surgery      No family history on file.  Social History:  reports that he has quit smoking. He does not have any smokeless tobacco history on file. He reports that he does not drink alcohol or use illicit drugs.  No Known Allergies  Medications:  Scheduled: . antiseptic oral rinse  7 mL Mouth Rinse QID  . cefoTEtan (CEFOTAN) IV  2 g Intravenous Q12H  . chlorhexidine gluconate  15 mL Mouth Rinse BID  . enoxaparin (LOVENOX) injection  30 mg Subcutaneous Q24H  . levETIRAcetam  500 mg Intravenous Q12H  . levothyroxine  50 mcg Intravenous Daily  . pantoprazole (PROTONIX) IV  40 mg Intravenous Q24H    ROS: Out of a complete 14 system review, the patient complains of only the following symptoms, and all other reviewed systems are negative. + seizure  Physical Examination: Filed Vitals:   09/01/14 2358  BP:   Pulse: 146  Temp:   Resp: 26   Physical Exam  Constitutional: He appears well-developed and well-nourished.  Psych: Affect appropriate to situation Eyes: No scleral injection HENT: No OP obstrucion Head: Normocephalic.  Cardiovascular: Normal rate and regular rhythm.  Respiratory: Effort normal and breath sounds normal.  GI: Soft. Bowel sounds are normal. No distension. There is no  tenderness.  Skin: WDI  Neurologic Examination Mental Status: Eyes closed, non-verbal, not following commands.  Cranial Nerves: II: optic discs not visualized, no blink to threat noted, pupils equal, round, reactive to light  III,IV, VI: ptosis not present, eyes midline, no gaze deviation, dolls eye + V,VII: face symmetric, corneal reflex positive IX,X: gag reflex present XI: unable to test XII: unable to test Motor: No spontaneous movement noted Tone and bulk:normal tone throughout; no atrophy noted Sensory: no withdrawal to noxious stimuli Deep Tendon Reflexes: 2+ and symmetric throughout Plantars: Right: downgoing   Left: downgoing Cerebellar: Unable to test Gait: unable to test  Laboratory Studies:   Basic Metabolic Panel:  Recent Labs Lab 08/31/14 2250 09/01/14 0450 09/01/14 0900 09/01/14 1625  NA 135 139 139 141  K 5.4* 3.8 4.2 3.8  CL 104  --  115* 117*  CO2 19*  --  19* 17*  GLUCOSE 123*  --  102* 119*  BUN 39*  --  36* 30*  CREATININE 2.01*  --  1.70* 1.38*  CALCIUM 8.8*  --  7.0* 7.1*  MG  --   --   --  1.4*  PHOS  --   --   --  2.9    Liver Function Tests: No results for input(s): AST, ALT, ALKPHOS, BILITOT, PROT, ALBUMIN in the last 168 hours. No results for input(s): LIPASE, AMYLASE in the last 168 hours. No results for input(s): AMMONIA in the last 168  hours.  CBC:  Recent Labs Lab 08/31/14 2250 09/01/14 0450 09/01/14 0900 09/01/14 1625  WBC 17.4*  --  10.1 11.4*  NEUTROABS 14.8*  --   --  9.7*  HGB 13.4 10.2* 9.8* 10.3*  HCT 40.7 30.0* 29.7* 31.0*  MCV 90.6  --  87.6 87.1  PLT 104*  --  80* 93*    Cardiac Enzymes:  Recent Labs Lab 09/01/14 1954  TROPONINI 0.06*    BNP: Invalid input(s): POCBNP  CBG:  Recent Labs Lab 09/01/14 0627 09/01/14 1200 09/01/14 1240 09/01/14 1530 09/01/14 1940  GLUCAP 87 60* 91 103* 105*    Microbiology: Results for orders placed or performed during the hospital encounter of 04/11/13   Blood culture (routine x 2)     Status: None   Collection Time: 04/11/13  4:25 PM  Result Value Ref Range Status   Specimen Description LEFT ANTECUBITAL  Final   Special Requests  BAA 6 CC EACH  Final   Culture NO GROWTH 5 DAYS  Final   Report Status 04/16/2013 FINAL  Final  Blood culture (routine x 2)     Status: None   Collection Time: 04/11/13  4:25 PM  Result Value Ref Range Status   Specimen Description BLOOD RIGHT ARM  Final   Special Requests AEB 4 CC, ANA 2 CC  Final   Culture NO GROWTH 5 DAYS  Final   Report Status 04/16/2013 FINAL  Final  MRSA PCR Screening     Status: None   Collection Time: 04/12/13 12:30 AM  Result Value Ref Range Status   MRSA by PCR NEGATIVE NEGATIVE Final    Comment:        The GeneXpert MRSA Assay (FDA approved for NASAL specimens only), is one component of a comprehensive MRSA colonization surveillance program. It is not intended to diagnose MRSA infection nor to guide or monitor treatment for MRSA infections.    Coagulation Studies: No results for input(s): LABPROT, INR in the last 72 hours.  Urinalysis:  Recent Labs Lab 08/31/14 2239  COLORURINE YELLOW  LABSPEC 1.020  PHURINE 5.5  GLUCOSEU NEGATIVE  HGBUR TRACE*  BILIRUBINUR NEGATIVE  KETONESUR TRACE*  PROTEINUR NEGATIVE  UROBILINOGEN 0.2  NITRITE NEGATIVE  LEUKOCYTESUR NEGATIVE    Lipid Panel:     Component Value Date/Time   CHOL 95 04/12/2013 0535   TRIG 40 09/01/2014 0900   HDL 37* 04/12/2013 0535   CHOLHDL 2.6 04/12/2013 0535   VLDL 10 04/12/2013 0535   LDLCALC 48 04/12/2013 0535    HgbA1C:  Lab Results  Component Value Date   HGBA1C 5.6 04/12/2013    Urine Drug Screen:     Component Value Date/Time   LABOPIA NONE DETECTED 04/11/2013 1502   COCAINSCRNUR NONE DETECTED 04/11/2013 1502   LABBENZ NONE DETECTED 04/11/2013 1502   AMPHETMU NONE DETECTED 04/11/2013 1502   THCU NONE DETECTED 04/11/2013 1502   LABBARB NONE DETECTED 04/11/2013 1502    Alcohol  Level: No results for input(s): ETH in the last 168 hours.  Other results:  Imaging: Ct Abdomen Pelvis Wo Contrast  09/01/2014   CLINICAL DATA:  Nausea, vomiting, lower abdominal and genital pain. Genital swelling. History of thyroid disease and hernia.  EXAM: CT ABDOMEN AND PELVIS WITHOUT CONTRAST  TECHNIQUE: Multidetector CT imaging of the abdomen and pelvis was performed following the standard protocol without IV contrast.  COMPARISON:  05/01/2013  FINDINGS: Motion artifact limits evaluation. Suggestion of infiltration in the left lung base possibly indicating pneumonia or  motion artifact. Distal esophageal wall appears thickened and residual contrast material is present in the esophagus. This may be due to reflux disease.  There is diffuse portal venous gas. Gas is demonstrated with in the left upper quadrant and epigastric veins. The configuration of the stomach is abnormal with the body of the stomach towards the left and the greater curvature towards the right. Proximal jejunal loops are in the right upper quadrant. This suggests an internal gastric volvulus. The presence of portal venous gas suggests gastric ischemia. Possible mild pneumatosis in the pyloric region of the stomach. Proximal jejunum in the right upper quadrant are mildly dilated suggesting some degree of obstruction. Findings may indicated internal hernia such as a paraduodenal hernia. Spleen size is normal. Gallbladder is distended. No stones or wall thickening identified. Unenhanced appearance of the pancreas and adrenals is unremarkable. Mild bilateral hydronephrosis. No ureteral stones identified. No free fluid in the abdomen. Diffusely stool-filled colon.  Pelvis: There is a large left inguinal hernia containing sigmoid colon and small bowel. There is edema within the hernia. Vascular compromise of bowel within the hernia is not excluded but the hernia appears similar to prior study. Prominent stool-filled rectosigmoid colon  suggesting impaction. No free fluid in the abdomen. Bladder is decompressed. Degenerative changes in the spine.  IMPRESSION: 1. 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 Portal venous gas suggests bowel ischemia.  2. Large left inguinal hernia containing sigmoid colon and small bowel. Edema within the hernia sac. Mild bowel wall thickening. Vascular compromise within the herniated bowel is not excluded.  3. Diffusely stool-filled colon with prominent stool-filled rectosigmoid colon. Possible pneumonia in the left lung base.  These results were called by telephone at the time of interpretation on 09/01/2014 at 12:50 am to Dr. Dina Rich, who verbally acknowledged these results.   Electronically Signed   By: Lucienne Capers M.D.   On: 09/01/2014 00:53   Dg Chest Port 1 View  09/01/2014   CLINICAL DATA:  Central line placement  EXAM: PORTABLE CHEST - 1 VIEW  COMPARISON:  700 hours  FINDINGS: Right internal jugular central venous catheter has been placed. Tip is at the cavoatrial junction. There is no pneumothorax. Endotracheal and NG tubes are stable. Stable appearance of the heart and lungs.  IMPRESSION: Right internal jugular central venous catheter placement with its tip at the cavoatrial junction and no pneumothorax.   Electronically Signed   By: Marybelle Killings M.D.   On: 09/01/2014 09:22   Dg Chest Port 1 View  09/01/2014   CLINICAL DATA:  Intubated.  EXAM: PORTABLE CHEST - 1 VIEW  COMPARISON:  04/23/2013 and chest CT dated 05/01/2013.  FINDINGS: Normal sized heart. Endotracheal tube in satisfactory position. Nasogastric tube tip in the proximal stomach. Clear lungs. Minimally prominent interstitial markings. Diffuse osteopenia.  IMPRESSION: 1. Endotracheal tube in satisfactory position. 2. Minimal chronic interstitial lung disease.   Electronically Signed   By: Claudie Revering M.D.   On: 09/01/2014 08:19      Assessment/Plan:  59y/o gentleman history of schizophrenia admitted with strangulated hernia s/p colectomy and colostomy, hospital course complicated by respiratory failure, septic shock and AKI. Now with new onset seizure.   -agree with keppra 500mg  BID -CT head -EEG -will follow  Jim Like, DO [facility] [phone]  If 7pm- 7am, please page neurology on call as listed in [facility]. 09/02/2014, 12:17 AM

## 2014-09-02 NOTE — Consult Note (Signed)
[facility] Consult   Reason for Consult:  Schizophrenia medication management Referring Physician:  Dr. Chase Caller Patient Identification: Steven David MRN:  [id number] Principal Diagnosis: Schizophrenia Diagnosis:   Patient Active Problem List   Diagnosis Date Noted  . Seizure [R56.9]   . Incarcerated inguinal hernia [K40.30] 09/01/2014  . Respiratory failure, acute [J96.00] 09/01/2014  . Metabolic acidosis [J57.0] 09/01/2014  . Incarcerated hernia [K46.0] 09/01/2014  . Septic shock [A41.9, R65.21] 09/01/2014  . AKI (acute kidney injury) [N17.9]   . Protein-calorie malnutrition, severe [E43] 04/13/2013  . Sepsis [A41.9] 04/11/2013  . H/O stroke within last year [Z86.73] 04/11/2013  . Hypothyroidism [E03.9] 04/11/2013  . Pneumonia [J18.9] 04/11/2013  . Thrombocytopenia [D69.6] 04/11/2013  . Schizophrenia [F20.9] 04/11/2013  . Malnutrition [E46] 04/11/2013  . SIRS (systemic inflammatory response syndrome) [A41.9] 04/11/2013    Total Time spent with patient: 45 minutes  Subjective:   Steven David is a 59 y.o. male patient admitted with strangulated left inguinal hernia and status post intubation.  HPI: Steven David is a 59 yo male, resident of group home member seen for the face to face psychiatric consultation and evaluation of schizophrenia and medication management. Patient is currently NPO. Patient is a poor historian and difficult to understand 50 % of language due to dysphasia.  He appeared staying in his bed, awake, alert and poorly oriented other than his name. Patient has no irritability, agitation or aggression at this time. He had one episode of seizure and neurologist cleared at this time due to no findings on his EEG. Patient home medication for psychosis was on hold due to NPO status. He is taking valproic acid and will start Geodon/Haldol IM PRN for possible agitation and aggression. He may start his oral medication as soon as NPO status changes  from Surgery. He is currently denied depression, anxiety and psychosis, SI/HI.   HPI Elements:   Location:  Schizophrenia. Quality:  poor. Severity:  status post surgery and intubation. Timing:  inguinal hernia repair. Duration:  few days. Context:  resident of group home.  Past Medical History:  Past Medical History  Diagnosis Date  . Schizophrenia   . Hernia   . Thyroid disease   . High cholesterol   . HOH (hard of hearing)     Past Surgical History  Procedure Laterality Date  . Leg surgery    . Testicle surgery     Family History: No family history on file. Social History:  History  Alcohol Use No     History  Drug Use No    Social History   Social History  . Marital Status: Single    Spouse Name: N/A  . Number of Children: N/A  . Years of Education: N/A   Social History Main Topics  . Smoking status: Former Research scientist (life sciences)  . Smokeless tobacco: None  . Alcohol Use: No  . Drug Use: No  . Sexual Activity: Not Asked   Other Topics Concern  . None   Social History Narrative   Additional Social History:                          Allergies:  No Known Allergies  Labs:  Results for orders placed or performed during the hospital encounter of 08/31/14 (from the past 48 hour(s))  Urinalysis, Routine w reflex microscopic (not at [facility])     Status: Abnormal   Collection Time: 08/31/14 10:39 PM  Result Value Ref Range  Color, Urine YELLOW YELLOW   APPearance CLEAR CLEAR   Specific Gravity, Urine 1.020 1.005 - 1.030   pH 5.5 5.0 - 8.0   Glucose, UA NEGATIVE NEGATIVE mg/dL   Hgb urine dipstick TRACE (A) NEGATIVE   Bilirubin Urine NEGATIVE NEGATIVE   Ketones, ur TRACE (A) NEGATIVE mg/dL   Protein, ur NEGATIVE NEGATIVE mg/dL   Urobilinogen, UA 0.2 0.0 - 1.0 mg/dL   Nitrite NEGATIVE NEGATIVE   Leukocytes, UA NEGATIVE NEGATIVE  Urine microscopic-add on     Status: Abnormal   Collection Time: 08/31/14 10:39 PM  Result Value Ref Range   Squamous Epithelial /  LPF FEW (A) RARE   WBC, UA 0-2 <3 WBC/hpf   RBC / HPF 0-2 <3 RBC/hpf   Bacteria, UA FEW (A) RARE  Basic metabolic panel     Status: Abnormal   Collection Time: 08/31/14 10:50 PM  Result Value Ref Range   Sodium 135 135 - 145 mmol/L   Potassium 5.4 (H) 3.5 - 5.1 mmol/L   Chloride 104 101 - 111 mmol/L   CO2 19 (L) 22 - 32 mmol/L   Glucose, Bld 123 (H) 65 - 99 mg/dL   BUN 39 (H) 6 - 20 mg/dL   Creatinine, Ser 2.01 (H) 0.61 - 1.24 mg/dL   Calcium 8.8 (L) 8.9 - 10.3 mg/dL   GFR calc non Af Amer 35 (L) >60 mL/min   GFR calc Af Amer 41 (L) >60 mL/min    Comment: (NOTE) The eGFR has been calculated using the CKD EPI equation. This calculation has not been validated in all clinical situations. eGFR's persistently <60 mL/min signify possible Chronic Kidney Disease.    Anion gap 12 5 - 15  CBC with Differential     Status: Abnormal   Collection Time: 08/31/14 10:50 PM  Result Value Ref Range   WBC 17.4 (H) 4.0 - 10.5 K/uL    Comment: REPEATED TO VERIFY   RBC 4.49 4.22 - 5.81 MIL/uL   Hemoglobin 13.4 13.0 - 17.0 g/dL   HCT 40.7 39.0 - 52.0 %   MCV 90.6 78.0 - 100.0 fL   MCH 29.8 26.0 - 34.0 pg   MCHC 32.9 30.0 - 36.0 g/dL   RDW 14.5 11.5 - 15.5 %   Platelets 104 (L) 150 - 400 K/uL    Comment: REPEATED TO VERIFY SPECIMEN CHECKED FOR CLOTS    Neutrophils Relative % 85 (H) 43 - 77 %   Neutro Abs 14.8 (H) 1.7 - 7.7 K/uL   Lymphocytes Relative 5 (L) 12 - 46 %   Lymphs Abs 0.9 0.7 - 4.0 K/uL   Monocytes Relative 10 3 - 12 %   Monocytes Absolute 1.7 (H) 0.1 - 1.0 K/uL   Eosinophils Relative 0 0 - 5 %   Eosinophils Absolute 0.0 0.0 - 0.7 K/uL   Basophils Relative 0 0 - 1 %   Basophils Absolute 0.0 0.0 - 0.1 K/uL   WBC Morphology VACUOLATED NEUTROPHILS     Comment: ATYPICAL LYMPHOCYTES MILD LEFT SHIFT (1-5% METAS, OCC MYELO, OCC BANDS)    RBC Morphology TEARDROP CELLS    Smear Review PLATELETS APPEAR DECREASED     Comment: LARGE PLATELETS PRESENT  Lactic acid, plasma     Status:  Abnormal   Collection Time: 09/01/14 12:59 AM  Result Value Ref Range   Lactic Acid, Venous 4.7 (HH) 0.5 - 2.0 mmol/L    Comment: CRITICAL RESULT CALLED TO, READ BACK BY AND VERIFIED WITH: SHORE L AT 0150  ON [id number] BY FORSYTH K   I-STAT 7, (LYTES, BLD GAS, ICA, H+H)     Status: Abnormal   Collection Time: 09/01/14  4:50 AM  Result Value Ref Range   pH, Arterial 7.311 (L) 7.350 - 7.450   pCO2 arterial 35.4 35.0 - 45.0 mmHg   pO2, Arterial 325.0 (H) 80.0 - 100.0 mmHg   Bicarbonate 17.9 (L) 20.0 - 24.0 mEq/L   TCO2 19 0 - 100 mmol/L   O2 Saturation 100.0 %   Acid-base deficit 8.0 (H) 0.0 - 2.0 mmol/L   Sodium 139 135 - 145 mmol/L   Potassium 3.8 3.5 - 5.1 mmol/L   Calcium, Ion 1.07 (L) 1.12 - 1.23 mmol/L   HCT 30.0 (L) 39.0 - 52.0 %   Hemoglobin 10.2 (L) 13.0 - 17.0 g/dL   Sample type ARTERIAL   Glucose, capillary     Status: None   Collection Time: 09/01/14  6:27 AM  Result Value Ref Range   Glucose-Capillary 87 65 - 99 mg/dL  Glucose, capillary     Status: Abnormal   Collection Time: 09/01/14  8:02 AM  Result Value Ref Range   Glucose-Capillary 101 (H) 65 - 99 mg/dL  CBC     Status: Abnormal   Collection Time: 09/01/14  9:00 AM  Result Value Ref Range   WBC 10.1 4.0 - 10.5 K/uL    Comment: REPEATED TO VERIFY   RBC 3.39 (L) 4.22 - 5.81 MIL/uL   Hemoglobin 9.8 (L) 13.0 - 17.0 g/dL    Comment: REPEATED TO VERIFY   HCT 29.7 (L) 39.0 - 52.0 %   MCV 87.6 78.0 - 100.0 fL   MCH 28.9 26.0 - 34.0 pg   MCHC 33.0 30.0 - 36.0 g/dL   RDW 14.6 11.5 - 15.5 %   Platelets 80 (L) 150 - 400 K/uL    Comment: PLATELET COUNT CONFIRMED BY SMEAR  Basic metabolic panel     Status: Abnormal   Collection Time: 09/01/14  9:00 AM  Result Value Ref Range   Sodium 139 135 - 145 mmol/L   Potassium 4.2 3.5 - 5.1 mmol/L   Chloride 115 (H) 101 - 111 mmol/L   CO2 19 (L) 22 - 32 mmol/L   Glucose, Bld 102 (H) 65 - 99 mg/dL   BUN 36 (H) 6 - 20 mg/dL   Creatinine, Ser 1.70 (H) 0.61 - 1.24 mg/dL    Calcium 7.0 (L) 8.9 - 10.3 mg/dL   GFR calc non Af Amer 43 (L) >60 mL/min   GFR calc Af Amer 50 (L) >60 mL/min    Comment: (NOTE) The eGFR has been calculated using the CKD EPI equation. This calculation has not been validated in all clinical situations. eGFR's persistently <60 mL/min signify possible Chronic Kidney Disease.    Anion gap 5 5 - 15  Triglycerides     Status: None   Collection Time: 09/01/14  9:00 AM  Result Value Ref Range   Triglycerides 40 <150 mg/dL  Procalcitonin - Baseline     Status: None   Collection Time: 09/01/14  9:00 AM  Result Value Ref Range   Procalcitonin 33.48 ng/mL    Comment:        Interpretation: PCT >= 10 ng/mL: Important systemic inflammatory response, almost exclusively due to severe bacterial sepsis or septic shock. (NOTE)         ICU PCT Algorithm               Non ICU PCT Algorithm    ----------------------------     ------------------------------  PCT < 0.25 ng/mL                 PCT < 0.1 ng/mL     Stopping of antibiotics            Stopping of antibiotics       strongly encouraged.               strongly encouraged.    ----------------------------     ------------------------------       PCT level decrease by               PCT < 0.25 ng/mL       >= 80% from peak PCT       OR PCT 0.25 - 0.5 ng/mL          Stopping of antibiotics                                             encouraged.     Stopping of antibiotics           encouraged.    ----------------------------     ------------------------------       PCT level decrease by              PCT >= 0.25 ng/mL       < 80% from peak PCT        AND PCT >= 0.5 ng/mL             Continuing antibiotics                                              encouraged.       Continuing antibiotics            encouraged.    ----------------------------     ------------------------------     PCT level increase compared          PCT > 0.5 ng/mL         with peak PCT AND          PCT >= 0.5  ng/mL             Escalation of antibiotics                                          strongly encouraged.      Escalation of antibiotics        strongly encouraged.   Lithium level     Status: Abnormal   Collection Time: 09/01/14  9:00 AM  Result Value Ref Range   Lithium Lvl 0.56 (L) 0.60 - 1.20 mmol/L  Lactic acid, plasma     Status: None   Collection Time: 09/01/14 11:25 AM  Result Value Ref Range   Lactic Acid, Venous 1.1 0.5 - 2.0 mmol/L  Glucose, capillary     Status: Abnormal   Collection Time: 09/01/14 12:00 PM  Result Value Ref Range   Glucose-Capillary 60 (L) 65 - 99 mg/dL   Comment 1 Notify RN    Comment 2 Document in Chart   Glucose, capillary     Status: None   Collection Time: 09/01/14 12:40 PM  Result Value Ref Range   Glucose-Capillary 91 65 - 99 mg/dL  Carboxyhemoglobin     Status: Abnormal   Collection Time: 09/01/14 12:55 PM  Result Value Ref Range   Total hemoglobin 10.4 (L) 13.5 - 18.0 g/dL   O2 Saturation 92.3 %   Carboxyhemoglobin 1.0 0.5 - 1.5 %   Methemoglobin 1.5 0.0 - 1.5 %  Glucose, capillary     Status: Abnormal   Collection Time: 09/01/14  3:30 PM  Result Value Ref Range   Glucose-Capillary 103 (H) 65 - 99 mg/dL  CBC with Differential/Platelet     Status: Abnormal   Collection Time: 09/01/14  4:25 PM  Result Value Ref Range   WBC 11.4 (H) 4.0 - 10.5 K/uL   RBC 3.56 (L) 4.22 - 5.81 MIL/uL   Hemoglobin 10.3 (L) 13.0 - 17.0 g/dL   HCT 31.0 (L) 39.0 - 52.0 %   MCV 87.1 78.0 - 100.0 fL   MCH 28.9 26.0 - 34.0 pg   MCHC 33.2 30.0 - 36.0 g/dL   RDW 14.7 11.5 - 15.5 %   Platelets 93 (L) 150 - 400 K/uL    Comment: CONSISTENT WITH PREVIOUS RESULT   Neutrophils Relative % 7 (L) 43 - 77 %   Lymphocytes Relative 9 (L) 12 - 46 %   Monocytes Relative 5 3 - 12 %   Eosinophils Relative 0 0 - 5 %   Basophils Relative 0 0 - 1 %   Band Neutrophils 58 (H) 0 - 10 %   Metamyelocytes Relative 17 %   Myelocytes 3 %   Promyelocytes Absolute 0 %   Blasts 0 %    nRBC 0 0 /100 WBC   Other 1 %   Neutro Abs 9.7 (H) 1.7 - 7.7 K/uL   Lymphs Abs 1.0 0.7 - 4.0 K/uL   Monocytes Absolute 0.6 0.1 - 1.0 K/uL   Eosinophils Absolute 0.0 0.0 - 0.7 K/uL   Basophils Absolute 0.0 0.0 - 0.1 K/uL   WBC Morphology INCREASED BANDS (>20% BANDS)     Comment: MODERATE LEFT SHIFT (>5% METAS AND MYELOS,OCC PRO NOTED)  Basic metabolic panel     Status: Abnormal   Collection Time: 09/01/14  4:25 PM  Result Value Ref Range   Sodium 141 135 - 145 mmol/L   Potassium 3.8 3.5 - 5.1 mmol/L   Chloride 117 (H) 101 - 111 mmol/L   CO2 17 (L) 22 - 32 mmol/L   Glucose, Bld 119 (H) 65 - 99 mg/dL   BUN 30 (H) 6 - 20 mg/dL   Creatinine, Ser 1.38 (H) 0.61 - 1.24 mg/dL   Calcium 7.1 (L) 8.9 - 10.3 mg/dL   GFR calc non Af Amer 55 (L) >60 mL/min   GFR calc Af Amer >60 >60 mL/min    Comment: (NOTE) The eGFR has been calculated using the CKD EPI equation. This calculation has not been validated in all clinical situations. eGFR's persistently <60 mL/min signify possible Chronic Kidney Disease.    Anion gap 7 5 - 15  Magnesium     Status: Abnormal   Collection Time: 09/01/14  4:25 PM  Result Value Ref Range   Magnesium 1.4 (L) 1.7 - 2.4 mg/dL  Phosphorus     Status: None   Collection Time: 09/01/14  4:25 PM  Result Value Ref Range   Phosphorus 2.9 2.5 - 4.6 mg/dL  Glucose, capillary     Status: Abnormal   Collection Time: 09/01/14  7:40 PM  Result  Value Ref Range   Glucose-Capillary 105 (H) 65 - 99 mg/dL  Troponin I     Status: Abnormal   Collection Time: 09/01/14  7:54 PM  Result Value Ref Range   Troponin I 0.06 (H) <0.031 ng/mL    Comment:        PERSISTENTLY INCREASED TROPONIN VALUES IN THE RANGE OF 0.04-0.49 ng/mL CAN BE SEEN IN:       -UNSTABLE ANGINA       -CONGESTIVE HEART FAILURE       -MYOCARDITIS       -CHEST TRAUMA       -ARRYHTHMIAS       -LATE PRESENTING MYOCARDIAL INFARCTION       -COPD   CLINICAL FOLLOW-UP RECOMMENDED.   Glucose, capillary      Status: None   Collection Time: 09/01/14 11:45 PM  Result Value Ref Range   Glucose-Capillary 94 65 - 99 mg/dL  CBC     Status: Abnormal   Collection Time: 09/02/14 12:21 AM  Result Value Ref Range   WBC 11.7 (H) 4.0 - 10.5 K/uL   RBC 3.36 (L) 4.22 - 5.81 MIL/uL   Hemoglobin 10.0 (L) 13.0 - 17.0 g/dL   HCT 29.7 (L) 39.0 - 52.0 %   MCV 88.4 78.0 - 100.0 fL   MCH 29.8 26.0 - 34.0 pg   MCHC 33.7 30.0 - 36.0 g/dL   RDW 15.0 11.5 - 15.5 %   Platelets 86 (L) 150 - 400 K/uL    Comment: CONSISTENT WITH PREVIOUS RESULT  Basic metabolic panel     Status: Abnormal   Collection Time: 09/02/14 12:21 AM  Result Value Ref Range   Sodium 141 135 - 145 mmol/L   Potassium 3.8 3.5 - 5.1 mmol/L   Chloride 116 (H) 101 - 111 mmol/L   CO2 17 (L) 22 - 32 mmol/L   Glucose, Bld 101 (H) 65 - 99 mg/dL   BUN 26 (H) 6 - 20 mg/dL   Creatinine, Ser 1.39 (H) 0.61 - 1.24 mg/dL   Calcium 7.3 (L) 8.9 - 10.3 mg/dL   GFR calc non Af Amer 55 (L) >60 mL/min   GFR calc Af Amer >60 >60 mL/min    Comment: (NOTE) The eGFR has been calculated using the CKD EPI equation. This calculation has not been validated in all clinical situations. eGFR's persistently <60 mL/min signify possible Chronic Kidney Disease.    Anion gap 8 5 - 15  Glucose, capillary     Status: None   Collection Time: 09/02/14  4:28 AM  Result Value Ref Range   Glucose-Capillary 88 65 - 99 mg/dL  Procalcitonin     Status: None   Collection Time: 09/02/14  4:46 AM  Result Value Ref Range   Procalcitonin 26.69 ng/mL    Comment:        Interpretation: PCT >= 10 ng/mL: Important systemic inflammatory response, almost exclusively due to severe bacterial sepsis or septic shock. (NOTE)         ICU PCT Algorithm               Non ICU PCT Algorithm    ----------------------------     ------------------------------         PCT < 0.25 ng/mL                 PCT < 0.1 ng/mL     Stopping of antibiotics            Stopping of antibiotics  strongly  encouraged.               strongly encouraged.    ----------------------------     ------------------------------       PCT level decrease by               PCT < 0.25 ng/mL       >= 80% from peak PCT       OR PCT 0.25 - 0.5 ng/mL          Stopping of antibiotics                                             encouraged.     Stopping of antibiotics           encouraged.    ----------------------------     ------------------------------       PCT level decrease by              PCT >= 0.25 ng/mL       < 80% from peak PCT        AND PCT >= 0.5 ng/mL             Continuing antibiotics                                              encouraged.       Continuing antibiotics            encouraged.    ----------------------------     ------------------------------     PCT level increase compared          PCT > 0.5 ng/mL         with peak PCT AND          PCT >= 0.5 ng/mL             Escalation of antibiotics                                          strongly encouraged.      Escalation of antibiotics        strongly encouraged.   Magnesium     Status: None   Collection Time: 09/02/14  4:46 AM  Result Value Ref Range   Magnesium 2.0 1.7 - 2.4 mg/dL  Phosphorus     Status: None   Collection Time: 09/02/14  4:46 AM  Result Value Ref Range   Phosphorus 2.7 2.5 - 4.6 mg/dL  Hepatic function panel     Status: Abnormal   Collection Time: 09/02/14  4:46 AM  Result Value Ref Range   Total Protein 4.6 (L) 6.5 - 8.1 g/dL   Albumin 2.2 (L) 3.5 - 5.0 g/dL   AST 34 15 - 41 U/L   ALT 18 17 - 63 U/L   Alkaline Phosphatase 36 (L) 38 - 126 U/L   Total Bilirubin 1.2 0.3 - 1.2 mg/dL   Bilirubin, Direct 0.3 0.1 - 0.5 mg/dL   Indirect Bilirubin 0.9 0.3 - 0.9 mg/dL  Albumin     Status: Abnormal   Collection Time: 09/02/14  9:30 AM  Result Value Ref Range  Albumin 2.3 (L) 3.5 - 5.0 g/dL    Vitals: Blood pressure 111/84, pulse 114, temperature 98.3 F (36.8 C), temperature source Oral, resp. rate 16,  height 5' 6"  (1.676 m), weight 58.4 kg (128 lb 12 oz), SpO2 100 %.  Risk to Self: Is patient at risk for suicide?: No Risk to Others:   Prior Inpatient Therapy:   Prior Outpatient Therapy:    Current Facility-Administered Medications  Medication Dose Route Frequency Provider Last Rate Last Dose  . 0.9 %  sodium chloride infusion   Intravenous Continuous Rolm Bookbinder, MD 125 mL/hr at 09/01/14 [phone]    . antiseptic oral rinse (CPC / CETYLPYRIDINIUM CHLORIDE 0.05%) solution 7 mL  7 mL Mouth Rinse q12n4p Brand Males, MD      . antiseptic oral rinse solution (CORINZ)  7 mL Mouth Rinse QID Saverio Danker, PA-C   7 mL at 09/02/14 0403  . cefTRIAXone (ROCEPHIN) 2 g in dextrose 5 % 50 mL IVPB  2 g Intravenous Q24H Saverio Danker, PA-C   2 g at 09/02/14 1191  . chlorhexidine (PERIDEX) 0.12 % solution 15 mL  15 mL Mouth Rinse BID Brand Males, MD      . chlorhexidine gluconate (PERIDEX) 0.12 % solution 15 mL  15 mL Mouth Rinse BID Saverio Danker, PA-C   15 mL at 09/02/14 0743  . fentaNYL (SUBLIMAZE) injection 50-200 mcg  50-200 mcg Intravenous Q2H PRN Brand Males, MD   100 mcg at 09/02/14 0615  . levothyroxine (SYNTHROID, LEVOTHROID) injection 50 mcg  50 mcg Intravenous Daily Grace Bushy Minor, NP   50 mcg at 09/02/14 0925  . midazolam (VERSED) injection 2 mg  2 mg Intravenous Q15 min PRN Brand Males, MD   2 mg at 09/02/14 0243  . midazolam (VERSED) injection 2 mg  2 mg Intravenous Q2H PRN Brand Males, MD   2 mg at 09/01/14 2141  . ondansetron (ZOFRAN-ODT) disintegrating tablet 4 mg  4 mg Oral Q6H PRN Rolm Bookbinder, MD       Or  . ondansetron [facility]) injection 4 mg  4 mg Intravenous Q6H PRN Rolm Bookbinder, MD      . pantoprazole (PROTONIX) injection 40 mg  40 mg Intravenous Q24H Anders Simmonds, MD   40 mg at 09/02/14 4782  . silver nitrate applicators applicator 1 application  1 application Topical PRN Saverio Danker, PA-C      . valproate (DEPACON) 500 mg in dextrose 5 %  50 mL IVPB  500 mg Intravenous 3 times per day Greta Doom, MD        Musculoskeletal: Strength & Muscle Tone: decreased Gait & Station: unable to stand Patient leans: N/A  Psychiatric Specialty Exam: Physical Exam  Vitals reviewed.   ROS denied nausea, vomiting and sob and chest pain.  Blood pressure 111/84, pulse 114, temperature 98.3 F (36.8 C), temperature source Oral, resp. rate 16, height 5' 6"  (1.676 m), weight 58.4 kg (128 lb 12 oz), SpO2 100 %.Body mass index is 20.79 kg/(m^2).  General Appearance: Disheveled and Guarded  Eye Contact::  Good  Speech:  Garbled and Slow  Volume:  Decreased  Mood:  Depressed  Affect:  Constricted and Depressed  Thought Process:  Disorganized and Irrelevant  Orientation:  Full (Time, Place, and Person)  Thought Content:  WDL  Suicidal Thoughts:  No  Homicidal Thoughts:  No  Memory:  Immediate;   Fair Recent;   Poor  Judgement:  Impaired  Insight:  Lacking  Psychomotor Activity:  Decreased  Concentration:  Fair  Recall:  Poor  Fund of Knowledge:Fair  Language: Fair  Akathisia:  Negative  Handed:  Right  AIMS (if indicated):     Assets:  Communication Skills Desire for Improvement Financial Resources/Insurance Housing Leisure Time Resilience Social Support  ADL's:  Impaired  Cognition: Impaired,  Mild  Sleep:      Medical Decision Making: New problem, with additional work up planned, Review of Psycho-Social Stressors (1), Review or order clinical lab tests (1), Review of Last Therapy Session (1), Review or order medicine tests (1), Review of Medication Regimen & Side Effects (2) and Review of New Medication or Change in Dosage (2)  Treatment Plan Summary: Daily contact with patient to assess and evaluate symptoms and progress in treatment and Medication management  Plan:  Case discussed with LCSW and will obtain collateral information from group home and [facility]. Will start Geodon 10 mg IM Q6H/PRN for agitation  and aggression Continue Depacone 500 mg Q8H as scheduled and check valproic acid level May start home medication haldol 10 mg daily when his NPO status changed Check with [facility] regading his haldol depot 100 mg next due date.   Patient does not meet criteria for psychiatric inpatient admission. Supportive therapy provided about ongoing stressors.  Appreciate psychiatric consultation Please contact [phone] or [phone] if needs further assistance   Disposition: May discharge to group home when medically and surgically stable for out patient psychiatric services at [facility] recovery.   Everett Ehrler,JANARDHAHA R. 09/02/2014 11:46 AM Is as significant joint disease actually abnormal

## 2014-09-02 NOTE — Procedures (Signed)
Extubation Procedure Note  Patient Details:   Name: Steven David DOB: May 26, 1955 MRN: [id number]   Airway Documentation:     Evaluation  O2 sats: stable throughout Complications: No apparent complications Patient did tolerate procedure well. Bilateral Breath Sounds: Rhonchi Suctioning: Airway Yes  Pt extubated and placed on 4L [place] tolerating well, no distress noted, pt able to voice without problem   Ciro Backer 09/02/2014, 10:44 AM

## 2014-09-02 NOTE — Progress Notes (Addendum)
[facility] Progress Note Patient Name: Steven David DOB: 1955-04-04 MRN: [id number]   Date of Service  09/02/2014  HPI/Events of Note  New onset generalized tonic clonic seizure witness by bedside nurse. Duration 2 to 3 minutes. Broke completely with Versed 2 mg IV.   eICU Interventions  Will order: 1. Keppra 1000 mg IV now - load, then 500 mg IV Q 12 hours. 2. Consult Neurology - I have personally spoken to Dr. Janann Colonel, Neurology on call, to evaluate and help manage the patient.      Intervention Category Major Interventions: Seizures - evaluation and management  Steven David 09/02/2014, 12:09 AM

## 2014-09-02 NOTE — Progress Notes (Addendum)
Wide QRS complex noted on bedside monitor. From rate of 70s to 140s. Elink MD ordered EKG and troponin at this time. EKG completed. Will continue to monitor this patient closely.

## 2014-09-03 ENCOUNTER — Inpatient Hospital Stay (HOSPITAL_COMMUNITY): Payer: Medicare Other

## 2014-09-03 LAB — BASIC METABOLIC PANEL
ANION GAP: 6 (ref 5–15)
Anion gap: 8 (ref 5–15)
BUN: 16 mg/dL (ref 6–20)
BUN: 12 mg/dL (ref 6–20)
CHLORIDE: 128 mmol/L — AB (ref 101–111)
CHLORIDE: 128 mmol/L — AB (ref 101–111)
CO2: 21 mmol/L — AB (ref 22–32)
CO2: 23 mmol/L (ref 22–32)
CREATININE: 0.97 mg/dL (ref 0.61–1.24)
Calcium: 8.2 mg/dL — ABNORMAL LOW (ref 8.9–10.3)
Calcium: 8.2 mg/dL — ABNORMAL LOW (ref 8.9–10.3)
Creatinine, Ser: 0.83 mg/dL (ref 0.61–1.24)
GFR calc Af Amer: >60 mL/min (ref >60)
GFR calc non Af Amer: >60 mL/min (ref >60)
GFR calc non Af Amer: >60 mL/min (ref >60)
GLUCOSE: 145 mg/dL — AB (ref 65–99)
Glucose, Bld: 90 mg/dL (ref 65–99)
POTASSIUM: 3.4 mmol/L — AB (ref 3.5–5.1)
POTASSIUM: 2.9 mmol/L — AB (ref 3.5–5.1)
SODIUM: 157 mmol/L — AB (ref 135–145)
Sodium: 157 mmol/L — ABNORMAL HIGH (ref 135–145)

## 2014-09-03 LAB — CBC
HEMATOCRIT: 28.9 % — AB (ref 39.0–52.0)
HEMOGLOBIN: 9.5 g/dL — AB (ref 13.0–17.0)
MCH: 28.8 pg (ref 26.0–34.0)
MCHC: 32.9 g/dL (ref 30.0–36.0)
MCV: 87.6 fL (ref 78.0–100.0)
Platelets: 79 10*3/uL — ABNORMAL LOW (ref 150–400)
RBC: 3.3 MIL/uL — AB (ref 4.22–5.81)
RDW: 15.5 % (ref 11.5–15.5)
WBC: 10.9 10*3/uL — ABNORMAL HIGH (ref 4.0–10.5)

## 2014-09-03 LAB — MRSA PCR SCREENING: MRSA by PCR: NEGATIVE

## 2014-09-03 LAB — MAGNESIUM: MAGNESIUM: 2.1 mg/dL (ref 1.7–2.4)

## 2014-09-03 LAB — TSH: TSH: 0.955 u[IU]/mL (ref 0.350–4.500)

## 2014-09-03 LAB — PROCALCITONIN: PROCALCITONIN: 11.06 ng/mL

## 2014-09-03 LAB — PHOSPHORUS: PHOSPHORUS: 1.5 mg/dL — AB (ref 2.5–4.6)

## 2014-09-03 MED ORDER — POTASSIUM CHLORIDE 10 MEQ/50ML IV SOLN
10.0000 meq | INTRAVENOUS | Status: AC
  Administered 2014-09-03 – 2014-09-04 (×4): 10 meq via INTRAVENOUS
  Filled 2014-09-03 (×4): qty 50

## 2014-09-03 MED ORDER — LEVOTHYROXINE SODIUM 100 MCG PO TABS
100.0000 ug | ORAL_TABLET | Freq: Every day | ORAL | Status: DC
  Filled 2014-09-03 (×2): qty 1

## 2014-09-03 MED ORDER — HALOPERIDOL 5 MG PO TABS
10.0000 mg | ORAL_TABLET | Freq: Every evening | ORAL | Status: DC
  Filled 2014-09-03: qty 2

## 2014-09-03 MED ORDER — DEXTROSE 5 % IV SOLN
INTRAVENOUS | Status: DC
  Administered 2014-09-03: 1000 mL via INTRAVENOUS
  Administered 2014-09-03 – 2014-09-05 (×3): via INTRAVENOUS
  Administered 2014-09-06: 100 mL via INTRAVENOUS
  Administered 2014-09-07: 02:00:00 via INTRAVENOUS
  Administered 2014-09-08 (×2): 250 mL via INTRAVENOUS
  Administered 2014-09-09 – 2014-09-10 (×2): via INTRAVENOUS

## 2014-09-03 MED ORDER — DEXTROSE 5 % IV SOLN
30.0000 mmol | Freq: Once | INTRAVENOUS | Status: AC
  Administered 2014-09-03: 30 mmol via INTRAVENOUS
  Filled 2014-09-03: qty 10

## 2014-09-03 MED ORDER — DEXTROSE 5 % IV SOLN
500.0000 mg | Freq: Two times a day (BID) | INTRAVENOUS | Status: DC
  Administered 2014-09-03 – 2014-09-08 (×11): 500 mg via INTRAVENOUS
  Filled 2014-09-03 (×16): qty 5

## 2014-09-03 NOTE — Consult Note (Signed)
[facility] Consult   Reason for Consult:  Schizophrenia medication management Referring Physician:  Dr. Chase Caller Patient Identification: Steven David MRN:  [id number] Principal Diagnosis: Schizophrenia Diagnosis:   Patient Active Problem List   Diagnosis Date Noted  . Seizure [R56.9]   . Incarcerated inguinal hernia [K40.30] 09/01/2014  . Respiratory failure, acute [J96.00] 09/01/2014  . Metabolic acidosis [I69.6] 09/01/2014  . Incarcerated hernia [K46.0] 09/01/2014  . Septic shock [A41.9, R65.21] 09/01/2014  . AKI (acute kidney injury) [N17.9]   . Protein-calorie malnutrition, severe [E43] 04/13/2013  . Sepsis [A41.9] 04/11/2013  . H/O stroke within last year [Z86.73] 04/11/2013  . Hypothyroidism [E03.9] 04/11/2013  . Pneumonia [J18.9] 04/11/2013  . Thrombocytopenia [D69.6] 04/11/2013  . Schizophrenia [F20.9] 04/11/2013  . Malnutrition [E46] 04/11/2013  . SIRS (systemic inflammatory response syndrome) [A41.9] 04/11/2013    Total Time spent with patient: 45 minutes  Subjective:   Steven David is a 59 y.o. male patient admitted with strangulated left inguinal hernia and status post intubation.  HPI: Steven David is a 59 yo male, resident of group home member seen for the face to face psychiatric consultation and evaluation of schizophrenia and medication management. Patient is currently NPO. Patient is a poor historian and difficult to understand 50 % of language due to dysphasia.  He appeared staying in his bed, awake, alert and poorly oriented other than his name. Patient has no irritability, agitation or aggression at this time. He had one episode of seizure and neurologist cleared at this time due to no findings on his EEG. Patient home medication for psychosis was on hold due to NPO status. He is taking valproic acid and will start Geodon/Haldol IM PRN for possible agitation and aggression. He may start his oral medication as soon as NPO status changes  from Surgery. He is currently denied depression, anxiety and psychosis, SI/HI.   HPI Elements:   Location:  Schizophrenia. Quality:  poor. Severity:  status post surgery and intubation. Timing:  inguinal hernia repair. Duration:  few days. Context:  resident of group home.  Past Medical History:  Past Medical History  Diagnosis Date  . Schizophrenia   . Hernia   . Thyroid disease   . High cholesterol   . HOH (hard of hearing)     Past Surgical History  Procedure Laterality Date  . Leg surgery    . Testicle surgery    . Laparotomy N/A 09/01/2014    Procedure: EXPLORATORY LAPAROTOMY, SIGMOID COLECTOMY, SIGMOID COLOSTOMY, PRIMARY REPAIR OF STRANGULATED INGUINAL HERNIA.;  Surgeon: Rolm Bookbinder, MD;  Location: [facility];  Service: General;  Laterality: N/A;   Family History: No family history on file. Social History:  History  Alcohol Use No     History  Drug Use No    Social History   Social History  . Marital Status: Single    Spouse Name: N/A  . Number of Children: N/A  . Years of Education: N/A   Social History Main Topics  . Smoking status: Former Research scientist (life sciences)  . Smokeless tobacco: None  . Alcohol Use: No  . Drug Use: No  . Sexual Activity: Not Asked   Other Topics Concern  . None   Social History Narrative   Additional Social History:                          Allergies:  No Known Allergies  Labs:  Results for orders placed or performed during the hospital  encounter of 08/31/14 (from the past 48 hour(s))  Lactic acid, plasma     Status: None   Collection Time: 09/01/14 11:25 AM  Result Value Ref Range   Lactic Acid, Venous 1.1 0.5 - 2.0 mmol/L  Glucose, capillary     Status: Abnormal   Collection Time: 09/01/14 12:00 PM  Result Value Ref Range   Glucose-Capillary 60 (L) 65 - 99 mg/dL   Comment 1 Notify RN    Comment 2 Document in Chart   Glucose, capillary     Status: None   Collection Time: 09/01/14 12:40 PM  Result Value Ref Range    Glucose-Capillary 91 65 - 99 mg/dL  Carboxyhemoglobin     Status: Abnormal   Collection Time: 09/01/14 12:55 PM  Result Value Ref Range   Total hemoglobin 10.4 (L) 13.5 - 18.0 g/dL   O2 Saturation 92.3 %   Carboxyhemoglobin 1.0 0.5 - 1.5 %   Methemoglobin 1.5 0.0 - 1.5 %  Culture, blood (routine x 2)     Status: None (Preliminary result)   Collection Time: 09/01/14  3:16 PM  Result Value Ref Range   Specimen Description BLOOD RIGHT HAND    Special Requests IN PEDIATRIC BOTTLE 2CC    Culture NO GROWTH 1 DAY    Report Status PENDING   Culture, blood (routine x 2)     Status: None (Preliminary result)   Collection Time: 09/01/14  3:30 PM  Result Value Ref Range   Specimen Description BLOOD LEFT HAND    Special Requests IN PEDIATRIC BOTTLE 3CC    Culture NO GROWTH 1 DAY    Report Status PENDING   Glucose, capillary     Status: Abnormal   Collection Time: 09/01/14  3:30 PM  Result Value Ref Range   Glucose-Capillary 103 (H) 65 - 99 mg/dL  CBC with Differential/Platelet     Status: Abnormal   Collection Time: 09/01/14  4:25 PM  Result Value Ref Range   WBC 11.4 (H) 4.0 - 10.5 K/uL   RBC 3.56 (L) 4.22 - 5.81 MIL/uL   Hemoglobin 10.3 (L) 13.0 - 17.0 g/dL   HCT 31.0 (L) 39.0 - 52.0 %   MCV 87.1 78.0 - 100.0 fL   MCH 28.9 26.0 - 34.0 pg   MCHC 33.2 30.0 - 36.0 g/dL   RDW 14.7 11.5 - 15.5 %   Platelets 93 (L) 150 - 400 K/uL    Comment: CONSISTENT WITH PREVIOUS RESULT   Neutrophils Relative % 7 (L) 43 - 77 %   Lymphocytes Relative 9 (L) 12 - 46 %   Monocytes Relative 5 3 - 12 %   Eosinophils Relative 0 0 - 5 %   Basophils Relative 0 0 - 1 %   Band Neutrophils 58 (H) 0 - 10 %   Metamyelocytes Relative 17 %   Myelocytes 3 %   Promyelocytes Absolute 0 %   Blasts 0 %   nRBC 0 0 /100 WBC   Other 1 %   Neutro Abs 9.7 (H) 1.7 - 7.7 K/uL   Lymphs Abs 1.0 0.7 - 4.0 K/uL   Monocytes Absolute 0.6 0.1 - 1.0 K/uL   Eosinophils Absolute 0.0 0.0 - 0.7 K/uL   Basophils Absolute 0.0 0.0 -  0.1 K/uL   WBC Morphology INCREASED BANDS (>20% BANDS)     Comment: MODERATE LEFT SHIFT (>5% METAS AND MYELOS,OCC PRO NOTED)  Basic metabolic panel     Status: Abnormal   Collection Time: 09/01/14  4:25  PM  Result Value Ref Range   Sodium 141 135 - 145 mmol/L   Potassium 3.8 3.5 - 5.1 mmol/L   Chloride 117 (H) 101 - 111 mmol/L   CO2 17 (L) 22 - 32 mmol/L   Glucose, Bld 119 (H) 65 - 99 mg/dL   BUN 30 (H) 6 - 20 mg/dL   Creatinine, Ser 1.38 (H) 0.61 - 1.24 mg/dL   Calcium 7.1 (L) 8.9 - 10.3 mg/dL   GFR calc non Af Amer 55 (L) >60 mL/min   GFR calc Af Amer >60 >60 mL/min    Comment: (NOTE) The eGFR has been calculated using the CKD EPI equation. This calculation has not been validated in all clinical situations. eGFR's persistently <60 mL/min signify possible Chronic Kidney Disease.    Anion gap 7 5 - 15  Magnesium     Status: Abnormal   Collection Time: 09/01/14  4:25 PM  Result Value Ref Range   Magnesium 1.4 (L) 1.7 - 2.4 mg/dL  Phosphorus     Status: None   Collection Time: 09/01/14  4:25 PM  Result Value Ref Range   Phosphorus 2.9 2.5 - 4.6 mg/dL  Glucose, capillary     Status: Abnormal   Collection Time: 09/01/14  7:40 PM  Result Value Ref Range   Glucose-Capillary 105 (H) 65 - 99 mg/dL  Troponin I     Status: Abnormal   Collection Time: 09/01/14  7:54 PM  Result Value Ref Range   Troponin I 0.06 (H) <0.031 ng/mL    Comment:        PERSISTENTLY INCREASED TROPONIN VALUES IN THE RANGE OF 0.04-0.49 ng/mL CAN BE SEEN IN:       -UNSTABLE ANGINA       -CONGESTIVE HEART FAILURE       -MYOCARDITIS       -CHEST TRAUMA       -ARRYHTHMIAS       -LATE PRESENTING MYOCARDIAL INFARCTION       -COPD   CLINICAL FOLLOW-UP RECOMMENDED.   Glucose, capillary     Status: None   Collection Time: 09/01/14 11:45 PM  Result Value Ref Range   Glucose-Capillary 94 65 - 99 mg/dL  CBC     Status: Abnormal   Collection Time: 09/02/14 12:21 AM  Result Value Ref Range   WBC 11.7 (H) 4.0  - 10.5 K/uL   RBC 3.36 (L) 4.22 - 5.81 MIL/uL   Hemoglobin 10.0 (L) 13.0 - 17.0 g/dL   HCT 29.7 (L) 39.0 - 52.0 %   MCV 88.4 78.0 - 100.0 fL   MCH 29.8 26.0 - 34.0 pg   MCHC 33.7 30.0 - 36.0 g/dL   RDW 15.0 11.5 - 15.5 %   Platelets 86 (L) 150 - 400 K/uL    Comment: CONSISTENT WITH PREVIOUS RESULT  Basic metabolic panel     Status: Abnormal   Collection Time: 09/02/14 12:21 AM  Result Value Ref Range   Sodium 141 135 - 145 mmol/L   Potassium 3.8 3.5 - 5.1 mmol/L   Chloride 116 (H) 101 - 111 mmol/L   CO2 17 (L) 22 - 32 mmol/L   Glucose, Bld 101 (H) 65 - 99 mg/dL   BUN 26 (H) 6 - 20 mg/dL   Creatinine, Ser 1.39 (H) 0.61 - 1.24 mg/dL   Calcium 7.3 (L) 8.9 - 10.3 mg/dL   GFR calc non Af Amer 55 (L) >60 mL/min   GFR calc Af Amer >60 >60 mL/min  Comment: (NOTE) The eGFR has been calculated using the CKD EPI equation. This calculation has not been validated in all clinical situations. eGFR's persistently <60 mL/min signify possible Chronic Kidney Disease.    Anion gap 8 5 - 15  Glucose, capillary     Status: None   Collection Time: 09/02/14  4:28 AM  Result Value Ref Range   Glucose-Capillary 88 65 - 99 mg/dL  Procalcitonin     Status: None   Collection Time: 09/02/14  4:46 AM  Result Value Ref Range   Procalcitonin 26.69 ng/mL    Comment:        Interpretation: PCT >= 10 ng/mL: Important systemic inflammatory response, almost exclusively due to severe bacterial sepsis or septic shock. (NOTE)         ICU PCT Algorithm               Non ICU PCT Algorithm    ----------------------------     ------------------------------         PCT < 0.25 ng/mL                 PCT < 0.1 ng/mL     Stopping of antibiotics            Stopping of antibiotics       strongly encouraged.               strongly encouraged.    ----------------------------     ------------------------------       PCT level decrease by               PCT < 0.25 ng/mL       >= 80% from peak PCT       OR PCT 0.25 -  0.5 ng/mL          Stopping of antibiotics                                             encouraged.     Stopping of antibiotics           encouraged.    ----------------------------     ------------------------------       PCT level decrease by              PCT >= 0.25 ng/mL       < 80% from peak PCT        AND PCT >= 0.5 ng/mL             Continuing antibiotics                                              encouraged.       Continuing antibiotics            encouraged.    ----------------------------     ------------------------------     PCT level increase compared          PCT > 0.5 ng/mL         with peak PCT AND          PCT >= 0.5 ng/mL             Escalation of antibiotics  strongly encouraged.      Escalation of antibiotics        strongly encouraged.   Magnesium     Status: None   Collection Time: 09/02/14  4:46 AM  Result Value Ref Range   Magnesium 2.0 1.7 - 2.4 mg/dL  Phosphorus     Status: None   Collection Time: 09/02/14  4:46 AM  Result Value Ref Range   Phosphorus 2.7 2.5 - 4.6 mg/dL  Hepatic function panel     Status: Abnormal   Collection Time: 09/02/14  4:46 AM  Result Value Ref Range   Total Protein 4.6 (L) 6.5 - 8.1 g/dL   Albumin 2.2 (L) 3.5 - 5.0 g/dL   AST 34 15 - 41 U/L   ALT 18 17 - 63 U/L   Alkaline Phosphatase 36 (L) 38 - 126 U/L   Total Bilirubin 1.2 0.3 - 1.2 mg/dL   Bilirubin, Direct 0.3 0.1 - 0.5 mg/dL   Indirect Bilirubin 0.9 0.3 - 0.9 mg/dL  Albumin     Status: Abnormal   Collection Time: 09/02/14  9:30 AM  Result Value Ref Range   Albumin 2.3 (L) 3.5 - 5.0 g/dL  CBC     Status: Abnormal   Collection Time: 09/03/14  4:54 AM  Result Value Ref Range   WBC 10.9 (H) 4.0 - 10.5 K/uL   RBC 3.30 (L) 4.22 - 5.81 MIL/uL   Hemoglobin 9.5 (L) 13.0 - 17.0 g/dL   HCT 28.9 (L) 39.0 - 52.0 %   MCV 87.6 78.0 - 100.0 fL   MCH 28.8 26.0 - 34.0 pg   MCHC 32.9 30.0 - 36.0 g/dL   RDW 15.5 11.5 - 15.5 %    Platelets 79 (L) 150 - 400 K/uL    Comment: CONSISTENT WITH PREVIOUS RESULT  Basic metabolic panel     Status: Abnormal   Collection Time: 09/03/14  4:54 AM  Result Value Ref Range   Sodium 157 (H) 135 - 145 mmol/L    Comment: DELTA CHECK NOTED   Potassium 3.4 (L) 3.5 - 5.1 mmol/L   Chloride 128 (H) 101 - 111 mmol/L   CO2 21 (L) 22 - 32 mmol/L   Glucose, Bld 90 65 - 99 mg/dL   BUN 16 6 - 20 mg/dL   Creatinine, Ser 0.97 0.61 - 1.24 mg/dL   Calcium 8.2 (L) 8.9 - 10.3 mg/dL   GFR calc non Af Amer >60 >60 mL/min   GFR calc Af Amer >60 >60 mL/min    Comment: (NOTE) The eGFR has been calculated using the CKD EPI equation. This calculation has not been validated in all clinical situations. eGFR's persistently <60 mL/min signify possible Chronic Kidney Disease.    Anion gap 8 5 - 15  Procalcitonin     Status: None   Collection Time: 09/03/14  4:54 AM  Result Value Ref Range   Procalcitonin 11.06 ng/mL    Comment:        Interpretation: PCT >= 10 ng/mL: Important systemic inflammatory response, almost exclusively due to severe bacterial sepsis or septic shock. (NOTE)         ICU PCT Algorithm               Non ICU PCT Algorithm    ----------------------------     ------------------------------         PCT < 0.25 ng/mL                 PCT < 0.1 ng/mL  Stopping of antibiotics            Stopping of antibiotics       strongly encouraged.               strongly encouraged.    ----------------------------     ------------------------------       PCT level decrease by               PCT < 0.25 ng/mL       >= 80% from peak PCT       OR PCT 0.25 - 0.5 ng/mL          Stopping of antibiotics                                             encouraged.     Stopping of antibiotics           encouraged.    ----------------------------     ------------------------------       PCT level decrease by              PCT >= 0.25 ng/mL       < 80% from peak PCT        AND PCT >= 0.5 ng/mL              Continuing antibiotics                                              encouraged.       Continuing antibiotics            encouraged.    ----------------------------     ------------------------------     PCT level increase compared          PCT > 0.5 ng/mL         with peak PCT AND          PCT >= 0.5 ng/mL             Escalation of antibiotics                                          strongly encouraged.      Escalation of antibiotics        strongly encouraged.   Magnesium     Status: None   Collection Time: 09/03/14  4:54 AM  Result Value Ref Range   Magnesium 2.1 1.7 - 2.4 mg/dL  Phosphorus     Status: Abnormal   Collection Time: 09/03/14  4:54 AM  Result Value Ref Range   Phosphorus 1.5 (L) 2.5 - 4.6 mg/dL  TSH     Status: None   Collection Time: 09/03/14  4:54 AM  Result Value Ref Range   TSH 0.955 0.350 - 4.500 uIU/mL    Vitals: Blood pressure 122/62, pulse 94, temperature 98.7 F (37.1 C), temperature source Oral, resp. rate 24, height _0  (1.676 m), weight 58.4 kg (128 lb 12 oz), SpO2 93 %.  Risk to Self: Is patient at risk for suicide?: No Risk to Others:   Prior Inpatient Therapy:   Prior Outpatient Therapy:    Current Facility-Administered Medications  Medication Dose Route Frequency Provider Last Rate Last Dose  . antiseptic oral rinse solution (CORINZ)  7 mL Mouth Rinse QID Saverio Danker, PA-C   7 mL at 09/02/14 0403  . chlorhexidine gluconate (PERIDEX) 0.12 % solution 15 mL  15 mL Mouth Rinse BID Saverio Danker, PA-C   15 mL at 09/03/14 0857  . dextrose 5 % solution   Intravenous Continuous Brand Males, MD      . fentaNYL (SUBLIMAZE) injection 50-200 mcg  50-200 mcg Intravenous Q2H PRN Brand Males, MD   100 mcg at 09/02/14 0615  . haloperidol (HALDOL) tablet 10 mg  10 mg Oral QPM Saverio Danker, PA-C      . Derrill Memo ON 09/04/2014] levothyroxine (SYNTHROID, LEVOTHROID) tablet 100 mcg  100 mcg Oral QAC breakfast Saverio Danker, PA-C      . midazolam  (VERSED) injection 2 mg  2 mg Intravenous Q15 min PRN Brand Males, MD   2 mg at 09/02/14 0243  . midazolam (VERSED) injection 2 mg  2 mg Intravenous Q2H PRN Brand Males, MD   2 mg at 09/01/14 2141  . ondansetron (ZOFRAN-ODT) disintegrating tablet 4 mg  4 mg Oral Q6H PRN Rolm Bookbinder, MD       Or  . ondansetron [facility]) injection 4 mg  4 mg Intravenous Q6H PRN Rolm Bookbinder, MD      . pantoprazole (PROTONIX) injection 40 mg  40 mg Intravenous Q24H Anders Simmonds, MD   40 mg at 09/03/14 0546  . potassium phosphate 30 mmol in dextrose 5 % 500 mL infusion  30 mmol Intravenous Once Colbert Coyer, MD   30 mmol at 09/03/14 5027  . silver nitrate applicators applicator 1 application  1 application Topical PRN Saverio Danker, PA-C      . valproate (DEPACON) 500 mg in dextrose 5 % 50 mL IVPB  500 mg Intravenous 3 times per day Greta Doom, MD   500 mg at 09/03/14 0546  . ziprasidone (GEODON) injection 10 mg  10 mg Intramuscular Q6H PRN Ambrose Finland, MD        Musculoskeletal: Strength & Muscle Tone: decreased Gait & Station: unable to stand Patient leans: N/A  Psychiatric Specialty Exam: Physical Exam  Vitals reviewed.   ROS denied nausea, vomiting and sob and chest pain.  Blood pressure 122/62, pulse 94, temperature 98.7 F (37.1 C), temperature source Oral, resp. rate 24, height $RemoveBe'5\' 6"'tAkHflVdg$  (1.676 m), weight 58.4 kg (128 lb 12 oz), SpO2 93 %.Body mass index is 20.79 kg/(m^2).  General Appearance: Disheveled and Guarded  Eye Contact::  Good  Speech:  Garbled and Slow  Volume:  Decreased  Mood:  Depressed  Affect:  Constricted and Depressed  Thought Process:  Disorganized and Irrelevant  Orientation:  Full (Time, Place, and Person)  Thought Content:  WDL  Suicidal Thoughts:  No  Homicidal Thoughts:  No  Memory:  Immediate;   Fair Recent;   Poor  Judgement:  Impaired  Insight:  Lacking  Psychomotor Activity:  Decreased  Concentration:  Fair   Recall:  Poor  Fund of Knowledge:Fair  Language: Fair  Akathisia:  Negative  Handed:  Right  AIMS (if indicated):     Assets:  Communication Skills Desire for Improvement Financial Resources/Insurance Housing Leisure Time Resilience Social Support  ADL's:  Impaired  Cognition: Impaired,  Mild  Sleep:      Medical Decision Making: New problem, with additional work up planned, Review of Psycho-Social Stressors (1), Review or order clinical lab  tests (1), Review of Last Therapy Session (1), Review or order medicine tests (1), Review of Medication Regimen & Side Effects (2) and Review of New Medication or Change in Dosage (2)  Treatment Plan Summary: Daily contact with patient to assess and evaluate symptoms and progress in treatment and Medication management  Plan:  Case discussed with LCSW and will obtain collateral information from group home and [facility]. Will start Geodon 10 mg IM Q6H/PRN for agitation and aggression Continue Depacone 500 mg Q8H as scheduled and check valproic acid level May start home medication haldol 10 mg daily when his NPO status changed Check with [facility] regading his haldol depot 100 mg next due date.   Patient does not meet criteria for psychiatric inpatient admission. Supportive therapy provided about ongoing stressors.  Appreciate psychiatric consultation Please contact [phone] or [phone] if needs further assistance   Disposition: May discharge to group home when medically and surgically stable for out patient psychiatric services at [facility] recovery.   Paizlee Kinder,JANARDHAHA R. 09/03/2014 11:24 AM

## 2014-09-03 NOTE — Progress Notes (Signed)
[facility] Progress Note Patient Name: Steven David DOB: 04-10-1955 MRN: [id number]   Date of Service  09/03/2014  HPI/Events of Note  Hypokalemia and hypophosphatemia  eICU Interventions  Potassium and phos replaced     Intervention Category Intermediate Interventions: Electrolyte abnormality - evaluation and management  DETERDING,ELIZABETH 09/03/2014, 5:51 AM

## 2014-09-03 NOTE — Progress Notes (Signed)
Patient ID: Steven David, male   DOB: 1955/05/26, 60 y.o.   MRN: [id number] 2 Days Post-Op  Subjective: Pt extubated and comfortable.  Wants to drink.  No further seizures.  Objective: Vital signs in last 24 hours: Temp:  [98 F (36.7 C)-99.2 F (37.3 C)] 98.7 F (37.1 C) (08/25 0755) Pulse Rate:  [37-114] 99 (08/25 0800) Resp:  [12-25] 22 (08/25 0900) BP: (105-135)/(56-120) 109/75 mmHg (08/25 0800) SpO2:  [95 %-100 %] 95 % (08/25 0800) Arterial Line BP: (84-164)/(48-77) 142/68 mmHg (08/25 0900)    Intake/Output from previous day: 08/24 0701 - 08/25 0700 In: 3485 [I.V.:3375; IV Piggyback:110] Out: 5125 [Urine:5125] Intake/Output this shift: Total I/O In: 250 [I.V.:250] Out: -   PE: Abd: soft, midline wound is clean and still with a little ooze, but much better than yesterday, few BS, ostomy with air output, mildly bloody drainage.  Stoma is pink and viable. Heart: regular Lungs: CTAB  Lab Results:   Recent Labs  09/02/14 0021 09/03/14 0454  WBC 11.7* 10.9*  HGB 10.0* 9.5*  HCT 29.7* 28.9*  PLT 86* 79*   BMET  Recent Labs  09/02/14 0021 09/03/14 0454  NA 141 157*  K 3.8 3.4*  CL 116* 128*  CO2 17* 21*  GLUCOSE 101* 90  BUN 26* 16  CREATININE 1.39* 0.97  CALCIUM 7.3* 8.2*   PT/INR No results for input(s): LABPROT, INR in the last 72 hours. CMP     Component Value Date/Time   NA 157* 09/03/2014 0454   K 3.4* 09/03/2014 0454   CL 128* 09/03/2014 0454   CO2 21* 09/03/2014 0454   GLUCOSE 90 09/03/2014 0454   BUN 16 09/03/2014 0454   CREATININE 0.97 09/03/2014 0454   CALCIUM 8.2* 09/03/2014 0454   PROT 4.6* 09/02/2014 0446   ALBUMIN 2.3* 09/02/2014 0930   AST 34 09/02/2014 0446   ALT 18 09/02/2014 0446   ALKPHOS 36* 09/02/2014 0446   BILITOT 1.2 09/02/2014 0446   GFRNONAA >60 09/03/2014 0454   GFRAA >60 09/03/2014 0454   Lipase     Component Value Date/Time   LIPASE 18 04/23/2013 1045       Studies/Results: Ct Head Wo  Contrast  09/02/2014   CLINICAL DATA:  Seizures, history of schizophrenia  EXAM: CT HEAD WITHOUT CONTRAST  TECHNIQUE: Contiguous axial images were obtained from the base of the skull through the vertex without intravenous contrast.  COMPARISON:  04/11/2013  FINDINGS: No skull fracture is noted. Paranasal sinuses are unremarkable. The mastoid air cells are unremarkable. No intracranial hemorrhage, mass effect or midline shift. Mild cerebral atrophy. Patchy subcortical white matter decreased attenuation probable due to chronic small vessel ischemic changes. 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 Again noted cerebellar atrophy.  IMPRESSION: No acute intracranial abnormality. No significant change. Mild cerebral atrophy. Stable small lacunar infarct in left caudate nucleus.   Electronically Signed   By: Lahoma Crocker M.D.   On: 09/02/2014 17:07   Dg Chest Port 1 View  09/02/2014   CLINICAL DATA:  History of endotracheal tube.  History of seizure.  EXAM: PORTABLE CHEST - 1 VIEW  COMPARISON:  09/01/2014  FINDINGS: Magdalene Molly is difficult to visualize but the endotracheal tube appears to be approximately 2.9 cm above the expected location of the carina. Negative for a pneumothorax. Right central line tip in the SVC region. Nasogastric tube in the gastric fundus region. Lungs are clear without airspace disease. Heart size is normal.  IMPRESSION: No focal lung disease.  Support apparatuses as described.   Electronically Signed   By: Markus Daft M.D.   On: 09/02/2014 07:16   Dg Chest Port 1 View  09/01/2014   CLINICAL DATA:  Central line placement  EXAM: PORTABLE CHEST - 1 VIEW  COMPARISON:  700 hours  FINDINGS: Right internal jugular central venous catheter has been placed. Tip is at the cavoatrial junction. There is no pneumothorax. Endotracheal and NG tubes are stable. Stable appearance of the heart and lungs.  IMPRESSION: Right internal jugular central venous catheter placement with its tip at the cavoatrial  junction and no pneumothorax.   Electronically Signed   By: Marybelle Killings M.D.   On: 09/01/2014 09:22    Anti-infectives: Anti-infectives    Start     Dose/Rate Route Frequency Ordered Stop   09/02/14 0930  cefTRIAXone (ROCEPHIN) 2 g in dextrose 5 % 50 mL IVPB  Status:  Discontinued     2 g 100 mL/hr over 30 Minutes Intravenous Every 24 hours 09/02/14 0833 09/03/14 0723   09/01/14 1600  cefoTEtan (CEFOTAN) 2 g in dextrose 5 % 50 mL IVPB     2 g 100 mL/hr over 30 Minutes Intravenous Every 12 hours 09/01/14 0705 09/02/14 0433   09/01/14 0330  cefOXitin (MEFOXIN) 2 g in dextrose 5 % 50 mL IVPB     2 g 100 mL/hr over 30 Minutes Intravenous  Once 09/01/14 0328 09/01/14 0433   09/01/14 0200  piperacillin-tazobactam (ZOSYN) IVPB 3.375 g     3.375 g 100 mL/hr over 30 Minutes Intravenous  Once 09/01/14 0156 09/01/14 0311       Assessment/Plan  POD 2, s/p ex lap with sigmoid colectomy, colostomy, repair of LIH, Dr. Donne Hazel -cont BID dressing changes, with abdominal binder -ok for clears, except when given OJ RN concerned about swallowing ability.  Will try and get a swallow eval prior to clear liquids today. -WOC consult for routine ostomy care, appreciate their assistance -mobilize with PT, baseline uses a cane -Dr. Donne Hazel does not feel patient needs any further abx therapy.  He has no fever, WBC normalized, and no longer hypotensive.  Will DC abx therapy -abdominal binder to help with compression on abdominal wound DVT Prophylaxis -SCDs -lovenox held due to general oozing and thrombocytopenia  Thrombocytopenia -appears chronic as he came in this way. Generally oozing. Hold chemical prophylaxis VDRF -extubated.  Appreciate CCM assistance -A-line DC today as well Schizophrenia -holdol resumed today. -appreciate psych assistance.  Will touch base with them about lithium recommendations given new addition of geodon and depakote Hypothyroidism -on IV synthroid for now New onset  seizures -neurology on board. Appreciate their assitance -CT of his head negative, EEG normal -on depakote  Tx to SDU today.  LOS: 2 days    Xiomar Crompton E 09/03/2014, 9:09 AM Pager: [phone]

## 2014-09-03 NOTE — Consult Note (Signed)
PULMONARY / CRITICAL CARE MEDICINE   Name: Steven David MRN: [id number] DOB: 1955/04/09    ADMISSION DATE:  08/31/2014 CONSULTATION DATE:  8/22  REFERRING MD :  CCS  CHIEF COMPLAINT:  N/V  INITIAL PRESENTATION:   59 yo group home member(scizophrenic)with long standing LIH, who was taken to OR 8/22 with strangulated LIH, necrotic bowel which required, Sigmoid colectomy/colostomy. He was left intubated per CCS but can be extubated once stable. His abg reveals metabolic acidosis(mild) and he is currently on precedex. We will begin weaning as tolerated.    09/02/2014 Meets SBT Criteria. Had seizure x 2 - Rx with versed.  On Keppra initially but now on depakote. CT head pending. CCS ok with extubation. Denies abd pain   SUBJECTIVE/OVERNIGHT/INTERVAL HX 09/03/14 - off lovenox due to mild low plateletn and easy ozzing per Saverio Danker surgery PA. He is workign with PT - who say he seesm a bit tachypnenic ? Pain mediated but he denies. Otherwise ok. CCS plans on SDU transfer. Psych has him on geodon prn + scheduled haldol . Lithium resttart pending   VITAL SIGNS: Temp:  [98 F (36.7 C)-99.2 F (37.3 C)] 98.7 F (37.1 C) (08/25 0755) Pulse Rate:  [37-105] 94 (08/25 1000) Resp:  [12-25] 24 (08/25 1000) BP: (105-147)/(56-120) 122/62 mmHg (08/25 1000) SpO2:  [93 %-100 %] 93 % (08/25 1000) Arterial Line BP: (84-164)/(48-77) 137/60 mmHg (08/25 1000) HEMODYNAMICS:   VENTILATOR SETTINGS:   INTAKE / OUTPUT:  Intake/Output Summary (Last 24 hours) at 09/03/14 1112 Last data filed at 09/03/14 1000  Gross per 24 hour  Intake   3110 ml  Output   4350 ml  Net  -1240 ml    PHYSICAL EXAMINATION: General: Looks deconditioned. Sitting on side of bed with walker and PT Neuro:  Alert , said hi, Sitting up HEENT: no jvd/lan, OTT->vent Cardiovascular:  HSr RRR 62 Lungs:  CTA but mildly tachypneic Abdomen:  Dressing CDI, gastric tube with bloody drainage Musculoskeletal:  intact Skin: left  cold, no dorsalis pedis pulse to palpation or doppler  LABS:  PULMONARY  Recent Labs Lab 09/01/14 0450 09/01/14 1255  PHART 7.311*  --   PCO2ART 35.4  --   PO2ART 325.0*  --   HCO3 17.9*  --   TCO2 19  --   O2SAT 100.0 92.3    CBC  Recent Labs Lab 09/01/14 1625 09/02/14 0021 09/03/14 0454  HGB 10.3* 10.0* 9.5*  HCT 31.0* 29.7* 28.9*  WBC 11.4* 11.7* 10.9*  PLT 93* 86* 79*    COAGULATION No results for input(s): INR in the last 168 hours.  CARDIAC    Recent Labs Lab 09/01/14 1954  TROPONINI 0.06*   No results for input(s): PROBNP in the last 168 hours.   CHEMISTRY  Recent Labs Lab 08/31/14 2250 09/01/14 0450 09/01/14 0900 09/01/14 1625 09/02/14 0021 09/02/14 0446 09/03/14 0454  NA 135 139 139 141 141  --  157*  K 5.4* 3.8 4.2 3.8 3.8  --  3.4*  CL 104  --  115* 117* 116*  --  128*  CO2 19*  --  19* 17* 17*  --  21*  GLUCOSE 123*  --  102* 119* 101*  --  90  BUN 39*  --  36* 30* 26*  --  16  CREATININE 2.01*  --  1.70* 1.38* 1.39*  --  0.97  CALCIUM 8.8*  --  7.0* 7.1* 7.3*  --  8.2*  MG  --   --   --  1.4*  --  2.0 2.1  PHOS  --   --   --  2.9  --  2.7 1.5*   Estimated Creatinine Clearance: 69.4 mL/min (by C-G formula based on Cr of 0.97).   LIVER  Recent Labs Lab 09/02/14 0446 09/02/14 0930  AST 34  --   ALT 18  --   ALKPHOS 36*  --   BILITOT 1.2  --   PROT 4.6*  --   ALBUMIN 2.2* 2.3*     INFECTIOUS  Recent Labs Lab 09/01/14 0059 09/01/14 0900 09/01/14 1125 09/02/14 0446 09/03/14 0454  LATICACIDVEN 4.7*  --  1.1  --   --   PROCALCITON  --  33.48  --  26.69 11.06     ENDOCRINE CBG (last 3)   Recent Labs  09/01/14 1940 09/01/14 2345 09/02/14 0428  GLUCAP 105* 94 88         IMAGING x48h  - image(s) personally visualized  -   highlighted in bold Ct Head Wo Contrast  09/02/2014   CLINICAL DATA:  Seizures, history of schizophrenia  EXAM: CT HEAD WITHOUT CONTRAST  TECHNIQUE: Contiguous axial images were  obtained from the base of the skull through the vertex without intravenous contrast.  COMPARISON:  04/11/2013  FINDINGS: No skull fracture is noted. Paranasal sinuses are unremarkable. The mastoid air cells are unremarkable. No intracranial hemorrhage, mass effect or midline shift. Mild cerebral atrophy. Patchy subcortical white matter decreased attenuation probable due to chronic small vessel ischemic changes. 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 Again noted cerebellar atrophy.  IMPRESSION: No acute intracranial abnormality. No significant change. Mild cerebral atrophy. Stable small lacunar infarct in left caudate nucleus.   Electronically Signed   By: Lahoma Crocker M.D.   On: 09/02/2014 17:07   Dg Chest Port 1 View  09/02/2014   CLINICAL DATA:  History of endotracheal tube.  History of seizure.  EXAM: PORTABLE CHEST - 1 VIEW  COMPARISON:  09/01/2014  FINDINGS: Magdalene Molly is difficult to visualize but the endotracheal tube appears to be approximately 2.9 cm above the expected location of the carina. Negative for a pneumothorax. Right central line tip in the SVC region. Nasogastric tube in the gastric fundus region. Lungs are clear without airspace disease. Heart size is normal.  IMPRESSION: No focal lung disease.  Support apparatuses as described.   Electronically Signed   By: Markus Daft M.D.   On: 09/02/2014 07:16       ASSESSMENT / PLAN:  PULMONARY OETT 8/23>> 8/24 A: VDRF post colon resection complicated by metabolic acidosis   - s/p extubation 09/02/14. Mild tachypnea  P:   o2 for pulse o > 88% pulm toilet monitopr   CARDIOVASCULAR CVL rt i j 8/23>> Left radial a line>> A:  Post op hypotension -/ septic shock  Cool left foot due to hypotension - L PT was dopplerable   - off pressors x 48h  P:  Monitor off sepsis protiocl  RENAL Lab Results  Component Value Date   CREATININE 0.97 09/03/2014   CREATININE 1.39* 09/02/2014   CREATININE 1.38* 09/01/2014    A:   Renal  insuff 0- resolved byut has hypernatremia 157  P:   D5 water at 75cc/h DC normal [place] Avoid nephrotoxins  Recheck bmet at 9pm  GASTROINTESTINAL A:   Post bowel resection P:   PPI May need TNA  HEMATOLOGIC A:   No acute issue P:    INFECTIOUS A:   Post necrotic bowel  P:    Abx:  8/23 zoysn>>8.24, CEftriazone 09/02/14 >>8/25  - per ccs off abx   ENDOCRINE A:  Hypothroid P:   IV synthroid and check TSH  NEUROLOGIC A:   Baseline schizophrenic, able to walk and feed self - now iinability to give lithium with npo STATUS   - new seizures 09/02/2014 needing keppra and neuro consult. CT head ok. Seen by psych and neuro    P:   Per neuro and psyuch    FAMILY  - Updates: Lives in group home. None at bedside 09/02/2014. [place], [place] C: same   - Inter-disciplinary family meet or Palliative Care meeting due by:  day 7 which is 09/08/14   GLOBAL =- hypernatermia new onset - starting free water. eMD to track resopnse tonight. PCCM will sign off. If any issues call us back. D/w Wilmer Floor     Dr. Brand Males, M.D., F.C.C.P Pulmonary and Critical Care Medicine Staff Physician [facility] Pager: [phone], If no answer or between  15:00h - 7:00h: call [phone]  09/03/2014 11:12 AM

## 2014-09-03 NOTE — Progress Notes (Signed)
Subjective: No further seizures  Exam: Filed Vitals:   09/03/14 1200  BP:   Pulse: 99  Temp:   Resp: 26   Gen: In bed, NAD Resp: non-labored breathing, no acute distress Abd: soft, nt  Neuro: MS: awake, follows commands.  XO:VANV eye externally deviated compared to right, pt states baseline.  Motor: lifts all extremities from bed to command.   Pertinent Labs: Lithium level 0.54(L)   Impression: 59 yo M with longstanding schizophrenia(I suspect schizoaffective given that he is on lithium) disorder who presents with new onset seizures. Though he did have other factors which could contribute to seizure, I would favor continuing depakote for now, especially given the cortical atrophy seen on previous MRI.  If he desires to come off seizure medicine in the future, would repeat EEG as outpatient prior to stopping depakote.   Recommendations: 1) continue depakote 500mg  BID 2) discontinue keppra.  3) Please call with any further questions or concerns.   Roland Rack, MD [facility] [phone]  If 7pm- 7am, please page neurology on call as listed in [place].

## 2014-09-03 NOTE — Evaluation (Signed)
Physical Therapy Evaluation Patient Details Name: Steven David MRN: [id number] DOB: [birth year] Today's Date: 09/03/2014   History of Present Illness  Pt is a 59 y/o male who lives at a group home, and has a PMH that includes schizophrenia. Pt was taken to the OR 8/22 with strangulated LIH, necrotic bowel which required sigmoid colectomy/colostomy. He was left intubated and was extubated 8/24.   Clinical Impression  Pt admitted with above diagnosis. Pt currently with functional limitations due to the deficits listed below (see PT Problem List). At the time of PT eval pt was able to perform transfers with min assist +2. Pt very HOH and communication was difficult at times. Anticipate that pt will progress well. Pt will benefit from skilled PT to increase their independence and safety with mobility to allow discharge to the venue listed below.       Follow Up Recommendations Home health PT;Supervision/Assistance - 24 hour    Equipment Recommendations  Rolling walker with 5" wheels    Recommendations for Other Services       Precautions / Restrictions Precautions Precautions: Fall Restrictions Weight Bearing Restrictions: No      Mobility  Bed Mobility Overal bed mobility: Needs Assistance;+2 for physical assistance Bed Mobility: Supine to Sit     Supine to sit: Mod assist;+2 for physical assistance     General bed mobility comments: Assist for initiation of movement. Pt attempting to walk LE's off edge of bed, however required assist to complete. Bed pad also used to scoot out to EOB.   Transfers Overall transfer level: Needs assistance Equipment used: Rolling walker (2 wheeled) Transfers: Sit to/from [facility] Sit to Stand: Min assist;+2 safety/equipment Stand pivot transfers: Min assist;+2 physical assistance       General transfer comment: Assist as pt powered-up to full standing position. Pt stood ~2 minutes while peri-care was performed and LE's  were shaky/buckling while standing statically. Pt sat to rest, and then was able to stand and take a few pivotal steps around to the recliner. Used the walker well, however required assist again for steadying.   Ambulation/Gait             General Gait Details: Deferred as HR increased to 147 with mobility.   Stairs            Wheelchair Mobility    Modified Rankin (Stroke Patients Only)       Balance Overall balance assessment: Needs assistance Sitting-balance support: Feet supported;No upper extremity supported Sitting balance-Leahy Scale: Fair     Standing balance support: Bilateral upper extremity supported;During functional activity Standing balance-Leahy Scale: Poor                               Pertinent Vitals/Pain Pain Assessment: No/denies pain    Home Living Family/patient expects to be discharged to:: Group home                      Prior Function Level of Independence: Needs assistance   Gait / Transfers Assistance Needed: Uses a cane at baseline.     Comments: Pt was unable to provide many details of PLOF. It is unclear whether pt was independent with ADL's or independent with the cane.      Hand Dominance        Extremity/Trunk Assessment   Upper Extremity Assessment: Defer to OT evaluation  Lower Extremity Assessment: Generalized weakness      Cervical / Trunk Assessment: Normal (Forward head posture)  Communication   Communication: Expressive difficulties  Cognition Arousal/Alertness: Awake/alert Behavior During Therapy: WFL for tasks assessed/performed Overall Cognitive Status: History of cognitive impairments - at baseline                      General Comments      Exercises        Assessment/Plan    PT Assessment Patient needs continued PT services  PT Diagnosis Difficulty walking;Generalized weakness   PT Problem List Decreased strength;Decreased range of  motion;Decreased activity tolerance;Decreased balance;Decreased mobility;Decreased knowledge of use of DME;Decreased safety awareness;Decreased knowledge of precautions;Cardiopulmonary status limiting activity  PT Treatment Interventions DME instruction;Gait training;Stair training;Functional mobility training;Therapeutic activities;Therapeutic exercise;Neuromuscular re-education;Patient/family education   PT Goals (Current goals can be found in the Care Plan section) Acute Rehab PT Goals Patient Stated Goal: Drink water PT Goal Formulation: Patient unable to participate in goal setting Time For Goal Achievement: 09/17/14 Potential to Achieve Goals: Good    Frequency Min 3X/week   Barriers to discharge        Co-evaluation               End of Session Equipment Utilized During Treatment: Gait belt Activity Tolerance: Patient limited by fatigue Patient left: in chair;with call bell/phone within reach;with chair alarm set;with nursing/sitter in room Nurse Communication: Mobility status         Time: 8003-4917 PT Time Calculation (min) (ACUTE ONLY): 35 min   Charges:   PT Evaluation $Initial PT Evaluation Tier I: 1 Procedure PT Treatments $Therapeutic Activity: 8-22 mins   PT G Codes:        Rolinda Roan 2014-09-04, 1:51 PM   Rolinda Roan, PT, DPT Acute Rehabilitation Services Pager: [phone]

## 2014-09-04 ENCOUNTER — Inpatient Hospital Stay (HOSPITAL_COMMUNITY): Payer: Medicare Other

## 2014-09-04 LAB — BASIC METABOLIC PANEL
Anion gap: 5 (ref 5–15)
BUN: 10 mg/dL (ref 6–20)
CALCIUM: 8 mg/dL — AB (ref 8.9–10.3)
CO2: 25 mmol/L (ref 22–32)
Chloride: 125 mmol/L — ABNORMAL HIGH (ref 101–111)
Creatinine, Ser: 0.83 mg/dL (ref 0.61–1.24)
GFR calc Af Amer: >60 mL/min (ref >60)
GLUCOSE: 155 mg/dL — AB (ref 65–99)
POTASSIUM: 3.4 mmol/L — AB (ref 3.5–5.1)
SODIUM: 155 mmol/L — AB (ref 135–145)

## 2014-09-04 LAB — GLUCOSE, CAPILLARY: Glucose-Capillary: 129 mg/dL — ABNORMAL HIGH (ref 65–99)

## 2014-09-04 LAB — PHOSPHORUS: PHOSPHORUS: 1.7 mg/dL — AB (ref 2.5–4.6)

## 2014-09-04 LAB — MAGNESIUM: MAGNESIUM: 1.8 mg/dL (ref 1.7–2.4)

## 2014-09-04 MED ORDER — LEVOTHYROXINE SODIUM 100 MCG IV SOLR
50.0000 ug | Freq: Every day | INTRAVENOUS | Status: DC
  Filled 2014-09-04: qty 5

## 2014-09-04 MED ORDER — LIDOCAINE VISCOUS 2 % MT SOLN
OROMUCOSAL | Status: AC
  Filled 2014-09-04: qty 15

## 2014-09-04 MED ORDER — PRO-STAT SUGAR FREE PO LIQD: 30.0000 mL | Freq: Two times a day (BID) | ORAL | Status: DC

## 2014-09-04 MED ORDER — POTASSIUM PHOSPHATES 15 MMOLE/5ML IV SOLN
30.0000 mmol | Freq: Once | INTRAVENOUS | Status: AC
  Administered 2014-09-04: 30 mmol via INTRAVENOUS
  Filled 2014-09-04: qty 10

## 2014-09-04 MED ORDER — LEVOTHYROXINE SODIUM 100 MCG IV SOLR
50.0000 ug | Freq: Every day | INTRAVENOUS | Status: DC
  Administered 2014-09-04 – 2014-09-08 (×5): 50 ug via INTRAVENOUS
  Filled 2014-09-04 (×6): qty 5

## 2014-09-04 MED ORDER — JEVITY 1.2 CAL PO LIQD
1000.0000 mL | ORAL | Status: DC
  Administered 2014-09-04: 1000 mL
  Administered 2014-09-04: 65 mL/h
  Administered 2014-09-07: 1000 mL
  Filled 2014-09-04 (×4): qty 1000
  Filled 2014-09-04: qty 237
  Filled 2014-09-04 (×6): qty 1000

## 2014-09-04 MED ORDER — IOHEXOL 300 MG/ML  SOLN
50.0000 mL | Freq: Once | INTRAMUSCULAR | Status: DC | PRN
  Administered 2014-09-04: 50 mL via ORAL
  Filled 2014-09-04: qty 50

## 2014-09-04 MED ORDER — LEVOTHYROXINE SODIUM 100 MCG PO TABS
100.0000 ug | ORAL_TABLET | Freq: Every day | ORAL | Status: DC
  Filled 2014-09-04 (×2): qty 1

## 2014-09-04 NOTE — Progress Notes (Signed)
OT Cancellation Note  Patient Details Name: Steven David MRN: [id number] DOB: [birth year]   Cancelled Treatment:    Reason Eval/Treat Not Completed: Patient at procedure or test/ unavailable (Being seen by ST. Will return later. )  [place], OTR/L  [phone] 09/04/2014 09/04/2014, 8:47 AM

## 2014-09-04 NOTE — Progress Notes (Signed)
[facility] Progress Note Patient Name: Steven David DOB: 1955/04/16 MRN: [id number]   Date of Service  09/04/2014  HPI/Events of Note  Hypokalemia and hypophosphatemia  eICU Interventions  Potassium and phos replaced     Intervention Category Intermediate Interventions: Electrolyte abnormality - evaluation and management  [place]in[place] Rooke 09/04/2014, 6:09 AM

## 2014-09-04 NOTE — Evaluation (Signed)
Clinical/Bedside Swallow Evaluation Patient Details  Name: Steven David MRN: [id number] Date of Birth: 14-Nov-1955  Today's Date: 09/04/2014 Time: SLP Start Time (ACUTE ONLY): 0850 SLP Stop Time (ACUTE ONLY): 0903 SLP Time Calculation (min) (ACUTE ONLY): 13 min  Past Medical History:  Past Medical History  Diagnosis Date  . Schizophrenia   . Hernia   . Thyroid disease   . High cholesterol   . HOH (hard of hearing)    Past Surgical History:  Past Surgical History  Procedure Laterality Date  . Leg surgery    . Testicle surgery    . Laparotomy N/A 09/01/2014    Procedure: EXPLORATORY LAPAROTOMY, SIGMOID COLECTOMY, SIGMOID COLOSTOMY, PRIMARY REPAIR OF STRANGULATED INGUINAL HERNIA.;  Surgeon: Rolm Bookbinder, MD;  Location: [facility] OR;  Service: General;  Laterality: N/A;   HPI:  59 yo group home member(scizophrenic)with long standing LIH, who was taken to OR 8/22 with strangulated LIH, necrotic bowel which required, Sigmoid colectomy/colostomy. Intubated from 8/23-8/24. Pt has a history of severe dysphagia. with gross silent aspiration of residual  on last MBS 5/26 and pna consuming Dys3/nectar diet, though he has continued this diet with risk of aspiration.    Assessment / Plan / Recommendation Clinical Impression  Pt presents with profound dysphagia at baseline, has prior admissions for pna. Pt has been consuming a Dys 3/nectar thick diet with known risk of aspiration. Findings at bedside consistent with baseline function: delayed swallow, decreased anterior movement of hyoid, multiple swallows, cough and wet vocal quality, significant residue suctioned from pharynx with no gag reflex.   Pts function may be stable, but risk of aspiration is higher than at baseline due to loss of functional reserve due to illness and increased weakness. Any PO diet would have to be initiated with known risk of aspiration with pts comfort as a goal. Pt has previously reported he would not want a feeding  tube, though perhaps a short term method of feeding would be recommended until pts strength improves somewhat. Discussion needed with HCPOA.     Aspiration Risk  Severe    Diet Recommendation NPO   Medication Administration: Via alternative means    Other  Recommendations Oral Care Recommendations: Oral care QID Other Recommendations: Have oral suction available   Follow Up Recommendations       Frequency and Duration min 2x/week  2 weeks   Pertinent Vitals/Pain NA    SLP Swallow Goals     Swallow Study Prior Functional Status       General Other Pertinent Information: 59 yo group home member(scizophrenic)with long standing LIH, who was taken to OR 8/22 with strangulated LIH, necrotic bowel which required, Sigmoid colectomy/colostomy. Intubated from 8/23-8/24. Pt has a history of severe dysphagia. with gross silent aspiration of residual  on last MBS 5/26 and pna consuming Dys3/nectar diet, though he has continued this diet with risk of aspiration.  Type of Study: Bedside swallow evaluation Diet Prior to this Study: NPO Temperature Spikes Noted: No Respiratory Status: Room air History of Recent Intubation: Yes Length of Intubations (days): 2 days Date extubated: 09/02/14 Behavior/Cognition: Alert;Cooperative;Pleasant mood Oral Cavity - Dentition: Edentulous Self-Feeding Abilities: Able to feed self;Needs assist Patient Positioning: Upright in bed Baseline Vocal Quality:  (hypernasal) Volitional Cough: Cognitively unable to elicit Volitional Swallow: Able to elicit    Oral/Motor/Sensory Function Overall Oral Motor/Sensory Function: Impaired (generalized weakness, difficulty following commands) Labial ROM: Reduced right Labial Symmetry: Abnormal symmetry right Labial Strength: Reduced   Ice Chips Ice chips: Impaired Presentation:  Spoon Oral Phase Impairments: Impaired anterior to posterior transit;Reduced lingual movement/coordination Oral Phase Functional  Implications: Oral holding;Prolonged oral transit Pharyngeal Phase Impairments: Suspected delayed Swallow   Thin Liquid Thin Liquid: Not tested    Nectar Thick Nectar Thick Liquid: Impaired Presentation: Cup;Self Fed Oral Phase Impairments: Reduced labial seal;Reduced lingual movement/coordination;Impaired anterior to posterior transit Oral phase functional implications: Prolonged oral transit Pharyngeal Phase Impairments: Suspected delayed Swallow;Decreased hyoid-laryngeal movement;Multiple swallows;Wet Vocal Quality;Cough - Immediate   Honey Thick Honey Thick Liquid: Not tested   Puree Puree: Impaired Presentation: Spoon Oral Phase Impairments: Reduced lingual movement/coordination;Impaired anterior to posterior transit Oral Phase Functional Implications: Prolonged oral transit Pharyngeal Phase Impairments: Suspected delayed Swallow;Decreased hyoid-laryngeal movement;Multiple swallows;Cough - Immediate   Solid   GO    Solid: Not tested      Herbie Baltimore, MA CCC-SLP [phone]  Cru Kritikos, Katherene Ponto 09/04/2014,9:19 AM

## 2014-09-04 NOTE — Progress Notes (Signed)
Occupational Therapy Evaluation Patient Details Name: Steven David MRN: [id number] DOB: [birth year] Today's Date: 09/04/2014    History of Present Illness Pt is a 59 y/o male who lives at a group home, and has a PMH that includes schizophrenia. Pt was taken to the OR 8/22 with strangulated LIH, necrotic bowel which required sigmoid colectomy/colostomy. He was left intubated and was extubated 8/24.    Clinical Impression   Unsure of PLOF. Attempted to contact group home x 2 unsuccessfully. Pt currently max  A for all ADL and min A +2 for mobility. HR 151 with standing. Will follow pt acutely to facilitate safe D/C to next venue of care and maximize functional level of independence. Will continue to contact group home to establish PLOF.     Follow Up Recommendations  Supervision/Assistance - 24 hour;Home health OT    Equipment Recommendations  None recommended by OT    Recommendations for Other Services       Precautions / Restrictions Precautions Precautions: Fall Restrictions Weight Bearing Restrictions: No      Mobility Bed Mobility Overal bed mobility: Needs Assistance Bed Mobility: Supine to Sit;Sit to Supine     Supine to sit: Mod assist Sit to supine: Mod assist      Transfers Overall transfer level: Needs assistance Equipment used: Rolling walker (2 wheeled) Transfers: Sit to/from [facility] Sit to Stand: Min assist;+2 physical assistance Stand pivot transfers: Min assist       General transfer comment: Pt able to stand for peri care.     Balance     Sitting balance-Leahy Scale: Fair     Standing balance support: During functional activity Standing balance-Leahy Scale: Poor                              ADL Overall ADL's : Needs assistance/impaired                                     Functional mobility during ADLs: Minimal assistance General ADL Comments: Pt oerall ax A for ADL. Unsure of PLOF. Pt  incontinent of BM durng session requiring total A to clean up. Pt states caregivers assisted him with ADL at baseline. Attempted x 2 to call group home without answer.      Vision     Perception     Praxis      Pertinent Vitals/Pain Pain Assessment: Faces Faces Pain Scale: No hurt     Hand Dominance     Extremity/Trunk Assessment Upper Extremity Assessment Upper Extremity Assessment: Generalized weakness   Lower Extremity Assessment Lower Extremity Assessment: Generalized weakness   Cervical / Trunk Assessment Cervical / Trunk Assessment: Normal   Communication Communication Communication: Expressive difficulties   Cognition Arousal/Alertness: Awake/alert Behavior During Therapy: Flat affect Overall Cognitive Status: History of cognitive impairments - at baseline    Unsure of baseline. PO psych meds have been held therefore, unsure how much this is affecting movement and ability to complete ADL tasks.                 General Comments       Exercises       Shoulder Instructions      Home Living Family/patient expects to be discharged to:: Group home  Additional Comments: Attempted to contact group home regarding PLOF. No answer      Prior Functioning/Environment Level of Independence: Needs assistance        Comments: Per chart, pt ambulated and fed himself    OT Diagnosis: Generalized weakness   OT Problem List: Decreased strength;Decreased activity tolerance;Impaired balance (sitting and/or standing);Cardiopulmonary status limiting activity;Decreased safety awareness   OT Treatment/Interventions: Self-care/ADL training;DME and/or AE instruction;Therapeutic exercise;Therapeutic activities;Patient/family education;Balance training    OT Goals(Current goals can be found in the care plan section) Acute Rehab OT Goals Patient Stated Goal: Drink water OT Goal Formulation: Patient unable to participate in  goal setting Time For Goal Achievement: 09/18/14 Potential to Achieve Goals: Fair  OT Frequency: Min 2X/week   Barriers to D/C:            Co-evaluation              End of Session Equipment Utilized During Treatment: Oxygen Nurse Communication: Mobility status  Activity Tolerance: Patient tolerated treatment well Patient left: in bed;with call bell/phone within reach   Time: 0905-0934 OT Time Calculation (min): 29 min Charges:  OT General Charges $OT Visit: 1 Procedure OT Evaluation $Initial OT Evaluation Tier I: 1 Procedure OT Treatments $Self Care/Home Management : 8-22 mins G-Codes:    Dillon Mcreynolds,HILLARY 09-27-2014, 9:46 AM   Maurie Boettcher, OTR/L  [phone] 27-Sep-2014

## 2014-09-04 NOTE — Anesthesia Postprocedure Evaluation (Signed)
  Anesthesia Post-op Note  Patient: Steven David  Procedure(s) Performed: Procedure(s): EXPLORATORY LAPAROTOMY, SIGMOID COLECTOMY, SIGMOID COLOSTOMY, PRIMARY REPAIR OF STRANGULATED INGUINAL HERNIA. (N/A)  Patient Location: PACU and Nursing Unit  Anesthesia Type:General  Level of Consciousness: awake, alert  and patient cooperative  Airway and Oxygen Therapy: Patient Spontanous Breathing  Post-op Pain: none  Post-op Assessment: Post-op Vital signs reviewed, Patient's Cardiovascular Status Stable, Respiratory Function Stable, Patent Airway, No signs of Nausea or vomiting and Pain level controlled, still has difficulty swallowing LLE Motor Response: Purposeful movement   RLE Motor Response: Purposeful movement        Post-op Vital Signs: Reviewed and stable  Last Vitals:  Filed Vitals:   09/04/14 1700  BP: 115/63  Pulse: 94  Temp: 36.7 C  Resp: 14    Complications: No apparent anesthesia complications

## 2014-09-04 NOTE — Progress Notes (Signed)
09/04/2014 3:24 PM    CVC removed per K.Osborne PA-C request. Patient tolerated well.    Mick Sell RN

## 2014-09-04 NOTE — Progress Notes (Signed)
Spoke with Dr. Barry Dienes about [place] position. She said it was ok and to start TFs.

## 2014-09-04 NOTE — Progress Notes (Signed)
Initial Nutrition Assessment  DOCUMENTATION CODES:   Severe malnutrition in context of chronic illness  INTERVENTION:   Initiate TF via NGT/panda with Jevity 1.2 at 10 ml/h, increase by 10 ml every 12 hours to goal rate of 65 ml/h (1560 ml per day) to provide 1872 kcals, 87 gm protein, 1264 ml free water daily.  Monitor magnesium, potassium, and phosphorus daily for at least 3 days, MD to replete as needed, as pt is at risk for refeeding syndrome given severe PCM.  NUTRITION DIAGNOSIS:   Malnutrition related to chronic illness as evidenced by severe depletion of body fat, severe depletion of muscle mass.  GOAL:   Patient will meet greater than or equal to 90% of their needs  MONITOR:   TF tolerance, Labs, Weight trends  REASON FOR ASSESSMENT:   Consult Enteral/tube feeding initiation and management  ASSESSMENT:   59 y/o male who lives at a group home, and has a PMH that includes schizophrenia. Pt was taken to the OR 8/22 with strangulated LIH, necrotic bowel which required sigmoid colectomy/colostomy. He was left intubated and was extubated 8/24.   S/P swallow evaluation with SLP today; has profound dysphagia at baseline. Hx of dysphagia and aspiration and has previously been on a dysphagia 3 nectar diet with known risk of aspiration. Unsure of usual intake, but expect it is poor. Currently NPO. Per surgery team discussion with patient and his caretaker, they want him to receive a feeding tube. Small bore NGT (panda) to be placed by RN today for initiation of tube feeding. Plans for evaluation for a PEG next week.  Nutrition-Focused physical exam completed. Findings are severe fat depletion and severe muscle depletion. Patient with severe PCM.  Labs reviewed: potassium and phosphorus are low, magnesium WNL; need to monitor potassium, phosphorus, and magnesium to evaluate for refeeding syndrome. Patient at risk for refeeding syndrome due to severe PCM.  Diet Order:  Diet NPO  time specified  Skin:  Reviewed, no issues  Last BM:  8/26  Height:   Ht Readings from Last 1 Encounters:  09/01/14 5\' 6"  (1.676 m)    Weight:   Wt Readings from Last 1 Encounters:  09/02/14 128 lb 12 oz (58.4 kg)    Ideal Body Weight:  64.5 kg  BMI:  Body mass index is 20.79 kg/(m^2).  Estimated Nutritional Needs:   Kcal:  1800-2000  Protein:  85-100 gm  Fluid:  1.8-2 L  EDUCATION NEEDS:   No education needs identified at this time  Molli Barrows, [place], [place], [place] Pager [phone] After Hours Pager [phone]

## 2014-09-04 NOTE — Progress Notes (Signed)
**Note Steven-Identified via Obfuscation** Patient ID: Steven David, male   DOB: Sep 07, 1955, 59 y.o.   MRN: [id number] 3 Days Post-Op  Subjective: Hungry wants to eat.  His swallow ability is very poor.  He shows significant signs of aspiration regardless of thickening agent or not.  Had a BM from rectum this morning.   Objective: Vital signs in last 24 hours: Temp:  [97.5 F (36.4 C)-98.8 F (37.1 C)] 98.8 F (37.1 C) (08/26 0900) Pulse Rate:  [59-110] 74 (08/26 1000) Resp:  [11-29] 23 (08/26 1000) BP: (107-150)/(43-90) 137/85 mmHg (08/26 1000) SpO2:  [95 %-100 %] 100 % (08/26 1000) Arterial Line BP: (120-185)/(63-83) 169/78 mmHg (08/25 1600)    Intake/Output from previous day: 08/25 0701 - 08/26 0700 In: 3363.3 [P.O.:100; I.V.:2498.3; IV Piggyback:765] Out: 3125 [Urine:3125] Intake/Output this shift: Total I/O In: 300 [I.V.:300] Out: 600 [Urine:600]  PE: Abd: soft, appropriately tender, wound is clean and packed, ostomy with some air and old bloody drainage.  Stoma is viable Heart: regular  Lab Results:   Recent Labs  09/02/14 0021 09/03/14 0454  WBC 11.7* 10.9*  HGB 10.0* 9.5*  HCT 29.7* 28.9*  PLT 86* 79*   BMET  Recent Labs  09/03/14 2110 09/04/14 0520  NA 157* 155*  K 2.9* 3.4*  CL 128* 125*  CO2 23 25  GLUCOSE 145* 155*  BUN 12 10  CREATININE 0.83 0.83  CALCIUM 8.2* 8.0*   PT/INR No results for input(s): LABPROT, INR in the last 72 hours. CMP     Component Value Date/Time   NA 155* 09/04/2014 0520   K 3.4* 09/04/2014 0520   CL 125* 09/04/2014 0520   CO2 25 09/04/2014 0520   GLUCOSE 155* 09/04/2014 0520   BUN 10 09/04/2014 0520   CREATININE 0.83 09/04/2014 0520   CALCIUM 8.0* 09/04/2014 0520   PROT 4.6* 09/02/2014 0446   ALBUMIN 2.3* 09/02/2014 0930   AST 34 09/02/2014 0446   ALT 18 09/02/2014 0446   ALKPHOS 36* 09/02/2014 0446   BILITOT 1.2 09/02/2014 0446   GFRNONAA >60 09/04/2014 0520   GFRAA >60 09/04/2014 0520   Lipase     Component Value Date/Time   LIPASE 18  04/23/2013 1045       Studies/Results: Ct Head Wo Contrast  09/02/2014   CLINICAL DATA:  Seizures, history of schizophrenia  EXAM: CT HEAD WITHOUT CONTRAST  TECHNIQUE: Contiguous axial images were obtained from the base of the skull through the vertex without intravenous contrast.  COMPARISON:  04/11/2013  FINDINGS: No skull fracture is noted. Paranasal sinuses are unremarkable. The mastoid air cells are unremarkable. No intracranial hemorrhage, mass effect or midline shift. Mild cerebral atrophy. Patchy subcortical white matter decreased attenuation probable due to chronic small vessel ischemic changes. 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 Again noted cerebellar atrophy.  IMPRESSION: No acute intracranial abnormality. No significant change. Mild cerebral atrophy. Stable small lacunar infarct in left caudate nucleus.   Electronically Signed   By: Lahoma Crocker M.D.   On: 09/02/2014 17:07   Dg Chest Port 1 View  09/03/2014   ADDENDUM REPORT: 09/03/2014 15:22  ADDENDUM: Addendum is made as an image mismatch occurred. The following is the correct report for this patient.  Comparison made to prior study of 09/02/2014. Interim extubation removal of NG tube. Right IJ line in stable position. Cardiomegaly with mild pulmonary vascular prominence. Low lung volumes with new onset of bibasilar atelectasis and/or infiltrates. No pleural effusion or pneumothorax. Prominent skin fold on the right. Mild gastric  distention.  IMPRESSION: 1. Interim removal of endotracheal tube and NG tube. Right IJ line in stable position. 2. New onset bibasilar atelectasis and/or infiltrates. 3. Cardiomegaly with mild pulmonary vascular prominence. A mild component congestive heart failure cannot be excluded. 4. Mild gastric distention.   Electronically Signed   By: Marcello Moores  Register   On: 09/03/2014 15:22   09/03/2014   CLINICAL DATA:  Extubation.  EXAM: PORTABLE CHEST - 1 VIEW  COMPARISON:  09/01/2014.  FINDINGS: Interim  extubation and removal of NG tube and left IJ tube. Right IJ line noted with tip at cavoatrial junction. Cardiomegaly with continued improvement of pulmonary venous congestion and interstitial edema. Interval resolution of pleural effusions. No pneumothorax. Linear density along the right chest most likely represents a skin fold as lung markings are noted distal to this linear density. Mild gastric distention.  IMPRESSION: 1. Interim extubation and removal of NG tube and left IJ line. Right IJ line tip noted at cavoatrial junction.  2. Cardiomegaly with continued improvement of pulmonary vascular congestion and interstitial edema. Interim resolution of pleural effusions.  3. Gastric distention.  Electronically Signed: ByMarcello Moores  Register On: 09/03/2014 13:43    Anti-infectives: Anti-infectives    Start     Dose/Rate Route Frequency Ordered Stop   09/02/14 0930  cefTRIAXone (ROCEPHIN) 2 g in dextrose 5 % 50 mL IVPB  Status:  Discontinued     2 g 100 mL/hr over 30 Minutes Intravenous Every 24 hours 09/02/14 0833 09/03/14 0723   09/01/14 1600  cefoTEtan (CEFOTAN) 2 g in dextrose 5 % 50 mL IVPB     2 g 100 mL/hr over 30 Minutes Intravenous Every 12 hours 09/01/14 0705 09/02/14 0433   09/01/14 0330  cefOXitin (MEFOXIN) 2 g in dextrose 5 % 50 mL IVPB     2 g 100 mL/hr over 30 Minutes Intravenous  Once 09/01/14 0328 09/01/14 0433   09/01/14 0200  piperacillin-tazobactam (ZOSYN) IVPB 3.375 g     3.375 g 100 mL/hr over 30 Minutes Intravenous  Once 09/01/14 0156 09/01/14 0311       Assessment/Plan  POD 3, s/p ex lap with sigmoid colectomy, colostomy, repair of LIH, Dr. Donne Hazel -cont BID dressing changes -NPO due to dysphagia.  Will have a PANDA placed and start TFs through this.  Will have Dr. Grandville Silos evaluate next week for placement of a PEG tube.  Dr. Hulen Skains will look at him later today to see if he thinks this is a possibility for next week. -WOC consult for routine ostomy care, appreciate  their assistance -mobilize with PT, baseline uses a cane Steven David, caretaker confirmed this and says he does his ADLs essentially by himself as well.  Minimal assist) DVT Prophylaxis -SCDs -lovenox held due to general oozing and thrombocytopenia  Thrombocytopenia -appears chronic as he came in this way. Generally oozing. Hold chemical prophylaxis VDRF -extubated. Appreciate CCM assistance Schizophrenia -holdol held due to severe dysphagia.  Once we can hopefully get a g-tube then we can crush these and put them down his tube -appreciate psych assistance. psych did confirm to NOT restart lithium due to depakote, even at discharge Hypothyroidism -on IV synthroid for now New onset seizures -neurology on board. Appreciate their assitance -CT of his head negative, EEG normal -on depakote Severe dysphagia -after a length d/w his caretaker, Steven David, and the patient, they would like to attempt to move forward with a feeding tube.  Please see discussion above.  Will place PANDA in the mean  time for feedings until g-tube can hopefully be placed.   Hypernatremia -on 100cc/hr of free water.  Continue this and follow NA in am  Tx to tele floor on 6N today.   LOS: 3 days    Steven David E 09/04/2014, 10:39 AM Pager: [phone]

## 2014-09-04 NOTE — Clinical Social Work Note (Signed)
Clinical Social Work Assessment  Patient Details  Name: Steven David MRN: [id number] Date of Birth: 1955-09-10  Date of referral:  09/04/14               Reason for consult:   (Admitted from [facility] )                Permission sought to share information with:  Case Manager, Facility Sport and exercise psychologist Permission granted to share information::  Yes, Verbal Permission Granted  Name::      [place] )  Agency::   ([place] )  Relationship::   Conservation officer, historic buildings )  Contact Information:   [phone])  Housing/Transportation Living arrangements for the past 2 months:   ([place] ) Source of Information:  Facility Patient Interpreter Needed:  None Criminal Activity/Legal Involvement Pertinent to Current Situation/Hospitalization:  No - Comment as needed Significant Relationships:  None Lives with:  Facility Resident Do you feel safe going back to the place where you live?  Yes Need for family participation in patient care:  No (Coment)  Care giving concerns:  Patient requiring continued therapy with Home Health and 24 hour supervision/assistance.    Social Worker assessment / plan:  Holiday representative spoke with facility representative at length in reference to patient returning to family care home. Facility Rep confirmed that patient has been a resident for the past 10 years. CSW discussed patient's medical intervention here at [facility] and potential needs at discharge. Family care home able to accommodate pt's needs at discharge and confirmed that they do provide 24 hour supervision/assistance. Facility Rep also confirmed that pt can return with colostomy bag. CSW has informed RNCM of the above information. Patient to discharge back to [facility] with home health once medically stable for discharge. No further concerns reported by facility. CSW will sign off for now as social work intervention is no longer needed. Please consult  [place] again if new need arises.  [facility] uses [facility]. Physical Address: [address] [place], [place] [address]  Mailing Address: [address] [place],  [address] Phone: [phone]   Employment status:  Disabled (Comment on whether or not currently receiving Disability) Insurance information:  Medicare PT Recommendations:  Home with [place] / Referral to community resources:   (N/A)  Patient/Family's Response to care:  Patient being followed by psychiatry. Facility Rep agreeable to pt's return and indicated facility uses [place].   Patient/Family's Understanding of and Emotional Response to Diagnosis, Current Treatment, and Prognosis:  Unclear of pt's understanding of medical intervention and on-going treatment.   Emotional Assessment Appearance:  Appears stated age Attitude/Demeanor/Rapport:  Unable to Assess Affect (typically observed):  Unable to Assess Orientation:  Fluctuating Orientation (Suspected and/or reported [place]) Alcohol / Substance use:  Not Applicable Psych involvement (Current and /or in the community):  Yes (Comment)  Discharge Needs  Concerns to be addressed:  Basic Needs Readmission within the last 30 days:  No Current discharge risk:  Psychiatric Illness, Dependent with Mobility Barriers to Discharge:  Continued Medical Work up   [employer], MSW, [place] [phone] 09/04/2014 10:25 AM

## 2014-09-04 NOTE — Consult Note (Signed)
[facility] Face-to-Face Psychiatry Consult Follow up  Reason for Consult:  Schizophrenia medication management Referring Physician:  Dr. Chase Caller Patient Identification: Steven David MRN:  [id number] Principal Diagnosis: Schizophrenia Diagnosis:   Patient Active Problem List   Diagnosis Date Noted  . Seizure [R56.9]   . Incarcerated inguinal hernia [K40.30] 09/01/2014  . Respiratory failure, acute [J96.00] 09/01/2014  . Metabolic acidosis [C37.6] 09/01/2014  . Incarcerated hernia [K46.0] 09/01/2014  . Septic shock [A41.9, R65.21] 09/01/2014  . AKI (acute kidney injury) [N17.9]   . Protein-calorie malnutrition, severe [E43] 04/13/2013  . Sepsis [A41.9] 04/11/2013  . H/O stroke within last year [Z86.73] 04/11/2013  . Hypothyroidism [E03.9] 04/11/2013  . Pneumonia [J18.9] 04/11/2013  . Thrombocytopenia [D69.6] 04/11/2013  . Schizophrenia [F20.9] 04/11/2013  . Malnutrition [E46] 04/11/2013  . SIRS (systemic inflammatory response syndrome) [A41.9] 04/11/2013    Total Time spent with patient: 20 minutes  Subjective:   [name] is a 59 y.o. male patient admitted with strangulated left inguinal hernia and status post intubation.  HPI: Steven David is a 59 yo male, resident of group home member seen for the face to face psychiatric consultation and evaluation of schizophrenia and medication management. Patient is currently NPO. Patient is a poor historian and difficult to understand 50 % of language due to dysphasia.  He appeared staying in his bed, awake, alert and poorly oriented other than his name. Patient has no irritability, agitation or aggression at this time. He had one episode of seizure and neurologist cleared at this time due to no findings on his EEG. Patient home medication for psychosis was on hold due to NPO status. He is taking valproic acid and will start Geodon/Haldol IM PRN for possible agitation and aggression. He may start his oral medication as soon as NPO status  changes from Surgery. He is currently denied depression, anxiety and psychosis, SI/HI.   HPI Elements:   Location:  Schizophrenia. Quality:  poor. Severity:  status post surgery and intubation. Timing:  inguinal hernia repair. Duration:  few days. Context:  resident of group home.   Interval history: patient seen for psych consultation follow up. Patient appeared in his bed lying down with head end elevated. Staff RN at bed side stated that he is still not able to eat or drink well but able to use ice chips. Patient no changes in his mental status and continue to be poor communicator and slurred speech.   Past Medical History:  Past Medical History  Diagnosis Date  . Schizophrenia   . Hernia   . Thyroid disease   . High cholesterol   . HOH (hard of hearing)     Past Surgical History  Procedure Laterality Date  . Leg surgery    . Testicle surgery    . Laparotomy N/A 09/01/2014    Procedure: EXPLORATORY LAPAROTOMY, SIGMOID COLECTOMY, SIGMOID COLOSTOMY, PRIMARY REPAIR OF STRANGULATED INGUINAL HERNIA.;  Surgeon: Rolm Bookbinder, MD;  Location: [place];  Service: General;  Laterality: N/A;   Family History: No family history on file. Social History:  History  Alcohol Use No     History  Drug Use No    Social History   Social History  . Marital Status: Single    Spouse Name: N/A  . Number of Children: N/A  . Years of Education: N/A   Social History Main Topics  . Smoking status: Former Research scientist (life sciences)  . Smokeless tobacco: None  . Alcohol Use: No  . Drug Use: No  . Sexual  Activity: Not Asked   Other Topics Concern  . None   Social History Narrative   Additional Social History:                          Allergies:  No Known Allergies  Labs:  Results for orders placed or performed during the hospital encounter of 08/31/14 (from the past 48 hour(s))  CBC     Status: Abnormal   Collection Time: 09/03/14  4:54 AM  Result Value Ref Range   WBC 10.9 (H) 4.0 - 10.5  K/uL   RBC 3.30 (L) 4.22 - 5.81 MIL/uL   Hemoglobin 9.5 (L) 13.0 - 17.0 g/dL   HCT 28.9 (L) 39.0 - 52.0 %   MCV 87.6 78.0 - 100.0 fL   MCH 28.8 26.0 - 34.0 pg   MCHC 32.9 30.0 - 36.0 g/dL   RDW 15.5 11.5 - 15.5 %   Platelets 79 (L) 150 - 400 K/uL    Comment: CONSISTENT WITH PREVIOUS RESULT  Basic metabolic panel     Status: Abnormal   Collection Time: 09/03/14  4:54 AM  Result Value Ref Range   Sodium 157 (H) 135 - 145 mmol/L    Comment: DELTA CHECK NOTED   Potassium 3.4 (L) 3.5 - 5.1 mmol/L   Chloride 128 (H) 101 - 111 mmol/L   CO2 21 (L) 22 - 32 mmol/L   Glucose, Bld 90 65 - 99 mg/dL   BUN 16 6 - 20 mg/dL   Creatinine, Ser 0.97 0.61 - 1.24 mg/dL   Calcium 8.2 (L) 8.9 - 10.3 mg/dL   GFR calc non Af Amer >60 >60 mL/min   GFR calc Af Amer >60 >60 mL/min    Comment: (NOTE) The eGFR has been calculated using the CKD EPI equation. This calculation has not been validated in all clinical situations. eGFR's persistently <60 mL/min signify possible Chronic Kidney Disease.    Anion gap 8 5 - 15  Procalcitonin     Status: None   Collection Time: 09/03/14  4:54 AM  Result Value Ref Range   Procalcitonin 11.06 ng/mL    Comment:        Interpretation: PCT >= 10 ng/mL: Important systemic inflammatory response, almost exclusively due to severe bacterial sepsis or septic shock. (NOTE)         ICU PCT Algorithm               Non ICU PCT Algorithm    ----------------------------     ------------------------------         PCT < 0.25 ng/mL                 PCT < 0.1 ng/mL     Stopping of antibiotics            Stopping of antibiotics       strongly encouraged.               strongly encouraged.    ----------------------------     ------------------------------       PCT level decrease by               PCT < 0.25 ng/mL       >= 80% from peak PCT       OR PCT 0.25 - 0.5 ng/mL          Stopping of antibiotics  encouraged.     Stopping of  antibiotics           encouraged.    ----------------------------     ------------------------------       PCT level decrease by              PCT >= 0.25 ng/mL       < 80% from peak PCT        AND PCT >= 0.5 ng/mL             Continuing antibiotics                                              encouraged.       Continuing antibiotics            encouraged.    ----------------------------     ------------------------------     PCT level increase compared          PCT > 0.5 ng/mL         with peak PCT AND          PCT >= 0.5 ng/mL             Escalation of antibiotics                                          strongly encouraged.      Escalation of antibiotics        strongly encouraged.   Magnesium     Status: None   Collection Time: 09/03/14  4:54 AM  Result Value Ref Range   Magnesium 2.1 1.7 - 2.4 mg/dL  Phosphorus     Status: Abnormal   Collection Time: 09/03/14  4:54 AM  Result Value Ref Range   Phosphorus 1.5 (L) 2.5 - 4.6 mg/dL  TSH     Status: None   Collection Time: 09/03/14  4:54 AM  Result Value Ref Range   TSH 0.955 0.350 - 4.500 uIU/mL  MRSA PCR Screening     Status: None   Collection Time: 09/03/14  1:43 PM  Result Value Ref Range   MRSA by PCR NEGATIVE NEGATIVE    Comment:        The GeneXpert MRSA Assay (FDA approved for NASAL specimens only), is one component of a comprehensive MRSA colonization surveillance program. It is not intended to diagnose MRSA infection nor to guide or monitor treatment for MRSA infections.   Basic metabolic panel     Status: Abnormal   Collection Time: 09/03/14  9:10 PM  Result Value Ref Range   Sodium 157 (H) 135 - 145 mmol/L   Potassium 2.9 (L) 3.5 - 5.1 mmol/L   Chloride 128 (H) 101 - 111 mmol/L   CO2 23 22 - 32 mmol/L   Glucose, Bld 145 (H) 65 - 99 mg/dL   BUN 12 6 - 20 mg/dL   Creatinine, Ser 0.83 0.61 - 1.24 mg/dL   Calcium 8.2 (L) 8.9 - 10.3 mg/dL   GFR calc non Af Amer >60 >60 mL/min   GFR calc Af Amer >60 >60  mL/min    Comment: (NOTE) The eGFR has been calculated using the CKD EPI equation. This calculation has not been validated in all clinical situations. eGFR's persistently <60 mL/min  signify possible Chronic Kidney Disease.    Anion gap 6 5 - 15  Magnesium     Status: None   Collection Time: 09/04/14  5:20 AM  Result Value Ref Range   Magnesium 1.8 1.7 - 2.4 mg/dL  Phosphorus     Status: Abnormal   Collection Time: 09/04/14  5:20 AM  Result Value Ref Range   Phosphorus 1.7 (L) 2.5 - 4.6 mg/dL  Basic metabolic panel     Status: Abnormal   Collection Time: 09/04/14  5:20 AM  Result Value Ref Range   Sodium 155 (H) 135 - 145 mmol/L   Potassium 3.4 (L) 3.5 - 5.1 mmol/L   Chloride 125 (H) 101 - 111 mmol/L   CO2 25 22 - 32 mmol/L   Glucose, Bld 155 (H) 65 - 99 mg/dL   BUN 10 6 - 20 mg/dL   Creatinine, Ser 0.83 0.61 - 1.24 mg/dL   Calcium 8.0 (L) 8.9 - 10.3 mg/dL   GFR calc non Af Amer >60 >60 mL/min   GFR calc Af Amer >60 >60 mL/min    Comment: (NOTE) The eGFR has been calculated using the CKD EPI equation. This calculation has not been validated in all clinical situations. eGFR's persistently <60 mL/min signify possible Chronic Kidney Disease.    Anion gap 5 5 - 15    Vitals: Blood pressure 111/64, pulse 26, temperature 98.5 F (36.9 C), temperature source Oral, resp. rate 17, height 5' 6"  (1.676 m), weight 58.4 kg (128 lb 12 oz), SpO2 96 %.  Risk to Self: Is patient at risk for suicide?: No Risk to Others:   Prior Inpatient Therapy:   Prior Outpatient Therapy:    Current Facility-Administered Medications  Medication Dose Route Frequency Provider Last Rate Last Dose  . antiseptic oral rinse solution (CORINZ)  7 mL Mouth Rinse QID Saverio Danker, PA-C   7 mL at 09/04/14 0341  . chlorhexidine gluconate (PERIDEX) 0.12 % solution 15 mL  15 mL Mouth Rinse BID Saverio Danker, PA-C   15 mL at 09/04/14 0752  . dextrose 5 % solution   Intravenous Continuous Brand Males, MD 100  mL/hr at 09/04/14 1100    . fentaNYL (SUBLIMAZE) injection 50-200 mcg  50-200 mcg Intravenous Q2H PRN Brand Males, MD   50 mcg at 09/04/14 0330  . levothyroxine (SYNTHROID, LEVOTHROID) injection 50 mcg  50 mcg Intravenous Daily Saverio Danker, PA-C   50 mcg at 09/04/14 1055  . midazolam (VERSED) injection 2 mg  2 mg Intravenous Q15 min PRN Brand Males, MD   2 mg at 09/02/14 0243  . midazolam (VERSED) injection 2 mg  2 mg Intravenous Q2H PRN Brand Males, MD   2 mg at 09/01/14 2141  . ondansetron (ZOFRAN-ODT) disintegrating tablet 4 mg  4 mg Oral Q6H PRN Rolm Bookbinder, MD       Or  . ondansetron [facility]) injection 4 mg  4 mg Intravenous Q6H PRN Rolm Bookbinder, MD      . pantoprazole (PROTONIX) injection 40 mg  40 mg Intravenous Q24H Anders Simmonds, MD   40 mg at 09/04/14 8016  . silver nitrate applicators applicator 1 application  1 application Topical PRN Saverio Danker, PA-C      . valproate (DEPACON) 500 mg in dextrose 5 % 50 mL IVPB  500 mg Intravenous Q12H Greta Doom, MD   500 mg at 09/04/14 1039  . ziprasidone (GEODON) injection 10 mg  10 mg Intramuscular Q6H PRN Ambrose Finland, MD  Musculoskeletal: Strength & Muscle Tone: decreased Gait & Station: unable to stand Patient leans: N/A  Psychiatric Specialty Exam: Physical Exam .  ROS   Blood pressure 111/64, pulse 26, temperature 98.5 F (36.9 C), temperature source Oral, resp. rate 17, height 5' 6"  (1.676 m), weight 58.4 kg (128 lb 12 oz), SpO2 96 %.Body mass index is 20.79 kg/(m^2).  General Appearance: Disheveled and Guarded  Eye Contact::  Good  Speech:  Garbled and Slow  Volume:  Decreased  Mood:  Depressed  Affect:  Constricted and Depressed  Thought Process:  Disorganized and Irrelevant  Orientation:  Full (Time, Place, and Person)  Thought Content:  WDL  Suicidal Thoughts:  No  Homicidal Thoughts:  No  Memory:  Immediate;   Fair Recent;   Poor  Judgement:  Impaired   Insight:  Lacking  Psychomotor Activity:  Decreased  Concentration:  Fair  Recall:  Poor  Fund of Knowledge:Fair  Language: Fair  Akathisia:  Negative  Handed:  Right  AIMS (if indicated):     Assets:  Communication Skills Desire for Improvement Financial Resources/Insurance Housing Leisure Time Resilience Social Support  ADL's:  Impaired  Cognition: Impaired,  Mild  Sleep:      Medical Decision Making: New problem, with additional work up planned, Review of Psycho-Social Stressors (1), Review or order clinical lab tests (1), Review of Last Therapy Session (1), Review or order medicine tests (1), Review of Medication Regimen & Side Effects (2) and Review of New Medication or Change in Dosage (2)  Treatment Plan Summary: Daily contact with patient to assess and evaluate symptoms and progress in treatment and Medication management  Plan:  Case discussed with LCSW and will obtain collateral information from group home and [facility]. Continue Geodon 10 mg IM Q6H/PRN for agitation and aggression Continue Depacone 500 mg Q8H as scheduled and check valproic acid level May start home medication haldol 10 mg daily when his NPO status changed Check with [facility] regading his haldol depot 100 mg next due date.   Patient does not meet criteria for psychiatric inpatient admission. Supportive therapy provided about ongoing stressors.  Appreciate psychiatric consultation Please contact [phone] or [phone] if needs further assistance   Disposition: May discharge to group home when medically and surgically stable for out patient psychiatric services at [facility] recovery.   Tayten Bergdoll,JANARDHAHA R. 09/04/2014 1:11 PM

## 2014-09-04 NOTE — Progress Notes (Addendum)
Arrived to 6n19 from 8M. Alert, verbally hard to understand, bed alarm on

## 2014-09-04 NOTE — Progress Notes (Signed)
09/04/2014  1:41 PM   Attempted to place panda tube. Patient is cooperative, however, panda tube coils in mouth. K Maxwell Caul PA-C made aware. IR consulted for feeding tube placement. Patient remains NPO.     Mick Sell RN

## 2014-09-05 LAB — BASIC METABOLIC PANEL
ANION GAP: 4 — AB (ref 5–15)
BUN: 9 mg/dL (ref 6–20)
CO2: 28 mmol/L (ref 22–32)
Calcium: 7.9 mg/dL — ABNORMAL LOW (ref 8.9–10.3)
Chloride: 122 mmol/L — ABNORMAL HIGH (ref 101–111)
Creatinine, Ser: 0.75 mg/dL (ref 0.61–1.24)
GFR calc Af Amer: >60 mL/min (ref >60)
GFR calc non Af Amer: >60 mL/min (ref >60)
GLUCOSE: 125 mg/dL — AB (ref 65–99)
POTASSIUM: 2.9 mmol/L — AB (ref 3.5–5.1)
Sodium: 154 mmol/L — ABNORMAL HIGH (ref 135–145)

## 2014-09-05 LAB — CBC
HEMATOCRIT: 24.8 % — AB (ref 39.0–52.0)
HEMOGLOBIN: 8 g/dL — AB (ref 13.0–17.0)
MCH: 28.9 pg (ref 26.0–34.0)
MCHC: 32.3 g/dL (ref 30.0–36.0)
MCV: 89.5 fL (ref 78.0–100.0)
Platelets: 75 10*3/uL — ABNORMAL LOW (ref 150–400)
RBC: 2.77 MIL/uL — ABNORMAL LOW (ref 4.22–5.81)
RDW: 15.9 % — ABNORMAL HIGH (ref 11.5–15.5)
WBC: 7.1 10*3/uL (ref 4.0–10.5)

## 2014-09-05 LAB — GLUCOSE, CAPILLARY
GLUCOSE-CAPILLARY: 91 mg/dL (ref 65–99)
GLUCOSE-CAPILLARY: 108 mg/dL — AB (ref 65–99)
Glucose-Capillary: 102 mg/dL — ABNORMAL HIGH (ref 65–99)
Glucose-Capillary: 116 mg/dL — ABNORMAL HIGH (ref 65–99)
Glucose-Capillary: 135 mg/dL — ABNORMAL HIGH (ref 65–99)

## 2014-09-05 LAB — MAGNESIUM: Magnesium: 1.6 mg/dL — ABNORMAL LOW (ref 1.7–2.4)

## 2014-09-05 LAB — PHOSPHORUS: Phosphorus: 2 mg/dL — ABNORMAL LOW (ref 2.5–4.6)

## 2014-09-05 MED ORDER — POTASSIUM CHLORIDE 10 MEQ/100ML IV SOLN
10.0000 meq | INTRAVENOUS | Status: AC
  Administered 2014-09-05 (×2): 10 meq via INTRAVENOUS
  Filled 2014-09-05 (×3): qty 100

## 2014-09-05 MED ORDER — POTASSIUM CHLORIDE 10 MEQ/100ML IV SOLN
10.0000 meq | INTRAVENOUS | Status: AC
  Administered 2014-09-05 – 2014-09-06 (×2): 10 meq via INTRAVENOUS
  Filled 2014-09-05 (×2): qty 100

## 2014-09-05 MED ORDER — MAGNESIUM SULFATE 2 GM/50ML IV SOLN
2.0000 g | Freq: Once | INTRAVENOUS | Status: AC
  Administered 2014-09-05: 2 g via INTRAVENOUS
  Filled 2014-09-05: qty 50

## 2014-09-05 MED ORDER — CHLORHEXIDINE GLUCONATE 0.12 % MT SOLN
OROMUCOSAL | Status: AC
  Administered 2014-09-05: 15 mL
  Filled 2014-09-05: qty 15

## 2014-09-05 NOTE — Progress Notes (Signed)
SLP Cancellation Note  Patient Details Name: Steven David MRN: [id number] DOB: December 23, 1955   ST follow up for goals of care.  Note that the patient has agreed to a feeding tube.  Currently PANDA is in place with possible PEG tube this coming week.  ST will follow up following PEG placement to determine if any PO's might be appropriate.  D/W nursing who will continue with cold oral swabs to combat oral dryness.    Lamar Sprinkles 09/05/2014, 2:07 PM Shelly Flatten, [place], [facility] SLP [phone]

## 2014-09-05 NOTE — Progress Notes (Signed)
4 Days Post-Op  Subjective: Panda tube inserted and x-ray confirms that it is in the stomach.  Tube feeds initiated. No further seizures reported. Alert and in no distress.  Inappropriate. Sodium 154.  Potassium 2.9.  Creatinine 0.75.  BUN 9.  Glucose 125.  Phosphorus up to 2.0.  Magnesium 1.6.  Hemoglobin 8.0.  WBC 7100.  Objective: Vital signs in last 24 hours: Temp:  [97.3 F (36.3 C)-98.8 F (37.1 C)] 98.1 F (36.7 C) (08/27 0532) Pulse Rate:  [26-107] 66 (08/27 0532) Resp:  [14-23] 16 (08/27 0532) BP: (111-147)/(55-85) 127/55 mmHg (08/27 0532) SpO2:  [95 %-100 %] 95 % (08/27 0532) Weight:  [58.5 kg (128 lb 15.5 oz)] 58.5 kg (128 lb 15.5 oz) (08/27 0532)    Intake/Output from previous day: 08/26 0701 - 08/27 0700 In: 855 [I.V.:800; IV Piggyback:55] Out: 1050 [Urine:1050] Intake/Output this shift: Total I/O In: -  Out: 250 [Urine:250]  General appearance: Alert.  Inappropriate.  Minimally agitated.  No obvious distress. GI: Abdomen soft.  Minimally tender.  Wound clean and packed.  Ostomy with old bloody drainage but still stoma viable  Lab Results:  Results for orders placed or performed during the hospital encounter of 08/31/14 (from the past 24 hour(s))  Glucose, capillary     Status: Abnormal   Collection Time: 09/04/14  8:31 PM  Result Value Ref Range   Glucose-Capillary 129 (H) 65 - 99 mg/dL   Comment 1 Notify RN    Comment 2 Document in Chart   Glucose, capillary     Status: Abnormal   Collection Time: 09/05/14 12:03 AM  Result Value Ref Range   Glucose-Capillary 135 (H) 65 - 99 mg/dL   Comment 1 Notify RN    Comment 2 Document in Chart   Magnesium     Status: Abnormal   Collection Time: 09/05/14  3:12 AM  Result Value Ref Range   Magnesium 1.6 (L) 1.7 - 2.4 mg/dL  Phosphorus     Status: Abnormal   Collection Time: 09/05/14  3:12 AM  Result Value Ref Range   Phosphorus 2.0 (L) 2.5 - 4.6 mg/dL  Basic metabolic panel     Status: Abnormal   Collection  Time: 09/05/14  3:12 AM  Result Value Ref Range   Sodium 154 (H) 135 - 145 mmol/L   Potassium 2.9 (L) 3.5 - 5.1 mmol/L   Chloride 122 (H) 101 - 111 mmol/L   CO2 28 22 - 32 mmol/L   Glucose, Bld 125 (H) 65 - 99 mg/dL   BUN 9 6 - 20 mg/dL   Creatinine, Ser 0.75 0.61 - 1.24 mg/dL   Calcium 7.9 (L) 8.9 - 10.3 mg/dL   GFR calc non Af Amer >60 >60 mL/min   GFR calc Af Amer >60 >60 mL/min   Anion gap 4 (L) 5 - 15  CBC     Status: Abnormal   Collection Time: 09/05/14  3:12 AM  Result Value Ref Range   WBC 7.1 4.0 - 10.5 K/uL   RBC 2.77 (L) 4.22 - 5.81 MIL/uL   Hemoglobin 8.0 (L) 13.0 - 17.0 g/dL   HCT 24.8 (L) 39.0 - 52.0 %   MCV 89.5 78.0 - 100.0 fL   MCH 28.9 26.0 - 34.0 pg   MCHC 32.3 30.0 - 36.0 g/dL   RDW 15.9 (H) 11.5 - 15.5 %   Platelets 75 (L) 150 - 400 K/uL  Glucose, capillary     Status: Abnormal   Collection Time: 09/05/14  4:36 AM  Result Value Ref Range   Glucose-Capillary 116 (H) 65 - 99 mg/dL   Comment 1 Notify RN    Comment 2 Document in Chart      Studies/Results: Dg Abd 1 View  09/04/2014   CLINICAL DATA:  Status post nasogastric tube insertion.  Encounter for imaging study to confirm nasogastric (NG) tube placement Z01.89 (ICD-10-CM)  EXAM: ABDOMEN - 1 VIEW  COMPARISON:  None.  FINDINGS: Enteric tube curls within the stomach. Contrast has been injected which folds within the gastric fundus.  IMPRESSION: Enteric tube curls within the stomach.   Electronically Signed   By: Lajean Manes M.D.   On: 09/04/2014 18:12   Dg Addison Bailey G Tube Plc W/fl-no Rad  09/04/2014   CLINICAL DATA:    NASO G TUBE PLACEMENT WITH FLUORO  Fluoroscopy was utilized by the requesting physician.  No radiographic  interpretation.     Marland Kitchen antiseptic oral rinse  7 mL Mouth Rinse QID  . chlorhexidine gluconate  15 mL Mouth Rinse BID  . levothyroxine  50 mcg Intravenous Daily  . pantoprazole (PROTONIX) IV  40 mg Intravenous Q24H  . potassium chloride  10 mEq Intravenous Q1 Hr x 5  . valproate  sodium  500 mg Intravenous Q12H     Assessment/Plan: s/p Procedure(s): EXPLORATORY LAPAROTOMY, SIGMOID COLECTOMY, SIGMOID COLOSTOMY, PRIMARY REPAIR OF STRANGULATED INGUINAL HERNIA.  POD 4, s/p ex lap with sigmoid colectomy, colostomy, repair of LIH, Dr. Donne Hazel -cont BID dressing changes -NPO due to dysphagia. Panda placed and tube feedings going at 65 mL per hour.  Seems to be tolerating.  Will have Dr. Grandville Silos evaluate next week for placement of a PEG tube.  -WOC consult for routine ostomy care, appreciate their assistance -mobilize with PT, baseline uses a cane De Blanch, caretaker confirmed this and says he does his ADLs essentially by himself as well. Minimal assist) DVT Prophylaxis -SCDs -lovenox held due to general oozing and thrombocytopenia  Thrombocytopenia -appears chronic as he came in this way. Platelet count 75,000 today.  Generally oozing. Hold chemical prophylaxis VDRF Resolved. Appreciate CCM assistance Schizophrenia -holdol held due to severe dysphagia. Once we can hopefully get a g-tube then we can crush these and put them down his tube Defer medication selection and dosage for Panda tube to psychiatry -appreciate psych assistance. psych did confirm to NOT restart lithium due to depakote, even at discharge Hypothyroidism -on IV synthroid for now New onset seizures -neurology on board. Appreciate their assitance -CT of his head negative, EEG normal -on depakote Severe dysphagia -after a length d/w his caretaker, De Blanch, and the patient, they would like to attempt to move forward with a feeding tube. Please see discussion above. PANDA placed.  in the mean time for feedings until g-tube can hopefully be placed.  Hypernatremia -on 100cc/hr of free water. Continue this and follow NA in am Continue free water and D5W IV Check labs tomorrow Hypomagnesemia.  Will give 2 g of magnesium today.  Discussed with pharmacy Hypokalemia.  I ordered  potassium runs.   @PROBHOSP @  LOS: 4 days    Steven David M 09/05/2014  . .prob

## 2014-09-06 ENCOUNTER — Inpatient Hospital Stay (HOSPITAL_COMMUNITY): Payer: Medicare Other

## 2014-09-06 LAB — CULTURE, BLOOD (ROUTINE X 2)
CULTURE: NO GROWTH
Culture: NO GROWTH

## 2014-09-06 LAB — GLUCOSE, CAPILLARY
GLUCOSE-CAPILLARY: 103 mg/dL — AB (ref 65–99)
GLUCOSE-CAPILLARY: 106 mg/dL — AB (ref 65–99)
GLUCOSE-CAPILLARY: 119 mg/dL — AB (ref 65–99)
GLUCOSE-CAPILLARY: 128 mg/dL — AB (ref 65–99)
GLUCOSE-CAPILLARY: 128 mg/dL — AB (ref 65–99)
Glucose-Capillary: 103 mg/dL — ABNORMAL HIGH (ref 65–99)

## 2014-09-06 LAB — BASIC METABOLIC PANEL
Anion gap: 7 (ref 5–15)
BUN: 9 mg/dL (ref 6–20)
CHLORIDE: 116 mmol/L — AB (ref 101–111)
CO2: 30 mmol/L (ref 22–32)
CREATININE: 0.84 mg/dL (ref 0.61–1.24)
Calcium: 8.1 mg/dL — ABNORMAL LOW (ref 8.9–10.3)
GFR calc non Af Amer: >60 mL/min (ref >60)
Glucose, Bld: 126 mg/dL — ABNORMAL HIGH (ref 65–99)
POTASSIUM: 3.3 mmol/L — AB (ref 3.5–5.1)
SODIUM: 153 mmol/L — AB (ref 135–145)

## 2014-09-06 LAB — PHOSPHORUS: PHOSPHORUS: 2.6 mg/dL (ref 2.5–4.6)

## 2014-09-06 LAB — MAGNESIUM: MAGNESIUM: 2.1 mg/dL (ref 1.7–2.4)

## 2014-09-06 MED ORDER — POTASSIUM CHLORIDE 10 MEQ/100ML IV SOLN
10.0000 meq | INTRAVENOUS | Status: AC
  Administered 2014-09-06 (×4): 10 meq via INTRAVENOUS
  Filled 2014-09-06: qty 100

## 2014-09-06 NOTE — Progress Notes (Signed)
5 Days Post-Op  Subjective: More somnolent this morning.  No distress. Tolerating tube feedings.  Ostomy working.  No further seizures. Good urine output Sodium 153.  Potassium 3.3.  BUN 9.  Creatinine 0.84.  Glucose 126.  Phosphorus 2.6.  Magnesium 2.1  Objective: Vital signs in last 24 hours: Temp:  [97.3 F (36.3 C)-99 F (37.2 C)] 99 F (37.2 C) (08/28 0655) Pulse Rate:  [63-66] 63 (08/28 0655) Resp:  [16-62] 62 (08/28 0655) BP: (119-131)/(54-69) 119/69 mmHg (08/28 0655) SpO2:  [96 %-100 %] 96 % (08/28 0655) Weight:  [60 kg (132 lb 4.4 oz)-60.2 kg (132 lb 11.5 oz)] 60 kg (132 lb 4.4 oz) (08/27 1400) Last BM Date: 09/05/14  Intake/Output from previous day: 08/27 0701 - 08/28 0700 In: 2945 [I.V.:1602; NG/GT:1093; IV Piggyback:250] Out: 2552 [Urine:2550; Stool:2] Intake/Output this shift:   General appearance: Much more somnolent today.  Arousable.  Skin warm and dry.. No obvious distress. GI: Abdomen soft. Minimally tender. Wound clean and packed. Ostomy with old bloody drainage but stoma pink and viable Right scrotum edematous but reducible no hernia   Lab Results:  Results for orders placed or performed during the hospital encounter of 08/31/14 (from the past 24 hour(s))  Glucose, capillary     Status: Abnormal   Collection Time: 09/05/14 11:21 AM  Result Value Ref Range   Glucose-Capillary 102 (H) 65 - 99 mg/dL  Glucose, capillary     Status: None   Collection Time: 09/05/14  3:53 PM  Result Value Ref Range   Glucose-Capillary 91 65 - 99 mg/dL  Glucose, capillary     Status: Abnormal   Collection Time: 09/05/14  8:14 PM  Result Value Ref Range   Glucose-Capillary 108 (H) 65 - 99 mg/dL   Comment 1 Notify RN    Comment 2 Document in Chart   Glucose, capillary     Status: Abnormal   Collection Time: 09/06/14 12:15 AM  Result Value Ref Range   Glucose-Capillary 103 (H) 65 - 99 mg/dL   Comment 1 Notify RN    Comment 2 Document in Chart   Glucose, capillary      Status: Abnormal   Collection Time: 09/06/14  4:09 AM  Result Value Ref Range   Glucose-Capillary 119 (H) 65 - 99 mg/dL   Comment 1 Notify RN    Comment 2 Document in Chart   Basic metabolic panel     Status: Abnormal   Collection Time: 09/06/14  4:39 AM  Result Value Ref Range   Sodium 153 (H) 135 - 145 mmol/L   Potassium 3.3 (L) 3.5 - 5.1 mmol/L   Chloride 116 (H) 101 - 111 mmol/L   CO2 30 22 - 32 mmol/L   Glucose, Bld 126 (H) 65 - 99 mg/dL   BUN 9 6 - 20 mg/dL   Creatinine, Ser 0.84 0.61 - 1.24 mg/dL   Calcium 8.1 (L) 8.9 - 10.3 mg/dL   GFR calc non Af Amer >60 >60 mL/min   GFR calc Af Amer >60 >60 mL/min   Anion gap 7 5 - 15  Phosphorus     Status: None   Collection Time: 09/06/14  4:39 AM  Result Value Ref Range   Phosphorus 2.6 2.5 - 4.6 mg/dL  Magnesium     Status: None   Collection Time: 09/06/14  4:39 AM  Result Value Ref Range   Magnesium 2.1 1.7 - 2.4 mg/dL  Glucose, capillary     Status: Abnormal   Collection Time:  09/06/14  7:49 AM  Result Value Ref Range   Glucose-Capillary 128 (H) 65 - 99 mg/dL     Studies/Results: No results found.  Marland Kitchen antiseptic oral rinse  7 mL Mouth Rinse QID  . chlorhexidine gluconate  15 mL Mouth Rinse BID  . levothyroxine  50 mcg Intravenous Daily  . pantoprazole (PROTONIX) IV  40 mg Intravenous Q24H  . valproate sodium  500 mg Intravenous Q12H     Assessment/Plan: s/p Procedure(s): EXPLORATORY LAPAROTOMY, SIGMOID COLECTOMY, SIGMOID COLOSTOMY, PRIMARY REPAIR OF STRANGULATED INGUINAL HERNIA.  POD 5, s/p ex lap with sigmoid colectomy, colostomy, tissue repair (peritoneal closure only) of LIH, Dr. Donne Hazel -cont BID dressing changes -NPO due to dysphagia. Panda placed and tube feedings going at 65 mL per hour. Seems to be tolerating.  Will have Dr. Grandville Silos evaluate next week for placement of a PEG tube.  -WOC consult for routine ostomy care, appreciate their assistance -mobilize with PT, baseline uses a cane De Blanch, caretaker confirmed this and says he does his ADLs essentially by himself as well. Minimal assist) DVT Prophylaxis -SCDs -lovenox held due to general oozing and thrombocytopenia  Thrombocytopenia -appears chronic as he came in this way. Platelet count 75,000 today. Generally oozing. Hold chemical prophylaxis VDRF Resolved. Appreciate CCM assistance Schizophrenia -holdol held due to severe dysphagia. Once we can hopefully get a g-tube then we can crush these and put them down his tube Defer medication selection and dosage for Panda tube to psychiatry -appreciate psych assistance. psych did confirm to NOT restart lithium due to depakote, even at discharge Hypothyroidism -on IV synthroid for now New onset seizures -neurology on board. Appreciate their assitance -CT of his head negative, EEG normal -on depakote Severe dysphagia -after a length d/w his caretaker, De Blanch, and the patient, they would like to attempt to move forward with a feeding tube. Please see discussion above. PANDA placed. in the mean time for feedings until g-tube can hopefully be placed.  Hypernatremia -on 100cc/hr of free water via Panda tube every 4 hours.  Will continue this 30 doses. Continue free water and D5W IV Check labs tomorrow Hypomagnesemia.2 g of magnesium yesterday.. Magnesium now normalized Hypokalemia. I ordered more potassium runs.  @PROBHOSP @  LOS: 5 days    Steven David 09/06/2014  . .prob

## 2014-09-07 ENCOUNTER — Encounter (HOSPITAL_COMMUNITY): Payer: Self-pay | Admitting: Anesthesiology

## 2014-09-07 DIAGNOSIS — D62 Acute posthemorrhagic anemia: Secondary | ICD-10-CM | POA: Diagnosis not present

## 2014-09-07 LAB — GLUCOSE, CAPILLARY
GLUCOSE-CAPILLARY: 123 mg/dL — AB (ref 65–99)
GLUCOSE-CAPILLARY: 126 mg/dL — AB (ref 65–99)
Glucose-Capillary: 114 mg/dL — ABNORMAL HIGH (ref 65–99)
Glucose-Capillary: 107 mg/dL — ABNORMAL HIGH (ref 65–99)
Glucose-Capillary: 134 mg/dL — ABNORMAL HIGH (ref 65–99)
Glucose-Capillary: 117 mg/dL — ABNORMAL HIGH (ref 65–99)
Glucose-Capillary: 133 mg/dL — ABNORMAL HIGH (ref 65–99)

## 2014-09-07 LAB — BASIC METABOLIC PANEL
Anion gap: 6 (ref 5–15)
BUN: 8 mg/dL (ref 6–20)
CHLORIDE: 114 mmol/L — AB (ref 101–111)
CO2: 29 mmol/L (ref 22–32)
CREATININE: 0.68 mg/dL (ref 0.61–1.24)
Calcium: 7.7 mg/dL — ABNORMAL LOW (ref 8.9–10.3)
GFR calc non Af Amer: >60 mL/min (ref >60)
Glucose, Bld: 147 mg/dL — ABNORMAL HIGH (ref 65–99)
POTASSIUM: 3.4 mmol/L — AB (ref 3.5–5.1)
SODIUM: 149 mmol/L — AB (ref 135–145)

## 2014-09-07 MED ORDER — CEFAZOLIN SODIUM-DEXTROSE 2-3 GM-% IV SOLR
2.0000 g | INTRAVENOUS | Status: AC
  Administered 2014-09-08: 2 g via INTRAVENOUS
  Filled 2014-09-07 (×2): qty 50

## 2014-09-07 MED ORDER — POTASSIUM CHLORIDE 10 MEQ/100ML IV SOLN
10.0000 meq | INTRAVENOUS | Status: AC
  Administered 2014-09-07 (×4): 10 meq via INTRAVENOUS
  Filled 2014-09-07 (×4): qty 100

## 2014-09-07 NOTE — Care Management Important Message (Signed)
Important Message  Patient Details  Name: Steven David MRN: [id number] Date of Birth: [birth year]   Medicare Important Message Given:  Yes-second notification given    Delorse Lek 09/07/2014, 10:24 AM

## 2014-09-07 NOTE — Progress Notes (Signed)
Patient ID: Steven David, male   DOB: [birth year], 57 y.o.   MRN: [id number] Procedure, risks, and benefits of PEG D/W his brother, Broadus John, by phone and consent obtained. Georganna Skeans, MD, MPH, FACS Trauma: [phone] General Surgery: [phone]

## 2014-09-07 NOTE — Progress Notes (Signed)
[facility] Progress Note  6 Days Post-Op  Subjective: Pt doing well.  No N/V.  NPO due to dysphagia on Tube feedings through PANDA.  He's at 25mL/hr.  Wants ice water.  Denies pain.  Ostomy functioning well and is full.  Objective: Vital signs in last 24 hours: Temp:  [97.6 F (36.4 C)-98.5 F (36.9 C)] 97.8 F (36.6 C) (08/29 0622) Pulse Rate:  [73-84] 73 (08/29 0622) Resp:  [16-20] 16 (08/29 0622) BP: (114-131)/(56-72) 114/59 mmHg (08/29 0622) SpO2:  [96 %-100 %] 98 % (08/29 0622) Weight:  [59.285 kg (130 lb 11.2 oz)-61.2 kg (134 lb 14.7 oz)] 59.285 kg (130 lb 11.2 oz) (08/29 0622) Last BM Date: 09/06/14  Intake/Output from previous day: 08/28 0701 - 08/29 0700 In: 3452 [I.V.:2330; NG/GT:322] Out: 1900 [Urine:1900] Intake/Output this shift:    PE: Gen:  Alert, NAD, pleasant Card:  RRR, no M/G/R heard Pulm:  CTA, no W/R/R Abd: Soft, NT/ND, +BS, no HSM, narrow open wound beefy red, a little oozy, incisions C/D/I, ostomy pink with lots of brown stool/flatus (full)   Lab Results:   Recent Labs  09/05/14 0312  WBC 7.1  HGB 8.0*  HCT 24.8*  PLT 75*   BMET  Recent Labs  09/06/14 0439 09/07/14 0301  NA 153* 149*  K 3.3* 3.4*  CL 116* 114*  CO2 30 29  GLUCOSE 126* 147*  BUN 9 8  CREATININE 0.84 0.68  CALCIUM 8.1* 7.7*   PT/INR No results for input(s): LABPROT, INR in the last 72 hours. CMP     Component Value Date/Time   NA 149* 09/07/2014 0301   K 3.4* 09/07/2014 0301   CL 114* 09/07/2014 0301   CO2 29 09/07/2014 0301   GLUCOSE 147* 09/07/2014 0301   BUN 8 09/07/2014 0301   CREATININE 0.68 09/07/2014 0301   CALCIUM 7.7* 09/07/2014 0301   PROT 4.6* 09/02/2014 0446   ALBUMIN 2.3* 09/02/2014 0930   AST 34 09/02/2014 0446   ALT 18 09/02/2014 0446   ALKPHOS 36* 09/02/2014 0446   BILITOT 1.2 09/02/2014 0446   GFRNONAA >60 09/07/2014 0301   GFRAA >60 09/07/2014 0301   Lipase     Component Value Date/Time   LIPASE 18 04/23/2013 1045        Studies/Results: Dg Abd Portable 1v  09/06/2014   CLINICAL DATA:  NG tube placement  EXAM: PORTABLE ABDOMEN - 1 VIEW  COMPARISON:  09/04/2014  FINDINGS: The enteric tube extends into the stomach and continues to the expected location of the second portion of the duodenum.  IMPRESSION: Enteric tube extends to the expected location of the duodenal second portion.   Electronically Signed   By: Andreas Newport M.D.   On: 09/06/2014 21:03    Anti-infectives: Anti-infectives    Start     Dose/Rate Route Frequency Ordered Stop   09/02/14 0930  cefTRIAXone (ROCEPHIN) 2 g in dextrose 5 % 50 mL IVPB  Status:  Discontinued     2 g 100 mL/hr over 30 Minutes Intravenous Every 24 hours 09/02/14 0833 09/03/14 0723   09/01/14 1600  cefoTEtan (CEFOTAN) 2 g in dextrose 5 % 50 mL IVPB     2 g 100 mL/hr over 30 Minutes Intravenous Every 12 hours 09/01/14 0705 09/02/14 0433   09/01/14 0330  cefOXitin (MEFOXIN) 2 g in dextrose 5 % 50 mL IVPB     2 g 100 mL/hr over 30 Minutes Intravenous  Once 09/01/14 0328 09/01/14 0433   09/01/14 0200  piperacillin-tazobactam (ZOSYN) IVPB 3.375 g     3.375 g 100 mL/hr over 30 Minutes Intravenous  Once 09/01/14 0156 09/01/14 0311       Assessment/Plan POD #6, S/p ex lap with sigmoid colectomy, colostomy, tissue repair (peritoneal closure only) of LIH, Dr. Donne Hazel -cont BID dressing changes -NPO due to dysphagia. Panda placed and tube feedings going at 65 mL per hour. Seems to be tolerating.  Will have Dr. Grandville Silos evaluate next week for placement of a PEG tube.  -WOC consult for routine ostomy care, appreciate their assistance -mobilize with PT, baseline uses a cane/minimal assist at home DVT Prophylaxis -SCDs -lovenox held due to general oozing and thrombocytopenia  ABL Anemia & Thrombocytopenia -appears chronic as he came in this way. Platelet count 75,000 on 8/27. Check CBC in am.  Generally oozing. Hold chemical  prophylaxis VDRF Resolved. Appreciate CCM assistance Schizophrenia -Holdol held due to severe dysphagia. On Geodon PRN while NPO.  Once we can hopefully get a g-tube then we can crush these and put them down his tube -Defer medication selection and dosage for Panda tube to psychiatry -Appreciate psych assistance. psych did confirm to NOT restart lithium due to depakote, even at discharge Hypothyroidism -on IV synthroid for now New onset seizures -neurology on board. Appreciate their assitance -CT of his head negative, EEG normal -on depakote Severe dysphagia -After a length d/w his caretaker, De Blanch, and the patient, they would like to attempt to move forward with a PEG -PANDA for now, hopefully PEG tomorrow or Wednesday -TF at 52mL/hr, advancing to goal of 61mL/hr.  Eventually we can get him on cyclical TF's.  He will need [place] services. Hypernatremia -on 100cc/hr of free water via Panda tube every 4 hours. Will continue this 30 doses. -Continue free water and D5W IV -Check labs tomorrow Hypomagnesemia.Magnesium now normalized Hypokalemia. 4 more potassium runs    LOS: 6 days    Nat Christen 09/07/2014, 9:14 AM Pager: [phone]

## 2014-09-07 NOTE — Clinical Documentation Improvement (Signed)
General Surgery  Please specify diagnosis related to below supporting information, if appropriate.    Acute Blood Loss Anemia, including the suspected or known cause or associated condition(s)  Acute on chronic blood loss anemia, including the suspected or known cause or associated condition(s)  Chronic blood loss anemia, including the suspected or known cause or associated condition(s)  Precipitous drop in Hematocrit, including the suspected or known cause or associated condition(s)  Other  Clinically Undetermined  Document any associated diagnoses/conditions.   Supporting Information: Per 09/06/14 MD progress note = More somnolent this morning.    Patient's labs during this admission  Component     Latest Ref Rng 08/31/2014 09/01/2014          4:50 AM  Hemoglobin     13.0 - 17.0 g/dL 13.4 10.2 (L)  HCT     39.0 - 52.0 % 40.7 30.0 (L)   Component     Latest Ref Rng 09/01/2014         9:00 AM  Hemoglobin     13.0 - 17.0 g/dL 9.8 (L)  HCT     39.0 - 52.0 % 29.7 (L)   Component     Latest Ref Rng 09/01/2014         4:25 PM  Hemoglobin     13.0 - 17.0 g/dL 10.3 (L)  HCT     39.0 - 52.0 % 31.0 (L)   Component     Latest Ref Rng 09/02/2014        12:21 AM  Hemoglobin     13.0 - 17.0 g/dL 10.0 (L)  HCT     39.0 - 52.0 % 29.7 (L)   Component     Latest Ref Rng 09/03/2014         4:54 AM  Hemoglobin     13.0 - 17.0 g/dL 9.5 (L)  HCT     39.0 - 52.0 % 28.9 (L)   Component     Latest Ref Rng 09/05/2014         3:12 AM  Hemoglobin     13.0 - 17.0 g/dL 8.0 (L)  HCT     39.0 - 52.0 % 24.8 (L)     Please exercise your independent, professional judgment when responding. A specific answer is not anticipated or expected.   Thank You,  Sonda Rumble RN, BSN, CCM Clinical Documentation Specialist Phone: [place]

## 2014-09-07 NOTE — Consult Note (Addendum)
WOC ostomy follow-up consult note [facility] following for assessment and plan of care to abd wound. Stoma type/location: Colostomy to LLQ Stomal assessment/size: Stoma is red and viable, above skin level, 1 3/4 inches Peristomal assessment: Intact skin surrounding  Treatment options for stomal/peristomal skin: Applied barrier ring Output: Mod amt semi-formed brown stool in the pouch. Ostomy pouching: 1pc.  Education provided: Demonstrated pouch change with one piece pouch with barrier ring to maintain seal.  Pt did not watch, participate, or ask questions.  He does not appear to understand the function of the pouch. No family members at the bedside.  He will need total assistance with pouch application and emptying after discharge at this point. Supplies ordered to the room for staff nurse use. Enrolled patient in [place] program: No Julien Girt MSN, [place], [place], [place], [place]

## 2014-09-07 NOTE — Progress Notes (Signed)
Physical Therapy Treatment Patient Details Name: Steven David MRN: [id number] DOB: [birth year] Today's Date: 09/07/2014    History of Present Illness Pt is a 59 y/o male who lives at a group home, and has a PMH that includes schizophrenia. Pt was taken to the OR 8/22 with strangulated LIH, necrotic bowel which required sigmoid colectomy/colostomy. He was left intubated and was extubated 8/24.     PT Comments    Pt was able to tolerate ambulation this date with maximal encouragement and minAx2 for safety. Pt con't to require 24/7 assist upon d/c. If group home can provide this level of care he is safe to d/c there once medically stable.  Follow Up Recommendations  Home health PT;Supervision/Assistance - 24 hour     Equipment Recommendations  Rolling walker with 5" wheels    Recommendations for Other Services       Precautions / Restrictions Precautions Precautions: Fall Precaution Comments: NG tube, colostomy Restrictions Weight Bearing Restrictions: No    Mobility  Bed Mobility Overal bed mobility: Needs Assistance Bed Mobility: Supine to Sit     Supine to sit: Mod assist     General bed mobility comments: max directional v/c's, definite use of hands, assist for turnk elevation  Transfers Overall transfer level: Needs assistance Equipment used: Rolling walker (2 wheeled) Transfers: Sit to/from Stand Sit to Stand: Min assist         General transfer comment: max v/c's for safety and hand placement, increased time  Ambulation/Gait Ambulation/Gait assistance: Min assist;+2 safety/equipment Ambulation Distance (Feet): 50 Feet Assistive device: Rolling walker (2 wheeled) Gait Pattern/deviations: Step-through pattern;Decreased stride length;Staggering left;Narrow base of support Gait velocity: slow Gait velocity interpretation: <1.8 ft/sec, indicative of risk for recurrent falls General Gait Details: pt required assist for safe RW management, max encouragment to  increase ambualtion distance. Pt with strong vearing to the Left   Stairs            Wheelchair Mobility    Modified Rankin (Stroke Patients Only)       Balance Overall balance assessment: Needs assistance Sitting-balance support: Feet supported Sitting balance-Leahy Scale: Fair     Standing balance support: During functional activity;Bilateral upper extremity supported Standing balance-Leahy Scale: Poor                      Cognition Arousal/Alertness: Awake/alert Behavior During Therapy: Flat affect Overall Cognitive Status: History of cognitive impairments - at baseline                      Exercises      General Comments General comments (skin integrity, edema, etc.): pt dependent for pericare s/p BM, assist for logrolling side/side      Pertinent Vitals/Pain Pain Assessment: No/denies pain    Home Living                      Prior Function            PT Goals (current goals can now be found in the care plan section) Acute Rehab PT Goals Patient Stated Goal: drink water Progress towards PT goals: Progressing toward goals    Frequency  Min 3X/week    PT Plan Current plan remains appropriate    Co-evaluation             End of Session Equipment Utilized During Treatment: Gait belt Activity Tolerance: Patient limited by fatigue Patient left: in chair;with call bell/phone within reach;with  chair alarm set     Time: [phone] PT Time Calculation (min) (ACUTE ONLY): 24 min  Charges:  $Gait Training: 8-22 mins $Therapeutic Activity: 8-22 mins                    G Codes:      Steven David 09/07/2014, 10:46 AM   Kittie Plater, PT, DPT Pager #: [phone] Office #: [phone]

## 2014-09-07 NOTE — Progress Notes (Signed)
MEDICATION RELATED CONSULT NOTE - INITIAL   Pharmacy Consult for IV Valproic Acid Indication: Schizophrenia, seizures   No Known Allergies  Patient Measurements: Height: 5\' 6"  (167.6 cm) Weight: 130 lb 11.2 oz (59.285 kg) IBW/kg (Calculated) : 63.8 Adjusted Body Weight:   Vital Signs: Temp: 97.8 F (36.6 C) (08/29 0622) Temp Source: Axillary (08/29 0622) BP: 114/59 mmHg (08/29 0622) Pulse Rate: 73 (08/29 0622) Intake/Output from previous day: 08/28 0701 - 08/29 0700 In: 3452 [I.V.:2330; NG/GT:322] Out: 1900 [Urine:1900] Intake/Output from this shift: Total I/O In: -  Out: 150 [Stool:150]  Labs:  Recent Labs  09/05/14 0312 09/06/14 0439 09/07/14 0301  WBC 7.1  --   --   HGB 8.0*  --   --   HCT 24.8*  --   --   PLT 75*  --   --   CREATININE 0.75 0.84 0.68  MG 1.6* 2.1  --   PHOS 2.0* 2.6  --    Estimated Creatinine Clearance: 85.4 mL/min (by C-G formula based on Cr of 0.68).   Microbiology: Recent Results (from the past 720 hour(s))  Culture, blood (routine x 2)     Status: None   Collection Time: 09/01/14  3:16 PM  Result Value Ref Range Status   Specimen Description BLOOD RIGHT HAND  Final   Special Requests IN PEDIATRIC BOTTLE 2CC  Final   Culture NO GROWTH 5 DAYS  Final   Report Status 09/06/2014 FINAL  Final  Culture, blood (routine x 2)     Status: None   Collection Time: 09/01/14  3:30 PM  Result Value Ref Range Status   Specimen Description BLOOD LEFT HAND  Final   Special Requests IN PEDIATRIC BOTTLE 3CC  Final   Culture NO GROWTH 5 DAYS  Final   Report Status 09/06/2014 FINAL  Final  MRSA PCR Screening     Status: None   Collection Time: 09/03/14  1:43 PM  Result Value Ref Range Status   MRSA by PCR NEGATIVE NEGATIVE Final    Comment:        The GeneXpert MRSA Assay (FDA approved for NASAL specimens only), is one component of a comprehensive MRSA colonization surveillance program. It is not intended to diagnose MRSA infection nor to  guide or monitor treatment for MRSA infections.     Medical History: Past Medical History  Diagnosis Date  . Schizophrenia   . Hernia   . Thyroid disease   . High cholesterol   . HOH (hard of hearing)     Medications:  Prescriptions prior to admission  Medication Sig Dispense Refill Last Dose  . docusate sodium (COLACE) 100 MG capsule Take 100 mg by mouth at bedtime as needed for mild constipation.   unknown  . feeding supplement, ENSURE, (ENSURE) PUDG Take 1 Container by mouth 3 (three) times daily between meals. 90 Container 5 unknown  . fish oil-omega-3 fatty acids 1000 MG capsule Take 1 g by mouth 3 (three) times daily.   08/31/2014 at Unknown time  . guaifenesin (ROBITUSSIN) 100 MG/5ML syrup Take 200 mg by mouth every 4 (four) hours as needed for cough.   unknown  . haloperidol (HALDOL) 10 MG tablet Take 10 mg by mouth every evening.   08/30/2014 at Unknown time  . haloperidol decanoate (HALDOL DECANOATE) 100 MG/ML injection Inject 100 mg into the muscle every 28 (twenty-eight) days. Administered by [facility]   Past Month at Unknown time  . levothyroxine (SYNTHROID, LEVOTHROID) 100 MCG tablet Take 100 mcg  by mouth daily before breakfast.   08/31/2014 at Unknown time  . lithium carbonate 300 MG capsule Take 300 mg by mouth 2 (two) times daily.   08/31/2014 at Unknown time  . loratadine (CLARITIN) 10 MG tablet Take 10 mg by mouth daily.   08/31/2014 at Unknown time  . lovastatin (MEVACOR) 20 MG tablet Take 20 mg by mouth daily.   08/31/2014 at Unknown time  . Maltodextrin-Xanthan Gum (RESOURCE THICKENUP CLEAR) POWD Take 1 Container by mouth as needed (Used to make liquids nectar thick). 1 Can 12 unknown  . oxybutynin (DITROPAN-XL) 10 MG 24 hr tablet Take 10 mg by mouth daily.   08/31/2014 at Unknown time  . polyethylene glycol powder (GLYCOLAX/MIRALAX) powder Take 17 g by mouth daily. Dissolved in 8 ounces of water/juice daily   08/31/2014 at Unknown time    Assessment: Emesis 59 y/o M  from group home presents with N/V and lower abdominal pain.   8/22: strangulated LIH, necrotic bowel which required, Sigmoid colectomy/colostomy. 8/24: Seizures x 2  He has severe dysphagia and is on tube feeding by [facility]. Will need evaluation for PEG tube. Patient has a h/o schizophrenia. Oral haldol is on hold, psych does not want to restart lithium, and has placed patient on IV VA with pharmacy to help with monitoring levels. Patient should reach steady state in 2-4 days (no liver dysfunction) and has been on it since 8/25 PM)   Goal of Therapy:  Valproic acid level 50-100 mcg/ml  Plan:  - Currently on IV VA 500mg  q12 hrs (Psych note says q8h??) which is 17mg /kg/day. This dose is adequate for monotherapy initiation with seizures. - May increase dosage 5 to 10 mg/kg/day at 1-week intervals to achieve optimal clinical response (MAX 60 mg/kg/day or less with a therapeutic range of 50 to 100 mcg/mL)  --When converting from valproic acid (IV): initiate divalproex sodium sprinkle capsules at the same daily dose and dosing schedule; once stabilized, divalproex sodium may be given 2 or 3 times/day - This could be given via panda tube now if desired. - Level in AM and will adjust dose if needed   Steven David S. Alford Highland, PharmD, BCPS Clinical Staff Pharmacist Pager [phone]  Steven David 09/07/2014,10:26 AM

## 2014-09-07 NOTE — Clinical Documentation Improvement (Signed)
General Surgery  Please specify diagnosis related to below supporting information, if appropriate.    Severe malnutrition  Other condition  Unable to clinically determine  Document any associated diagnoses/conditions   Supporting Information: :  Per 09/04/14 RD progress note = Severe malnutrition in context of chronic illness.   Malnutrition related to chronic illness as evidenced by severe depletion of body fat, severe depletion of muscle mass.   Please exercise your independent, professional judgment when responding. A specific answer is not anticipated or expected.   Thank You, Sonda Rumble RN, BSN, CCM Clinical Documentation Specialist Phone: [phone] [place]

## 2014-09-08 ENCOUNTER — Encounter (HOSPITAL_COMMUNITY): Admission: EM | Disposition: A | Discharge status: Home Health | Payer: Self-pay | Source: Home / Self Care

## 2014-09-08 ENCOUNTER — Encounter (HOSPITAL_COMMUNITY): Payer: Self-pay | Admitting: *Deleted

## 2014-09-08 ENCOUNTER — Inpatient Hospital Stay (HOSPITAL_COMMUNITY): Payer: Medicare Other | Admitting: Anesthesiology

## 2014-09-08 HISTORY — PX: ESOPHAGOGASTRODUODENOSCOPY (EGD) WITH PROPOFOL: SHX5813

## 2014-09-08 HISTORY — PX: PEG PLACEMENT: SHX5437

## 2014-09-08 LAB — GLUCOSE, CAPILLARY
GLUCOSE-CAPILLARY: 89 mg/dL (ref 65–99)
GLUCOSE-CAPILLARY: 113 mg/dL — AB (ref 65–99)
Glucose-Capillary: 74 mg/dL (ref 65–99)
Glucose-Capillary: 92 mg/dL (ref 65–99)
Glucose-Capillary: 87 mg/dL (ref 65–99)
Glucose-Capillary: 100 mg/dL — ABNORMAL HIGH (ref 65–99)

## 2014-09-08 LAB — BASIC METABOLIC PANEL
Anion gap: 6 (ref 5–15)
BUN: 7 mg/dL (ref 6–20)
CALCIUM: 7.7 mg/dL — AB (ref 8.9–10.3)
CO2: 32 mmol/L (ref 22–32)
CREATININE: 0.75 mg/dL (ref 0.61–1.24)
Chloride: 110 mmol/L (ref 101–111)
GFR calc Af Amer: >60 mL/min (ref >60)
GFR calc non Af Amer: >60 mL/min (ref >60)
GLUCOSE: 96 mg/dL (ref 65–99)
Potassium: 3.7 mmol/L (ref 3.5–5.1)
Sodium: 148 mmol/L — ABNORMAL HIGH (ref 135–145)

## 2014-09-08 LAB — CBC
HCT: 29.4 % — ABNORMAL LOW (ref 39.0–52.0)
Hemoglobin: 9.1 g/dL — ABNORMAL LOW (ref 13.0–17.0)
MCH: 28.4 pg (ref 26.0–34.0)
MCHC: 31 g/dL (ref 30.0–36.0)
MCV: 91.9 fL (ref 78.0–100.0)
PLATELETS: 92 10*3/uL — AB (ref 150–400)
RBC: 3.2 MIL/uL — ABNORMAL LOW (ref 4.22–5.81)
RDW: 15.3 % (ref 11.5–15.5)
WBC: 11.2 10*3/uL — ABNORMAL HIGH (ref 4.0–10.5)

## 2014-09-08 LAB — VALPROIC ACID LEVEL: VALPROIC ACID LVL: 72 ug/mL (ref 50.0–100.0)

## 2014-09-08 SURGERY — ESOPHAGOGASTRODUODENOSCOPY (EGD) WITH PROPOFOL
Anesthesia: General

## 2014-09-08 MED ORDER — LACTATED RINGERS IV SOLN
INTRAVENOUS | Status: DC | PRN
  Administered 2014-09-08: 10:00:00 via INTRAVENOUS

## 2014-09-08 MED ORDER — LACTATED RINGERS IV SOLN
INTRAVENOUS | Status: DC
  Administered 2014-09-08: 10:00:00 via INTRAVENOUS

## 2014-09-08 MED ORDER — PROPOFOL 10 MG/ML IV BOLUS
INTRAVENOUS | Status: DC | PRN
  Administered 2014-09-08: 25 mg via INTRAVENOUS

## 2014-09-08 MED ORDER — PROPOFOL INFUSION 10 MG/ML OPTIME
INTRAVENOUS | Status: DC | PRN
  Administered 2014-09-08: 50 ug/kg/min via INTRAVENOUS

## 2014-09-08 MED ORDER — CEFAZOLIN SODIUM-DEXTROSE 2-3 GM-% IV SOLR
INTRAVENOUS | Status: AC
  Filled 2014-09-08: qty 50

## 2014-09-08 MED ORDER — LIDOCAINE HCL (CARDIAC) 20 MG/ML IV SOLN
INTRAVENOUS | Status: DC | PRN
  Administered 2014-09-08: 25 mg via INTRAVENOUS

## 2014-09-08 SURGICAL SUPPLY — 1 items: Endo VIve Peg ×3 IMPLANT

## 2014-09-08 NOTE — Transfer of Care (Signed)
Immediate Anesthesia Transfer of Care Note  Patient: Ernest Mallick  Procedure(s) Performed: Procedure(s): ESOPHAGOGASTRODUODENOSCOPY (EGD) WITH PROPOFOL (N/A) PERCUTANEOUS ENDOSCOPIC GASTROSTOMY (PEG) PLACEMENT (N/A)  Patient Location: PACU and Endoscopy Unit  Anesthesia Type:MAC  Level of Consciousness: awake and alert   Airway & Oxygen Therapy: Patient Spontanous Breathing and Patient connected to nasal cannula oxygen  Post-op Assessment: Report given to RN and Post -op Vital signs reviewed and stable  Post vital signs: Reviewed and stable  Last Vitals:  Filed Vitals:   09/08/14 1125  BP: 94/43  Pulse: 103  Temp:   Resp: 12    Complications: No apparent anesthesia complications

## 2014-09-08 NOTE — Op Note (Addendum)
08/31/2014 - 09/08/2014  11:10 AM  PATIENT:  Steven David  59 y.o. male  PRE-OPERATIVE DIAGNOSIS:  dysphagia  POST-OPERATIVE DIAGNOSIS:  dysphagia  PROCEDURE:  Procedure(s): ESOPHAGOGASTRODUODENOSCOPY (EGD) WITH PROPOFOL PERCUTANEOUS ENDOSCOPIC GASTROSTOMY (PEG) PLACEMENT  SURGEON:  Surgeon(s): Georganna Skeans, MD  ASSISTANTS: Silvestre Gunner, [facility]   ANESTHESIA:   IV sedation  EBL:  Total I/O In: -  Out: 1050 [Urine:750; Stool:300]  BLOOD ADMINISTERED:none  DRAINS: Gastrostomy Tube   SPECIMEN:  No Specimen  DISPOSITION OF SPECIMEN:  N/A  COUNTS:  YES  DICTATION: .Rinaldo Cloud presents for PEG tube placement. He was identified in pre-op. He received IV antibiotics. Informed consent was obtained from his brother. He was brought to the endoscopy room and IV sedation was administered by the anesthesia staff. We did a time out procedure. Scope was inserted via the mouth down into the esophagus. The esophagus was grossly normal until the GE junction. There was some mild inflammation at the GE junction. The stomach was entered and was very dilated. There were patchy areas of mild gastritis. No ulcers were seen. There was only limited visualization of the first portion of the duodenum due to the bagginess of his stomach. No gross abnormalities were seen. Scope was withdrawn back into the stomach which was further insufflated. We found an excellent poke site beneath his left costal margin. Abdominal wall there was prepped and draped in sterile fashion. Small incision was made at the site after an infiltrating some local. Angiocath was inserted under direct vision followed by the guidewire. Guidewire was grasped with Endo snare and brought out through the mouth. PEG tube was attached to the guidewire. PEG tube was brought up through the abdominal wall in standard fashion. The scope was reinserted into the stomach without difficulty and we confirmed placement. A photo was taken.  The flange was applied to the PEG tube and applied to appropriate tightness. The stomach was decompressed. The Panda NG tube was removed. A dressing and antibiotic ointment were placed at the PEG site. We will apply an abdominal binder. The patient tolerated the procedure well without apparent complications and was taken recovery in stable condition. Delay start of Pharmacological VTE agent (>24hrs) due to surgical blood loss or risk of bleeding:  no  Findings - patchy gastritis      PATIENT DISPOSITION:  PACU - hemodynamically stable. Georganna Skeans, MD, MPH, FACS Pager: [phone]  8/30/201611:10 AM

## 2014-09-08 NOTE — Anesthesia Procedure Notes (Signed)
Procedure Name: MAC Date/Time: 09/08/2014 10:53 AM Performed by: Eligha Bridegroom Pre-anesthesia Checklist: Patient identified, Emergency Drugs available, Suction available, Patient being monitored and Timeout performed Patient Re-evaluated:Patient Re-evaluated prior to inductionOxygen Delivery Method: Nasal cannula Preoxygenation: Pre-oxygenation with 100% oxygen Intubation Type: IV induction

## 2014-09-08 NOTE — Progress Notes (Signed)
[facility] Progress Note  7 Days Post-Op  Subjective: Pt doing okay.  No N/V, wants me to call his friend.  TF's being held for PEG today.  Having good BM's and flatus in ostomy bag.  Keeps asking for ice water.    Objective: Vital signs in last 24 hours: Temp:  [97.5 F (36.4 C)-98.2 F (36.8 C)] 98.1 F (36.7 C) (08/30 0433) Pulse Rate:  [61-84] 70 (08/30 0433) Resp:  [18-19] 19 (08/30 0433) BP: (104-125)/(54-80) 104/54 mmHg (08/30 0433) SpO2:  [94 %-100 %] 100 % (08/30 0433) Weight:  [56.518 kg (124 lb 9.6 oz)] 56.518 kg (124 lb 9.6 oz) (08/30 0433) Last BM Date: 09/07/14  Intake/Output from previous day: 08/29 0701 - 08/30 0700 In: 2363.8 [I.V.:1100; NG/GT:858.8; IV Piggyback:405] Out: 45 [Urine:2850; Emesis/NG output:30; Stool:900] Intake/Output this shift:    PE: Gen:  Alert, NAD, pleasant Abd:  Soft, NT/ND, +BS, no HSM, narrow open wound beefy red, less oozy today, incision C/D/I, ostomy pink with lots of brown stool/flatus   Lab Results:   Recent Labs  09/08/14 0348  WBC 11.2*  HGB 9.1*  HCT 29.4*  PLT 92*   BMET  Recent Labs  09/07/14 0301 09/08/14 0348  NA 149* 148*  K 3.4* 3.7  CL 114* 110  CO2 29 32  GLUCOSE 147* 96  BUN 8 7  CREATININE 0.68 0.75  CALCIUM 7.7* 7.7*   PT/INR No results for input(s): LABPROT, INR in the last 72 hours. CMP     Component Value Date/Time   NA 148* 09/08/2014 0348   K 3.7 09/08/2014 0348   CL 110 09/08/2014 0348   CO2 32 09/08/2014 0348   GLUCOSE 96 09/08/2014 0348   BUN 7 09/08/2014 0348   CREATININE 0.75 09/08/2014 0348   CALCIUM 7.7* 09/08/2014 0348   PROT 4.6* 09/02/2014 0446   ALBUMIN 2.3* 09/02/2014 0930   AST 34 09/02/2014 0446   ALT 18 09/02/2014 0446   ALKPHOS 36* 09/02/2014 0446   BILITOT 1.2 09/02/2014 0446   GFRNONAA >60 09/08/2014 0348   GFRAA >60 09/08/2014 0348   Lipase     Component Value Date/Time   LIPASE 18 04/23/2013 1045       Studies/Results: Dg Abd  Portable 1v  09/06/2014   CLINICAL DATA:  NG tube placement  EXAM: PORTABLE ABDOMEN - 1 VIEW  COMPARISON:  09/04/2014  FINDINGS: The enteric tube extends into the stomach and continues to the expected location of the second portion of the duodenum.  IMPRESSION: Enteric tube extends to the expected location of the duodenal second portion.   Electronically Signed   By: Andreas Newport M.D.   On: 09/06/2014 21:03    Anti-infectives: Anti-infectives    Start     Dose/Rate Route Frequency Ordered Stop   09/08/14 1100  ceFAZolin (ANCEF) IVPB 2 g/50 mL premix     2 g 100 mL/hr over 30 Minutes Intravenous To Short Stay 09/07/14 1503 09/09/14 1100   09/02/14 0930  cefTRIAXone (ROCEPHIN) 2 g in dextrose 5 % 50 mL IVPB  Status:  Discontinued     2 g 100 mL/hr over 30 Minutes Intravenous Every 24 hours 09/02/14 0833 09/03/14 0723   09/01/14 1600  cefoTEtan (CEFOTAN) 2 g in dextrose 5 % 50 mL IVPB     2 g 100 mL/hr over 30 Minutes Intravenous Every 12 hours 09/01/14 0705 09/02/14 0433   09/01/14 0330  cefOXitin (MEFOXIN) 2 g in dextrose 5 % 50 mL IVPB  2 g 100 mL/hr over 30 Minutes Intravenous  Once 09/01/14 0328 09/01/14 0433   09/01/14 0200  piperacillin-tazobactam (ZOSYN) IVPB 3.375 g     3.375 g 100 mL/hr over 30 Minutes Intravenous  Once 09/01/14 0156 09/01/14 0311       Assessment/Plan POD #7, S/p ex lap with sigmoid colectomy, colostomy, tissue repair (peritoneal closure only) of LIH, Dr. Donne Hazel -Cont BID dressing changes -NPO due to dysphagia/holding TF for PEG. Was tolerating tube feedings going at 65 mL per hour.  -Dr. Grandville Silos to place PEG tube today.  -WOC consult for routine ostomy care, appreciate their assistance -Mobilize with PT, baseline uses a cane/minimal assist at home DVT Prophylaxis -SCDs -Lovenox held due to general oozing and thrombocytopenia  ABL Anemia & Thrombocytopenia -Appears chronic as he came in this way. Platelet count 92,000 on 8/30. Less  oozing. Hold chemical prophylaxis.  Consider restarting lovenox when Plt count above 100,000  VDRF Resolved. Appreciate CCM assistance Schizophrenia -Holdol held due to severe dysphagia. On Geodon PRN while NPO. Once we get a g-tube then we can crush these and put them down his tube -Defer medication selection and dosage for Panda tube to psychiatry -Appreciate psych assistance. psych did confirm to NOT restart lithium due to depakote, even at discharge Hypothyroidism -on IV synthroid for now New onset seizures -neurology on board. Appreciate their assitance -CT of his head negative, EEG normal -on depakote Severe dysphagia -PEG today -Eventually we can get him on cyclical TF's. He will need [facility] services. Hypernatremia -on 100cc/hr of free water via Panda tube every 4 hours. Will continue this 30 doses. -Continue free water and D5W IV -Check labs tomorrow Hypomagnesemia.Magnesium now normalized Hypokalemia. Normalized today    LOS: 7 days    Nat Christen 09/08/2014, 8:40 AM Pager: [phone]

## 2014-09-08 NOTE — Anesthesia Preprocedure Evaluation (Addendum)
Anesthesia Evaluation  Patient identified by MRN, date of birth, ID band Patient confused    Reviewed: Allergy & Precautions, NPO status , Patient's Chart, lab work & pertinent test resultsPreop documentation limited or incomplete due to emergent nature of procedure.  History of Anesthesia Complications Negative for: history of anesthetic complications  Airway Mallampati: II  TM Distance: >3 FB Neck ROM: Full    Dental  (+) Edentulous Upper, Edentulous Lower   Pulmonary pneumonia -, COPDCurrent Smoker, former smoker,  breath sounds clear to auscultation        Cardiovascular Rhythm:Regular Rate:Normal  '15 ECHO: EF 55-60%, valves ok   Neuro/Psych Seizures -,  PSYCHIATRIC DISORDERS Schizophrenia CVA (walks with cane), Residual Symptoms    GI/Hepatic Neg liver ROS, N/v today: possible volvulus with dead bowel   Endo/Other  Hypothyroidism   Renal/GU Renal Insufficiency and ARFRenal disease (creat 2.01, K+ 5.4)     Musculoskeletal negative musculoskeletal ROS (+)   Abdominal   Peds  Hematology  (+) Blood dyscrasia (thrombocytopenia), anemia ,   Anesthesia Other Findings   Reproductive/Obstetrics                             Anesthesia Physical  Anesthesia Plan  ASA: IV  Anesthesia Plan: General   Post-op Pain Management:    Induction: Intravenous and Cricoid pressure planned  Airway Management Planned: Oral ETT  Additional Equipment: Arterial line  Intra-op Plan:   Post-operative Plan: Post-operative intubation/ventilation  Informed Consent:   History available from chart only and Only emergency history available  Plan Discussed with: CRNA and Surgeon  Anesthesia Plan Comments: (Plan routine monitors, A line, GETA with post op ventilation)       Anesthesia Quick Evaluation

## 2014-09-08 NOTE — Anesthesia Postprocedure Evaluation (Signed)
Anesthesia Post Note  Patient: Steven David  Procedure(s) Performed: Procedure(s) (LRB): ESOPHAGOGASTRODUODENOSCOPY (EGD) WITH PROPOFOL (N/A) PERCUTANEOUS ENDOSCOPIC GASTROSTOMY (PEG) PLACEMENT (N/A)  Anesthesia type: MAC  Patient location: PACU  Post pain: Pain level controlled  Post assessment: Post-op Vital signs reviewed  Last Vitals: BP 117/61 mmHg  Pulse 90  Temp(Src) 36.4 C (Oral)  Resp 14  Ht 5\' 6"  (1.676 m)  Wt 124 lb 9.6 oz (56.518 kg)  BMI 20.12 kg/m2  SpO2 100%  Post vital signs: Reviewed  Level of consciousness: awake  Complications: No apparent anesthesia complications

## 2014-09-08 NOTE — Progress Notes (Signed)
MEDICATION RELATED CONSULT NOTE - F/U   Pharmacy Consult for IV Valproic Acid Indication: Schizophrenia, seizures   No Known Allergies  Patient Measurements: Height: 5\' 6"  (167.6 cm) Weight: 124 lb 9.6 oz (56.518 kg) IBW/kg (Calculated) : 63.8 Adjusted Body Weight:   Vital Signs: Temp: 98.1 F (36.7 C) (08/30 0433) Temp Source: Oral (08/30 0433) BP: 104/54 mmHg (08/30 0433) Pulse Rate: 70 (08/30 0433) Intake/Output from previous day: 08/29 0701 - 08/30 0700 In: 2363.8 [I.V.:1100; NG/GT:858.8; IV [id number]:761] Out: 3780 [Urine:2850; Emesis/NG output:30; Stool:900] Intake/Output from this shift:    Labs:  Recent Labs  09/06/14 0439 09/07/14 0301 09/08/14 0348  WBC  --   --  11.2*  HGB  --   --  9.1*  HCT  --   --  29.4*  PLT  --   --  92*  CREATININE 0.84 0.68 0.75  MG 2.1  --   --   PHOS 2.6  --   --    Estimated Creatinine Clearance: 81.4 mL/min (by C-G formula based on Cr of 0.75).    Assessment: Emesis 59 y/o M from group home presents with N/V and lower abdominal pain.   8/22: strangulated LIH, necrotic bowel which required, Sigmoid colectomy/colostomy. 8/24: Seizures x 2  Patient has a h/o schizophrenia. Oral haldol is on hold, psych does not want to restart lithium, and has placed patient on IV VA with pharmacy to help with monitoring levels. Patient should reach steady state in 2-4 days (no liver dysfunction) and has been on it since 8/25 PM). LFT's WNL  He has severe dysphagia and is on tube feeding by [facility]. Plan PEG. Dose conversion recommendations below in plan.   8/30: VA level 72 WNL  Goal of Therapy:  Valproic acid level 50-100 mcg/ml  Plan:  - IV VA 500mg  IV q12h dose ok based on level. - Currently on IV VA 500mg  q12 hrs (Psych note says q8h??) which is 17mg /kg/day. This dose is adequate for monotherapy initiation with seizures. - May increase dosage 5 to 10 mg/kg/day at 1-week intervals to achieve optimal clinical response (MAX 60  mg/kg/day or less with a therapeutic range of 50 to 100 mcg/mL)  --When converting from valproic acid (IV): initiate divalproex sodium sprinkle capsules at the same daily dose and dosing schedule; once stabilized, divalproex sodium may be given 2 or 3 times/day - This could be given via panda tube now if desired.  Angelisse Riso S. Alford Highland, PharmD, BCPS Clinical Staff Pharmacist Pager [phone]  Eilene Ghazi Stillinger 09/08/2014,8:53 AM

## 2014-09-09 LAB — CBC
HEMATOCRIT: 31 % — AB (ref 39.0–52.0)
HEMOGLOBIN: 9.5 g/dL — AB (ref 13.0–17.0)
MCH: 28.5 pg (ref 26.0–34.0)
MCHC: 30.6 g/dL (ref 30.0–36.0)
MCV: 93.1 fL (ref 78.0–100.0)
Platelets: 103 10*3/uL — ABNORMAL LOW (ref 150–400)
RBC: 3.33 MIL/uL — AB (ref 4.22–5.81)
RDW: 15.4 % (ref 11.5–15.5)
WBC: 11.1 10*3/uL — ABNORMAL HIGH (ref 4.0–10.5)

## 2014-09-09 LAB — GLUCOSE, CAPILLARY
GLUCOSE-CAPILLARY: 105 mg/dL — AB (ref 65–99)
GLUCOSE-CAPILLARY: 106 mg/dL — AB (ref 65–99)
GLUCOSE-CAPILLARY: 107 mg/dL — AB (ref 65–99)
GLUCOSE-CAPILLARY: 131 mg/dL — AB (ref 65–99)
Glucose-Capillary: 80 mg/dL (ref 65–99)
Glucose-Capillary: 103 mg/dL — ABNORMAL HIGH (ref 65–99)

## 2014-09-09 LAB — BASIC METABOLIC PANEL
ANION GAP: 5 (ref 5–15)
BUN: 6 mg/dL (ref 6–20)
CO2: 32 mmol/L (ref 22–32)
Calcium: 8.1 mg/dL — ABNORMAL LOW (ref 8.9–10.3)
Chloride: 111 mmol/L (ref 101–111)
Creatinine, Ser: 0.71 mg/dL (ref 0.61–1.24)
GFR calc Af Amer: >60 mL/min (ref >60)
GLUCOSE: 110 mg/dL — AB (ref 65–99)
POTASSIUM: 3.6 mmol/L (ref 3.5–5.1)
Sodium: 148 mmol/L — ABNORMAL HIGH (ref 135–145)

## 2014-09-09 MED ORDER — DIVALPROEX SODIUM 125 MG PO CSDR
500.0000 mg | DELAYED_RELEASE_CAPSULE | Freq: Two times a day (BID) | ORAL | Status: DC
  Administered 2014-09-09 – 2014-09-10 (×3): 500 mg via ORAL
  Filled 2014-09-09 (×4): qty 4

## 2014-09-09 MED ORDER — ENOXAPARIN SODIUM 40 MG/0.4ML ~~LOC~~ SOLN
40.0000 mg | SUBCUTANEOUS | Status: DC
  Administered 2014-09-09 – 2014-09-10 (×2): 40 mg via SUBCUTANEOUS
  Filled 2014-09-09 (×2): qty 0.4

## 2014-09-09 MED ORDER — HALOPERIDOL 1 MG PO TABS
10.0000 mg | ORAL_TABLET | Freq: Every evening | ORAL | Status: DC
  Administered 2014-09-09: 10 mg
  Filled 2014-09-09: qty 10

## 2014-09-09 MED ORDER — PANTOPRAZOLE SODIUM 40 MG PO PACK
40.0000 mg | PACK | Freq: Every day | ORAL | Status: DC
  Administered 2014-09-10: 40 mg
  Filled 2014-09-09: qty 20

## 2014-09-09 MED ORDER — JEVITY 1.5 CAL/FIBER PO LIQD
1000.0000 mL | ORAL | Status: AC
  Administered 2014-09-09 – 2014-09-10 (×2): 1000 mL
  Filled 2014-09-09 (×2): qty 1000

## 2014-09-09 MED ORDER — LEVOTHYROXINE SODIUM 100 MCG PO TABS
100.0000 ug | ORAL_TABLET | Freq: Every day | ORAL | Status: DC
  Administered 2014-09-09 – 2014-09-10 (×2): 100 ug
  Filled 2014-09-09: qty 2
  Filled 2014-09-09: qty 1

## 2014-09-09 MED ORDER — HYDROCODONE-ACETAMINOPHEN 5-325 MG PO TABS: 1.0000 | ORAL_TABLET | ORAL | Status: DC | PRN

## 2014-09-09 NOTE — Progress Notes (Signed)
[facility] Progress Note  1 Day Post-Op  Subjective: Pt feels good, no N/V or pain.  Wants ice water.  G-tube site okay.  No N/V.  Wants to get up to the chair.  Good flatus/bm in bag.  Objective: Vital signs in last 24 hours: Temp:  [97.5 F (36.4 C)] 97.5 F (36.4 C) (08/31 0503) Pulse Rate:  [66-109] 72 (08/31 0503) Resp:  [12-20] 16 (08/31 0503) BP: (94-122)/(43-83) 95/44 mmHg (08/31 0503) SpO2:  [96 %-100 %] 99 % (08/31 0503) Weight:  [56.246 kg (124 lb)] 56.246 kg (124 lb) (08/31 0503) Last BM Date: 09/08/14  Intake/Output from previous day: 08/30 0701 - 08/31 0700 In: 250 [I.V.:250] Out: 3425 [Urine:3075; Stool:350] Intake/Output this shift:    PE: Gen:  Alert, NAD, pleasant Card:  RRR, no M/G/R heard Pulm:  CTA, no W/R/R, good effort Abd: Soft, NT/ND, +BS, no HSM, narrow open wound beefy red, no bleeding, incision C/D/I, ostomy pink with lots of brown stool/flatus, new PEG placed in LUQ, clean and dry   Lab Results:   Recent Labs  09/08/14 0348 09/09/14 0410  WBC 11.2* 11.1*  HGB 9.1* 9.5*  HCT 29.4* 31.0*  PLT 92* 103*   BMET  Recent Labs  09/08/14 0348 09/09/14 0410  NA 148* 148*  K 3.7 3.6  CL 110 111  CO2 32 32  GLUCOSE 96 110*  BUN 7 6  CREATININE 0.75 0.71  CALCIUM 7.7* 8.1*   PT/INR No results for input(s): LABPROT, INR in the last 72 hours. CMP     Component Value Date/Time   NA 148* 09/09/2014 0410   K 3.6 09/09/2014 0410   CL 111 09/09/2014 0410   CO2 32 09/09/2014 0410   GLUCOSE 110* 09/09/2014 0410   BUN 6 09/09/2014 0410   CREATININE 0.71 09/09/2014 0410   CALCIUM 8.1* 09/09/2014 0410   PROT 4.6* 09/02/2014 0446   ALBUMIN 2.3* 09/02/2014 0930   AST 34 09/02/2014 0446   ALT 18 09/02/2014 0446   ALKPHOS 36* 09/02/2014 0446   BILITOT 1.2 09/02/2014 0446   GFRNONAA >60 09/09/2014 0410   GFRAA >60 09/09/2014 0410   Lipase     Component Value Date/Time   LIPASE 18 04/23/2013 1045        Studies/Results: No results found.  Anti-infectives: Anti-infectives    Start     Dose/Rate Route Frequency Ordered Stop   09/08/14 1100  ceFAZolin (ANCEF) IVPB 2 g/50 mL premix     2 g 100 mL/hr over 30 Minutes Intravenous To Short Stay 09/07/14 1503 09/08/14 1100   09/02/14 0930  cefTRIAXone (ROCEPHIN) 2 g in dextrose 5 % 50 mL IVPB  Status:  Discontinued     2 g 100 mL/hr over 30 Minutes Intravenous Every 24 hours 09/02/14 0833 09/03/14 0723   09/01/14 1600  cefoTEtan (CEFOTAN) 2 g in dextrose 5 % 50 mL IVPB     2 g 100 mL/hr over 30 Minutes Intravenous Every 12 hours 09/01/14 0705 09/02/14 0433   09/01/14 0330  cefOXitin (MEFOXIN) 2 g in dextrose 5 % 50 mL IVPB     2 g 100 mL/hr over 30 Minutes Intravenous  Once 09/01/14 0328 09/01/14 0433   09/01/14 0200  piperacillin-tazobactam (ZOSYN) IVPB 3.375 g     3.375 g 100 mL/hr over 30 Minutes Intravenous  Once 09/01/14 0156 09/01/14 0311       Assessment/Plan POD #8, S/p ex lap with sigmoid colectomy, colostomy, tissue repair (peritoneal closure only) of  LIH, Dr. Donne Hazel -Cont BID dressing changes -WOC consult for routine ostomy care, appreciate their assistance -Mobilize with PT, baseline uses a cane/minimal assist at home DVT Prophylaxis -SCDs -Start lovenox today since Plt count improved ABL Anemia & Thrombocytopenia -Appears chronic as he came in this way. Platelet count >100,000 now VDRF -Resolved. Appreciate CCM assistance Schizophrenia -Resume haldol per tube. D/c Geodon.  -Defer medication selection and dosage for PEG tube to psychiatry -Psych did confirm to NOT restart lithium due to depakote, even at discharge Hypothyroidism -Synthroid per tube New onset seizures -Neurology recommended Depakote 500mg  BID - start tablet via tube -CT of his head negative, EEG normal -F/u with neurology as an outpatient  Severe dysphagia -POD #1 s/p PEG (09/08/14 - Dr. Grandville Silos) -Resume tube feedings, goal 65 mL  per hour.  -Work towards cyclical TF's.  Hypernatremia -on 100cc/hr of free water via Panda tube every 4 hours. Will continue this 30 doses. -Continue free water and D5W IV -Stable Hypomagnesemia.Magnesium now normalized Hypokalemia. Normalized today Disp - d/c foley, once TF advanced, maybe tomorrow with [facility] services (ordered)    LOS: 8 days    Nat Christen 09/09/2014, 8:33 AM Pager: [phone]

## 2014-09-09 NOTE — Care Management Note (Signed)
Case Management Note  Patient Details  Name: Steven David MRN: [id number] Date of Birth: [birth year]  Subjective/Objective:                    Action/Plan:  Patient resides at [facility] , spoke with De [facility]anch [phone] ( [place]) , discussed potential discharge for tomorrow 09-10-14 . Explained tube feeds at night , dressing changes twice a day , and new ostomy .  Orders for home health RN,PT,OT SW, aide and SLP . Ms Marcello Moores requested referral for [place].   Called Clinical Dietitian , Colletta [place] requesting recommendations for cyclic TF , will order for home once received. Expected Discharge Date:                  Expected Discharge Plan:  [place]  In-House Referral:     Discharge planning Services  CM Consult  Post Acute Care Choice:  Home Health, Durable Medical Equipment Choice offered to:  [facility] POA / Guardian  DME Arranged:  Tube feeding, Tube feeding pump DME Agency:  [place]:  RN, PT, OT, Nurse's Aide, Speech Therapy [place] Agency:  [place]  Status of Service:  In process, will continue to follow  Medicare Important Message Given:  Yes-second notification given Date Medicare IM Given:    Medicare IM give by:    Date Additional Medicare IM Given:    Additional Medicare Important Message give by:     If discussed at [place] of Stay Meetings, dates discussed:    Additional Comments:  Marilu Favre, RN 09/09/2014, 10:17 AM

## 2014-09-09 NOTE — Progress Notes (Signed)
Physical Therapy Treatment Patient Details Name: Steven David MRN: [id number] DOB: 1955/02/17 Today's Date: 09/09/2014    History of Present Illness Pt is a 59 y/o male who lives at a group home, and has a PMH that includes schizophrenia. Pt was taken to the OR 8/22 with strangulated LIH, necrotic bowel which required sigmoid colectomy/colostomy. He was left intubated and was extubated 8/24.     PT Comments    Continues to progress towards functional goals. Requires assist for balance and walker control. Low motivation to ambulate further distances, requiring encouragement. Patient will continue to benefit from skilled physical therapy services to further improve independence with functional mobility. If group home can provide this amount of assistance 24/7, then he will be appropriate for d/c to this setting with HHPT to further progress his safety with mobility and independence.  Follow Up Recommendations  Home health PT;Supervision/Assistance - 24 hour     Equipment Recommendations  Rolling walker with 5" wheels    Recommendations for Other Services       Precautions / Restrictions Precautions Precautions: Fall Precaution Comments: NG tube, colostomy Restrictions Weight Bearing Restrictions: No    Mobility  Bed Mobility Overal bed mobility: Needs Assistance Bed Mobility: Supine to Sit     Supine to sit: Mod assist;HOB elevated     General bed mobility comments: In recliner  Transfers Overall transfer level: Needs assistance Equipment used: Rolling walker (2 wheeled) Transfers: Sit to/from Stand Sit to Stand: Min guard Stand pivot transfers: Min guard       General transfer comment: Min guard for safety. Cues for hand placement. Minimal use of RW upon standing during pivot from recliner to [facility].  Ambulation/Gait Ambulation/Gait assistance: Min assist;+2 safety/equipment Ambulation Distance (Feet): 70 Feet Assistive device: Rolling walker (2 wheeled) Gait  Pattern/deviations: Step-through pattern;Decreased stride length;Staggering right;Drifts right/left;Narrow base of support (trunk rotated to Lt.) Gait velocity: slow Gait velocity interpretation: <1.8 ft/sec, indicative of risk for recurrent falls General Gait Details: Min assist for walker control and placement for proximity at times. As pt fatigued min assist for balance due to pushing RW to far anteriorly. VC for wider step width and longer length.  Required encouragement to continue ambulating further distance. Cues for upright posture. Second person to manage equipment/lines   Stairs            Wheelchair Mobility    Modified Rankin (Stroke Patients Only)       Balance Overall balance assessment: Needs assistance Sitting-balance support: Bilateral upper extremity supported;Feet supported Sitting balance-Leahy Scale: Poor     Standing balance support: Bilateral upper extremity supported;During functional activity Standing balance-Leahy Scale: Poor                      Cognition Arousal/Alertness: Awake/alert Behavior During Therapy: Flat affect Overall Cognitive Status: History of cognitive impairments - at baseline                      Exercises      General Comments General comments (skin integrity, edema, etc.): Sat on BSC, did not have BM      Pertinent Vitals/Pain Pain Assessment: No/denies pain    Home Living                      Prior Function            PT Goals (current goals can now be found in the care plan section) Acute Rehab  PT Goals Patient Stated Goal: drink water PT Goal Formulation: Patient unable to participate in goal setting Time For Goal Achievement: 09/17/14 Potential to Achieve Goals: Good Progress towards PT goals: Progressing toward goals    Frequency  Min 3X/week    PT Plan Current plan remains appropriate    Co-evaluation             End of Session Equipment Utilized During Treatment:  Gait belt Activity Tolerance: Patient limited by fatigue Patient left: in chair;with call bell/phone within reach;with chair alarm set;with nursing/sitter in room     Time: 1519-1540 PT Time Calculation (min) (ACUTE ONLY): 21 min  Charges:  $Gait Training: 8-22 mins                    G Codes:      Steven David 10-Sep-2014, 4:41 PM Steven David [place], [place]

## 2014-09-09 NOTE — Progress Notes (Signed)
Nutrition Follow-up  DOCUMENTATION CODES:   Severe malnutrition in context of chronic illness  INTERVENTION:   Initiate Jevity 1.5 @ 110 ml/hr via PEG x 12 hours  If no IVFs at discharge, recommend 200 ml free water flush 4 times daily.  Tube feeding regimen provides 1980 kcal (100% of needs), 84 grams of protein, and 1003 ml of H2O (1803 ml fluid daily with free water flush regimen).   NUTRITION DIAGNOSIS:   Malnutrition related to chronic illness as evidenced by severe depletion of body fat, severe depletion of muscle mass.  Ongoing  GOAL:   Patient will meet greater than or equal to 90% of their needs  Progressing  MONITOR:   TF tolerance, Labs, Weight trends  REASON FOR ASSESSMENT:   Consult Enteral/tube feeding initiation and management  ASSESSMENT:   59 y/o male who lives at a group home, and has a PMH that includes schizophrenia. Pt was taken to the OR 8/22 with strangulated LIH, necrotic bowel which required sigmoid colectomy/colostomy. He was left intubated and was extubated 8/24.   S/p Procedure on 09/01/14: 1. Sigmoid colectomy 2. Sigmoid colostomy 3. Primary repair of strangulated LIH  S/p Procedure(s) on 09/08/14: ESOPHAGOGASTRODUODENOSCOPY (EGD) WITH PROPOFOL PERCUTANEOUS ENDOSCOPIC GASTROSTOMY (PEG) PLACEMENT  Pt sleeping soundly at time of visit.   TF were just re-started today. Jevity 1.2 is currently infusing via PEG @ 20 ml/hr (which provides 576 kcals, 27 grams protein, and 387 ml fluid daily, which meets 32% of estimated kcal needs and 32% of estimated protein needs). Tolerating TF well; 5 ml gastric residuals noted.   Pt remains with LLQ colostomy; 50-300 ml output noted.   RNCM and surgical PA requesting transition to nocturnal feedings. Plan is to discharge back to to [facility] with home health services today or tomorrow.   Labs reviewed: Na: 148.   Diet Order:  Diet NPO time specified  Skin:  Reviewed, no issues  Last  BM:  8/26  Height:   Ht Readings from Last 1 Encounters:  09/01/14 5\' 6"  (1.676 m)    Weight:   Wt Readings from Last 1 Encounters:  09/09/14 124 lb (56.246 kg)    Ideal Body Weight:  64.5 kg  BMI:  Body mass index is 20.02 kg/(m^2).  Estimated Nutritional Needs:   Kcal:  1800-2000  Protein:  85-100 gm  Fluid:  1.8-2 L  EDUCATION NEEDS:   No education needs identified at this time  Steven David, RD, LDN, CDE Pager: [phone] After hours Pager: [phone]

## 2014-09-10 LAB — GLUCOSE, CAPILLARY
GLUCOSE-CAPILLARY: 102 mg/dL — AB (ref 65–99)
GLUCOSE-CAPILLARY: 116 mg/dL — AB (ref 65–99)

## 2014-09-10 MED ORDER — OXYBUTYNIN CHLORIDE 5 MG PO TABS: 5.0000 mg | ORAL_TABLET | Freq: Two times a day (BID) | ORAL | Status: DC

## 2014-09-10 MED ORDER — LEVOTHYROXINE SODIUM 100 MCG PO TABS: 100.0000 ug | ORAL_TABLET | Freq: Every day | ORAL | Status: DC

## 2014-09-10 MED ORDER — DIVALPROEX SODIUM 125 MG PO CSDR: 500.0000 mg | DELAYED_RELEASE_CAPSULE | Freq: Two times a day (BID) | ORAL | Status: DC

## 2014-09-10 MED ORDER — POLYETHYLENE GLYCOL 3350 17 GM/SCOOP PO POWD: 17.0000 g | Freq: Every day | ORAL | Status: DC | PRN

## 2014-09-10 MED ORDER — JEVITY 1.5 CAL/FIBER PO LIQD: 1000.0000 mL | ORAL | Status: DC

## 2014-09-10 MED ORDER — HYDROCODONE-ACETAMINOPHEN 5-325 MG PO TABS: 1.0000 | ORAL_TABLET | ORAL | Status: DC | PRN

## 2014-09-10 MED ORDER — HYDROCODONE-ACETAMINOPHEN 5-325 MG PO TABS: 1.0000 | ORAL_TABLET | Freq: Four times a day (QID) | ORAL | Status: DC | PRN

## 2014-09-10 MED ORDER — LORATADINE 10 MG PO TABS
10.0000 mg | ORAL_TABLET | Freq: Every day | ORAL | Status: DC
Start: 2014-09-10 — End: 2017-08-06

## 2014-09-10 MED ORDER — OXYBUTYNIN CHLORIDE 5 MG PO TABS
5.0000 mg | ORAL_TABLET | Freq: Two times a day (BID) | ORAL | Status: DC
  Administered 2014-09-10: 5 mg
  Filled 2014-09-10: qty 1

## 2014-09-10 MED ORDER — LOVASTATIN 20 MG PO TABS: 20.0000 mg | ORAL_TABLET | Freq: Every day | ORAL | Status: AC

## 2014-09-10 MED ORDER — HALOPERIDOL 10 MG PO TABS: 10.0000 mg | ORAL_TABLET | Freq: Every evening | ORAL | Status: DC

## 2014-09-10 NOTE — Progress Notes (Signed)
Physical Therapy Treatment Patient Details Name: Steven David MRN: [id number] DOB: 04-21-1955 Today's Date: 09/10/2014    History of Present Illness Pt is a 59 y/o male who lives at a group home, and has a PMH that includes schizophrenia. Pt was taken to the OR 8/22 with strangulated LIH, necrotic bowel which required sigmoid colectomy/colostomy. He was left intubated and was extubated 8/24.     PT Comments    Encouragement needed for pt to participate in mobility.  Follow Up Recommendations  Home health PT;Supervision/Assistance - 24 hour     Equipment Recommendations  Rolling walker with 5" wheels    Recommendations for Other Services       Precautions / Restrictions Precautions Precautions: Fall;Other (comment) Precaution Comments: NG tube, colostomy    Mobility  Bed Mobility         Supine to sit: Mod assist;HOB elevated     General bed mobility comments: Verbal cues for sequencing  Transfers   Equipment used: Rolling walker (2 wheeled)   Sit to Stand: Min guard Stand pivot transfers: Min guard       General transfer comment: Min guard for safety  Ambulation/Gait Ambulation/Gait assistance: Min assist;+2 safety/equipment Ambulation Distance (Feet): 100 Feet Assistive device: Rolling walker (2 wheeled) Gait Pattern/deviations: Decreased stride length;Step-through pattern Gait velocity: slow Gait velocity interpretation: <1.8 ft/sec, indicative of risk for recurrent falls General Gait Details: Verbal cues to increase step length and proper RW management. Pt needed encouragement for increased distance.   Stairs            Wheelchair Mobility    Modified Rankin (Stroke Patients Only)       Balance                                    Cognition Arousal/Alertness: Awake/alert Behavior During Therapy: Flat affect Overall Cognitive Status: History of cognitive impairments - at baseline                       Exercises      General Comments        Pertinent Vitals/Pain Pain Assessment: No/denies pain    Home Living                      Prior Function            PT Goals (current goals can now be found in the care plan section) Acute Rehab PT Goals Patient Stated Goal: have something to drink PT Goal Formulation: Patient unable to participate in goal setting Time For Goal Achievement: 09/17/14 Potential to Achieve Goals: Good Progress towards PT goals: Progressing toward goals    Frequency  Min 3X/week    PT Plan Current plan remains appropriate    Co-evaluation             End of Session Equipment Utilized During Treatment: Gait belt Activity Tolerance: Patient limited by fatigue Patient left: in chair;with call bell/phone within reach;with chair alarm set     Time: 0940-1005 PT Time Calculation (min) (ACUTE ONLY): 25 min  Charges:  $Gait Training: 23-37 mins                    G Codes:      Lorriane Shire 09/10/2014, 11:38 AM

## 2014-09-10 NOTE — Consult Note (Addendum)
WOC ostomy follow-up consult note CCS following for assessment and plan of care to abd wound, PA at the bedside to assess the wound. Stoma type/location: Colostomy to LLQ Stomal assessment/size: Stoma is red and viable, above skin level, 1 3/4 inches Peristomal assessment: Red patchy areas of denuded skin surrounding stoma, appearance consistent with moisture or chemical dermatitis. Previous pouch was leaking behind the barrier. Treatment options for stomal/peristomal skin: Applied barrier ring Output: Mod amt semi-formed brown stool in the pouch. Ostomy pouching: 1pc.  Education provided: Demonstrated pouch change with one piece pouch with barrier ring to maintain seal. Pt did not watch, participate, or ask questions. He does not appear to understand the function of the pouch. No family members at the bedside. He will need total assistance with pouch application and emptying after discharge at this point. Supplies ordered to the room for staff nurse use. EMR states he will have home health assistance at a facility after discharge. Enrolled patient in [place] program: Yes Julien Girt MSN, RN, [place], [place], [place]

## 2014-09-10 NOTE — Care Management Note (Signed)
Case Management Note  Patient Details  Name: Steven David MRN: [id number] Date of Birth: 04-Jun-1955  Subjective/Objective:                    Action/Plan:   Expected Discharge Date:                  Expected Discharge Plan:  [place]  In-House Referral:     Discharge planning Services  CM Consult  Post Acute Care Choice:  Home Health, Durable Medical Equipment Choice offered to:  [facility] POA / Guardian  DME Arranged:  Tube feeding, Tube feeding pump DME Agency:  [place]:  RN, PT, OT, Nurse's Aide, Speech Therapy [place] Agency:  [place]  Status of Service:  In process, will continue to follow  Medicare Important Message Given:  Yes-third notification given Date Medicare IM Given:    Medicare IM give by:    Date Additional Medicare IM Given:    Additional Medicare Important Message give by:     If discussed at [place] of Stay Meetings, dates discussed:  09-10-14  Additional Comments:  Marilu Favre, RN 09/10/2014, 3:15 PM

## 2014-09-10 NOTE — Care Management Important Message (Signed)
Important Message  Patient Details  Name: Steven David MRN: [id number] Date of Birth: 1955-04-07   Medicare Important Message Given:  Yes-third notification given    Delorse Lek 09/10/2014, 12:21 PM

## 2014-09-10 NOTE — Discharge Summary (Signed)
[facility] Discharge Summary   Patient ID: Steven David MRN: [id number] DOB/AGE: 1955-08-24 59 y.o.  Admit date: 08/31/2014 Discharge date: 09/10/2014  Admitting Diagnosis: Incarcerated inguinal hernia Schizophrenia Acute respiratory failure Thyroid disease Hyperlipidemia Hard of hearing   Discharge Diagnosis Patient Active Problem List   Diagnosis Date Noted  . Postoperative anemia due to acute blood loss 09/07/2014  . Seizure   . Incarcerated inguinal hernia 09/01/2014  . Respiratory failure, acute 09/01/2014  . Metabolic acidosis 78/58/8502  . Incarcerated hernia 09/01/2014  . Septic shock 09/01/2014  . AKI (acute kidney injury)   . Protein-calorie malnutrition, severe 04/13/2013  . Sepsis 04/11/2013  . H/O stroke within last year 04/11/2013  . Hypothyroidism 04/11/2013  . Pneumonia 04/11/2013  . Thrombocytopenia 04/11/2013  . Schizophrenia 04/11/2013  . Malnutrition 04/11/2013  . SIRS (systemic inflammatory response syndrome) 04/11/2013    Consultants Dr. Leonel Ramsay, Dr. Janann Colonel (Neurology) Dr. Louretta Shorten (Psychiatry) Dr. Chase Caller (CCM) Maryfrances Bunnell [place] nursing)  Imaging: No results found.  Procedures Dr. Donne Hazel (09/01/14) - Sigmoid colectomy, Sigmoid colostomy, Primary repair of strangulated LIH Dr. [place] Silos (09/08/14) - EGD with propofol, PEG placement   Hospital Course:  59 y.o. White male with h/o hypothyroidism, schizophrenia brought to [facility] on 08/31/14 from group home [facility]) with complaint of emesis. Per ER note pt is a has been complaining of associated nausea and lower abdominal pain.  He underwent evaluation and was transferred here to [facility] hospital. Upon arrival he would not converse and was sleepy. He occasionally just asks for water. His caretaker from the group home accompanies him.    Workup showed portal venous gas, chronic incarcerated LIH, and possible gastric volvulus.  Patient was  admitted and underwent emergent procedure listed above.  He was found to have only a strangulated LIH and a very floppy stomach without volvulus.  Tolerated procedure well and was transferred to the ICU for close monitoring.  He remained on the vent for several days and was eventually able to be extubated on 09/02/14.  Post operatively he experienced a new onset seizure which lasted 3 minutes.  Neurology was consulted and they started him on Keppra and transitioned to depakote 500mg  BID.  His lithium is to continue to be held at discharge since he has been started on depakote.  We also had psychiatry evaluate the patient and help manage his psychiatric medications while he was NPO with IV/IM options.    He had some post-operative wound oozing secondary to chronic thrombocytopenia, thus his VTE pharmacologic prophylaxis had to be held while his Platelets were <100,000.  He experienced an expected post-operative ileus which was managed with bowel rest and NG tube.  [place] nursing was consulted for management of his wound and ostomy.  Once the ileus resolved we found out the patient has significant dysphagia, SLP was consulted and have actively followed without noted improvement yet.  A PANDA tube had to be inserted and tube feedings initiated at a goal of 67mL/hr.  Eventually we were able to discuss with the patient, his care giver, and brother about PEG placement which would facilitate tube feedings without NG tube.  He successfully underwent PEG placement on 09/08/14.  He is now getting cyclical tube feedings 774JO/IN for 14 hours a day at night.    He was transferred to the surgical floor on 09/04/14.  On POD #9, the patient was voiding well, tolerating diet, ambulating well with assistance, pain well controlled, vital signs stable, wound clean,  ostomy functioning well, and felt stable for discharge to his home [facility].  Patient will follow up in our office in 2-3 weeks and knows to call with  questions or concerns.    Psychiatry recommended continued OP follow-up and holding Lithium at discharge (at [facility] recovery).  Secondary to seizures the patient is not permitted to drive, swim, or be at height other than ground level for risk of fall or drowning if seizure were to re-occur.  He will continue on cyclical tube feeds at 580DX/IP, he will need to be maintained in this dose for 14 hours since insurance will not approve 145mL/hr.  The dietitian recommends 200 ml free water flush 4 times daily.  He will need wound and ostomy care.  He has a small midline would his is very superficial and only 74mm at widest point.  He will need WD dressing changes daily until healed and routine ostomy care.  HH [facility] arranged with [facility] - RN,PT,OT, SW, aide and SLP.  He is strictly NPO until SLP says he's safely able to swallow.  All of his medications will need to be given PER TUBE. Any essential medications were switched to tablets that can be crushed and placed into the PEG.     Physical Exam: Gen: Alert, NAD, pleasant Card: RRR, no M/G/R heard Pulm: CTA, no W/R/R, good effort Abd: Soft, NT/ND, +BS, no HSM, narrow open wound beefy red, no bleeding, incision C/D/I, ostomy pink with lots of brown stool/flatus, PEG placed in LUQ, clean and dry    Medication List    STOP taking these medications        docusate sodium 100 MG capsule  Commonly known as:  COLACE     feeding supplement (ENSURE) Pudg  Replaced by:  feeding supplement (JEVITY 1.5 CAL/FIBER) Liqd     fish oil-omega-3 fatty acids 1000 MG capsule     guaifenesin 100 MG/5ML syrup  Commonly known as:  ROBITUSSIN     oxybutynin 10 MG 24 hr tablet  Commonly known as:  DITROPAN-XL  Replaced by:  oxybutynin 5 MG tablet     RESOURCE THICKENUP CLEAR Powd      TAKE these medications        divalproex 125 MG capsule  Commonly known as:  DEPAKOTE SPRINKLE  Take 4 capsules (500 mg total) by mouth every 12 (twelve)  hours.     feeding supplement (JEVITY 1.5 CAL/FIBER) Liqd  Place 1,000 mLs into feeding tube continuous. Initiate nocturnal feedings of Jevity 1.5 @ 100 ml/hr via PEG over 14 hour period     haloperidol 10 MG tablet  Commonly known as:  HALDOL  Place 1 tablet (10 mg total) into feeding tube every evening.     haloperidol decanoate 100 MG/ML injection  Commonly known as:  HALDOL DECANOATE  Inject 100 mg into the muscle every 28 (twenty-eight) days. Administered by [facility]     HYDROcodone-acetaminophen 5-325 MG per tablet  Commonly known as:  NORCO/VICODIN  Place 1-2 tablets into feeding tube every 6 (six) hours as needed for severe pain.     levothyroxine 100 MCG tablet  Commonly known as:  SYNTHROID, LEVOTHROID  Place 1 tablet (100 mcg total) into feeding tube daily before breakfast.     loratadine 10 MG tablet  Commonly known as:  CLARITIN  Place 1 tablet (10 mg total) into feeding tube daily.     lovastatin 20 MG tablet  Commonly known as:  MEVACOR  Place 1 tablet (20 mg total) into feeding tube daily.     oxybutynin 5 MG tablet  Commonly known as:  DITROPAN  Place 1 tablet (5 mg total) into feeding tube 2 (two) times daily.     polyethylene glycol powder powder  Commonly known as:  GLYCOLAX/MIRALAX  Place 17 g into feeding tube daily as needed. Dissolved in 8 ounces of water/juice daily         Follow-up Information    Follow up with [facility], MD. Schedule an appointment as soon as possible for a visit on 09/29/2014.   Specialty:  General Surgery   Why:  For post-operation check with Dr. Donne Hazel your surgeon.  Appointment on 09/22/14 at 11 am arriving at 10:30 am  to check in and fill out paperwork.   Contact information:   [address] [place] [address] [phone]       Follow up with HAWKINS,EDWARD L, MD. Schedule an appointment as soon as possible for a visit in 2 weeks.   Specialty:  Pulmonary Disease   Why:  For post-hospital  follow up with your primary care provider   Contact information:   [place] [address] [phone]       Follow up with Andrey Spearman, MD On 09/25/2014.   Specialties:  Neurology, Radiology   Why:  Your appoitment with neurology is on 09/25/14 at 10:30am, arrive by 10:00am to fill out paperwork and check in.   Contact information:   [address] [place] [address] [phone]       Call Durward Parcel., MD.   Specialty:  Psychiatry   Why:  As needed, For post-hospital follow up regarding your psychiatric medications   Contact information:   [address] [place] [address] [phone]       Signed: Nat Christen, [facility] Surgery [phone]  09/10/2014, 11:11 AM

## 2014-09-10 NOTE — Discharge Instructions (Signed)
DO NOT RESUME LITHIUM.  HE IS ON A NEW DRUG DEPAKOTE (ANTI-SEIZURE) WHICH WILL INTERACT WITH LITHIUM IF TAKEN.    Keep appointments with Neurology and General surgery.  Schedule a follow up appointment with your Primary care provider.  You may need referral to a psychiatrist as well.    Secondary to seizures the patient is not permitted to drive, swim, or be at height other than ground level for risk of fall or drowning if seizure were to re-occur.  PATIENT IS TO BE STRICTLY NOTHING TO EAT, DRINK, SWALLOW INCLUDING FOOD/DRINK/MEDICATIONS BY MOUTH.  All of his medications will need to be given PER TUBE. Any essential medications were switched to tablets that can be crushed and placed into the PEG.      Seizure, Adult A seizure is abnormal electrical activity in the brain. Seizures usually last from 30 seconds to 2 minutes. There are various types of seizures. Before a seizure, you may have a warning sensation (aura) that a seizure is about to occur. An aura may include the following symptoms:   Fear or anxiety.  Nausea.  Feeling like the room is spinning (vertigo).  Vision changes, such as seeing flashing lights or spots. Common symptoms during a seizure include:  A change in attention or behavior (altered mental status).  Convulsions with rhythmic jerking movements.  Drooling.  Rapid eye movements.  Grunting.  Loss of bladder and bowel control.  Bitter taste in the mouth.  Tongue biting. After a seizure, you may feel confused and sleepy. You may also have an injury resulting from convulsions during the seizure. HOME CARE INSTRUCTIONS   If you are given medicines, take them exactly as prescribed by your health care provider.  Keep all follow-up appointments as directed by your health care provider.  Do not swim or drive or engage in risky activity during which a seizure could cause further injury to you or others until your health care provider says it is OK.  Get  adequate rest.  Teach friends and family what to do if you have a seizure. They should:  Lay you on the ground to prevent a fall.  Put a cushion under your head.  Loosen any tight clothing around your neck.  Turn you on your side. If vomiting occurs, this helps keep your airway clear.  Stay with you until you recover.  Know whether or not you need emergency care. SEEK IMMEDIATE MEDICAL CARE IF:  The seizure lasts longer than 5 minutes.  The seizure is severe or you do not wake up immediately after the seizure.  You have an altered mental status after the seizure.  You are having more frequent or worsening seizures. Someone should drive you to the emergency department or call local emergency services (911 in [place] MAKE SURE YOU:  Understand these instructions.  Will watch your condition.  Will get help right away if you are not doing well or get worse. Document Released: 12/24/1999 Document Revised: 10/16/2012 Document Reviewed: 08/07/2012 [facility] Patient Information 2015 [place], [place]. This information is not intended to replace advice given to you by your health care provider. Make sure you discuss any questions you have with your health care provider.  Colostomy Home Guide A colostomy is an opening for stool to leave your body when a medical condition prevents it from leaving through the usual opening (rectum). During a surgery, a piece of large intestine (colon) is brought through a hole in the abdominal wall. The new opening is called a  stoma or ostomy. A bag or pouch fits over the stoma to catch stool and gas. Your stool may be liquid, somewhat pasty, or formed. CARING FOR YOUR STOMA  Normally, the stoma looks a lot like the inside of your cheek: pink, red, and moist. At first it may be swollen, but this swelling will decrease within 6 weeks. Keep the skin around your stoma clean and dry. You can gently wash your stoma and the skin around your stoma in the shower with  a clean, soft washcloth. If you develop any skin irritation, your caregiver may give you a stoma powder or ointment to help heal the area. Do not use any products other than those specifically given to you by your caregiver.  Your stoma should not be uncomfortable. If you notice any stinging or burning, your pouch may be leaking, and the skin around your stoma may be coming into contact with stool. This can cause skin irritation. If you notice stinging, replace your pouch with a new one and discard the old one. OSTOMY POUCHES  The pouch that fits over the ostomy can be made up of either 1 or 2 pieces. A one-piece pouch has a skin barrier piece and the pouch itself in one unit. A two-piece pouch has a skin barrier with a separate pouch that snaps on and off of the skin barrier. Either way, you should empty the pouch when it is only  to  full. Do not let more stool or gas build up. This could cause the pouch to leak. Some ostomy bags have a built-in gas release valve. Ostomy deodorizer (5 drops) can be put into the pouch to prevent odor. Some people use ostomy lubricant drops inside the pouch to help the stool slide out of the bag more easily and completely.  EMPTYING YOUR OSTOMY POUCH  You may get lessons on how to empty your pouch from a wound-ostomy nurse before you leave the hospital. Here are the basic steps:  Wash your hands with soap and water.  Sit far back on the toilet.  Put several pieces of toilet paper into the toilet water. This will prevent splashing as you empty the stool into the toilet bowl.  Unclip or unvelcro the tail end of the pouch.  Unroll the tail and empty stool into the toilet.  Clean the tail with toilet paper.  Reroll the tail, and clip or velcro it closed.  Wash your hands again. CHANGING YOUR OSTOMY POUCH  Change your ostomy pouch about every 3 to 4 days for the first 6 weeks, then every 5 to7 days. Always change the bag sooner if there is any leakage or you  begin to notice any discomfort or irritation of the skin around the stoma. When possible, plan to change your ostomy pouch before eating or drinking as this will lessen the chance of stool coming out during the pouch change. A wound-ostomy nurse may teach you how to change your pouch before you leave the hospital. Here are the basic steps:  Lay out your supplies.  Wash your hands with soap and water.  Carefully remove the old pouch.  Wash the stoma and allow it to dry. Men may be advised to shave any hair around the stoma very carefully. This will make the adhesive stick better.  Use the stoma measuring guide that comes with your pouch set to decide what size hole you will need to cut in the skin barrier piece. Choose the smallest possible size that will  hold the stoma but will not touch it.  Use the guide to trace the circle on the back of the skin barrier piece. Cut out the hole.  Hold the skin barrier piece over the stoma to make sure the hole is the correct size.  Remove the adhesive paper backing from the skin barrier piece.  Squeeze stoma paste around the opening of the skin barrier piece.  Clean and dry the skin around the stoma again.  Carefully fit the skin barrier piece over your stoma.  If you are using a two-piece pouch, snap the pouch onto the skin barrier piece.  Close the tail of the pouch.  Put your hand over the top of the skin barrier piece to help warm it for about 5 minutes, so that it conforms to your body better.  Wash your hands again. DIET TIPS   Continue to follow your usual diet.  Drink about eight 8 oz glasses of water each day.  You can prevent gas by eating slowly and chewing your food thoroughly.  If you feel concerned that you have too much gas, you can cut back on gas-producing foods, such as:  Spicy foods.  Onions and garlic.  Cruciferous vegetables (cabbage, broccoli, cauliflower, Brussels sprouts).  Beans and legumes.  Some  cheeses.  Eggs.  Fish.  Bubbly (carbonated) drinks.  Chewing gum. GENERAL TIPS   You can shower with or without the bag in place.  Always keep the bag on if you are bathing or swimming.  If your bag gets wet, you can dry it with a blow-dryer set to cool.  Avoid wearing tight clothing directly over your stoma so that it does not become irritated or bleed. Tight clothing can also prevent stool from draining into the pouch.  It is helpful to always have an extra skin barrier and pouch with you when traveling. Do not leave them anywhere too warm, as parts of them can melt.  Do not let your seat belt rest on your stoma. Try to keep the seat belt either above or below your stoma, or use a tiny pillow to cushion it.  You can still participate in sports, but you should avoid activities in which there is a risk of getting hit in the abdomen.  You can still have sex. It is a good idea to empty your pouch prior to sex. Some people and their partners feel very comfortable seeing the pouch during sex. Others choose to wear lingerie or a T-shirt that covers the device. SEEK IMMEDIATE MEDICAL CARE IF:  You notice a change in the size or color of the stoma, especially if it becomes very red, purple, black, or pale white.  You have bloody stools or bleeding from the stoma.  You have abdominal pain, nausea, vomiting, or bloating.  There is anything unusual protruding from the stoma.  You have irritation or red skin around the stoma.  No stool is passing from the stoma.  You have diarrhea (requiring more frequent than normal pouch emptying). Document Released: 12/29/2002 Document Revised: 03/20/2011 Document Reviewed: 05/25/2010 [facility] Patient Information 2015 [place], [place]. This information is not intended to replace advice given to you by your health care provider. Make sure you discuss any questions you have with your health care provider.

## 2014-09-10 NOTE — Progress Notes (Signed)
Discharged to [facility], transported by [employer]. Patient was alert and oriented,not in any distress, colostomy, peg tube in place with abdominal binder.

## 2014-09-11 ENCOUNTER — Encounter (HOSPITAL_COMMUNITY): Payer: Self-pay | Admitting: General Surgery

## 2014-09-11 DIAGNOSIS — E785 Hyperlipidemia, unspecified: Secondary | ICD-10-CM | POA: Diagnosis not present

## 2014-09-11 DIAGNOSIS — Z87891 Personal history of nicotine dependence: Secondary | ICD-10-CM | POA: Diagnosis not present

## 2014-09-11 DIAGNOSIS — Z48815 Encounter for surgical aftercare following surgery on the digestive system: Secondary | ICD-10-CM | POA: Diagnosis not present

## 2014-09-11 DIAGNOSIS — R569 Unspecified convulsions: Secondary | ICD-10-CM | POA: Diagnosis not present

## 2014-09-11 DIAGNOSIS — E43 Unspecified severe protein-calorie malnutrition: Secondary | ICD-10-CM | POA: Diagnosis not present

## 2014-09-11 DIAGNOSIS — Z433 Encounter for attention to colostomy: Secondary | ICD-10-CM | POA: Diagnosis not present

## 2014-09-11 DIAGNOSIS — Z431 Encounter for attention to gastrostomy: Secondary | ICD-10-CM | POA: Diagnosis not present

## 2014-09-11 DIAGNOSIS — R1310 Dysphagia, unspecified: Secondary | ICD-10-CM | POA: Diagnosis not present

## 2014-09-11 DIAGNOSIS — F209 Schizophrenia, unspecified: Secondary | ICD-10-CM | POA: Diagnosis not present

## 2014-09-12 DIAGNOSIS — Z431 Encounter for attention to gastrostomy: Secondary | ICD-10-CM | POA: Diagnosis not present

## 2014-09-12 DIAGNOSIS — Z48815 Encounter for surgical aftercare following surgery on the digestive system: Secondary | ICD-10-CM | POA: Diagnosis not present

## 2014-09-12 DIAGNOSIS — Z433 Encounter for attention to colostomy: Secondary | ICD-10-CM | POA: Diagnosis not present

## 2014-09-12 DIAGNOSIS — F209 Schizophrenia, unspecified: Secondary | ICD-10-CM | POA: Diagnosis not present

## 2014-09-12 DIAGNOSIS — R569 Unspecified convulsions: Secondary | ICD-10-CM | POA: Diagnosis not present

## 2014-09-12 DIAGNOSIS — R1310 Dysphagia, unspecified: Secondary | ICD-10-CM | POA: Diagnosis not present

## 2014-09-14 DIAGNOSIS — Z48815 Encounter for surgical aftercare following surgery on the digestive system: Secondary | ICD-10-CM | POA: Diagnosis not present

## 2014-09-14 DIAGNOSIS — Z433 Encounter for attention to colostomy: Secondary | ICD-10-CM | POA: Diagnosis not present

## 2014-09-14 DIAGNOSIS — F209 Schizophrenia, unspecified: Secondary | ICD-10-CM | POA: Diagnosis not present

## 2014-09-14 DIAGNOSIS — Z431 Encounter for attention to gastrostomy: Secondary | ICD-10-CM | POA: Diagnosis not present

## 2014-09-14 DIAGNOSIS — R569 Unspecified convulsions: Secondary | ICD-10-CM | POA: Diagnosis not present

## 2014-09-14 DIAGNOSIS — R1310 Dysphagia, unspecified: Secondary | ICD-10-CM | POA: Diagnosis not present

## 2014-09-15 ENCOUNTER — Encounter (HOSPITAL_COMMUNITY): Payer: Self-pay | Admitting: Emergency Medicine

## 2014-09-15 ENCOUNTER — Emergency Department (HOSPITAL_COMMUNITY)
Admission: EM | Admit: 2014-09-15 | Discharge: 2014-09-16 | Disposition: A | Discharge status: Home / Self Care | Payer: Medicare Other | Attending: Emergency Medicine | Admitting: Emergency Medicine

## 2014-09-15 DIAGNOSIS — F209 Schizophrenia, unspecified: Secondary | ICD-10-CM | POA: Diagnosis not present

## 2014-09-15 DIAGNOSIS — Z79899 Other long term (current) drug therapy: Secondary | ICD-10-CM | POA: Diagnosis not present

## 2014-09-15 DIAGNOSIS — Z8719 Personal history of other diseases of the digestive system: Secondary | ICD-10-CM | POA: Insufficient documentation

## 2014-09-15 DIAGNOSIS — Z433 Encounter for attention to colostomy: Secondary | ICD-10-CM | POA: Diagnosis not present

## 2014-09-15 DIAGNOSIS — E780 Pure hypercholesterolemia: Secondary | ICD-10-CM | POA: Diagnosis not present

## 2014-09-15 DIAGNOSIS — Z48815 Encounter for surgical aftercare following surgery on the digestive system: Secondary | ICD-10-CM | POA: Diagnosis not present

## 2014-09-15 DIAGNOSIS — Z4801 Encounter for change or removal of surgical wound dressing: Secondary | ICD-10-CM | POA: Diagnosis not present

## 2014-09-15 DIAGNOSIS — E079 Disorder of thyroid, unspecified: Secondary | ICD-10-CM | POA: Insufficient documentation

## 2014-09-15 DIAGNOSIS — Z5189 Encounter for other specified aftercare: Secondary | ICD-10-CM

## 2014-09-15 DIAGNOSIS — R569 Unspecified convulsions: Secondary | ICD-10-CM | POA: Diagnosis not present

## 2014-09-15 DIAGNOSIS — T849XXA Unspecified complication of internal orthopedic prosthetic device, implant and graft, initial encounter: Secondary | ICD-10-CM | POA: Diagnosis not present

## 2014-09-15 DIAGNOSIS — T83498A Other mechanical complication of other prosthetic devices, implants and grafts of genital tract, initial encounter: Secondary | ICD-10-CM | POA: Diagnosis not present

## 2014-09-15 DIAGNOSIS — Z87891 Personal history of nicotine dependence: Secondary | ICD-10-CM | POA: Diagnosis not present

## 2014-09-15 DIAGNOSIS — Z4800 Encounter for change or removal of nonsurgical wound dressing: Secondary | ICD-10-CM | POA: Diagnosis not present

## 2014-09-15 DIAGNOSIS — R1310 Dysphagia, unspecified: Secondary | ICD-10-CM | POA: Diagnosis not present

## 2014-09-15 DIAGNOSIS — H9193 Unspecified hearing loss, bilateral: Secondary | ICD-10-CM | POA: Diagnosis not present

## 2014-09-15 DIAGNOSIS — Z431 Encounter for attention to gastrostomy: Secondary | ICD-10-CM | POA: Diagnosis not present

## 2014-09-15 NOTE — ED Provider Notes (Signed)
CSN: [id number]     Arrival date & time 09/15/14  1747 History   First MD Initiated Contact with Patient 09/15/14 1811     Chief Complaint  Patient presents with  . Wound Check     (Consider location/radiation/quality/duration/timing/severity/associated sxs/prior Treatment) HPI Comments: Patient is a 59 year old male with history of schizophrenia. 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 This required surgery which was quite complicated and the patient apparently became septic. He was discharged to an extended care facility who now sends him for evaluation of his abdominal wound. He has an open midline abdominal incision that is being allowed to heal with secondary intention and wet-to-dry dressings. They're concerned about the possibility of infection in this wound. The patient has little additional history due to his schizophrenia.  Patient is a 59 y.o. male presenting with wound check. The history is provided by the patient.  Wound Check This is a new problem. The problem occurs constantly. The problem has not changed since onset.Nothing aggravates the symptoms. Nothing relieves the symptoms.    Past Medical History  Diagnosis Date  . Schizophrenia   . Hernia   . Thyroid disease   . High cholesterol   . HOH (hard of hearing)    Past Surgical History  Procedure Laterality Date  . Leg surgery    . Testicle surgery    . Laparotomy N/A 09/01/2014    Procedure: EXPLORATORY LAPAROTOMY, SIGMOID COLECTOMY, SIGMOID COLOSTOMY, PRIMARY REPAIR OF STRANGULATED INGUINAL HERNIA.;  Surgeon: Rolm Bookbinder, MD;  Location: [place];  Service: General;  Laterality: N/A;  . Esophagogastroduodenoscopy (egd) with propofol N/A 09/08/2014    Procedure: ESOPHAGOGASTRODUODENOSCOPY (EGD) WITH PROPOFOL;  Surgeon: Georganna Skeans, MD;  Location: [place];  Service: Endoscopy;  Laterality: N/A;  . Peg placement N/A 09/08/2014    Procedure: PERCUTANEOUS ENDOSCOPIC GASTROSTOMY  (PEG) PLACEMENT;  Surgeon: Georganna Skeans, MD;  Location: [place];  Service: Endoscopy;  Laterality: N/A;   History reviewed. No pertinent family history. Social History  Substance Use Topics  . Smoking status: Former Research scientist (life sciences)  . Smokeless tobacco: None  . Alcohol Use: No    Review of Systems  All other systems reviewed and are negative.     Allergies  Review of patient's allergies indicates no known allergies.  Home Medications   Prior to Admission medications   Medication Sig Start Date End Date Taking? Authorizing Provider  divalproex (DEPAKOTE SPRINKLE) 125 MG capsule Take 4 capsules (500 mg total) by mouth every 12 (twelve) hours. 09/10/14   Nat Christen, PA-C  haloperidol (HALDOL) 10 MG tablet Place 1 tablet (10 mg total) into feeding tube every evening. 09/10/14   Nat Christen, PA-C  haloperidol decanoate (HALDOL DECANOATE) 100 MG/ML injection Inject 100 mg into the muscle every 28 (twenty-eight) days. Administered by [facility]    Historical Provider, MD  HYDROcodone-acetaminophen (NORCO/VICODIN) 5-325 MG per tablet Place 1-2 tablets into feeding tube every 6 (six) hours as needed for severe pain. 09/10/14   Nat Christen, PA-C  levothyroxine (SYNTHROID, LEVOTHROID) 100 MCG tablet Place 1 tablet (100 mcg total) into feeding tube daily before breakfast. 09/10/14   Nat Christen, PA-C  loratadine (CLARITIN) 10 MG tablet Place 1 tablet (10 mg total) into feeding tube daily. 09/10/14   Nat Christen, PA-C  lovastatin (MEVACOR) 20 MG tablet Place 1 tablet (20 mg total) into feeding tube daily. 09/10/14   Nat Christen, PA-C  Nutritional Supplements (FEEDING SUPPLEMENT, JEVITY 1.5 CAL/FIBER,) LIQD  Place 1,000 mLs into feeding tube continuous. Initiate nocturnal feedings of Jevity 1.5 @ 100 ml/hr via PEG over 14 hour period 09/10/14   Nat Christen, PA-C  oxybutynin (DITROPAN) 5 MG tablet Place 1 tablet (5 mg total) into feeding tube 2 (two) times daily. 09/10/14   Nat Christen, PA-C   polyethylene glycol powder (GLYCOLAX/MIRALAX) powder Place 17 g into feeding tube daily as needed. Dissolved in 8 ounces of water/juice daily 09/10/14   Nat Christen, PA-C   BP 97/62 mmHg  Pulse 74  Temp(Src) 98.2 F (36.8 C) (Oral)  Resp 16  Ht 5\' 9"  (1.753 m)  Wt 125 lb (56.7 kg)  BMI 18.45 kg/m2  SpO2 98% Physical Exam  Constitutional: He is oriented to person, place, and time. He appears well-developed and well-nourished. No distress.  HENT:  Head: Normocephalic and atraumatic.  Neck: Normal range of motion. Neck supple.  Cardiovascular: Normal rate, regular rhythm and normal heart sounds.   No murmur heard. Pulmonary/Chest: Effort normal and breath sounds normal. No respiratory distress. He has no wheezes.  Abdominal: Soft. Bowel sounds are normal. He exhibits no distension. There is no tenderness.  The PEG tube site appears clean with no purulent drainage. There is a small quantity of clotted blood at the opening.  The colostomy itself appears clean and not concerning for infection.  The midline abdominal wound has gauze in place. There is slight drainage on the gauze, however there is no surrounding erythema or purulent drainage. I see only granulation tissue within the wound itself.  Neurological: He is alert and oriented to person, place, and time.  Skin: Skin is warm and dry. He is not diaphoretic.  Nursing note and vitals reviewed.   ED Course  Procedures (including critical care time) Labs Review Labs Reviewed - No data to display  Imaging Review No results found. I have personally reviewed and evaluated these images and lab results as part of my medical decision-making.   EKG Interpretation None      MDM   Final diagnoses:  None    I do not feel as though what I am seeing is infection and do not feel as though CT scan, surgical consultation, or antibiotics are indicated at this time. I will recommend continued wet-to-dry dressings and follow-up with  surgery for any problems.    Veryl Speak, MD 09/15/14 [phone]

## 2014-09-15 NOTE — ED Notes (Signed)
Per [facility] pt is not going to be picked up by staff from [place]. Admin called and refused to transport pt. Pt given snacks and drinks. Watching tv at present.

## 2014-09-15 NOTE — Discharge Instructions (Signed)
Continue wet to dry dressings as before.  If you have further concerns about this surgical incision or ostomy site, please arrange an appointment with your general surgeon to have this evaluated.   Wound Care Wound care helps prevent pain and infection.  You may need a tetanus shot if:  You cannot remember when you had your last tetanus shot.  You have never had a tetanus shot.  The injury broke your skin. If you need a tetanus shot and you choose not to have one, you may get tetanus. Sickness from tetanus can be serious. HOME CARE   Only take medicine as told by your doctor.  Clean the wound daily with mild soap and water.  Change any bandages (dressings) as told by your doctor.  Put medicated cream and a bandage on the wound as told by your doctor.  Change the bandage if it gets wet, dirty, or starts to smell.  Take showers. Do not take baths, swim, or do anything that puts your wound under water.  Rest and raise (elevate) the wound until the pain and puffiness (swelling) are better.  Keep all doctor visits as told. GET HELP RIGHT AWAY IF:   Yellowish-white fluid (pus) comes from the wound.  Medicine does not lessen your pain.  There is a red streak going away from the wound.  You have a fever. MAKE SURE YOU:   Understand these instructions.  Will watch your condition.  Will get help right away if you are not doing well or get worse. Document Released: 10/05/2007 Document Revised: 03/20/2011 Document Reviewed: 05/01/2010 [facility] Patient Information 2015 [place], [place]. This information is not intended to replace advice given to you by your health care provider. Make sure you discuss any questions you have with your health care provider.

## 2014-09-15 NOTE — ED Notes (Signed)
Pt a resident at [facility] facility. Facility sent pt to have colostomy opening checked forr infection. Per EMS pt does not want to be here. VSS. Pt denies pain. No S&S infection at site.

## 2014-09-15 NOTE — ED Notes (Signed)
Awaiting transportation for pt.

## 2014-09-15 NOTE — ED Notes (Signed)
Colostomy bag emptied. Approx 100 cc soft stool.

## 2014-09-16 DIAGNOSIS — Z433 Encounter for attention to colostomy: Secondary | ICD-10-CM | POA: Diagnosis not present

## 2014-09-16 DIAGNOSIS — J189 Pneumonia, unspecified organism: Secondary | ICD-10-CM | POA: Diagnosis not present

## 2014-09-16 DIAGNOSIS — Z8673 Personal history of transient ischemic attack (TIA), and cerebral infarction without residual deficits: Secondary | ICD-10-CM | POA: Diagnosis not present

## 2014-09-16 DIAGNOSIS — H109 Unspecified conjunctivitis: Secondary | ICD-10-CM | POA: Diagnosis not present

## 2014-09-16 DIAGNOSIS — D62 Acute posthemorrhagic anemia: Secondary | ICD-10-CM | POA: Diagnosis not present

## 2014-09-16 DIAGNOSIS — E7800 Pure hypercholesterolemia, unspecified: Secondary | ICD-10-CM | POA: Diagnosis present

## 2014-09-16 DIAGNOSIS — K460 Unspecified abdominal hernia with obstruction, without gangrene: Secondary | ICD-10-CM | POA: Diagnosis not present

## 2014-09-16 DIAGNOSIS — J9600 Acute respiratory failure, unspecified whether with hypoxia or hypercapnia: Secondary | ICD-10-CM | POA: Diagnosis not present

## 2014-09-16 DIAGNOSIS — R0902 Hypoxemia: Secondary | ICD-10-CM | POA: Diagnosis not present

## 2014-09-16 DIAGNOSIS — F209 Schizophrenia, unspecified: Secondary | ICD-10-CM | POA: Diagnosis present

## 2014-09-16 DIAGNOSIS — Z4682 Encounter for fitting and adjustment of non-vascular catheter: Secondary | ICD-10-CM | POA: Diagnosis not present

## 2014-09-16 DIAGNOSIS — Z431 Encounter for attention to gastrostomy: Secondary | ICD-10-CM | POA: Diagnosis not present

## 2014-09-16 DIAGNOSIS — E785 Hyperlipidemia, unspecified: Secondary | ICD-10-CM | POA: Diagnosis not present

## 2014-09-16 DIAGNOSIS — R251 Tremor, unspecified: Secondary | ICD-10-CM | POA: Diagnosis not present

## 2014-09-16 DIAGNOSIS — N179 Acute kidney failure, unspecified: Secondary | ICD-10-CM | POA: Diagnosis not present

## 2014-09-16 DIAGNOSIS — G2119 Other drug induced secondary parkinsonism: Secondary | ICD-10-CM | POA: Diagnosis not present

## 2014-09-16 DIAGNOSIS — M6281 Muscle weakness (generalized): Secondary | ICD-10-CM | POA: Diagnosis not present

## 2014-09-16 DIAGNOSIS — R1310 Dysphagia, unspecified: Secondary | ICD-10-CM | POA: Diagnosis present

## 2014-09-16 DIAGNOSIS — K9422 Gastrostomy infection: Secondary | ICD-10-CM | POA: Diagnosis not present

## 2014-09-16 DIAGNOSIS — E162 Hypoglycemia, unspecified: Secondary | ICD-10-CM | POA: Diagnosis not present

## 2014-09-16 DIAGNOSIS — R51 Headache: Secondary | ICD-10-CM | POA: Diagnosis not present

## 2014-09-16 DIAGNOSIS — R569 Unspecified convulsions: Secondary | ICD-10-CM | POA: Diagnosis not present

## 2014-09-16 DIAGNOSIS — K4030 Unilateral inguinal hernia, with obstruction, without gangrene, not specified as recurrent: Secondary | ICD-10-CM | POA: Diagnosis not present

## 2014-09-16 DIAGNOSIS — R700 Elevated erythrocyte sedimentation rate: Secondary | ICD-10-CM | POA: Diagnosis not present

## 2014-09-16 DIAGNOSIS — E038 Other specified hypothyroidism: Secondary | ICD-10-CM | POA: Diagnosis not present

## 2014-09-16 DIAGNOSIS — E46 Unspecified protein-calorie malnutrition: Secondary | ICD-10-CM | POA: Diagnosis not present

## 2014-09-16 DIAGNOSIS — R6521 Severe sepsis with septic shock: Secondary | ICD-10-CM | POA: Diagnosis not present

## 2014-09-16 DIAGNOSIS — E872 Acidosis: Secondary | ICD-10-CM | POA: Diagnosis not present

## 2014-09-16 DIAGNOSIS — M79603 Pain in arm, unspecified: Secondary | ICD-10-CM | POA: Diagnosis not present

## 2014-09-16 DIAGNOSIS — D509 Iron deficiency anemia, unspecified: Secondary | ICD-10-CM | POA: Diagnosis not present

## 2014-09-16 DIAGNOSIS — D72829 Elevated white blood cell count, unspecified: Secondary | ICD-10-CM | POA: Diagnosis not present

## 2014-09-16 DIAGNOSIS — T8189XA Other complications of procedures, not elsewhere classified, initial encounter: Secondary | ICD-10-CM | POA: Diagnosis not present

## 2014-09-16 DIAGNOSIS — T17308A Unspecified foreign body in larynx causing other injury, initial encounter: Secondary | ICD-10-CM | POA: Diagnosis not present

## 2014-09-16 DIAGNOSIS — R682 Dry mouth, unspecified: Secondary | ICD-10-CM | POA: Diagnosis not present

## 2014-09-16 DIAGNOSIS — J309 Allergic rhinitis, unspecified: Secondary | ICD-10-CM | POA: Diagnosis not present

## 2014-09-16 DIAGNOSIS — Z87891 Personal history of nicotine dependence: Secondary | ICD-10-CM | POA: Diagnosis not present

## 2014-09-16 DIAGNOSIS — Z48815 Encounter for surgical aftercare following surgery on the digestive system: Secondary | ICD-10-CM | POA: Diagnosis not present

## 2014-09-16 DIAGNOSIS — E079 Disorder of thyroid, unspecified: Secondary | ICD-10-CM | POA: Diagnosis not present

## 2014-09-16 DIAGNOSIS — A419 Sepsis, unspecified organism: Secondary | ICD-10-CM | POA: Diagnosis present

## 2014-09-16 DIAGNOSIS — H9190 Unspecified hearing loss, unspecified ear: Secondary | ICD-10-CM | POA: Diagnosis present

## 2014-09-16 DIAGNOSIS — G40909 Epilepsy, unspecified, not intractable, without status epilepticus: Secondary | ICD-10-CM | POA: Diagnosis not present

## 2014-09-16 DIAGNOSIS — R6510 Systemic inflammatory response syndrome (SIRS) of non-infectious origin without acute organ dysfunction: Secondary | ICD-10-CM | POA: Diagnosis not present

## 2014-09-16 DIAGNOSIS — E43 Unspecified severe protein-calorie malnutrition: Secondary | ICD-10-CM | POA: Diagnosis present

## 2014-09-16 DIAGNOSIS — R41841 Cognitive communication deficit: Secondary | ICD-10-CM | POA: Diagnosis not present

## 2014-09-16 DIAGNOSIS — E039 Hypothyroidism, unspecified: Secondary | ICD-10-CM | POA: Diagnosis present

## 2014-09-16 DIAGNOSIS — R531 Weakness: Secondary | ICD-10-CM | POA: Diagnosis present

## 2014-09-16 DIAGNOSIS — D696 Thrombocytopenia, unspecified: Secondary | ICD-10-CM | POA: Diagnosis not present

## 2014-09-16 DIAGNOSIS — Z931 Gastrostomy status: Secondary | ICD-10-CM | POA: Diagnosis not present

## 2014-09-16 DIAGNOSIS — F0390 Unspecified dementia without behavioral disturbance: Secondary | ICD-10-CM | POA: Diagnosis present

## 2014-09-16 DIAGNOSIS — J690 Pneumonitis due to inhalation of food and vomit: Secondary | ICD-10-CM | POA: Diagnosis present

## 2014-09-16 NOTE — ED Notes (Signed)
Spoke with a lady from [facility] and she is going to go to Ms. [place] to have her call the ER.

## 2014-09-16 NOTE — ED Notes (Signed)
Per [facility], patient will remain in the ER until in the morning when social services can evaluate him for placement at another facility.

## 2014-09-16 NOTE — ED Provider Notes (Signed)
1:30 AM  Pt seen early today by Dr. Stark Jock and discharged.  Plan was to discharge patient back to his group home. Group home refusing to take patient back. We have attempted to contact social work at [employer] for assistance. Social work at [employer] in the morning will need to help with placement to send patient back to his group home ([facility]) versus finding placement at another facility.  [place], DO 09/16/14 0157

## 2014-09-16 NOTE — ED Notes (Signed)
Spoke with Ms. Steven David and she states that she will come and get him.

## 2014-09-17 DIAGNOSIS — R531 Weakness: Secondary | ICD-10-CM | POA: Diagnosis not present

## 2014-09-17 DIAGNOSIS — F209 Schizophrenia, unspecified: Secondary | ICD-10-CM | POA: Diagnosis not present

## 2014-09-17 DIAGNOSIS — R569 Unspecified convulsions: Secondary | ICD-10-CM | POA: Diagnosis not present

## 2014-09-17 DIAGNOSIS — E079 Disorder of thyroid, unspecified: Secondary | ICD-10-CM | POA: Diagnosis not present

## 2014-09-17 DIAGNOSIS — R682 Dry mouth, unspecified: Secondary | ICD-10-CM | POA: Diagnosis not present

## 2014-09-17 DIAGNOSIS — J309 Allergic rhinitis, unspecified: Secondary | ICD-10-CM | POA: Diagnosis not present

## 2014-09-17 DIAGNOSIS — E46 Unspecified protein-calorie malnutrition: Secondary | ICD-10-CM | POA: Diagnosis not present

## 2014-09-17 DIAGNOSIS — R1310 Dysphagia, unspecified: Secondary | ICD-10-CM | POA: Diagnosis not present

## 2014-09-18 DIAGNOSIS — E079 Disorder of thyroid, unspecified: Secondary | ICD-10-CM | POA: Diagnosis not present

## 2014-09-18 DIAGNOSIS — J309 Allergic rhinitis, unspecified: Secondary | ICD-10-CM | POA: Diagnosis not present

## 2014-09-18 DIAGNOSIS — E46 Unspecified protein-calorie malnutrition: Secondary | ICD-10-CM | POA: Diagnosis not present

## 2014-09-18 DIAGNOSIS — F209 Schizophrenia, unspecified: Secondary | ICD-10-CM | POA: Diagnosis not present

## 2014-09-18 DIAGNOSIS — R569 Unspecified convulsions: Secondary | ICD-10-CM | POA: Diagnosis not present

## 2014-09-18 DIAGNOSIS — R682 Dry mouth, unspecified: Secondary | ICD-10-CM | POA: Diagnosis not present

## 2014-09-18 DIAGNOSIS — R531 Weakness: Secondary | ICD-10-CM | POA: Diagnosis not present

## 2014-09-18 DIAGNOSIS — R1310 Dysphagia, unspecified: Secondary | ICD-10-CM | POA: Diagnosis not present

## 2014-09-19 DIAGNOSIS — R682 Dry mouth, unspecified: Secondary | ICD-10-CM | POA: Diagnosis not present

## 2014-09-19 DIAGNOSIS — E079 Disorder of thyroid, unspecified: Secondary | ICD-10-CM | POA: Diagnosis not present

## 2014-09-19 DIAGNOSIS — R531 Weakness: Secondary | ICD-10-CM | POA: Diagnosis not present

## 2014-09-19 DIAGNOSIS — E46 Unspecified protein-calorie malnutrition: Secondary | ICD-10-CM | POA: Diagnosis not present

## 2014-09-19 DIAGNOSIS — J309 Allergic rhinitis, unspecified: Secondary | ICD-10-CM | POA: Diagnosis not present

## 2014-09-19 DIAGNOSIS — R1310 Dysphagia, unspecified: Secondary | ICD-10-CM | POA: Diagnosis not present

## 2014-09-19 DIAGNOSIS — F209 Schizophrenia, unspecified: Secondary | ICD-10-CM | POA: Diagnosis not present

## 2014-09-19 DIAGNOSIS — R569 Unspecified convulsions: Secondary | ICD-10-CM | POA: Diagnosis not present

## 2014-09-23 DIAGNOSIS — T8189XA Other complications of procedures, not elsewhere classified, initial encounter: Secondary | ICD-10-CM | POA: Diagnosis not present

## 2014-09-23 DIAGNOSIS — R682 Dry mouth, unspecified: Secondary | ICD-10-CM | POA: Diagnosis not present

## 2014-09-23 DIAGNOSIS — F209 Schizophrenia, unspecified: Secondary | ICD-10-CM | POA: Diagnosis not present

## 2014-09-23 DIAGNOSIS — R569 Unspecified convulsions: Secondary | ICD-10-CM | POA: Diagnosis not present

## 2014-09-24 ENCOUNTER — Other Ambulatory Visit (HOSPITAL_COMMUNITY): Payer: Self-pay | Admitting: Internal Medicine

## 2014-09-24 DIAGNOSIS — R682 Dry mouth, unspecified: Secondary | ICD-10-CM | POA: Diagnosis not present

## 2014-09-24 DIAGNOSIS — R1310 Dysphagia, unspecified: Secondary | ICD-10-CM

## 2014-09-24 DIAGNOSIS — T8189XA Other complications of procedures, not elsewhere classified, initial encounter: Secondary | ICD-10-CM | POA: Diagnosis not present

## 2014-09-24 DIAGNOSIS — F209 Schizophrenia, unspecified: Secondary | ICD-10-CM | POA: Diagnosis not present

## 2014-09-24 DIAGNOSIS — R569 Unspecified convulsions: Secondary | ICD-10-CM | POA: Diagnosis not present

## 2014-09-25 ENCOUNTER — Ambulatory Visit (INDEPENDENT_AMBULATORY_CARE_PROVIDER_SITE_OTHER): Payer: Medicare Other | Admitting: Diagnostic Neuroimaging

## 2014-09-25 ENCOUNTER — Encounter: Payer: Self-pay | Admitting: Diagnostic Neuroimaging

## 2014-09-25 VITALS — BP 87/55 | HR 76 | Wt 113.0 lb

## 2014-09-25 DIAGNOSIS — G2119 Other drug induced secondary parkinsonism: Secondary | ICD-10-CM

## 2014-09-25 DIAGNOSIS — G40909 Epilepsy, unspecified, not intractable, without status epilepticus: Secondary | ICD-10-CM

## 2014-09-25 NOTE — Progress Notes (Signed)
[facility]  PATIENT: Steven David DOB: 17-Oct-1955  REFERRING CLINICIAN: Jomarie Longs, PA HISTORY FROM: chart review, patient, CMA from [facility] VISIT: new consult    HISTORICAL  CHIEF COMPLAINT:  Chief Complaint  Patient presents with  . Seizures    rm 6, New Patient, hospital FU    HISTORY OF PRESENT ILLNESS:   59 year old male here for follow-up of seizure. Patient has history of schizophrenia, hypothyroidism, previously living in a group home, then admitted to the hospital for incarcerated inguinal hernia status post sigmoid colectomy, sigmoid colostomy, repair of strangulated hernia. Patient had witnessed seizure postoperatively. Patient was started on Keppra and then transitioned to Depakote.  Since discharge no further seizures.    REVIEW OF SYSTEMS: Full 14 system review of systems performed and notable only for as per history of present illness.  ALLERGIES: No Known Allergies  HOME MEDICATIONS: Outpatient Prescriptions Prior to Visit  Medication Sig Dispense Refill  . divalproex (DEPAKOTE SPRINKLE) 125 MG capsule Take 4 capsules (500 mg total) by mouth every 12 (twelve) hours. 60 capsule 0  . haloperidol (HALDOL) 10 MG tablet Place 1 tablet (10 mg total) into feeding tube every evening.    Marland Kitchen levothyroxine (SYNTHROID, LEVOTHROID) 100 MCG tablet Place 1 tablet (100 mcg total) into feeding tube daily before breakfast.    . loratadine (CLARITIN) 10 MG tablet Place 1 tablet (10 mg total) into feeding tube daily.    Marland Kitchen lovastatin (MEVACOR) 20 MG tablet Place 1 tablet (20 mg total) into feeding tube daily.    . Nutritional Supplements (FEEDING SUPPLEMENT, JEVITY 1.5 CAL/FIBER,) LIQD Place 1,000 mLs into feeding tube continuous. Initiate nocturnal feedings of Jevity 1.5 @ 100 ml/hr via PEG over 14 hour period    . oxybutynin (DITROPAN) 5 MG tablet Place 1 tablet (5 mg total) into feeding tube 2 (two) times daily. 60 tablet 0  .  polyethylene glycol powder (GLYCOLAX/MIRALAX) powder Place 17 g into feeding tube daily as needed. Dissolved in 8 ounces of water/juice daily 255 g 0  . haloperidol decanoate (HALDOL DECANOATE) 100 MG/ML injection Inject 100 mg into the muscle every 28 (twenty-eight) days. Administered by [facility]    . HYDROcodone-acetaminophen (NORCO/VICODIN) 5-325 MG per tablet Place 1-2 tablets into feeding tube every 6 (six) hours as needed for severe pain. (Patient not taking: Reported on 09/25/2014) 30 tablet 0   No facility-administered medications prior to visit.    PAST MEDICAL HISTORY: Past Medical History  Diagnosis Date  . Schizophrenia   . Hernia   . Thyroid disease   . High cholesterol   . HOH (hard of hearing)     PAST SURGICAL HISTORY: Past Surgical History  Procedure Laterality Date  . Leg surgery    . Testicle surgery    . Laparotomy N/A 09/01/2014    Procedure: EXPLORATORY LAPAROTOMY, SIGMOID COLECTOMY, SIGMOID COLOSTOMY, PRIMARY REPAIR OF STRANGULATED INGUINAL HERNIA.;  Surgeon: Rolm Bookbinder, MD;  Location: [place];  Service: General;  Laterality: N/A;  . Esophagogastroduodenoscopy (egd) with propofol N/A 09/08/2014    Procedure: ESOPHAGOGASTRODUODENOSCOPY (EGD) WITH PROPOFOL;  Surgeon: Georganna Skeans, MD;  Location: [place];  Service: Endoscopy;  Laterality: N/A;  . Peg placement N/A 09/08/2014    Procedure: PERCUTANEOUS ENDOSCOPIC GASTROSTOMY (PEG) PLACEMENT;  Surgeon: Georganna Skeans, MD;  Location: [place];  Service: Endoscopy;  Laterality: N/A;    FAMILY HISTORY: Family History  Problem Relation Age of Onset  . Family history unknown: Yes    SOCIAL HISTORY:  Social History   Social History  . Marital Status: Single    Spouse Name: N/A  . Number of Children: N/A  . Years of Education: N/A   Occupational History  . Not on file.   Social History Main Topics  . Smoking status: Former Research scientist (life sciences)  . Smokeless tobacco: Not on file  . Alcohol Use: No  . Drug  Use: No  . Sexual Activity: Not on file   Other Topics Concern  . Not on file   Social History Narrative     PHYSICAL EXAM  GENERAL EXAM/CONSTITUTIONAL: Vitals:  Filed Vitals:   09/25/14 1012  BP: 87/55  Pulse: 76  Weight: 113 lb (51.256 kg)     Body mass index is 16.68 kg/(m^2).  No exam data present  Patient is in no distress; well developed, nourished and groomed; neck is supple  CARDIOVASCULAR:  Examination of carotid arteries is normal; no carotid bruits  Regular rate and rhythm, no murmurs  Examination of peripheral vascular system by observation and palpation is normal  EYES:  Ophthalmoscopic exam of optic discs and posterior segments is normal; no papilledema or hemorrhages  MUSCULOSKELETAL:  Gait, strength, tone, movements noted in Neurologic exam below  NEUROLOGIC: MENTAL STATUS:  No flowsheet data found.  awake, alert, oriented to person, place and time  [facility] memory   DECR attention and concentration  DECR FLUENCY; comprehension intact, naming intact,   DECR fund of knowledge   CRANIAL NERVE:   2nd - no papilledema on fundoscopic exam  2nd, 3rd, 4th, 6th - pupils equal and reactive to light, visual fields full to confrontation, extraocular muscles --> EXOTROPIA, no nystagmus  5th - facial sensation symmetric  7th - facial strength symmetric  8th - hearing intact  9th - palate elevates symmetrically, uvula midline  11th - shoulder shrug symmetric  12th - tongue protrusion midline  MOTOR:   RESTING TREMOR IN HANDS AND MOUTH; NO BRADYKINESIA; NO COGWHEELING; normal bulk and tone, full strength in the BUE, BLE  SENSORY:   normal and symmetric to light touch, temperature, vibration   COORDINATION:   finger-nose-finger, fine finger movements, SLOW  REFLEXES:   deep tendon reflexes present and symmetric  GAIT/STATION:   IN WHEELCHAIR; STANDS SLOWLY; STOOPED POSTURE; SMALL STEPS; HAND TREMOR WITH WALKING; REDUCED ARM  SWING    DIAGNOSTIC DATA (LABS, IMAGING, TESTING) - I reviewed patient records, labs, notes, testing and imaging myself where available.  Lab Results  Component Value Date   WBC 11.1* 09/09/2014   HGB 9.5* 09/09/2014   HCT 31.0* 09/09/2014   MCV 93.1 09/09/2014   PLT 103* 09/09/2014      Component Value Date/Time   NA 148* 09/09/2014 0410   K 3.6 09/09/2014 0410   CL 111 09/09/2014 0410   CO2 32 09/09/2014 0410   GLUCOSE 110* 09/09/2014 0410   BUN 6 09/09/2014 0410   CREATININE 0.71 09/09/2014 0410   CALCIUM 8.1* 09/09/2014 0410   PROT 4.6* 09/02/2014 0446   ALBUMIN 2.3* 09/02/2014 0930   AST 34 09/02/2014 0446   ALT 18 09/02/2014 0446   ALKPHOS 36* 09/02/2014 0446   BILITOT 1.2 09/02/2014 0446   GFRNONAA >60 09/09/2014 0410   GFRAA >60 09/09/2014 0410   Lab Results  Component Value Date   CHOL 95 04/12/2013   HDL 37* 04/12/2013   LDLCALC 48 04/12/2013   TRIG 40 09/01/2014   CHOLHDL 2.6 04/12/2013   Lab Results  Component Value Date   HGBA1C 5.6 04/12/2013  No results found for: VITAMINB12 Lab Results  Component Value Date   TSH 0.955 09/03/2014    09/02/14 CT head [I reviewed images myself and agree with interpretation. -VRP]  - No acute intracranial abnormality. No significant change. Mild cerebral atrophy. Stable small lacunar infarct in left caudate nucleus.   04/11/13 MRI brain [I reviewed images myself and agree with interpretation. -VRP]  - Premature atrophy, small vessel disease, and evidence for remote cerebral infarction. No acute intracranial findings.  09/02/14 EEG  - This is a normal EEG recording during wakefulness and during sleep. No evidence of an epileptic disorder was demonstrated. However, a normal EEG recording in and of itself does not rule out seizure disorder.  04/19/13 EEG  - This is an unremarkable recording awake and drowsy states.     ASSESSMENT AND PLAN  59 y.o. year old male here with history of schizophrenia, with  recent hospitalization for strangulated hernia and gastric volvulus, status post colectomy, with postoperative seizure. Patient now on Depakote doing well. Will continue for now as patient is stable.  Also with signs and symptoms of drug induced parkinsonism, likely related to current haldol usage.  Dx:   Seizure disorder  Drug-induced Parkinsonism    PLAN: - continue depakote 500mg  BID  Return in about 4 months (around 01/25/2015).    Penni Bombard, MD 2/29/7989, 21:19 AM Certified in Neurology, Neurophysiology and Neuroimaging  [facility] [address] [place], [place] [address] [phone]

## 2014-09-25 NOTE — Patient Instructions (Signed)
Continue divalproex 500mg  twice a day.

## 2014-09-27 DIAGNOSIS — R682 Dry mouth, unspecified: Secondary | ICD-10-CM | POA: Diagnosis not present

## 2014-09-27 DIAGNOSIS — T8189XA Other complications of procedures, not elsewhere classified, initial encounter: Secondary | ICD-10-CM | POA: Diagnosis not present

## 2014-09-27 DIAGNOSIS — R569 Unspecified convulsions: Secondary | ICD-10-CM | POA: Diagnosis not present

## 2014-09-27 DIAGNOSIS — F209 Schizophrenia, unspecified: Secondary | ICD-10-CM | POA: Diagnosis not present

## 2014-09-28 DIAGNOSIS — E079 Disorder of thyroid, unspecified: Secondary | ICD-10-CM | POA: Diagnosis not present

## 2014-09-30 ENCOUNTER — Ambulatory Visit (HOSPITAL_COMMUNITY): Payer: No Typology Code available for payment source | Admitting: Speech Pathology

## 2014-09-30 ENCOUNTER — Ambulatory Visit (HOSPITAL_COMMUNITY)
Admission: RE | Admit: 2014-09-30 | Discharge: 2014-09-30 | Disposition: A | Discharge status: Home / Self Care | Payer: No Typology Code available for payment source | Source: Ambulatory Visit | Attending: Internal Medicine | Admitting: Internal Medicine

## 2014-09-30 DIAGNOSIS — R1314 Dysphagia, pharyngoesophageal phase: Secondary | ICD-10-CM

## 2014-09-30 DIAGNOSIS — Z4682 Encounter for fitting and adjustment of non-vascular catheter: Secondary | ICD-10-CM | POA: Diagnosis not present

## 2014-09-30 DIAGNOSIS — R1310 Dysphagia, unspecified: Secondary | ICD-10-CM | POA: Insufficient documentation

## 2014-09-30 DIAGNOSIS — T17308A Unspecified foreign body in larynx causing other injury, initial encounter: Secondary | ICD-10-CM | POA: Diagnosis not present

## 2014-09-30 NOTE — Therapy (Signed)
[place], [place], [address] Phone: [phone]   Fax:  [phone]  Modified Barium Swallow  Patient Details  Name: Steven David MRN: [id number] Date of Birth: 1955-08-30 Referring Provider:  Jani Gravel, MD  Encounter Date: 09/30/2014      End of Session - 09/30/14 2114    Visit Number 1   Number of Visits 1   Authorization Type Medicare   SLP Start Time 1310   SLP Stop Time  1340   SLP Time Calculation (min) 30 min   Activity Tolerance Patient tolerated treatment well      Past Medical History  Diagnosis Date  . Schizophrenia   . Hernia   . Thyroid disease   . High cholesterol   . HOH (hard of hearing)     Past Surgical History  Procedure Laterality Date  . Leg surgery    . Testicle surgery    . Laparotomy N/A 09/01/2014    Procedure: EXPLORATORY LAPAROTOMY, SIGMOID COLECTOMY, SIGMOID COLOSTOMY, PRIMARY REPAIR OF STRANGULATED INGUINAL HERNIA.;  Surgeon: Rolm Bookbinder, MD;  Location: [place];  Service: General;  Laterality: N/A;  . Esophagogastroduodenoscopy (egd) with propofol N/A 09/08/2014    Procedure: ESOPHAGOGASTRODUODENOSCOPY (EGD) WITH PROPOFOL;  Surgeon: Georganna Skeans, MD;  Location: [place];  Service: Endoscopy;  Laterality: N/A;  . Peg placement N/A 09/08/2014    Procedure: PERCUTANEOUS ENDOSCOPIC GASTROSTOMY (PEG) PLACEMENT;  Surgeon: Georganna Skeans, MD;  Location: [place];  Service: Endoscopy;  Laterality: N/A;    There were no vitals filed for this visit.  Visit Diagnosis: Dysphagia, pharyngoesophageal phase      Subjective Assessment - 09/30/14 1530    Subjective "How did I do?"   Special Tests MBSS   Currently in Pain? No/denies             General - 09/30/14 1531    General Information   Date of Onset 09/30/14   Other Pertinent Information 59 yo group home member(scizophrenic)with long standing LIH, who was taken to OR 8/22 with strangulated LIH,  necrotic bowel which required, Sigmoid colectomy/colostomy. Intubated from 8/23-8/24. Pt has a history of severe dysphagia. with gross silent aspiration of residual  on last MBS 5/26 and pna consuming Dys3/nectar diet, though he has continued this diet with risk of aspiration. He was referred by Dr. Maudie Mercury for MBSS as pt is currently residing at [facility] for rehab post recent hospitalization.   Type of Study Other (Comment)  MBSS   Reason for Referral Objectively evaluate swallowing function   Previous Swallow Assessment August 2016 NPO with PEG placed   Diet Prior to this Study NPO;PEG tube   Temperature Spikes Noted No   Respiratory Status Room air   History of Recent Intubation Yes   Length of Intubations (days) 2 days   Date extubated 09/02/14   Behavior/Cognition Alert;Cooperative;Pleasant mood   Oral Cavity - Dentition Edentulous   Oral Motor / Sensory Function Impaired   Self-Feeding Abilities Able to feed self   Patient Positioning Upright in chair/Tumbleform   Baseline Vocal Quality Breathy  hypernasal   Volitional Cough Weak   Volitional Swallow Able to elicit   Anatomy Within functional limits   Pharyngeal Secretions Not observed secondary MBS            Oral Preparation/Oral Phase - 09/30/14 1534    Oral Preparation/Oral Phase   Oral Phase Impaired   Oral - Honey   Oral - Honey Cup Oral residue;Delayed A-P  transit;Piecemeal swallowing;Weak ligual manipulation   Oral - Nectar   Oral - Nectar Cup Oral residue;Piecemeal swallowing;Weak ligual manipulation;Delayed A-P transit   Oral - Solids   Oral - Puree Oral residue;Weak ligual manipulation;Piecemeal swallowing   Oral - Mechanical Soft Piecemeal swallowing;Delayed A-P transit;Decreased bolus cohesion;Oral residue   Electrical stimulation - Oral Phase   Was Electrical Stimulation Used No          Pharyngeal Phase - 09/30/14 1535    Pharyngeal Phase   Pharyngeal Phase Impaired   Pharyngeal - Honey   Pharyngeal-  Honey Cup Pharyngeal residue - valleculae;Swallow initiation at pyriform sinus;Delayed swallow initiation;Reduced epiglottic inversion;Reduced anterior laryngeal mobility;Reduced laryngeal elevation;Reduced tongue base retraction;Lateral channel residue;Pharyngeal residue - pyriform   Pharyngeal - Nectar   Pharyngeal- Nectar Cup Pharyngeal residue - valleculae;Swallow initiation at pyriform sinus;Delayed swallow initiation;Reduced epiglottic inversion;Reduced anterior laryngeal mobility;Reduced laryngeal elevation;Reduced tongue base retraction;Pharyngeal residue - pyriform;Lateral channel residue;Moderate aspiration;Penetration/Aspiration during swallow;Reduced airway/laryngeal closure   Pharyngeal - Solids   Pharyngeal- Puree Pharyngeal residue - valleculae;Swallow initiation at vallecula;Delayed swallow initiation;Reduced pharyngeal peristalsis;Reduced epiglottic inversion;Reduced anterior laryngeal mobility;Reduced laryngeal elevation;Reduced tongue base retraction;Pharyngeal residue - posterior pharnyx   Pharyngeal- Mechanical Soft Pharyngeal residue - valleculae;Swallow initiation at vallecula;Delayed swallow initiation;Reduced pharyngeal peristalsis;Reduced epiglottic inversion;Reduced anterior laryngeal mobility;Reduced laryngeal elevation;Reduced tongue base retraction   Electrical Stimulation - Pharyngeal Phase   Was Electrical Stimulation Used No          Cricopharyngeal Phase - 09/30/14 2113    Cervical Esophageal Phase   Cervical Esophageal Phase Within functional limits           Plan - 09/30/14 2115    Clinical Impression Statement Pt presents with functionally severe pharyngeal phase dysphagia and mod oral phase with both sensory and motor deficits. Pt assessed in the lateral position with nectar, honey-thick, puree, and small bite of mech soft. Oral phase is marked by significantly delayed oral transit and piecemeal swallow. Pt held bolus in mouth for up to 25 seconds and  required mod/max tactile and verbal cues to "move your tongue, smack your lips, swallow..." with liquid trials. He stated that it was due to distaste of barium.   Pt with premature spillage/delay in swallow initiation across all consistencies, filling the valleculae and spilling to pyriforms, decreased tongue base retraction, hyolaryngeal excursion, epiglottic deflection, and vocal fold closure resulting in gross silent aspiration of nectars during the swallow (delayed, ineffective, weak cough), penetration of honey thick liquids and severe residuals post swallow in the valleculae and mild scatter throughout pharynx. Pt was cued to repeat swallow and implement effortful swallow, however residuals were never completely removed from pharynx. Risk for aspiration over the course of a meal is judged to be extremely high given decreased sensation, significant residuals, global pharyngeal weakness, and weak cough. Recommend NPO with alternative means of nutrition (has PEG); see below  Please note that I saw this pt about 18 months ago for MBSS and results were similar to today. At that time, pt was living in a group home and continued to consume a mechanical soft diet and NTL. He now has a feeding tube and has a strong desire to eat by mouth. Pt will need to have a discussion with MD and caregivers to determine goals of care- ie. Allow po diet with assumption of risks for aspiration. If pt wishes to consume po diet with safest diet recommendation in mind (still at great risk for aspiration), would consider D3/mech soft (pt benefited from mastication) and honey-thick liquids.  Will fax to facility and PCP (Dr. Luan Pulling).          G-Codes - 17-Oct-2014 1536    Functional Assessment Tool Used mbss   Functional Limitations Swallowing   Swallow Current Status [phone]) At least 80 percent but less than 100 percent impaired, limited or restricted   Swallow Goal Status (C3837) At least 80 percent but less than 100 percent  impaired, limited or restricted   Swallow Discharge Status [phone]) At least 80 percent but less than 100 percent impaired, limited or restricted          Problem List Patient Active Problem List   Diagnosis Date Noted  . Postoperative anemia due to acute blood loss 09/07/2014  . Seizure   . Incarcerated inguinal hernia 09/01/2014  . Respiratory failure, acute 09/01/2014  . Metabolic acidosis 88/64/8472  . Incarcerated hernia 09/01/2014  . Septic shock 09/01/2014  . AKI (acute kidney injury)   . Protein-calorie malnutrition, severe 04/13/2013  . Sepsis 04/11/2013  . H/O stroke within last year 04/11/2013  . Hypothyroidism 04/11/2013  . Pneumonia 04/11/2013  . Thrombocytopenia 04/11/2013  . Schizophrenia 04/11/2013  . Malnutrition 04/11/2013  . SIRS (systemic inflammatory response syndrome) 04/11/2013   Thank you,  Genene Churn, [place]  [facility] 17-Oct-2014, 9:23 PM  [place] [address] [place], [place], [address] Phone: [phone]   Fax:  [phone]

## 2014-10-01 DIAGNOSIS — T8189XA Other complications of procedures, not elsewhere classified, initial encounter: Secondary | ICD-10-CM | POA: Diagnosis not present

## 2014-10-01 DIAGNOSIS — R531 Weakness: Secondary | ICD-10-CM | POA: Diagnosis not present

## 2014-10-01 DIAGNOSIS — R682 Dry mouth, unspecified: Secondary | ICD-10-CM | POA: Diagnosis not present

## 2014-10-01 DIAGNOSIS — D696 Thrombocytopenia, unspecified: Secondary | ICD-10-CM | POA: Diagnosis not present

## 2014-10-03 DIAGNOSIS — R531 Weakness: Secondary | ICD-10-CM | POA: Diagnosis not present

## 2014-10-03 DIAGNOSIS — R682 Dry mouth, unspecified: Secondary | ICD-10-CM | POA: Diagnosis not present

## 2014-10-03 DIAGNOSIS — R1310 Dysphagia, unspecified: Secondary | ICD-10-CM | POA: Diagnosis not present

## 2014-10-03 DIAGNOSIS — T8189XA Other complications of procedures, not elsewhere classified, initial encounter: Secondary | ICD-10-CM | POA: Diagnosis not present

## 2014-10-05 DIAGNOSIS — R700 Elevated erythrocyte sedimentation rate: Secondary | ICD-10-CM | POA: Diagnosis not present

## 2014-10-05 DIAGNOSIS — D62 Acute posthemorrhagic anemia: Secondary | ICD-10-CM | POA: Diagnosis not present

## 2014-10-05 DIAGNOSIS — T8189XA Other complications of procedures, not elsewhere classified, initial encounter: Secondary | ICD-10-CM | POA: Diagnosis not present

## 2014-10-05 DIAGNOSIS — F209 Schizophrenia, unspecified: Secondary | ICD-10-CM | POA: Diagnosis not present

## 2014-10-11 DIAGNOSIS — D72829 Elevated white blood cell count, unspecified: Secondary | ICD-10-CM | POA: Diagnosis not present

## 2014-10-11 DIAGNOSIS — T8189XA Other complications of procedures, not elsewhere classified, initial encounter: Secondary | ICD-10-CM | POA: Diagnosis not present

## 2014-10-12 DIAGNOSIS — T8189XA Other complications of procedures, not elsewhere classified, initial encounter: Secondary | ICD-10-CM | POA: Diagnosis not present

## 2014-10-12 DIAGNOSIS — D72829 Elevated white blood cell count, unspecified: Secondary | ICD-10-CM | POA: Diagnosis not present

## 2014-10-12 DIAGNOSIS — D696 Thrombocytopenia, unspecified: Secondary | ICD-10-CM | POA: Diagnosis not present

## 2014-10-12 DIAGNOSIS — R700 Elevated erythrocyte sedimentation rate: Secondary | ICD-10-CM | POA: Diagnosis not present

## 2014-10-13 DIAGNOSIS — D696 Thrombocytopenia, unspecified: Secondary | ICD-10-CM | POA: Diagnosis not present

## 2014-10-13 DIAGNOSIS — R1310 Dysphagia, unspecified: Secondary | ICD-10-CM | POA: Diagnosis not present

## 2014-10-13 DIAGNOSIS — T8189XA Other complications of procedures, not elsewhere classified, initial encounter: Secondary | ICD-10-CM | POA: Diagnosis not present

## 2014-10-13 DIAGNOSIS — E46 Unspecified protein-calorie malnutrition: Secondary | ICD-10-CM | POA: Diagnosis not present

## 2014-10-13 DIAGNOSIS — D509 Iron deficiency anemia, unspecified: Secondary | ICD-10-CM | POA: Diagnosis not present

## 2014-10-13 DIAGNOSIS — D72829 Elevated white blood cell count, unspecified: Secondary | ICD-10-CM | POA: Diagnosis not present

## 2014-10-15 DIAGNOSIS — D509 Iron deficiency anemia, unspecified: Secondary | ICD-10-CM | POA: Diagnosis not present

## 2014-10-15 DIAGNOSIS — T8189XA Other complications of procedures, not elsewhere classified, initial encounter: Secondary | ICD-10-CM | POA: Diagnosis not present

## 2014-10-15 DIAGNOSIS — D72829 Elevated white blood cell count, unspecified: Secondary | ICD-10-CM | POA: Diagnosis not present

## 2014-10-15 DIAGNOSIS — R1310 Dysphagia, unspecified: Secondary | ICD-10-CM | POA: Diagnosis not present

## 2014-10-15 DIAGNOSIS — D696 Thrombocytopenia, unspecified: Secondary | ICD-10-CM | POA: Diagnosis not present

## 2014-10-15 DIAGNOSIS — E46 Unspecified protein-calorie malnutrition: Secondary | ICD-10-CM | POA: Diagnosis not present

## 2014-10-22 DIAGNOSIS — T8189XA Other complications of procedures, not elsewhere classified, initial encounter: Secondary | ICD-10-CM | POA: Diagnosis not present

## 2014-10-22 DIAGNOSIS — K9422 Gastrostomy infection: Secondary | ICD-10-CM | POA: Diagnosis not present

## 2014-10-22 DIAGNOSIS — R1310 Dysphagia, unspecified: Secondary | ICD-10-CM | POA: Diagnosis not present

## 2014-10-22 DIAGNOSIS — D509 Iron deficiency anemia, unspecified: Secondary | ICD-10-CM | POA: Diagnosis not present

## 2014-10-22 DIAGNOSIS — E079 Disorder of thyroid, unspecified: Secondary | ICD-10-CM | POA: Diagnosis not present

## 2014-10-22 DIAGNOSIS — D72829 Elevated white blood cell count, unspecified: Secondary | ICD-10-CM | POA: Diagnosis not present

## 2014-10-26 DIAGNOSIS — R1310 Dysphagia, unspecified: Secondary | ICD-10-CM | POA: Diagnosis not present

## 2014-10-26 DIAGNOSIS — R51 Headache: Secondary | ICD-10-CM | POA: Diagnosis not present

## 2014-10-26 DIAGNOSIS — D509 Iron deficiency anemia, unspecified: Secondary | ICD-10-CM | POA: Diagnosis not present

## 2014-10-26 DIAGNOSIS — D72829 Elevated white blood cell count, unspecified: Secondary | ICD-10-CM | POA: Diagnosis not present

## 2014-10-26 DIAGNOSIS — D696 Thrombocytopenia, unspecified: Secondary | ICD-10-CM | POA: Diagnosis not present

## 2014-10-29 DIAGNOSIS — R1310 Dysphagia, unspecified: Secondary | ICD-10-CM | POA: Diagnosis not present

## 2014-10-29 DIAGNOSIS — D696 Thrombocytopenia, unspecified: Secondary | ICD-10-CM | POA: Diagnosis not present

## 2014-10-29 DIAGNOSIS — R51 Headache: Secondary | ICD-10-CM | POA: Diagnosis not present

## 2014-10-29 DIAGNOSIS — D72829 Elevated white blood cell count, unspecified: Secondary | ICD-10-CM | POA: Diagnosis not present

## 2014-10-29 DIAGNOSIS — D509 Iron deficiency anemia, unspecified: Secondary | ICD-10-CM | POA: Diagnosis not present

## 2014-11-05 DIAGNOSIS — R1310 Dysphagia, unspecified: Secondary | ICD-10-CM | POA: Diagnosis not present

## 2014-11-05 DIAGNOSIS — D509 Iron deficiency anemia, unspecified: Secondary | ICD-10-CM | POA: Diagnosis not present

## 2014-11-05 DIAGNOSIS — D72829 Elevated white blood cell count, unspecified: Secondary | ICD-10-CM | POA: Diagnosis not present

## 2014-11-05 DIAGNOSIS — K9422 Gastrostomy infection: Secondary | ICD-10-CM | POA: Diagnosis not present

## 2014-11-05 DIAGNOSIS — T8189XA Other complications of procedures, not elsewhere classified, initial encounter: Secondary | ICD-10-CM | POA: Diagnosis not present

## 2014-11-05 DIAGNOSIS — E079 Disorder of thyroid, unspecified: Secondary | ICD-10-CM | POA: Diagnosis not present

## 2014-11-07 DIAGNOSIS — K9422 Gastrostomy infection: Secondary | ICD-10-CM | POA: Diagnosis not present

## 2014-11-07 DIAGNOSIS — D509 Iron deficiency anemia, unspecified: Secondary | ICD-10-CM | POA: Diagnosis not present

## 2014-11-07 DIAGNOSIS — R1310 Dysphagia, unspecified: Secondary | ICD-10-CM | POA: Diagnosis not present

## 2014-11-07 DIAGNOSIS — D72829 Elevated white blood cell count, unspecified: Secondary | ICD-10-CM | POA: Diagnosis not present

## 2014-11-07 DIAGNOSIS — T8189XA Other complications of procedures, not elsewhere classified, initial encounter: Secondary | ICD-10-CM | POA: Diagnosis not present

## 2014-11-07 DIAGNOSIS — E079 Disorder of thyroid, unspecified: Secondary | ICD-10-CM | POA: Diagnosis not present

## 2014-11-09 DIAGNOSIS — K9422 Gastrostomy infection: Secondary | ICD-10-CM | POA: Diagnosis not present

## 2014-11-10 DIAGNOSIS — E039 Hypothyroidism, unspecified: Secondary | ICD-10-CM | POA: Diagnosis present

## 2014-11-10 DIAGNOSIS — J9600 Acute respiratory failure, unspecified whether with hypoxia or hypercapnia: Secondary | ICD-10-CM | POA: Diagnosis not present

## 2014-11-10 DIAGNOSIS — K4030 Unilateral inguinal hernia, with obstruction, without gangrene, not specified as recurrent: Secondary | ICD-10-CM | POA: Diagnosis not present

## 2014-11-10 DIAGNOSIS — J690 Pneumonitis due to inhalation of food and vomit: Secondary | ICD-10-CM | POA: Diagnosis present

## 2014-11-10 DIAGNOSIS — E46 Unspecified protein-calorie malnutrition: Secondary | ICD-10-CM | POA: Diagnosis not present

## 2014-11-10 DIAGNOSIS — M6281 Muscle weakness (generalized): Secondary | ICD-10-CM | POA: Diagnosis not present

## 2014-11-10 DIAGNOSIS — E038 Other specified hypothyroidism: Secondary | ICD-10-CM | POA: Diagnosis not present

## 2014-11-10 DIAGNOSIS — H109 Unspecified conjunctivitis: Secondary | ICD-10-CM | POA: Diagnosis not present

## 2014-11-10 DIAGNOSIS — K460 Unspecified abdominal hernia with obstruction, without gangrene: Secondary | ICD-10-CM | POA: Diagnosis not present

## 2014-11-10 DIAGNOSIS — D62 Acute posthemorrhagic anemia: Secondary | ICD-10-CM | POA: Diagnosis not present

## 2014-11-10 DIAGNOSIS — A419 Sepsis, unspecified organism: Secondary | ICD-10-CM | POA: Diagnosis present

## 2014-11-10 DIAGNOSIS — F0390 Unspecified dementia without behavioral disturbance: Secondary | ICD-10-CM | POA: Diagnosis present

## 2014-11-10 DIAGNOSIS — D696 Thrombocytopenia, unspecified: Secondary | ICD-10-CM | POA: Diagnosis not present

## 2014-11-10 DIAGNOSIS — M79603 Pain in arm, unspecified: Secondary | ICD-10-CM | POA: Diagnosis not present

## 2014-11-10 DIAGNOSIS — E43 Unspecified severe protein-calorie malnutrition: Secondary | ICD-10-CM | POA: Diagnosis present

## 2014-11-10 DIAGNOSIS — R1310 Dysphagia, unspecified: Secondary | ICD-10-CM | POA: Diagnosis present

## 2014-11-10 DIAGNOSIS — R0902 Hypoxemia: Secondary | ICD-10-CM | POA: Diagnosis not present

## 2014-11-10 DIAGNOSIS — R569 Unspecified convulsions: Secondary | ICD-10-CM | POA: Diagnosis not present

## 2014-11-10 DIAGNOSIS — H9190 Unspecified hearing loss, unspecified ear: Secondary | ICD-10-CM | POA: Diagnosis present

## 2014-11-10 DIAGNOSIS — D509 Iron deficiency anemia, unspecified: Secondary | ICD-10-CM | POA: Diagnosis not present

## 2014-11-10 DIAGNOSIS — Z8673 Personal history of transient ischemic attack (TIA), and cerebral infarction without residual deficits: Secondary | ICD-10-CM | POA: Diagnosis not present

## 2014-11-10 DIAGNOSIS — J189 Pneumonia, unspecified organism: Secondary | ICD-10-CM | POA: Diagnosis not present

## 2014-11-10 DIAGNOSIS — R251 Tremor, unspecified: Secondary | ICD-10-CM | POA: Diagnosis not present

## 2014-11-10 DIAGNOSIS — F209 Schizophrenia, unspecified: Secondary | ICD-10-CM | POA: Diagnosis present

## 2014-11-10 DIAGNOSIS — R6521 Severe sepsis with septic shock: Secondary | ICD-10-CM | POA: Diagnosis not present

## 2014-11-10 DIAGNOSIS — R41841 Cognitive communication deficit: Secondary | ICD-10-CM | POA: Diagnosis not present

## 2014-11-10 DIAGNOSIS — E7800 Pure hypercholesterolemia, unspecified: Secondary | ICD-10-CM | POA: Diagnosis present

## 2014-11-10 DIAGNOSIS — R531 Weakness: Secondary | ICD-10-CM | POA: Diagnosis present

## 2014-11-10 DIAGNOSIS — E162 Hypoglycemia, unspecified: Secondary | ICD-10-CM | POA: Diagnosis not present

## 2014-11-10 DIAGNOSIS — E872 Acidosis: Secondary | ICD-10-CM | POA: Diagnosis not present

## 2014-11-10 DIAGNOSIS — K9422 Gastrostomy infection: Secondary | ICD-10-CM | POA: Diagnosis not present

## 2014-11-10 DIAGNOSIS — R6510 Systemic inflammatory response syndrome (SIRS) of non-infectious origin without acute organ dysfunction: Secondary | ICD-10-CM | POA: Diagnosis not present

## 2014-11-10 DIAGNOSIS — D72829 Elevated white blood cell count, unspecified: Secondary | ICD-10-CM | POA: Diagnosis not present

## 2014-11-10 DIAGNOSIS — Z931 Gastrostomy status: Secondary | ICD-10-CM | POA: Diagnosis not present

## 2014-11-10 DIAGNOSIS — N179 Acute kidney failure, unspecified: Secondary | ICD-10-CM | POA: Diagnosis not present

## 2014-11-10 DIAGNOSIS — G40909 Epilepsy, unspecified, not intractable, without status epilepticus: Secondary | ICD-10-CM | POA: Diagnosis present

## 2014-11-10 DIAGNOSIS — Z87891 Personal history of nicotine dependence: Secondary | ICD-10-CM | POA: Diagnosis not present

## 2014-11-10 DIAGNOSIS — E785 Hyperlipidemia, unspecified: Secondary | ICD-10-CM | POA: Diagnosis not present

## 2014-11-10 DIAGNOSIS — G2119 Other drug induced secondary parkinsonism: Secondary | ICD-10-CM | POA: Diagnosis present

## 2014-11-11 DIAGNOSIS — K9422 Gastrostomy infection: Secondary | ICD-10-CM | POA: Diagnosis not present

## 2014-11-11 DIAGNOSIS — D509 Iron deficiency anemia, unspecified: Secondary | ICD-10-CM | POA: Diagnosis not present

## 2014-11-11 DIAGNOSIS — E162 Hypoglycemia, unspecified: Secondary | ICD-10-CM | POA: Diagnosis not present

## 2014-11-11 DIAGNOSIS — D72829 Elevated white blood cell count, unspecified: Secondary | ICD-10-CM | POA: Diagnosis not present

## 2014-11-11 DIAGNOSIS — R569 Unspecified convulsions: Secondary | ICD-10-CM | POA: Diagnosis not present

## 2014-11-12 DIAGNOSIS — D72829 Elevated white blood cell count, unspecified: Secondary | ICD-10-CM | POA: Diagnosis not present

## 2014-11-12 DIAGNOSIS — R569 Unspecified convulsions: Secondary | ICD-10-CM | POA: Diagnosis not present

## 2014-11-12 DIAGNOSIS — K9422 Gastrostomy infection: Secondary | ICD-10-CM | POA: Diagnosis not present

## 2014-11-12 DIAGNOSIS — E162 Hypoglycemia, unspecified: Secondary | ICD-10-CM | POA: Diagnosis not present

## 2014-11-12 DIAGNOSIS — D509 Iron deficiency anemia, unspecified: Secondary | ICD-10-CM | POA: Diagnosis not present

## 2014-11-14 DIAGNOSIS — E162 Hypoglycemia, unspecified: Secondary | ICD-10-CM | POA: Diagnosis not present

## 2014-11-14 DIAGNOSIS — D509 Iron deficiency anemia, unspecified: Secondary | ICD-10-CM | POA: Diagnosis not present

## 2014-11-14 DIAGNOSIS — R569 Unspecified convulsions: Secondary | ICD-10-CM | POA: Diagnosis not present

## 2014-11-14 DIAGNOSIS — K9422 Gastrostomy infection: Secondary | ICD-10-CM | POA: Diagnosis not present

## 2014-11-14 DIAGNOSIS — D72829 Elevated white blood cell count, unspecified: Secondary | ICD-10-CM | POA: Diagnosis not present

## 2014-11-18 ENCOUNTER — Encounter (HOSPITAL_COMMUNITY): Payer: Self-pay

## 2014-11-18 ENCOUNTER — Inpatient Hospital Stay (HOSPITAL_COMMUNITY)
Admission: EM | Admit: 2014-11-18 | Discharge: 2014-11-22 | DRG: 871 | Disposition: A | Discharge status: Skilled Nursing Facility | Payer: Medicare Other | Attending: Pulmonary Disease | Admitting: Pulmonary Disease

## 2014-11-18 ENCOUNTER — Emergency Department (HOSPITAL_COMMUNITY): Payer: Medicare Other

## 2014-11-18 DIAGNOSIS — R41841 Cognitive communication deficit: Secondary | ICD-10-CM | POA: Diagnosis not present

## 2014-11-18 DIAGNOSIS — Z7401 Bed confinement status: Secondary | ICD-10-CM | POA: Diagnosis not present

## 2014-11-18 DIAGNOSIS — J690 Pneumonitis due to inhalation of food and vomit: Secondary | ICD-10-CM | POA: Diagnosis present

## 2014-11-18 DIAGNOSIS — I639 Cerebral infarction, unspecified: Secondary | ICD-10-CM | POA: Diagnosis not present

## 2014-11-18 DIAGNOSIS — J189 Pneumonia, unspecified organism: Secondary | ICD-10-CM

## 2014-11-18 DIAGNOSIS — Z452 Encounter for adjustment and management of vascular access device: Secondary | ICD-10-CM | POA: Diagnosis not present

## 2014-11-18 DIAGNOSIS — R569 Unspecified convulsions: Secondary | ICD-10-CM | POA: Diagnosis not present

## 2014-11-18 DIAGNOSIS — H9190 Unspecified hearing loss, unspecified ear: Secondary | ICD-10-CM | POA: Diagnosis present

## 2014-11-18 DIAGNOSIS — D696 Thrombocytopenia, unspecified: Secondary | ICD-10-CM | POA: Diagnosis not present

## 2014-11-18 DIAGNOSIS — F209 Schizophrenia, unspecified: Secondary | ICD-10-CM | POA: Diagnosis present

## 2014-11-18 DIAGNOSIS — A419 Sepsis, unspecified organism: Principal | ICD-10-CM | POA: Diagnosis present

## 2014-11-18 DIAGNOSIS — F0390 Unspecified dementia without behavioral disturbance: Secondary | ICD-10-CM | POA: Diagnosis present

## 2014-11-18 DIAGNOSIS — K460 Unspecified abdominal hernia with obstruction, without gangrene: Secondary | ICD-10-CM | POA: Diagnosis not present

## 2014-11-18 DIAGNOSIS — E039 Hypothyroidism, unspecified: Secondary | ICD-10-CM | POA: Diagnosis present

## 2014-11-18 DIAGNOSIS — R1310 Dysphagia, unspecified: Secondary | ICD-10-CM | POA: Diagnosis present

## 2014-11-18 DIAGNOSIS — M6281 Muscle weakness (generalized): Secondary | ICD-10-CM | POA: Diagnosis not present

## 2014-11-18 DIAGNOSIS — Z87891 Personal history of nicotine dependence: Secondary | ICD-10-CM | POA: Diagnosis not present

## 2014-11-18 DIAGNOSIS — R0902 Hypoxemia: Secondary | ICD-10-CM

## 2014-11-18 DIAGNOSIS — G40909 Epilepsy, unspecified, not intractable, without status epilepticus: Secondary | ICD-10-CM | POA: Diagnosis present

## 2014-11-18 DIAGNOSIS — E440 Moderate protein-calorie malnutrition: Secondary | ICD-10-CM

## 2014-11-18 DIAGNOSIS — R6510 Systemic inflammatory response syndrome (SIRS) of non-infectious origin without acute organ dysfunction: Secondary | ICD-10-CM | POA: Diagnosis not present

## 2014-11-18 DIAGNOSIS — R6521 Severe sepsis with septic shock: Secondary | ICD-10-CM | POA: Diagnosis not present

## 2014-11-18 DIAGNOSIS — G2119 Other drug induced secondary parkinsonism: Secondary | ICD-10-CM | POA: Diagnosis present

## 2014-11-18 DIAGNOSIS — Z931 Gastrostomy status: Secondary | ICD-10-CM

## 2014-11-18 DIAGNOSIS — D62 Acute posthemorrhagic anemia: Secondary | ICD-10-CM | POA: Diagnosis not present

## 2014-11-18 DIAGNOSIS — E7800 Pure hypercholesterolemia, unspecified: Secondary | ICD-10-CM | POA: Diagnosis present

## 2014-11-18 DIAGNOSIS — E872 Acidosis: Secondary | ICD-10-CM | POA: Diagnosis not present

## 2014-11-18 DIAGNOSIS — E46 Unspecified protein-calorie malnutrition: Secondary | ICD-10-CM | POA: Diagnosis not present

## 2014-11-18 DIAGNOSIS — K4030 Unilateral inguinal hernia, with obstruction, without gangrene, not specified as recurrent: Secondary | ICD-10-CM | POA: Diagnosis not present

## 2014-11-18 DIAGNOSIS — E785 Hyperlipidemia, unspecified: Secondary | ICD-10-CM | POA: Diagnosis not present

## 2014-11-18 DIAGNOSIS — R251 Tremor, unspecified: Secondary | ICD-10-CM | POA: Diagnosis not present

## 2014-11-18 DIAGNOSIS — J9600 Acute respiratory failure, unspecified whether with hypoxia or hypercapnia: Secondary | ICD-10-CM | POA: Diagnosis not present

## 2014-11-18 DIAGNOSIS — E43 Unspecified severe protein-calorie malnutrition: Secondary | ICD-10-CM | POA: Diagnosis present

## 2014-11-18 DIAGNOSIS — K9422 Gastrostomy infection: Secondary | ICD-10-CM | POA: Diagnosis not present

## 2014-11-18 DIAGNOSIS — Z9981 Dependence on supplemental oxygen: Secondary | ICD-10-CM | POA: Diagnosis not present

## 2014-11-18 DIAGNOSIS — Z8673 Personal history of transient ischemic attack (TIA), and cerebral infarction without residual deficits: Secondary | ICD-10-CM | POA: Diagnosis not present

## 2014-11-18 DIAGNOSIS — E038 Other specified hypothyroidism: Secondary | ICD-10-CM | POA: Diagnosis not present

## 2014-11-18 DIAGNOSIS — H109 Unspecified conjunctivitis: Secondary | ICD-10-CM | POA: Diagnosis not present

## 2014-11-18 DIAGNOSIS — N179 Acute kidney failure, unspecified: Secondary | ICD-10-CM | POA: Diagnosis not present

## 2014-11-18 DIAGNOSIS — R531 Weakness: Secondary | ICD-10-CM | POA: Diagnosis not present

## 2014-11-18 HISTORY — DX: Unspecified convulsions: R56.9

## 2014-11-18 HISTORY — DX: Dysphagia, unspecified: R13.10

## 2014-11-18 HISTORY — DX: Transient cerebral ischemic attack, unspecified: G45.9

## 2014-11-18 HISTORY — DX: Unspecified protein-calorie malnutrition: E46

## 2014-11-18 HISTORY — DX: Other drug induced secondary parkinsonism: G21.19

## 2014-11-18 HISTORY — DX: Cognitive communication deficit: R41.841

## 2014-11-18 NOTE — ED Notes (Signed)
Pt is resident of [facility] where staff was reportedly concerned about pt with weakness and tremors to his arms.  Pt only c/o "arm pain"   Pt appears to be at his normal according to paperwork from the facility

## 2014-11-19 ENCOUNTER — Ambulatory Visit (INDEPENDENT_AMBULATORY_CARE_PROVIDER_SITE_OTHER): Payer: Self-pay | Admitting: Otolaryngology

## 2014-11-19 ENCOUNTER — Other Ambulatory Visit (HOSPITAL_COMMUNITY): Payer: Self-pay

## 2014-11-19 ENCOUNTER — Encounter (HOSPITAL_COMMUNITY): Payer: Self-pay

## 2014-11-19 ENCOUNTER — Emergency Department (HOSPITAL_COMMUNITY): Payer: Medicare Other

## 2014-11-19 DIAGNOSIS — F0390 Unspecified dementia without behavioral disturbance: Secondary | ICD-10-CM | POA: Diagnosis present

## 2014-11-19 DIAGNOSIS — Z452 Encounter for adjustment and management of vascular access device: Secondary | ICD-10-CM | POA: Diagnosis not present

## 2014-11-19 DIAGNOSIS — K9422 Gastrostomy infection: Secondary | ICD-10-CM | POA: Diagnosis not present

## 2014-11-19 DIAGNOSIS — H109 Unspecified conjunctivitis: Secondary | ICD-10-CM | POA: Diagnosis not present

## 2014-11-19 DIAGNOSIS — E46 Unspecified protein-calorie malnutrition: Secondary | ICD-10-CM | POA: Diagnosis not present

## 2014-11-19 DIAGNOSIS — D696 Thrombocytopenia, unspecified: Secondary | ICD-10-CM | POA: Diagnosis not present

## 2014-11-19 DIAGNOSIS — H9190 Unspecified hearing loss, unspecified ear: Secondary | ICD-10-CM | POA: Diagnosis present

## 2014-11-19 DIAGNOSIS — Z8673 Personal history of transient ischemic attack (TIA), and cerebral infarction without residual deficits: Secondary | ICD-10-CM | POA: Diagnosis not present

## 2014-11-19 DIAGNOSIS — N179 Acute kidney failure, unspecified: Secondary | ICD-10-CM | POA: Diagnosis not present

## 2014-11-19 DIAGNOSIS — R6521 Severe sepsis with septic shock: Secondary | ICD-10-CM | POA: Diagnosis not present

## 2014-11-19 DIAGNOSIS — A419 Sepsis, unspecified organism: Secondary | ICD-10-CM | POA: Diagnosis not present

## 2014-11-19 DIAGNOSIS — E039 Hypothyroidism, unspecified: Secondary | ICD-10-CM | POA: Diagnosis present

## 2014-11-19 DIAGNOSIS — E038 Other specified hypothyroidism: Secondary | ICD-10-CM | POA: Diagnosis not present

## 2014-11-19 DIAGNOSIS — K460 Unspecified abdominal hernia with obstruction, without gangrene: Secondary | ICD-10-CM | POA: Diagnosis not present

## 2014-11-19 DIAGNOSIS — E43 Unspecified severe protein-calorie malnutrition: Secondary | ICD-10-CM | POA: Diagnosis present

## 2014-11-19 DIAGNOSIS — Z931 Gastrostomy status: Secondary | ICD-10-CM | POA: Diagnosis not present

## 2014-11-19 DIAGNOSIS — J189 Pneumonia, unspecified organism: Secondary | ICD-10-CM

## 2014-11-19 DIAGNOSIS — R41841 Cognitive communication deficit: Secondary | ICD-10-CM | POA: Diagnosis not present

## 2014-11-19 DIAGNOSIS — R6510 Systemic inflammatory response syndrome (SIRS) of non-infectious origin without acute organ dysfunction: Secondary | ICD-10-CM | POA: Diagnosis not present

## 2014-11-19 DIAGNOSIS — F209 Schizophrenia, unspecified: Secondary | ICD-10-CM | POA: Diagnosis present

## 2014-11-19 DIAGNOSIS — R1310 Dysphagia, unspecified: Secondary | ICD-10-CM | POA: Diagnosis not present

## 2014-11-19 DIAGNOSIS — E872 Acidosis: Secondary | ICD-10-CM | POA: Diagnosis not present

## 2014-11-19 DIAGNOSIS — G2119 Other drug induced secondary parkinsonism: Secondary | ICD-10-CM | POA: Diagnosis present

## 2014-11-19 DIAGNOSIS — E7800 Pure hypercholesterolemia, unspecified: Secondary | ICD-10-CM | POA: Diagnosis present

## 2014-11-19 DIAGNOSIS — Z9981 Dependence on supplemental oxygen: Secondary | ICD-10-CM | POA: Diagnosis not present

## 2014-11-19 DIAGNOSIS — J690 Pneumonitis due to inhalation of food and vomit: Secondary | ICD-10-CM | POA: Diagnosis not present

## 2014-11-19 DIAGNOSIS — E785 Hyperlipidemia, unspecified: Secondary | ICD-10-CM | POA: Diagnosis not present

## 2014-11-19 DIAGNOSIS — Z7401 Bed confinement status: Secondary | ICD-10-CM | POA: Diagnosis not present

## 2014-11-19 DIAGNOSIS — R569 Unspecified convulsions: Secondary | ICD-10-CM | POA: Diagnosis not present

## 2014-11-19 DIAGNOSIS — J9600 Acute respiratory failure, unspecified whether with hypoxia or hypercapnia: Secondary | ICD-10-CM | POA: Diagnosis not present

## 2014-11-19 DIAGNOSIS — I639 Cerebral infarction, unspecified: Secondary | ICD-10-CM | POA: Diagnosis not present

## 2014-11-19 DIAGNOSIS — Z87891 Personal history of nicotine dependence: Secondary | ICD-10-CM | POA: Diagnosis not present

## 2014-11-19 DIAGNOSIS — K4030 Unilateral inguinal hernia, with obstruction, without gangrene, not specified as recurrent: Secondary | ICD-10-CM | POA: Diagnosis not present

## 2014-11-19 DIAGNOSIS — R531 Weakness: Secondary | ICD-10-CM | POA: Diagnosis not present

## 2014-11-19 DIAGNOSIS — M6281 Muscle weakness (generalized): Secondary | ICD-10-CM | POA: Diagnosis not present

## 2014-11-19 DIAGNOSIS — G40909 Epilepsy, unspecified, not intractable, without status epilepticus: Secondary | ICD-10-CM | POA: Diagnosis present

## 2014-11-19 DIAGNOSIS — R251 Tremor, unspecified: Secondary | ICD-10-CM | POA: Diagnosis not present

## 2014-11-19 DIAGNOSIS — D62 Acute posthemorrhagic anemia: Secondary | ICD-10-CM | POA: Diagnosis not present

## 2014-11-19 LAB — URINALYSIS, ROUTINE W REFLEX MICROSCOPIC
BILIRUBIN URINE: NEGATIVE
Glucose, UA: NEGATIVE mg/dL
Ketones, ur: NEGATIVE mg/dL
LEUKOCYTES UA: NEGATIVE
NITRITE: NEGATIVE
Protein, ur: NEGATIVE mg/dL
SPECIFIC GRAVITY, URINE: 1.015 (ref 1.005–1.030)
UROBILINOGEN UA: 1 mg/dL (ref 0.0–1.0)
pH: 8.5 — ABNORMAL HIGH (ref 5.0–8.0)

## 2014-11-19 LAB — CBC WITH DIFFERENTIAL/PLATELET
BASOS PCT: 0 %
Basophils Absolute: 0 10*3/uL (ref 0.0–0.1)
EOS ABS: 0.2 10*3/uL (ref 0.0–0.7)
Eosinophils Relative: 2 %
HCT: 33.8 % — ABNORMAL LOW (ref 39.0–52.0)
Hemoglobin: 10.4 g/dL — ABNORMAL LOW (ref 13.0–17.0)
LYMPHS PCT: 13 %
Lymphs Abs: 1.6 10*3/uL (ref 0.7–4.0)
MCH: 26.5 pg (ref 26.0–34.0)
MCHC: 30.8 g/dL (ref 30.0–36.0)
MCV: 86 fL (ref 78.0–100.0)
MONO ABS: 1.2 10*3/uL — AB (ref 0.1–1.0)
Monocytes Relative: 9 %
NEUTROS ABS: 9.8 10*3/uL — AB (ref 1.7–7.7)
Neutrophils Relative %: 76 %
PLATELETS: 109 10*3/uL — AB (ref 150–400)
RBC: 3.93 MIL/uL — ABNORMAL LOW (ref 4.22–5.81)
RDW: 15.9 % — AB (ref 11.5–15.5)
Smear Review: ADEQUATE
WBC: 12.9 10*3/uL — ABNORMAL HIGH (ref 4.0–10.5)

## 2014-11-19 LAB — CBC
HCT: 32.9 % — ABNORMAL LOW (ref 39.0–52.0)
HEMOGLOBIN: 10.1 g/dL — AB (ref 13.0–17.0)
MCH: 26.4 pg (ref 26.0–34.0)
MCHC: 30.7 g/dL (ref 30.0–36.0)
MCV: 86.1 fL (ref 78.0–100.0)
PLATELETS: 100 10*3/uL — AB (ref 150–400)
RBC: 3.82 MIL/uL — ABNORMAL LOW (ref 4.22–5.81)
RDW: 15.9 % — ABNORMAL HIGH (ref 11.5–15.5)
WBC: 11.2 10*3/uL — ABNORMAL HIGH (ref 4.0–10.5)

## 2014-11-19 LAB — BASIC METABOLIC PANEL
Anion gap: 9 (ref 5–15)
BUN: 22 mg/dL — AB (ref 6–20)
CO2: 29 mmol/L (ref 22–32)
Calcium: 9.2 mg/dL (ref 8.9–10.3)
Chloride: 103 mmol/L (ref 101–111)
Creatinine, Ser: 1.06 mg/dL (ref 0.61–1.24)
GFR calc Af Amer: >60 mL/min (ref >60)
GLUCOSE: 109 mg/dL — AB (ref 65–99)
POTASSIUM: 4.1 mmol/L (ref 3.5–5.1)
Sodium: 141 mmol/L (ref 135–145)

## 2014-11-19 LAB — TROPONIN I: Troponin I: <0.03 ng/mL (ref <0.031)

## 2014-11-19 LAB — COMPREHENSIVE METABOLIC PANEL
ALBUMIN: 3.6 g/dL (ref 3.5–5.0)
ALK PHOS: 65 U/L (ref 38–126)
ALT: 9 U/L — AB (ref 17–63)
ANION GAP: 9 (ref 5–15)
AST: 16 U/L (ref 15–41)
BUN: 24 mg/dL — AB (ref 6–20)
CALCIUM: 9.6 mg/dL (ref 8.9–10.3)
CO2: 31 mmol/L (ref 22–32)
Chloride: 101 mmol/L (ref 101–111)
Creatinine, Ser: 1.11 mg/dL (ref 0.61–1.24)
GFR calc Af Amer: >60 mL/min (ref >60)
GFR calc non Af Amer: >60 mL/min (ref >60)
GLUCOSE: 92 mg/dL (ref 65–99)
Potassium: 4 mmol/L (ref 3.5–5.1)
SODIUM: 141 mmol/L (ref 135–145)
Total Bilirubin: 0.4 mg/dL (ref 0.3–1.2)
Total Protein: 8.5 g/dL — ABNORMAL HIGH (ref 6.5–8.1)

## 2014-11-19 LAB — URINE MICROSCOPIC-ADD ON

## 2014-11-19 LAB — TSH: TSH: 0.695 u[IU]/mL (ref 0.350–4.500)

## 2014-11-19 LAB — LACTIC ACID, PLASMA
LACTIC ACID, VENOUS: 0.5 mmol/L (ref 0.5–2.0)
Lactic Acid, Venous: 0.6 mmol/L (ref 0.5–2.0)

## 2014-11-19 LAB — VALPROIC ACID LEVEL: Valproic Acid Lvl: 14 ug/mL — ABNORMAL LOW (ref 50.0–100.0)

## 2014-11-19 LAB — MRSA PCR SCREENING: MRSA by PCR: NEGATIVE

## 2014-11-19 MED ORDER — LEVOTHYROXINE SODIUM 100 MCG PO TABS
100.0000 ug | ORAL_TABLET | Freq: Every day | ORAL | Status: DC
  Administered 2014-11-19 – 2014-11-22 (×4): 100 ug
  Filled 2014-11-19 (×5): qty 1

## 2014-11-19 MED ORDER — SODIUM CHLORIDE 0.9 % IV BOLUS (SEPSIS)
500.0000 mL | Freq: Once | INTRAVENOUS | Status: AC
  Administered 2014-11-19: 500 mL via INTRAVENOUS

## 2014-11-19 MED ORDER — VALPROATE SODIUM 500 MG/5ML IV SOLN
INTRAVENOUS | Status: AC
  Filled 2014-11-19: qty 5

## 2014-11-19 MED ORDER — PNEUMOCOCCAL VAC POLYVALENT 25 MCG/0.5ML IJ INJ
0.5000 mL | INJECTION | INTRAMUSCULAR | Status: AC
  Administered 2014-11-20: 0.5 mL via INTRAMUSCULAR
  Filled 2014-11-19: qty 0.5

## 2014-11-19 MED ORDER — ONDANSETRON HCL 4 MG PO TABS: 4.0000 mg | ORAL_TABLET | Freq: Four times a day (QID) | ORAL | Status: DC | PRN

## 2014-11-19 MED ORDER — SODIUM CHLORIDE 0.9 % IV SOLN
INTRAVENOUS | Status: DC
  Administered 2014-11-19 (×2): via INTRAVENOUS
  Administered 2014-11-20: 1000 mL via INTRAVENOUS
  Administered 2014-11-20 – 2014-11-21 (×2): via INTRAVENOUS

## 2014-11-19 MED ORDER — VANCOMYCIN HCL 500 MG IV SOLR
500.0000 mg | Freq: Two times a day (BID) | INTRAVENOUS | Status: DC
  Administered 2014-11-19 – 2014-11-20 (×2): 500 mg via INTRAVENOUS
  Filled 2014-11-19 (×3): qty 500

## 2014-11-19 MED ORDER — VALPROATE SODIUM 500 MG/5ML IV SOLN
500.0000 mg | Freq: Once | INTRAVENOUS | Status: AC
  Administered 2014-11-19: 500 mg via INTRAVENOUS
  Filled 2014-11-19: qty 5

## 2014-11-19 MED ORDER — POLYETHYLENE GLYCOL 3350 17 GM/SCOOP PO POWD
17.0000 g | Freq: Every day | ORAL | Status: DC | PRN
  Filled 2014-11-19: qty 255

## 2014-11-19 MED ORDER — ENOXAPARIN SODIUM 40 MG/0.4ML ~~LOC~~ SOLN
40.0000 mg | SUBCUTANEOUS | Status: DC
  Administered 2014-11-19 – 2014-11-20 (×2): 40 mg via SUBCUTANEOUS
  Filled 2014-11-19 (×4): qty 0.4

## 2014-11-19 MED ORDER — POLYETHYLENE GLYCOL 3350 17 G PO PACK: 17.0000 g | PACK | Freq: Every day | ORAL | Status: DC | PRN

## 2014-11-19 MED ORDER — DIVALPROEX SODIUM 125 MG PO CSDR
650.0000 mg | DELAYED_RELEASE_CAPSULE | Freq: Two times a day (BID) | ORAL | Status: DC
  Administered 2014-11-19 – 2014-11-22 (×7): 625 mg via ORAL
  Filled 2014-11-19 (×9): qty 5

## 2014-11-19 MED ORDER — VANCOMYCIN HCL IN DEXTROSE 1-5 GM/200ML-% IV SOLN
1000.0000 mg | Freq: Once | INTRAVENOUS | Status: AC
  Administered 2014-11-19: 1000 mg via INTRAVENOUS
  Filled 2014-11-19: qty 200

## 2014-11-19 MED ORDER — PIPERACILLIN-TAZOBACTAM 3.375 G IVPB
3.3750 g | Freq: Three times a day (TID) | INTRAVENOUS | Status: DC
  Administered 2014-11-19 – 2014-11-22 (×9): 3.375 g via INTRAVENOUS
  Filled 2014-11-19 (×13): qty 50

## 2014-11-19 MED ORDER — JEVITY 1.5 CAL/FIBER PO LIQD
1000.0000 mL | ORAL | Status: DC
  Administered 2014-11-19: 1000 mL
  Filled 2014-11-19 (×12): qty 1000

## 2014-11-19 MED ORDER — ONDANSETRON HCL 4 MG/2ML IJ SOLN: 4.0000 mg | Freq: Four times a day (QID) | INTRAMUSCULAR | Status: DC | PRN

## 2014-11-19 MED ORDER — CHLORHEXIDINE GLUCONATE 0.12 % MT SOLN
15.0000 mL | Freq: Two times a day (BID) | OROMUCOSAL | Status: DC
  Administered 2014-11-19 – 2014-11-22 (×6): 15 mL via OROMUCOSAL
  Filled 2014-11-19 (×6): qty 15

## 2014-11-19 MED ORDER — PIPERACILLIN-TAZOBACTAM 3.375 G IVPB 30 MIN
3.3750 g | Freq: Three times a day (TID) | INTRAVENOUS | Status: DC
  Administered 2014-11-19: 3.375 g via INTRAVENOUS
  Filled 2014-11-19 (×2): qty 50

## 2014-11-19 MED ORDER — JEVITY 1.2 CAL PO LIQD
ORAL | Status: AC
  Filled 2014-11-19: qty 237

## 2014-11-19 MED ORDER — PRAVASTATIN SODIUM 10 MG PO TABS
20.0000 mg | ORAL_TABLET | Freq: Every day | ORAL | Status: DC
  Administered 2014-11-19 – 2014-11-21 (×3): 20 mg via ORAL
  Filled 2014-11-19 (×3): qty 2

## 2014-11-19 MED ORDER — CETYLPYRIDINIUM CHLORIDE 0.05 % MT LIQD
7.0000 mL | Freq: Two times a day (BID) | OROMUCOSAL | Status: DC
  Administered 2014-11-19 – 2014-11-22 (×7): 7 mL via OROMUCOSAL

## 2014-11-19 NOTE — Progress Notes (Addendum)
ANTIBIOTIC CONSULT NOTE -  Pharmacy Consult for Vancomycin Indication: pneumonia  No Known Allergies  Patient Measurements:   Adjusted Body Weight:54.8kg   Vital Signs: Temp: 98 F (36.7 C) (11/10 0443) Temp Source: Oral (11/10 0443) BP: 130/59 mmHg (11/10 0443) Pulse Rate: 76 (11/10 0443) Intake/Output from previous day:   Intake/Output from this shift:    Labs:  Recent Labs  11/19/14 0051 11/19/14 0616  WBC 12.9* 11.2*  HGB 10.4* 10.1*  PLT 109* 100*  CREATININE 1.11 1.06   CrCl cannot be calculated (Unknown ideal weight.). No results for input(s): VANCOTROUGH, VANCOPEAK, VANCORANDOM, GENTTROUGH, GENTPEAK, GENTRANDOM, TOBRATROUGH, TOBRAPEAK, TOBRARND, AMIKACINPEAK, AMIKACINTROU, AMIKACIN in the last 72 hours.   Microbiology: No results found for this or any previous visit (from the past 720 hour(s)).  Medical History: Past Medical History  Diagnosis Date  . Schizophrenia ([place])   . Hernia   . Thyroid disease   . High cholesterol   . HOH (hard of hearing)   . Dysphagia   . Cognitive communication deficit   . Seizures ([place])   . Drug-induced Parkinsonism ([place])   . Malnutrition ([place])   . TIA (transient ischemic attack)     Medications:  Prescriptions prior to admission  Medication Sig Dispense Refill Last Dose  . cholecalciferol (VITAMIN D) 1000 UNITS tablet Take 1,000 Units by mouth daily.   Taking  . divalproex (DEPAKOTE SPRINKLE) 125 MG capsule Take 4 capsules (500 mg total) by mouth every 12 (twelve) hours. 60 capsule 0 Taking  . ferrous sulfate 325 (65 FE) MG tablet Take 325 mg by mouth daily with breakfast.     . furosemide (LASIX) 20 MG tablet Take 20 mg by mouth. 09/25/14 20 mg PO two times a day for SOB, [facility]   Taking  . haloperidol (HALDOL) 10 MG tablet Place 1 tablet (10 mg total) into feeding tube every evening.   Taking  . haloperidol decanoate (HALDOL DECANOATE) 100 MG/ML injection Inject 100 mg into the muscle every 28  (twenty-eight) days. Administered by [facility]   Past Month at Unknown time  . levETIRAcetam (KEPPRA) 250 MG tablet Take 250 mg by mouth daily.     Marland Kitchen levothyroxine (SYNTHROID, LEVOTHROID) 100 MCG tablet Place 1 tablet (100 mcg total) into feeding tube daily before breakfast.   Taking  . loratadine (CLARITIN) 10 MG tablet Place 1 tablet (10 mg total) into feeding tube daily.   Taking  . lovastatin (MEVACOR) 20 MG tablet Place 1 tablet (20 mg total) into feeding tube daily.   Taking  . Nutritional Supplements (FEEDING SUPPLEMENT, JEVITY 1.5 CAL/FIBER,) LIQD Place 1,000 mLs into feeding tube continuous. Initiate nocturnal feedings of Jevity 1.5 @ 100 ml/hr via PEG over 14 hour period   Taking  . oxybutynin (DITROPAN) 5 MG tablet Place 1 tablet (5 mg total) into feeding tube 2 (two) times daily. 60 tablet 0 Taking  . polyethylene glycol powder (GLYCOLAX/MIRALAX) powder Place 17 g into feeding tube daily as needed. Dissolved in 8 ounces of water/juice daily 255 g 0 Taking  . sulfamethoxazole-trimethoprim (BACTRIM DS,SEPTRA DS) 800-160 MG tablet Take 1 tablet by mouth 2 (two) times daily.     Marland Kitchen HYDROcodone-acetaminophen (NORCO/VICODIN) 5-325 MG per tablet Place 1-2 tablets into feeding tube every 6 (six) hours as needed for severe pain. (Patient not taking: Reported on 09/25/2014) 30 tablet 0 Not Taking   Assessment: 59 yo male admitted with aspiration pneumonia(HCAP). Admitted from NH. Patient is NPO. Empiric tx with Vancomycin and Zosyn.  Goal of Therapy:  Vancomycin trough level 15-20 mcg/ml  Plan:  Zosyn 3.375gm iv q8h, extended dosing interval Vancomycin 1gm loading dose given then 500mg  iv q12h Measure antibiotic drug levels at steady state Follow up culture results Monitor V/S and labs  Isac Sarna, BS Pharm D, BCPS Clinical Pharmacist Pager [phone] 11/19/2014,7:52 AM

## 2014-11-19 NOTE — ED Notes (Signed)
Attempted X2 to place straight cath with no urine output, bladder scan showed at least 100cc urine, per Dr Thurnell Garbe, a foley may be placed to monitor output,

## 2014-11-19 NOTE — ED Notes (Signed)
Per Dr Truman Hayward, foley is to be left in place for monitoring output,

## 2014-11-19 NOTE — NC FL2 (Deleted)
[place] LEVEL OF CARE SCREENING TOOL     IDENTIFICATION  Patient Name: Steven David Birthdate: June 10, 1955 Sex: male Admission Date (Current Location): 11/18/2014  [place] and [place] Number: [place] [id number] [place] and Address:  [facility] [address], [place]      Provider Number: [id number]  Attending Physician Name and Address:  Sinda Du, MD  Relative Name and Phone Number:       Current Level of Care: Hospital Recommended Level of Care: [place] Prior Approval Number:    Date Approved/Denied:   PASRR Number:    Discharge Plan: SNF    Current Diagnoses: Patient Active Problem List   Diagnosis Date Noted  . HCAP (healthcare-associated pneumonia) 11/19/2014  . Postoperative anemia due to acute blood loss 09/07/2014  . Seizure ([place])   . Incarcerated inguinal hernia 09/01/2014  . Respiratory failure, acute ([place]) 09/01/2014  . Metabolic acidosis 0000000  . Incarcerated hernia 09/01/2014  . Septic shock ([place]) 09/01/2014  . AKI (acute kidney injury) ([place])   . Protein-calorie malnutrition, severe ([place]) 04/13/2013  . Sepsis ([place]) 04/11/2013  . H/O stroke within last year 04/11/2013  . Hypothyroidism 04/11/2013  . Pneumonia 04/11/2013  . Thrombocytopenia ([place]) 04/11/2013  . Schizophrenia ([place]) 04/11/2013  . Malnutrition ([place]) 04/11/2013  . SIRS (systemic inflammatory response syndrome) ([facility]) 04/11/2013    Orientation ACTIVITIES/SOCIAL BLADDER RESPIRATION     (unclear)    Indwelling catheter O2 (As needed) (2L)  BEHAVIORAL SYMPTOMS/MOOD NEUROLOGICAL BOWEL NUTRITION STATUS    Convulsions/Seizures (history) Colostomy Feeding tube  PHYSICIAN VISITS COMMUNICATION OF NEEDS Height & Weight Skin    Verbally (speech is very hard to understand) 6' (182.9 cm) 120 lbs. Normal          AMBULATORY STATUS RESPIRATION    Supervision limited O2 (As needed) (2L)      Personal Care Assistance  Level of Assistance  Bathing, Feeding, Dressing Bathing Assistance: Limited assistance Feeding assistance: Limited assistance Dressing Assistance: Limited assistance      Functional Limitations Info  Sight, Hearing, Speech Sight Info: Adequate Hearing Info: Adequate Speech Info: Impaired       SPECIAL CARE FACTORS FREQUENCY                      Additional Factors Info  Code Status, Allergies, Psychotropic Code Status Info: Full code Allergies Info: No known allergies Psychotropic Info: Depakote sprinkle         Current Medications (11/19/2014): Current Facility-Administered Medications  Medication Dose Route Frequency Provider Last Rate Last Dose  . 0.9 %  sodium chloride infusion   Intravenous Continuous Francine Graven, DO 100 mL/hr at 11/19/14 0532    . antiseptic oral rinse (CPC / CETYLPYRIDINIUM CHLORIDE 0.05%) solution 7 mL  7 mL Mouth Rinse q12n4p Orvan Falconer, MD   7 mL at 11/19/14 1200  . chlorhexidine (PERIDEX) 0.12 % solution 15 mL  15 mL Mouth Rinse BID Orvan Falconer, MD   15 mL at 11/19/14 0849  . divalproex (DEPAKOTE SPRINKLE) capsule 625 mg  625 mg Oral Q12H Orvan Falconer, MD   625 mg at 11/19/14 0954  . enoxaparin (LOVENOX) injection 40 mg  40 mg Subcutaneous Q24H Orvan Falconer, MD   40 mg at 11/19/14 0532  . feeding supplement (JEVITY 1.5 CAL/FIBER) liquid 1,000 mL  1,000 mL Per Tube Continuous Sinda Du, MD 110 mL/hr at 11/19/14 0531 1,000 mL at 11/19/14 0531  . levothyroxine (SYNTHROID, LEVOTHROID) tablet 100  mcg  100 mcg Per Tube QAC breakfast Orvan Falconer, MD   100 mcg at 11/19/14 0849  . ondansetron (ZOFRAN) tablet 4 mg  4 mg Oral Q6H PRN Orvan Falconer, MD       Or  . ondansetron [facility]) injection 4 mg  4 mg Intravenous Q6H PRN Orvan Falconer, MD      . piperacillin-tazobactam (ZOSYN) IVPB 3.375 g  3.375 g Intravenous Q8H Orvan Falconer, MD   3.375 g at 11/19/14 0954  . [START ON 11/20/2014] pneumococcal 23 valent vaccine (PNU-IMMUNE) injection 0.5 mL  0.5 mL Intramuscular  Tomorrow-1000 Orvan Falconer, MD      . polyethylene glycol (MIRALAX / GLYCOLAX) packet 17 g  17 g Oral Daily PRN Sinda Du, MD      . pravastatin (PRAVACHOL) tablet 20 mg  20 mg Oral q1800 Orvan Falconer, MD       Do not use this list as official medication orders. Please verify with discharge summary.  Discharge Medications:   Medication List    ASK your doctor about these medications        cholecalciferol 1000 UNITS tablet  Commonly known as:  VITAMIN D  Take 1,000 Units by mouth daily.     divalproex 500 MG 24 hr tablet  Commonly known as:  DEPAKOTE ER  Take 500 mg by mouth 2 (two) times daily.     feeding supplement (JEVITY 1.5 CAL/FIBER) Liqd  Place 1,000 mLs into feeding tube continuous. Initiate nocturnal feedings of Jevity 1.5 @ 100 ml/hr via PEG over 14 hour period     ferrous sulfate 325 (65 FE) MG tablet  Take 325 mg by mouth daily with breakfast.     furosemide 20 MG tablet  Commonly known as:  LASIX  Take 20 mg by mouth. 09/25/14 20 mg PO two times a day for SOB, [facility] MAR     haloperidol 10 MG tablet  Commonly known as:  HALDOL  Place 1 tablet (10 mg total) into feeding tube every evening.     haloperidol decanoate 100 MG/ML injection  Commonly known as:  HALDOL DECANOATE  Inject 100 mg into the muscle every 28 (twenty-eight) days. Administered by [employer]     levETIRAcetam 250 MG tablet  Commonly known as:  KEPPRA  Take 250 mg by mouth daily.     levothyroxine 100 MCG tablet  Commonly known as:  SYNTHROID, LEVOTHROID  Place 1 tablet (100 mcg total) into feeding tube daily before breakfast.     loratadine 10 MG tablet  Commonly known as:  CLARITIN  Place 1 tablet (10 mg total) into feeding tube daily.     lovastatin 20 MG tablet  Commonly known as:  MEVACOR  Place 1 tablet (20 mg total) into feeding tube daily.     oxybutynin 5 MG tablet  Commonly known as:  DITROPAN  Place 1 tablet (5 mg total) into feeding tube 2 (two) times daily.      polyethylene glycol powder powder  Commonly known as:  GLYCOLAX/MIRALAX  Place 17 g into feeding tube daily as needed. Dissolved in 8 ounces of water/juice daily     sulfamethoxazole-trimethoprim 800-160 MG tablet  Commonly known as:  BACTRIM DS,SEPTRA DS  Take 1 tablet by mouth 2 (two) times daily.     vitamin C 500 MG tablet  Commonly known as:  ASCORBIC ACID  Take 500 mg by mouth 2 (two) times daily.        Relevant Imaging Results:  Relevant  Lab Results:  Recent Labs    Additional Information    Salome Arnt, LCSW

## 2014-11-19 NOTE — Progress Notes (Signed)
This is an assumption of care note. He was admitted early this morning with aspiration pneumonia. He had speech evaluation in September which recommended that he be nothing by mouth. I think he is going to need to remain with that recommendation especially considering that he aspirated. I don't know for sure if he was eating at the nursing home but he has been receiving tube feedings. I think he may need guardianship if this has not already been established. I will plan to continue with nothing by mouth status continue with IV antibiotics.

## 2014-11-19 NOTE — Progress Notes (Signed)
Late entry-At 2040 tube feeding started at 110 ml/hr.

## 2014-11-19 NOTE — Plan of Care (Signed)
Problem: Phase II Progression Outcomes Goal: Tolerating diet Outcome: Completed/Met Date Met:  11/19/14 Patient is on tube feeding

## 2014-11-19 NOTE — Care Management Note (Signed)
Case Management Note  Patient Details  Name: Steven David MRN: [id number] Date of Birth: [birth year]  Subjective/Objective:                  Pt admitted with pnuemonia. Pt is is from [facility].   Action/Plan: Anticipate pt will return to SNF at [facility] is aware and will arrange for return to facility. No CM needs anticipated.   Expected Discharge Date:    11/22/2014               Expected Discharge Plan:  Skilled Nursing Facility  In-House Referral:  Clinical Social Work  Discharge planning Services  CM Consult  Post Acute Care Choice:  NA Choice offered to:  NA  DME Arranged:    DME Agency:     HH Arranged:    [place] Agency:     Status of Service:  Completed, signed off  Medicare Important Message Given:    Date Medicare IM Given:    Medicare IM give by:    Date Additional Medicare IM Given:    Additional Medicare Important Message give by:     If discussed at [place] of Stay Meetings, dates discussed:    Additional Comments:  Sherald Barge, RN 11/19/2014, 9:50 AM

## 2014-11-19 NOTE — ED Notes (Signed)
Pt sent to er from [facility] for further evaluation of weakness, tremors to bilateral arms, pt difficult to understand, does have hx of same, reports "i don't feel good"

## 2014-11-19 NOTE — ED Notes (Signed)
Pt updated on plan of care,  

## 2014-11-19 NOTE — ED Notes (Signed)
Report given to floor,  

## 2014-11-19 NOTE — Clinical Social Work Note (Signed)
Clinical Social Work Assessment  Patient Details  Name: BEARL TALARICO MRN: [id number] Date of Birth: 1955-12-02  Date of referral:  11/19/14               Reason for consult:  Discharge Planning                Permission sought to share information with:  Other Permission granted to share information::  Yes, Verbal Permission Granted  Name::     De Blanch  Agency::     Relationship::  previous facility Chartered loss adjuster Information:     Housing/Transportation Living arrangements for the past 2 months:  [place] of Information:  Patient, Facility, Other (Comment Required) (previous Information systems manager- still very involved and listed as contact) Patient Interpreter Needed:  None Criminal Activity/Legal Involvement Pertinent to Current Situation/Hospitalization:  No - Comment as needed Significant Relationships:  Other(Comment) De Blanch) Lives with:  Facility Resident Do you feel safe going back to the place where you live?  Yes Need for family participation in patient care:   (no family involved)  Care giving concerns:  Pt is resident at [facility].    Social Worker assessment / plan:  CSW met with pt at bedside. Pt alert, but could not assess mental status due to very garbled speech. He did indicate that it was okay to contact De Blanch, administrator at family care home pt was a resident at for many years. Pt went to [facility] in September due to new PEG and colostomy. She has continued to stay very involved and remains pt contact as family is not involved. Pt admitted with aspiration pneumonia. Mary requests return to [facility] as they are unable to handle PEG at family care home. Mary plans to visit pt today in hospital. Per Debbie at [facility], pt is skilled level of care and okay to return. He ambulates with a rolling walker and requires limited assist with ADLs. CSW noted that pasarr expired in September. New screening submitted. CSW discussed with Environmental health practitioner and [facility]. Facility is willing to accept pt when medically ready and await pasarr number.   Employment status:  Disabled (Comment on whether or not currently receiving Disability) Insurance information:  Medicare PT Recommendations:  Not assessed at this time Information / Referral to community resources:  Other (Comment Required) (return to [facility])  Patient/Family's Response to care:  Plan return to [facility] when medically stable.  Patient/Family's Understanding of and Emotional Response to Diagnosis, Current Treatment, and Prognosis:  [facility] Kidney is well aware of pt's medical history due to having him in facility for many years. Unable to assess pt.   Emotional Assessment Appearance:  Appears stated age Attitude/Demeanor/Rapport:  Unable to Assess Affect (typically observed):  Unable to Assess Orientation:   (could not asses as speech was very garbled) Alcohol / Substance use:  Not Applicable Psych involvement (Current and /or in the community):  No (Comment)  Discharge Needs  Concerns to be addressed:  Discharge Planning Concerns Readmission within the last 30 days:  No Current discharge risk:  None Barriers to Discharge:  Continued Medical Work up   Salome Arnt, [place] 11/19/2014, 10:41 AM [phone]

## 2014-11-19 NOTE — ED Provider Notes (Signed)
CSN: [id number]     Arrival date & time 11/18/14  2342 History   First MD Initiated Contact with Patient 11/18/14 2350     Chief Complaint  Patient presents with  . Tremors  . Weakness      Patient is a 59 y.o. male presenting with weakness. The history is provided by the EMS personnel, the nursing home and the patient. The history is limited by the condition of the patient (Hx dementia).  Weakness  Pt was seen at 2340. Per EMS and NH report: NH sent pt to the ED for generalized "weakness" that began yesterday. Pt's lab work today showed an elevated WBC count, so he was sent to the ED for further evaluation. Pt has hx of dementia and only states "I don't feel good."    Past Medical History  Diagnosis Date  . Schizophrenia ([place])   . Hernia   . Thyroid disease   . High cholesterol   . HOH (hard of hearing)   . Dysphagia   . Cognitive communication deficit   . Seizures ([place])   . Drug-induced Parkinsonism ([place])   . Malnutrition ([place])   . TIA (transient ischemic attack)    Past Surgical History  Procedure Laterality Date  . Leg surgery    . Testicle surgery    . Laparotomy N/A 09/01/2014    Procedure: EXPLORATORY LAPAROTOMY, SIGMOID COLECTOMY, SIGMOID COLOSTOMY, PRIMARY REPAIR OF STRANGULATED INGUINAL HERNIA.;  Surgeon: Rolm Bookbinder, MD;  Location: [facility];  Service: General;  Laterality: N/A;  . Esophagogastroduodenoscopy (egd) with propofol N/A 09/08/2014    Procedure: ESOPHAGOGASTRODUODENOSCOPY (EGD) WITH PROPOFOL;  Surgeon: Georganna Skeans, MD;  Location: [facility];  Service: Endoscopy;  Laterality: N/A;  . Peg placement N/A 09/08/2014    Procedure: PERCUTANEOUS ENDOSCOPIC GASTROSTOMY (PEG) PLACEMENT;  Surgeon: Georganna Skeans, MD;  Location: [facility];  Service: Endoscopy;  Laterality: N/A;   Family History  Problem Relation Age of Onset  . Family history unknown: Yes   Social History  Substance Use Topics  . Smoking status: Former Research scientist (life sciences)  . Smokeless tobacco: None   . Alcohol Use: No    Review of Systems  Unable to perform ROS: Dementia  Neurological: Positive for weakness.      Allergies  Review of patient's allergies indicates no known allergies.  Home Medications   Prior to Admission medications   Medication Sig Start Date End Date Taking? Authorizing Provider  cholecalciferol (VITAMIN D) 1000 UNITS tablet Take 1,000 Units by mouth daily.   Yes Historical Provider, MD  divalproex (DEPAKOTE SPRINKLE) 125 MG capsule Take 4 capsules (500 mg total) by mouth every 12 (twelve) hours. 09/10/14  Yes Nat Christen, PA-C  ferrous sulfate 325 (65 FE) MG tablet Take 325 mg by mouth daily with breakfast.   Yes Historical Provider, MD  furosemide (LASIX) 20 MG tablet Take 20 mg by mouth. 09/25/14 20 mg PO two times a day for SOB, [facility] MAR   Yes Historical Provider, MD  haloperidol (HALDOL) 10 MG tablet Place 1 tablet (10 mg total) into feeding tube every evening. 09/10/14  Yes Nat Christen, PA-C  haloperidol decanoate (HALDOL DECANOATE) 100 MG/ML injection Inject 100 mg into the muscle every 28 (twenty-eight) days. Administered by [employer]   Yes Historical Provider, MD  levETIRAcetam (KEPPRA) 250 MG tablet Take 250 mg by mouth daily.   Yes Historical Provider, MD  levothyroxine (SYNTHROID, LEVOTHROID) 100 MCG tablet Place 1 tablet (100 mcg total) into feeding tube daily before breakfast.  09/10/14  Yes Nat Christen, PA-C  loratadine (CLARITIN) 10 MG tablet Place 1 tablet (10 mg total) into feeding tube daily. 09/10/14  Yes Nat Christen, PA-C  lovastatin (MEVACOR) 20 MG tablet Place 1 tablet (20 mg total) into feeding tube daily. 09/10/14  Yes Nat Christen, PA-C  Nutritional Supplements (FEEDING SUPPLEMENT, JEVITY 1.5 CAL/FIBER,) LIQD Place 1,000 mLs into feeding tube continuous. Initiate nocturnal feedings of Jevity 1.5 @ 100 ml/hr via PEG over 14 hour period 09/10/14  Yes Nat Christen, PA-C  oxybutynin (DITROPAN) 5 MG tablet Place 1 tablet (5 mg total) into  feeding tube 2 (two) times daily. 09/10/14  Yes Nat Christen, PA-C  polyethylene glycol powder (GLYCOLAX/MIRALAX) powder Place 17 g into feeding tube daily as needed. Dissolved in 8 ounces of water/juice daily 09/10/14  Yes Nat Christen, PA-C  sulfamethoxazole-trimethoprim (BACTRIM DS,SEPTRA DS) 800-160 MG tablet Take 1 tablet by mouth 2 (two) times daily.   Yes Historical Provider, MD  HYDROcodone-acetaminophen (NORCO/VICODIN) 5-325 MG per tablet Place 1-2 tablets into feeding tube every 6 (six) hours as needed for severe pain. Patient not taking: Reported on 09/25/2014 09/10/14   Nat Christen, PA-C   BP 94/62 mmHg  Pulse 95  Temp(Src) 98.5 F (36.9 C) (Oral)  Resp 20  SpO2 92%   Patient Vitals for the past 24 hrs:  BP Temp Temp src Pulse Resp SpO2  11/19/14 0400 94/62 mmHg - - 95 - 92 %  11/19/14 0347 - 98.5 F (36.9 C) Oral - - -  11/19/14 0330 112/67 mmHg - - 83 - 94 %  11/19/14 0320 120/84 mmHg - - 86 20 95 %  11/19/14 0222 - 98.6 F (37 C) Oral - - -  11/19/14 0145 159/83 mmHg - - 91 20 96 %  11/18/14 2350 100/89 mmHg 100 F (37.8 C) Rectal 98 22 91 %    Physical Exam 2345: Physical examination:  Nursing notes reviewed; Vital signs and O2 SAT reviewed;  Constitutional: Well developed, Well nourished, In no acute distress; Head:  Normocephalic, atraumatic; Eyes: EOMI, PERRL, No scleral icterus; ENMT: Mouth and pharynx normal, Mucous membranes dry; Neck: Supple, Full range of motion, No lymphadenopathy; Cardiovascular: Regular rate and rhythm, No gallop; Respiratory: Breath sounds coarse & equal bilaterally, No wheezes. Normal respiratory effort/excursion; Chest: Nontender, Movement normal; Abdomen: Soft, Nontender, Nondistended, Normal bowel sounds. +PEG, +colostomy.; Genitourinary: No CVA tenderness; Extremities: Pulses normal, No tenderness, No edema, No calf edema or asymmetry.; Neuro: Awake, alert, confused re: time, place, events. Eyes open. Speech occasionally garbled. No facial  droop. Moves extremities on stretcher.; Skin: Color normal, Warm, Dry.    ED Course  Procedures (including critical care time) Labs Review   Imaging Review  I have personally reviewed and evaluated these images and lab results as part of my medical decision-making.   EKG Interpretation   Date/Time:  Wednesday November 18 2014 23:59:22 EST Ventricular Rate:  90 PR Interval:  136 QRS Duration: 88 QT Interval:  368 QTC Calculation: 450 R Axis:   50 Text Interpretation:  Normal sinus rhythm Normal ECG When compared with  ECG of 09/04/2014 Left bundle branch block is no longer Present Confirmed  by [facility]  MD, Nunzio Cory [phone]) on 11/19/2014 12:58:16 AM      MDM  MDM Reviewed: previous chart, nursing note and vitals Reviewed previous: labs and ECG Interpretation: labs, ECG, x-ray and CT scan      Results for orders placed or performed during the  hospital encounter of 11/18/14  Urinalysis, Routine w reflex microscopic  Result Value Ref Range   Color, Urine YELLOW YELLOW   APPearance CLEAR CLEAR   Specific Gravity, Urine 1.015 1.005 - 1.030   pH 8.5 (H) 5.0 - 8.0   Glucose, UA NEGATIVE NEGATIVE mg/dL   Hgb urine dipstick LARGE (A) NEGATIVE   Bilirubin Urine NEGATIVE NEGATIVE   Ketones, ur NEGATIVE NEGATIVE mg/dL   Protein, ur NEGATIVE NEGATIVE mg/dL   Urobilinogen, UA 1.0 0.0 - 1.0 mg/dL   Nitrite NEGATIVE NEGATIVE   Leukocytes, UA NEGATIVE NEGATIVE  Comprehensive metabolic panel  Result Value Ref Range   Sodium 141 135 - 145 mmol/L   Potassium 4.0 3.5 - 5.1 mmol/L   Chloride 101 101 - 111 mmol/L   CO2 31 22 - 32 mmol/L   Glucose, Bld 92 65 - 99 mg/dL   BUN 24 (H) 6 - 20 mg/dL   Creatinine, Ser 1.11 0.61 - 1.24 mg/dL   Calcium 9.6 8.9 - 10.3 mg/dL   Total Protein 8.5 (H) 6.5 - 8.1 g/dL   Albumin 3.6 3.5 - 5.0 g/dL   AST 16 15 - 41 U/L   ALT 9 (L) 17 - 63 U/L   Alkaline Phosphatase 65 38 - 126 U/L   Total Bilirubin 0.4 0.3 - 1.2 mg/dL   GFR calc non Af Amer  >60 >60 mL/min   GFR calc Af Amer >60 >60 mL/min   Anion gap 9 5 - 15  Troponin I  Result Value Ref Range   Troponin I <0.03 <0.031 ng/mL  Lactic acid, plasma  Result Value Ref Range   Lactic Acid, Venous 0.6 0.5 - 2.0 mmol/L  Lactic acid, plasma  Result Value Ref Range   Lactic Acid, Venous 0.5 0.5 - 2.0 mmol/L  CBC with Differential  Result Value Ref Range   WBC 12.9 (H) 4.0 - 10.5 K/uL   RBC 3.93 (L) 4.22 - 5.81 MIL/uL   Hemoglobin 10.4 (L) 13.0 - 17.0 g/dL   HCT 33.8 (L) 39.0 - 52.0 %   MCV 86.0 78.0 - 100.0 fL   MCH 26.5 26.0 - 34.0 pg   MCHC 30.8 30.0 - 36.0 g/dL   RDW 15.9 (H) 11.5 - 15.5 %   Platelets 109 (L) 150 - 400 K/uL   Neutrophils Relative % 76 %   Neutro Abs 9.8 (H) 1.7 - 7.7 K/uL   Lymphocytes Relative 13 %   Lymphs Abs 1.6 0.7 - 4.0 K/uL   Monocytes Relative 9 %   Monocytes Absolute 1.2 (H) 0.1 - 1.0 K/uL   Eosinophils Relative 2 %   Eosinophils Absolute 0.2 0.0 - 0.7 K/uL   Basophils Relative 0 %   Basophils Absolute 0.0 0.0 - 0.1 K/uL   RBC Morphology ROULEAUX    Smear Review PLATELETS APPEAR ADEQUATE   Valproic acid level  Result Value Ref Range   Valproic Acid Lvl 14 (L) 50.0 - 100.0 ug/mL  Urine microscopic-add on  Result Value Ref Range   Squamous Epithelial / LPF FEW (A) RARE   WBC, UA 0-2 <3 WBC/hpf   RBC / HPF TOO NUMEROUS TO COUNT <3 RBC/hpf   Bacteria, UA FEW (A) RARE   Dg Chest 2 View 11/19/2014  CLINICAL DATA:  Weakness. EXAM: CHEST  2 VIEW COMPARISON:  09/03/2014 FINDINGS: Asymmetric airspace opacity in the left lower lobe with reticular nodular appearance. Normal heart size and mediastinal contours. No edema, effusion, or pneumothorax. IMPRESSION: Left lower lobe airspace disease  which could reflect pneumonia or aspiration. Electronically Signed   By: Monte Fantasia M.D.   On: 11/19/2014 00:46   Ct Head Wo Contrast 11/19/2014  CLINICAL DATA:  Initial evaluation for generalized weakness, tremors. EXAM: CT HEAD WITHOUT CONTRAST  TECHNIQUE: Contiguous axial images were obtained from the base of the skull through the vertex without intravenous contrast. COMPARISON:  Prior CT from 09/02/2014. FINDINGS: Generalized cerebral atrophy is stable from previous. Mild chronic small vessel ischemic disease. Remote lacunar infarct within the left caudate head. No acute large vessel territory infarct. No intracranial hemorrhage. No mass lesion, midline shift or mass effect. No hydrocephalus. No extra-axial fluid collection para Scalp soft tissues within normal limits. No acute abnormality about the orbits. Paranasal sinuses and mastoid air cells are clear. Calvarium is intact. IMPRESSION: 1. No acute intracranial process. 2. Stable atrophy with chronic small vessel ischemic disease. 3. 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 Electronically Signed   By: Jeannine Boga M.D.   On: 11/19/2014 00:59    0305:  IV abx started for HCAP. Pt hypoxic on arrival: O2 Sats increased to 95-96% on O2 2L N/C.  T/C to [facility]. [place] Comment, case discussed, including:  HPI, pertinent PM/SHx, VS/PE, dx testing, ED course and treatment:  Agreeable to admit, requests he will come to the ED for evaluation.   Francine Graven, DO 11/20/14 Lynnell Catalan

## 2014-11-19 NOTE — ED Notes (Signed)
[facility] notified of pt's admission,

## 2014-11-19 NOTE — ED Notes (Signed)
Dr Lee at bedside,  

## 2014-11-19 NOTE — H&P (Signed)
[facility] History and Physical  Steven David [id number] DOB: Jan 16, 1955    PCP:   Alonza Bogus, MD   Chief Complaint: brought in for tremor, admitted for possible HCAP.   HPI: Steven David is an 59 y.o. male with hx of schizophrenia, seizure, prior CVA, hx of incarcerated hernia, s/p surgery with respiratory failure, PEG tube, hx of drug induced parkinsonism, TIA malnutrition, brought to the ER as he was having shakes.  This has been diagnosed with drug induced parkinsonism, but Xray in the ER showed possible PNA vs aspiration.  He was subsequently started on Van/Zosyn, and hospitalist was asked to admit him for HCAP.  He is not able to give any meaningful history.   He is a full code.   Rewiew of Systems: Unable.   Past Medical History  Diagnosis Date  . Schizophrenia ([place])   . Hernia   . Thyroid disease   . High cholesterol   . HOH (hard of hearing)   . Dysphagia   . Cognitive communication deficit   . Seizures ([place])   . Drug-induced Parkinsonism ([place])   . Malnutrition ([place])   . TIA (transient ischemic attack)     Past Surgical History  Procedure Laterality Date  . Leg surgery    . Testicle surgery    . Laparotomy N/A 09/01/2014    Procedure: EXPLORATORY LAPAROTOMY, SIGMOID COLECTOMY, SIGMOID COLOSTOMY, PRIMARY REPAIR OF STRANGULATED INGUINAL HERNIA.;  Surgeon: Rolm Bookbinder, MD;  Location: [facility];  Service: General;  Laterality: N/A;  . Esophagogastroduodenoscopy (egd) with propofol N/A 09/08/2014    Procedure: ESOPHAGOGASTRODUODENOSCOPY (EGD) WITH PROPOFOL;  Surgeon: Georganna Skeans, MD;  Location: [facility];  Service: Endoscopy;  Laterality: N/A;  . Peg placement N/A 09/08/2014    Procedure: PERCUTANEOUS ENDOSCOPIC GASTROSTOMY (PEG) PLACEMENT;  Surgeon: Georganna Skeans, MD;  Location: [facility];  Service: Endoscopy;  Laterality: N/A;    Medications:  HOME MEDS: Prior to Admission medications   Medication Sig Start Date End Date Taking?  Authorizing Provider  cholecalciferol (VITAMIN D) 1000 UNITS tablet Take 1,000 Units by mouth daily.   Yes Historical Provider, MD  divalproex (DEPAKOTE SPRINKLE) 125 MG capsule Take 4 capsules (500 mg total) by mouth every 12 (twelve) hours. 09/10/14  Yes Nat Christen, PA-C  ferrous sulfate 325 (65 FE) MG tablet Take 325 mg by mouth daily with breakfast.   Yes Historical Provider, MD  furosemide (LASIX) 20 MG tablet Take 20 mg by mouth. 09/25/14 20 mg PO two times a day for SOB, [facility]   Yes Historical Provider, MD  haloperidol (HALDOL) 10 MG tablet Place 1 tablet (10 mg total) into feeding tube every evening. 09/10/14  Yes Nat Christen, PA-C  haloperidol decanoate (HALDOL DECANOATE) 100 MG/ML injection Inject 100 mg into the muscle every 28 (twenty-eight) days. Administered by [employer]   Yes Historical Provider, MD  levETIRAcetam (KEPPRA) 250 MG tablet Take 250 mg by mouth daily.   Yes Historical Provider, MD  levothyroxine (SYNTHROID, LEVOTHROID) 100 MCG tablet Place 1 tablet (100 mcg total) into feeding tube daily before breakfast. 09/10/14  Yes Nat Christen, PA-C  loratadine (CLARITIN) 10 MG tablet Place 1 tablet (10 mg total) into feeding tube daily. 09/10/14  Yes Nat Christen, PA-C  lovastatin (MEVACOR) 20 MG tablet Place 1 tablet (20 mg total) into feeding tube daily. 09/10/14  Yes Nat Christen, PA-C  Nutritional Supplements (FEEDING SUPPLEMENT, JEVITY 1.5 CAL/FIBER,) LIQD Place 1,000 mLs into feeding tube continuous. Initiate  nocturnal feedings of Jevity 1.5 @ 100 ml/hr via PEG over 14 hour period 09/10/14  Yes Nat Christen, PA-C  oxybutynin (DITROPAN) 5 MG tablet Place 1 tablet (5 mg total) into feeding tube 2 (two) times daily. 09/10/14  Yes Nat Christen, PA-C  polyethylene glycol powder (GLYCOLAX/MIRALAX) powder Place 17 g into feeding tube daily as needed. Dissolved in 8 ounces of water/juice daily 09/10/14  Yes Nat Christen, PA-C  sulfamethoxazole-trimethoprim (BACTRIM DS,SEPTRA DS) 800-160  MG tablet Take 1 tablet by mouth 2 (two) times daily.   Yes Historical Provider, MD  HYDROcodone-acetaminophen (NORCO/VICODIN) 5-325 MG per tablet Place 1-2 tablets into feeding tube every 6 (six) hours as needed for severe pain. Patient not taking: Reported on 09/25/2014 09/10/14   Nat Christen, PA-C     Allergies:  No Known Allergies  Social History:   reports that he has quit smoking. He does not have any smokeless tobacco history on file. He reports that he does not drink alcohol or use illicit drugs.  Family History: Family History  Problem Relation Age of Onset  . Family history unknown: Yes     Physical Exam: Filed Vitals:   11/19/14 0222 11/19/14 0320 11/19/14 0330 11/19/14 0347  BP:  120/84 112/67   Pulse:  86 83   Temp: 98.6 F (37 C)   98.5 F (36.9 C)  TempSrc: Oral   Oral  Resp:  20    SpO2:  95% 94%    Blood pressure 112/67, pulse 83, temperature 98.5 F (36.9 C), temperature source Oral, resp. rate 20, SpO2 94 %.  GEN:  Pleasant  patient lying in the stretcher in no acute distress; cooperative with exam. PSYCH:  alert and oriented x4; does not appear anxious or depressed; affect is appropriate. HEENT: Mucous membranes pink and anicteric; PERRLA; EOM intact; no cervical lymphadenopathy nor thyromegaly or carotid bruit; no JVD; There were no stridor. Neck is very supple. Breasts:: Not examined CHEST WALL: No tenderness CHEST: Normal respiration, clear to auscultation bilaterally.  HEART: Regular rate and rhythm.  There are no murmur, rub, or gallops.   BACK: No kyphosis or scoliosis; no CVA tenderness ABDOMEN: soft and non-tender; no masses, no organomegaly, normal abdominal bowel sounds; no pannus; no intertriginous candida. There is no rebound and no distention. Rectal Exam: Not done EXTREMITIES: No bone or joint deformity; age-appropriate arthropathy of the hands and knees; no edema; no ulcerations.  There is no calf tenderness. Genitalia: not  examined PULSES: 2+ and symmetric SKIN: Normal hydration no rash or ulceration CNS: Cranial nerves 2-12 grossly intact no focal lateralizing neurologic deficit.  Speech is fluent; uvula elevated with phonation, facial symmetry and tongue midline. DTR are normal bilaterally, cerebella exam is intact, barbinski is negative and strengths are equaled bilaterally.  No sensory loss.   Labs on Admission:  Basic Metabolic Panel:  Recent Labs Lab 11/19/14 0051  NA 141  K 4.0  CL 101  CO2 31  GLUCOSE 92  BUN 24*  CREATININE 1.11  CALCIUM 9.6   Liver Function Tests:  Recent Labs Lab 11/19/14 0051  AST 16  ALT 9*  ALKPHOS 65  BILITOT 0.4  PROT 8.5*  ALBUMIN 3.6   No results for input(s): LIPASE, AMYLASE in the last 168 hours. No results for input(s): AMMONIA in the last 168 hours. CBC:  Recent Labs Lab 11/19/14 0051  WBC 12.9*  NEUTROABS 9.8*  HGB 10.4*  HCT 33.8*  MCV 86.0  PLT 109*  Cardiac Enzymes:  Recent Labs Lab 11/19/14 0051  TROPONINI <0.03   Radiological Exams on Admission: Dg Chest 2 View  11/19/2014  CLINICAL DATA:  Weakness. EXAM: CHEST  2 VIEW COMPARISON:  09/03/2014 FINDINGS: Asymmetric airspace opacity in the left lower lobe with reticular nodular appearance. Normal heart size and mediastinal contours. No edema, effusion, or pneumothorax. IMPRESSION: Left lower lobe airspace disease which could reflect pneumonia or aspiration. Electronically Signed   By: Monte Fantasia M.D.   On: 11/19/2014 00:46   Ct Head Wo Contrast  11/19/2014  CLINICAL DATA:  Initial evaluation for generalized weakness, tremors. EXAM: CT HEAD WITHOUT CONTRAST TECHNIQUE: Contiguous axial images were obtained from the base of the skull through the vertex without intravenous contrast. COMPARISON:  Prior CT from 09/02/2014. FINDINGS: Generalized cerebral atrophy is stable from previous. Mild chronic small vessel ischemic disease. Remote lacunar infarct within the left caudate head. No  acute large vessel territory infarct. No intracranial hemorrhage. No mass lesion, midline shift or mass effect. No hydrocephalus. No extra-axial fluid collection para Scalp soft tissues within normal limits. No acute abnormality about the orbits. Paranasal sinuses and mastoid air cells are clear. Calvarium is intact. IMPRESSION: 1. No acute intracranial process. 2. Stable atrophy with chronic small vessel ischemic disease. 3. 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 Electronically Signed   By: Jeannine Boga M.D.   On: 11/19/2014 00:59   Assessment/Plan Present on Admission:  . Pneumonia . Protein-calorie malnutrition, severe ([place]) . Schizophrenia ([place]) . Hypothyroidism . HCAP (healthcare-associated pneumonia)  PLAN:  Will admit him for possible HCAP, given CXR with infiltrate, mild leukocytosis of 12K, and low grade temp of 100.  Will continue with his IV Van/Zosyn.  I have continue with his home meds.  His Valproic acid is a little low, so I will give an extra bolus, and increase his Depakote to 1250 from 1000mg  daily. For his hypothyroidism, will check TSH.  He is otherwise stable, and will be admitted to Dr Miguel Rota service.  Thank you and Good Day.   Other plans as per orders.  Code Status: FULL Haskel Khan, MD. [facility] Pager [phone] 7pm to 7am.  11/19/2014, 3:53 AM

## 2014-11-19 NOTE — Progress Notes (Signed)
ANTIBIOTIC CONSULT NOTE-Preliminary  Pharmacy Consult for vancomycin Indication: pneumonia  No Known Allergies  Patient Measurements:     Vital Signs: Temp: 98.6 F (37 C) (11/10 0222) Temp Source: Oral (11/10 0222) BP: 159/83 mmHg (11/10 0145) Pulse Rate: 91 (11/10 0145)  Labs:  Recent Labs  11/19/14 0051  WBC 12.9*  HGB 10.4*  PLT 109*  CREATININE 1.11    CrCl cannot be calculated (Unknown ideal weight.).  No results for input(s): VANCOTROUGH, VANCOPEAK, VANCORANDOM, GENTTROUGH, GENTPEAK, GENTRANDOM, TOBRATROUGH, TOBRAPEAK, TOBRARND, AMIKACINPEAK, AMIKACINTROU, AMIKACIN in the last 72 hours.   Microbiology: No results found for this or any previous visit (from the past 720 hour(s)).  Medical History: Past Medical History  Diagnosis Date  . Schizophrenia ([place])   . Hernia   . Thyroid disease   . High cholesterol   . HOH (hard of hearing)   . Dysphagia   . Cognitive communication deficit   . Seizures ([place])   . Drug-induced Parkinsonism ([place])   . Malnutrition ([place])   . TIA (transient ischemic attack)     Medications:  Scheduled:    Assessment: 59 yo male being treated for PNA  Goal of Therapy:  Vancomycin trough level 15-20 mcg/ml  Plan:  Preliminary review of pertinent patient information completed.  Protocol will be initiated with a one-time dose(s) of vancomycin 1 gram IV x 1.  Forestine Na clinical pharmacist will complete review during morning rounds to assess patient and finalize treatment regimen.  Aarian Griffie Scarlett, RPH 11/19/2014,2:32 AM

## 2014-11-19 NOTE — Progress Notes (Signed)
Initial Nutrition Assessment  DOCUMENTATION CODES:   Non-severe (moderate) malnutrition in context of chronic illness, Underweight  INTERVENTION:  Recommend Osmolite 1.5 @ 110 ml/hr x 12 hours via PEG   Tube feeding regimen provides 1980  kcal (100% of needs), 87 grams of protein, and 990 ml of H2O.     NUTRITION DIAGNOSIS:   Inadequate oral intake related to inability to eat, dysphagia as evidenced by  .  GOAL:   Patient will meet greater than or equal to 90% of their needs   MONITOR:  Tube feeding tolerance and weight changes    REASON FOR ASSESSMENT: Admitted with enteral feeding  NPO/Clear Liquid Diet    ASSESSMENT:  Mr Ehrsam is from [facility] and has hx of malnutrition, schizophrenia and CVA. He is  s/p sigmoid colectomy on 09/01/14 and had PEG placed 09/08/14. He presents with possible PNA question aspiration per MD. He is afebrile today. Drug induced Parkinsonism.  Pt currently has protective mitts on and his lips are quivering. He is unable to provide hx. Based on chart review he appears to be underweight at baseline going back to 2014. He has moderate orbital and upper arm fat depletion and mild temporal wasting and moderate clavicle muscle loss. His enteral feeding has been Jevity 1.5 @ 110 ml/hr x 12 hr daily which is providing: 1980 kcal, 84 gr protein and 988 ml water.  Recommend change formula to Osmolite 1.5 @ 100 ml/hr x 12 hr which will meet 100% of est needs and would be a suitable substitution since pt is on a bowel regimen.  Pt meets criteria for moderate  MALNUTRITION in the context of chronic illness as evidenced by mild depletion of body fat and muscle mass.   Diet Order:  Diet NPO time specified  Skin:    dry, intact- has catherter  Last BM:   11/10- pt has Ostomy; loose stool, brown   Height:   Ht Readings from Last 1 Encounters:  11/19/14 6' (1.829 m)    Weight:   Wt Readings from Last 1 Encounters:  11/19/14 120 lb 13 oz (54.8 kg)    Ideal  Body Weight:  80.9 kg  BMI:  Body mass index is 16.38 kg/(m^2).  Estimated Nutritional Needs:   Kcal:  1925  Protein:  82-93 gr  Fluid:  1.9 liters daily  EDUCATION NEEDS: not appropriate due to pt cognitive impairment    Colman Cater MS,RD,CSG,LDN Office: [phone] Pager: [phone]

## 2014-11-20 ENCOUNTER — Inpatient Hospital Stay (HOSPITAL_COMMUNITY): Payer: Medicare Other

## 2014-11-20 ENCOUNTER — Encounter (HOSPITAL_COMMUNITY): Payer: Self-pay

## 2014-11-20 DIAGNOSIS — A419 Sepsis, unspecified organism: Secondary | ICD-10-CM | POA: Diagnosis not present

## 2014-11-20 DIAGNOSIS — E440 Moderate protein-calorie malnutrition: Secondary | ICD-10-CM | POA: Insufficient documentation

## 2014-11-20 LAB — URINE CULTURE: Culture: NO GROWTH

## 2014-11-20 MED ORDER — SODIUM CHLORIDE 0.9 % IV BOLUS (SEPSIS): 500.0000 mL | Freq: Once | INTRAVENOUS | Status: DC

## 2014-11-20 MED ORDER — SODIUM CHLORIDE 0.9 % IJ SOLN
10.0000 mL | Freq: Two times a day (BID) | INTRAMUSCULAR | Status: DC
  Administered 2014-11-20: 10 mL

## 2014-11-20 MED ORDER — SODIUM CHLORIDE 0.9 % IJ SOLN
10.0000 mL | INTRAMUSCULAR | Status: DC | PRN
  Administered 2014-11-21: 10 mL
  Filled 2014-11-20: qty 40

## 2014-11-20 NOTE — Clinical Social Work Note (Signed)
CSW updated [facility] on pt. Anticipate d/c this weekend. Facility aware of PICC line for 10 days antibiotics.   Steven David, [place]

## 2014-11-20 NOTE — NC FL2 (Signed)
[place] LEVEL OF CARE SCREENING TOOL     IDENTIFICATION  Patient Name: Steven David Birthdate: 1955-07-27 Sex: male Admission Date (Current Location): 11/19/2014  [place] and [place] Number: [place] [id number] [place] and Address:  [facility] [address] [place]      Provider Number: [id number]  Attending Physician Name and Address:  Sinda Du, MD  Relative Name and Phone Number:       Current Level of Care: Hospital Recommended Level of Care: [place] Prior Approval Number:    Date Approved/Denied:   PASRR Number:    Discharge Plan: SNF    Current Diagnoses: Patient Active Problem List   Diagnosis Date Noted  . Malnutrition of moderate degree 11/20/2014  . HCAP (healthcare-associated pneumonia) 11/19/2014  . Postoperative anemia due to acute blood loss 09/07/2014  . Seizure ([place])   . Incarcerated inguinal hernia 09/01/2014  . Respiratory failure, acute ([place]) 09/01/2014  . Metabolic acidosis 0000000  . Incarcerated hernia 09/01/2014  . Septic shock ([place]) 09/01/2014  . AKI (acute kidney injury) ([place])   . Protein-calorie malnutrition, severe ([place]) 04/13/2013  . Sepsis ([place]) 04/11/2013  . H/O stroke within last year 04/11/2013  . Hypothyroidism 04/11/2013  . Pneumonia 04/11/2013  . Thrombocytopenia ([place]) 04/11/2013  . Schizophrenia ([place]) 04/11/2013  . Malnutrition ([place]) 04/11/2013  . SIRS (systemic inflammatory response syndrome) ([facility]) 04/11/2013    Orientation ACTIVITIES/SOCIAL BLADDER RESPIRATION     (unclear)    Indwelling catheter O2 (As needed) (2L)  BEHAVIORAL SYMPTOMS/MOOD NEUROLOGICAL BOWEL NUTRITION STATUS    Convulsions/Seizures (history) Colostomy Feeding tube  PHYSICIAN VISITS COMMUNICATION OF NEEDS Height & Weight Skin    Verbally (speech is very hard to understand) 6' (182.9 cm) 120 lbs. Normal          AMBULATORY STATUS RESPIRATION    Supervision limited O2  (As needed) (2L)      Personal Care Assistance Level of Assistance  Bathing, Feeding, Dressing Bathing Assistance: Limited assistance Feeding assistance: Limited assistance Dressing Assistance: Limited assistance      Functional Limitations Info  Sight, Hearing, Speech Sight Info: Adequate Hearing Info: Adequate Speech Info: Impaired       SPECIAL CARE FACTORS FREQUENCY                      Additional Factors Info  Code Status, Allergies, Psychotropic Code Status Info: Full code Allergies Info: No known allergies Psychotropic Info: Depakote sprinkle         Current Medications (11/20/2014): Current Facility-Administered Medications  Medication Dose Route Frequency Provider Last Rate Last Dose  . 0.9 %  sodium chloride infusion   Intravenous Continuous Francine Graven, DO 100 mL/hr at 11/20/14 0309    . antiseptic oral rinse (CPC / CETYLPYRIDINIUM CHLORIDE 0.05%) solution 7 mL  7 mL Mouth Rinse q12n4p Orvan Falconer, MD   7 mL at 11/19/14 1600  . chlorhexidine (PERIDEX) 0.12 % solution 15 mL  15 mL Mouth Rinse BID Orvan Falconer, MD   15 mL at 11/19/14 2037  . divalproex (DEPAKOTE SPRINKLE) capsule 625 mg  625 mg Oral Q12H Orvan Falconer, MD   625 mg at 11/19/14 2036  . enoxaparin (LOVENOX) injection 40 mg  40 mg Subcutaneous Q24H Orvan Falconer, MD   40 mg at 11/20/14 0432  . feeding supplement (JEVITY 1.5 CAL/FIBER) liquid 1,000 mL  1,000 mL Per Tube Continuous Sinda Du, MD 110 mL/hr at 11/19/14 0531 1,000 mL at 11/19/14 0531  .  levothyroxine (SYNTHROID, LEVOTHROID) tablet 100 mcg  100 mcg Per Tube QAC breakfast Orvan Falconer, MD   100 mcg at 11/19/14 0849  . ondansetron (ZOFRAN) tablet 4 mg  4 mg Oral Q6H PRN Orvan Falconer, MD       Or  . ondansetron [facility]) injection 4 mg  4 mg Intravenous Q6H PRN Orvan Falconer, MD      . piperacillin-tazobactam (ZOSYN) IVPB 3.375 g  3.375 g Intravenous Q8H Orvan Falconer, MD   3.375 g at 11/20/14 0042  . pneumococcal 23 valent vaccine (PNU-IMMUNE) injection 0.5 mL   0.5 mL Intramuscular Tomorrow-1000 Orvan Falconer, MD      . polyethylene glycol (MIRALAX / GLYCOLAX) packet 17 g  17 g Oral Daily PRN Sinda Du, MD      . pravastatin (PRAVACHOL) tablet 20 mg  20 mg Oral q1800 Orvan Falconer, MD   20 mg at 11/19/14 1651  . vancomycin (VANCOCIN) 500 mg in sodium chloride 0.9 % 100 mL IVPB  500 mg Intravenous Q12H Sinda Du, MD   500 mg at 11/20/14 0041   Do not use this list as official medication orders. Please verify with discharge summary.  Discharge Medications:   Medication List    ASK your doctor about these medications        cholecalciferol 1000 UNITS tablet  Commonly known as:  VITAMIN D  Take 1,000 Units by mouth daily.     divalproex 500 MG 24 hr tablet  Commonly known as:  DEPAKOTE ER  Take 500 mg by mouth 2 (two) times daily.     feeding supplement (JEVITY 1.5 CAL/FIBER) Liqd  Place 1,000 mLs into feeding tube continuous. Initiate nocturnal feedings of Jevity 1.5 @ 100 ml/hr via PEG over 14 hour period     ferrous sulfate 325 (65 FE) MG tablet  Take 325 mg by mouth daily with breakfast.     furosemide 20 MG tablet  Commonly known as:  LASIX  Take 20 mg by mouth. 09/25/14 20 mg PO two times a day for SOB, [facility]     haloperidol 10 MG tablet  Commonly known as:  HALDOL  Place 1 tablet (10 mg total) into feeding tube every evening.     haloperidol decanoate 100 MG/ML injection  Commonly known as:  HALDOL DECANOATE  Inject 100 mg into the muscle every 28 (twenty-eight) days. Administered by [employer]     levETIRAcetam 250 MG tablet  Commonly known as:  KEPPRA  Take 250 mg by mouth daily.     levothyroxine 100 MCG tablet  Commonly known as:  SYNTHROID, LEVOTHROID  Place 1 tablet (100 mcg total) into feeding tube daily before breakfast.     loratadine 10 MG tablet  Commonly known as:  CLARITIN  Place 1 tablet (10 mg total) into feeding tube daily.     lovastatin 20 MG tablet  Commonly known as:  MEVACOR  Place 1 tablet  (20 mg total) into feeding tube daily.     oxybutynin 5 MG tablet  Commonly known as:  DITROPAN  Place 1 tablet (5 mg total) into feeding tube 2 (two) times daily.     polyethylene glycol powder powder  Commonly known as:  GLYCOLAX/MIRALAX  Place 17 g into feeding tube daily as needed. Dissolved in 8 ounces of water/juice daily     sulfamethoxazole-trimethoprim 800-160 MG tablet  Commonly known as:  BACTRIM DS,SEPTRA DS  Take 1 tablet by mouth 2 (two) times daily.  vitamin C 500 MG tablet  Commonly known as:  ASCORBIC ACID  Take 500 mg by mouth 2 (two) times daily.        Relevant Imaging Results:  Relevant Lab Results:  Recent Labs    Additional Information    Salome Arnt, [place]

## 2014-11-20 NOTE — Care Management Note (Signed)
Case Management Note  Patient Details  Name: Steven David MRN: [id number] Date of Birth: 1955/09/29  Subjective/Objective:                    Action/Plan:   Expected Discharge Date:                  Expected Discharge Plan:  Skilled Nursing Facility  In-House Referral:  Clinical Social Work  Discharge planning Services  CM Consult  Post Acute Care Choice:  NA Choice offered to:  NA  DME Arranged:    DME Agency:     HH Arranged:    [place] Agency:     Status of Service:  Completed, signed off  Medicare Important Message Given:  Yes Date Medicare IM Given:    Medicare IM give by:    Date Additional Medicare IM Given:    Additional Medicare Important Message give by:     If discussed at [place] of Stay Meetings, dates discussed:    Additional Comments: Pt having PICC line placed today. Will need long term IV AB at facility. Anticipate discharge over the weekend. CSW to arrange discharge once written. Christinia Gully Viola, RN 11/20/2014, 9:20 AM

## 2014-11-20 NOTE — Progress Notes (Addendum)
Peripherally Inserted Central Catheter/Midline Placement  The IV Nurse has discussed with the patient and/or persons authorized to consent for the patient, the purpose of this procedure and the potential benefits and risks involved with this procedure.  The benefits include less needle sticks, lab draws from the catheter and patient may be discharged home with the catheter.  Risks include, but not limited to, infection, bleeding, blood clot (thrombus formation), and puncture of an artery; nerve damage and irregular heat beat.  Alternatives to this procedure were also discussed.  PICC/Midline Placement Documentation  PICC / Midline Single Lumen 11/20/14 PICC Right Brachial 36 cm 0 cm (Active)  Indication for Insertion or Continuance of Line Prolonged intravenous therapies 11/20/2014  2:00 PM  Exposed Catheter (cm) 0 cm 11/20/2014  2:00 PM  Site Assessment Clean;Dry;Intact 11/20/2014  2:00 PM  Line Status Flushed;Capped (central line);Blood return noted 11/20/2014  2:00 PM  Dressing Type Transparent;Securing device 11/20/2014  2:00 PM  Dressing Status Clean;Dry;Intact;Antimicrobial disc in place 11/20/2014  2:00 PM  Line Care Connections checked and tightened 11/20/2014  2:00 PM  Line Adjustment (NICU/IV Team Only) No 11/20/2014  2:00 PM  Dressing Intervention New dressing 11/20/2014  2:00 PM  Dressing Change Due 11/27/14 11/20/2014  2:00 PM       Naaman Plummer 11/20/2014, 2:33 PM

## 2014-11-20 NOTE — Progress Notes (Signed)
Subjective: He has no complaints. His speech is very difficult to understand. He is not coughing much. Tube feedings are going okay  Objective: Vital signs in last 24 hours: Temp:  [97.9 F (36.6 C)-98 F (36.7 C)] 97.9 F (36.6 C) (11/11 0352) Pulse Rate:  [61-76] 61 (11/11 0352) Resp:  [20-21] 20 (11/11 0352) BP: (95-121)/(51-60) 95/60 mmHg (11/11 0352) SpO2:  [97 %-98 %] 97 % (11/11 0734) Weight change:  Last BM Date: 11/19/14  Intake/Output from previous day: 11/10 0701 - 11/11 0700 In: -  Out: 1700 [Urine:1700]  PHYSICAL EXAM General appearance: alert and no distress Resp: rhonchi bilaterally Cardio: regular rate and rhythm, S1, S2 normal, no murmur, click, rub or gallop GI: PEG tube looks okay Extremities: extremities normal, atraumatic, no cyanosis or edema  Lab Results:  Results for orders placed or performed during the hospital encounter of 11/18/14 (from the past 48 hour(s))  Comprehensive metabolic panel     Status: Abnormal   Collection Time: 11/19/14 12:51 AM  Result Value Ref Range   Sodium 141 135 - 145 mmol/L   Potassium 4.0 3.5 - 5.1 mmol/L   Chloride 101 101 - 111 mmol/L   CO2 31 22 - 32 mmol/L   Glucose, Bld 92 65 - 99 mg/dL   BUN 24 (H) 6 - 20 mg/dL   Creatinine, Ser 1.11 0.61 - 1.24 mg/dL   Calcium 9.6 8.9 - 10.3 mg/dL   Total Protein 8.5 (H) 6.5 - 8.1 g/dL   Albumin 3.6 3.5 - 5.0 g/dL   AST 16 15 - 41 U/L   ALT 9 (L) 17 - 63 U/L   Alkaline Phosphatase 65 38 - 126 U/L   Total Bilirubin 0.4 0.3 - 1.2 mg/dL   GFR calc non Af Amer >60 >60 mL/min   GFR calc Af Amer >60 >60 mL/min    Comment: (NOTE) The eGFR has been calculated using the CKD EPI equation. This calculation has not been validated in all clinical situations. eGFR's persistently <60 mL/min signify possible Chronic Kidney Disease.    Anion gap 9 5 - 15  Troponin I     Status: None   Collection Time: 11/19/14 12:51 AM  Result Value Ref Range   Troponin I <0.03 <0.031 ng/mL     Comment:        NO INDICATION OF MYOCARDIAL INJURY.   Lactic acid, plasma     Status: None   Collection Time: 11/19/14 12:51 AM  Result Value Ref Range   Lactic Acid, Venous 0.6 0.5 - 2.0 mmol/L  CBC with Differential     Status: Abnormal   Collection Time: 11/19/14 12:51 AM  Result Value Ref Range   WBC 12.9 (H) 4.0 - 10.5 K/uL    Comment: REPEATED TO VERIFY   RBC 3.93 (L) 4.22 - 5.81 MIL/uL   Hemoglobin 10.4 (L) 13.0 - 17.0 g/dL   HCT 33.8 (L) 39.0 - 52.0 %   MCV 86.0 78.0 - 100.0 fL   MCH 26.5 26.0 - 34.0 pg   MCHC 30.8 30.0 - 36.0 g/dL   RDW 15.9 (H) 11.5 - 15.5 %   Platelets 109 (L) 150 - 400 K/uL    Comment: REPEATED TO VERIFY SPECIMEN CHECKED FOR CLOTS    Neutrophils Relative % 76 %   Neutro Abs 9.8 (H) 1.7 - 7.7 K/uL   Lymphocytes Relative 13 %   Lymphs Abs 1.6 0.7 - 4.0 K/uL   Monocytes Relative 9 %   Monocytes Absolute 1.2 (  H) 0.1 - 1.0 K/uL   Eosinophils Relative 2 %   Eosinophils Absolute 0.2 0.0 - 0.7 K/uL   Basophils Relative 0 %   Basophils Absolute 0.0 0.0 - 0.1 K/uL   RBC Morphology ROULEAUX    Smear Review PLATELETS APPEAR ADEQUATE   Valproic acid level     Status: Abnormal   Collection Time: 11/19/14 12:51 AM  Result Value Ref Range   Valproic Acid Lvl 14 (L) 50.0 - 100.0 ug/mL  Urinalysis, Routine w reflex microscopic     Status: Abnormal   Collection Time: 11/19/14  1:52 AM  Result Value Ref Range   Color, Urine YELLOW YELLOW   APPearance CLEAR CLEAR   Specific Gravity, Urine 1.015 1.005 - 1.030   pH 8.5 (H) 5.0 - 8.0   Glucose, UA NEGATIVE NEGATIVE mg/dL   Hgb urine dipstick LARGE (A) NEGATIVE   Bilirubin Urine NEGATIVE NEGATIVE   Ketones, ur NEGATIVE NEGATIVE mg/dL   Protein, ur NEGATIVE NEGATIVE mg/dL   Urobilinogen, UA 1.0 0.0 - 1.0 mg/dL   Nitrite NEGATIVE NEGATIVE   Leukocytes, UA NEGATIVE NEGATIVE  Urine microscopic-add on     Status: Abnormal   Collection Time: 11/19/14  1:52 AM  Result Value Ref Range   Squamous Epithelial / LPF  FEW (A) RARE   WBC, UA 0-2 <3 WBC/hpf   RBC / HPF TOO NUMEROUS TO COUNT <3 RBC/hpf   Bacteria, UA FEW (A) RARE  Lactic acid, plasma     Status: None   Collection Time: 11/19/14  2:59 AM  Result Value Ref Range   Lactic Acid, Venous 0.5 0.5 - 2.0 mmol/L  MRSA PCR Screening     Status: None   Collection Time: 11/19/14  5:30 AM  Result Value Ref Range   MRSA by PCR NEGATIVE NEGATIVE    Comment:        The GeneXpert MRSA Assay (FDA approved for NASAL specimens only), is one component of a comprehensive MRSA colonization surveillance program. It is not intended to diagnose MRSA infection nor to guide or monitor treatment for MRSA infections.   CBC     Status: Abnormal   Collection Time: 11/19/14  6:16 AM  Result Value Ref Range   WBC 11.2 (H) 4.0 - 10.5 K/uL   RBC 3.82 (L) 4.22 - 5.81 MIL/uL   Hemoglobin 10.1 (L) 13.0 - 17.0 g/dL   HCT 32.9 (L) 39.0 - 52.0 %   MCV 86.1 78.0 - 100.0 fL   MCH 26.4 26.0 - 34.0 pg   MCHC 30.7 30.0 - 36.0 g/dL   RDW 15.9 (H) 11.5 - 15.5 %   Platelets 100 (L) 150 - 400 K/uL    Comment: SPECIMEN CHECKED FOR CLOTS PLATELET COUNT CONFIRMED BY SMEAR   TSH     Status: None   Collection Time: 11/19/14  6:16 AM  Result Value Ref Range   TSH 0.695 0.350 - 4.500 uIU/mL  Basic metabolic panel     Status: Abnormal   Collection Time: 11/19/14  6:16 AM  Result Value Ref Range   Sodium 141 135 - 145 mmol/L   Potassium 4.1 3.5 - 5.1 mmol/L   Chloride 103 101 - 111 mmol/L   CO2 29 22 - 32 mmol/L   Glucose, Bld 109 (H) 65 - 99 mg/dL   BUN 22 (H) 6 - 20 mg/dL   Creatinine, Ser 1.06 0.61 - 1.24 mg/dL   Calcium 9.2 8.9 - 10.3 mg/dL   GFR calc non Af Amer >  60 >60 mL/min   GFR calc Af Amer >60 >60 mL/min    Comment: (NOTE) The eGFR has been calculated using the CKD EPI equation. This calculation has not been validated in all clinical situations. eGFR's persistently <60 mL/min signify possible Chronic Kidney Disease.    Anion gap 9 5 - 15    ABGS No  results for input(s): PHART, PO2ART, TCO2, HCO3 in the last 72 hours.  Invalid input(s): PCO2 CULTURES Recent Results (from the past 240 hour(s))  MRSA PCR Screening     Status: None   Collection Time: 11/19/14  5:30 AM  Result Value Ref Range Status   MRSA by PCR NEGATIVE NEGATIVE Final    Comment:        The GeneXpert MRSA Assay (FDA approved for NASAL specimens only), is one component of a comprehensive MRSA colonization surveillance program. It is not intended to diagnose MRSA infection nor to guide or monitor treatment for MRSA infections.    Studies/Results: Dg Chest 2 View  11/19/2014  CLINICAL DATA:  Weakness. EXAM: CHEST  2 VIEW COMPARISON:  09/03/2014 FINDINGS: Asymmetric airspace opacity in the left lower lobe with reticular nodular appearance. Normal heart size and mediastinal contours. No edema, effusion, or pneumothorax. IMPRESSION: Left lower lobe airspace disease which could reflect pneumonia or aspiration. Electronically Signed   By: Monte Fantasia M.D.   On: 11/19/2014 00:46   Ct Head Wo Contrast  11/19/2014  CLINICAL DATA:  Initial evaluation for generalized weakness, tremors. EXAM: CT HEAD WITHOUT CONTRAST TECHNIQUE: Contiguous axial images were obtained from the base of the skull through the vertex without intravenous contrast. COMPARISON:  Prior CT from 09/02/2014. FINDINGS: Generalized cerebral atrophy is stable from previous. Mild chronic small vessel ischemic disease. Remote lacunar infarct within the left caudate head. No acute large vessel territory infarct. No intracranial hemorrhage. No mass lesion, midline shift or mass effect. No hydrocephalus. No extra-axial fluid collection para Scalp soft tissues within normal limits. No acute abnormality about the orbits. Paranasal sinuses and mastoid air cells are clear. Calvarium is intact. IMPRESSION: 1. No acute intracranial process. 2. Stable atrophy with chronic small vessel ischemic disease. 3. Remote lacunar  infarct within the left caudate head, stable. Electronically Signed   By: Jeannine Boga M.D.   On: 11/19/2014 00:59     Medications:  Prior to Admission:  Prescriptions prior to admission  Medication Sig Dispense Refill Last Dose  . cholecalciferol (VITAMIN D) 1000 UNITS tablet Take 1,000 Units by mouth daily.   11/18/2014 at Unknown time  . divalproex (DEPAKOTE ER) 500 MG 24 hr tablet Take 500 mg by mouth 2 (two) times daily.   11/18/2014 at Unknown time  . ferrous sulfate 325 (65 FE) MG tablet Take 325 mg by mouth daily with breakfast.   11/18/2014 at Unknown time  . furosemide (LASIX) 20 MG tablet Take 20 mg by mouth. 09/25/14 20 mg PO two times a day for SOB, [facility] MAR   11/18/2014 at Unknown time  . haloperidol (HALDOL) 10 MG tablet Place 1 tablet (10 mg total) into feeding tube every evening.   11/18/2014 at Unknown time  . haloperidol decanoate (HALDOL DECANOATE) 100 MG/ML injection Inject 100 mg into the muscle every 28 (twenty-eight) days. Administered by [facility]   Past Month at Unknown time  . levETIRAcetam (KEPPRA) 250 MG tablet Take 250 mg by mouth daily.   11/18/2014 at Unknown time  . levothyroxine (SYNTHROID, LEVOTHROID) 100 MCG tablet Place 1 tablet (100 mcg total)  into feeding tube daily before breakfast.   11/18/2014 at Unknown time  . loratadine (CLARITIN) 10 MG tablet Place 1 tablet (10 mg total) into feeding tube daily.   11/18/2014 at Unknown time  . lovastatin (MEVACOR) 20 MG tablet Place 1 tablet (20 mg total) into feeding tube daily.   11/18/2014 at Unknown time  . Nutritional Supplements (FEEDING SUPPLEMENT, JEVITY 1.5 CAL/FIBER,) LIQD Place 1,000 mLs into feeding tube continuous. Initiate nocturnal feedings of Jevity 1.5 @ 100 ml/hr via PEG over 14 hour period   11/18/2014 at Unknown time  . oxybutynin (DITROPAN) 5 MG tablet Place 1 tablet (5 mg total) into feeding tube 2 (two) times daily. 60 tablet 0 11/18/2014 at Unknown time  . polyethylene glycol powder  (GLYCOLAX/MIRALAX) powder Place 17 g into feeding tube daily as needed. Dissolved in 8 ounces of water/juice daily 255 g 0 11/18/2014 at Unknown time  . sulfamethoxazole-trimethoprim (BACTRIM DS,SEPTRA DS) 800-160 MG tablet Take 1 tablet by mouth 2 (two) times daily.   11/18/2014 at Unknown time  . vitamin C (ASCORBIC ACID) 500 MG tablet Take 500 mg by mouth 2 (two) times daily.   11/18/2014 at Unknown time   Scheduled: . antiseptic oral rinse  7 mL Mouth Rinse q12n4p  . chlorhexidine  15 mL Mouth Rinse BID  . divalproex  625 mg Oral Q12H  . enoxaparin (LOVENOX) injection  40 mg Subcutaneous Q24H  . levothyroxine  100 mcg Per Tube QAC breakfast  . piperacillin-tazobactam  3.375 g Intravenous Q8H  . pneumococcal 23 valent vaccine  0.5 mL Intramuscular Tomorrow-1000  . pravastatin  20 mg Oral q1800  . vancomycin  500 mg Intravenous Q12H   Continuous: . sodium chloride 100 mL/hr at 11/20/14 0309  . feeding supplement (JEVITY 1.5 CAL/FIBER) 1,000 mL (11/19/14 0531)   POE:UMPNTIRWERX **OR** ondansetron (ZOFRAN) IV, polyethylene glycol  Assesment: He was admitted with pneumonia. Although this is healthcare associated I think it is almost certainly aspiration. He seems to be improving.  He has protein calorie malnutrition and is on tube feedings.  He has schizophrenia unchanged.  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 He is approaching being able to be discharged back to his skilled care facility    LOS: 1 day   Ossie Yebra L 11/20/2014, 8:34 AM

## 2014-11-20 NOTE — Care Management Important Message (Signed)
Important Message  Patient Details  Name: Steven David MRN: [id number] Date of Birth: [birth year]   Medicare Important Message Given:  Yes    Joylene Draft, RN 11/20/2014, 9:20 AM

## 2014-11-20 NOTE — NC FL2 (Signed)
[place] LEVEL OF CARE SCREENING TOOL     IDENTIFICATION  Patient Name: Steven David Birthdate: [birth year] Sex: male Admission Date (Current Location): 11/18/2014  [place] and [place] Number: [place] [id number] [place] and Address:  [facility] [place] [address], [place]      Provider Number: [id number]  Attending Physician Name and Address:  Sinda Du, MD  Relative Name and Phone Number:       Current Level of Care: Hospital Recommended Level of Care: [place] Prior Approval Number:    Date Approved/Denied:   PASRR Number:    Discharge Plan: SNF    Current Diagnoses: Patient Active Problem List   Diagnosis Date Noted  . Malnutrition of moderate degree 11/20/2014  . HCAP (healthcare-associated pneumonia) 11/19/2014  . Postoperative anemia due to acute blood loss 09/07/2014  . Seizure ([place])   . Incarcerated inguinal hernia 09/01/2014  . Respiratory failure, acute ([place]) 09/01/2014  . Metabolic acidosis 0000000  . Incarcerated hernia 09/01/2014  . Septic shock ([place]) 09/01/2014  . AKI (acute kidney injury) ([place])   . Protein-calorie malnutrition, severe ([place]) 04/13/2013  . Sepsis ([place]) 04/11/2013  . H/O stroke within last year 04/11/2013  . Hypothyroidism 04/11/2013  . Pneumonia 04/11/2013  . Thrombocytopenia ([place]) 04/11/2013  . Schizophrenia ([place]) 04/11/2013  . Malnutrition ([place]) 04/11/2013  . SIRS (systemic inflammatory response syndrome) ([facility]) 04/11/2013    Orientation ACTIVITIES/SOCIAL BLADDER RESPIRATION     (unclear)    Indwelling catheter O2 (As needed) (2L)  BEHAVIORAL SYMPTOMS/MOOD NEUROLOGICAL BOWEL NUTRITION STATUS    Convulsions/Seizures (history) Colostomy Feeding tube  PHYSICIAN VISITS COMMUNICATION OF NEEDS Height & Weight Skin    Verbally (speech is very hard to understand) 6' (182.9 cm) 120 lbs. Normal          AMBULATORY STATUS RESPIRATION    Supervision limited O2 (As  needed) (2L)      Personal Care Assistance Level of Assistance  Bathing, Feeding, Dressing Bathing Assistance: Limited assistance Feeding assistance: Limited assistance Dressing Assistance: Limited assistance      Functional Limitations Info  Sight, Hearing, Speech Sight Info: Adequate Hearing Info: Adequate Speech Info: Impaired       SPECIAL CARE FACTORS FREQUENCY                      Additional Factors Info  Code Status, Allergies, Psychotropic Code Status Info: Full code Allergies Info: No known allergies Psychotropic Info: Depakote sprinkle         Current Medications (11/20/2014): Current Facility-Administered Medications  Medication Dose Route Frequency Provider Last Rate Last Dose  . 0.9 %  sodium chloride infusion   Intravenous Continuous Francine Graven, DO 100 mL/hr at 11/20/14 0309    . antiseptic oral rinse (CPC / CETYLPYRIDINIUM CHLORIDE 0.05%) solution 7 mL  7 mL Mouth Rinse q12n4p Orvan Falconer, MD   7 mL at 11/19/14 1600  . chlorhexidine (PERIDEX) 0.12 % solution 15 mL  15 mL Mouth Rinse BID Orvan Falconer, MD   15 mL at 11/19/14 2037  . divalproex (DEPAKOTE SPRINKLE) capsule 625 mg  625 mg Oral Q12H Orvan Falconer, MD   625 mg at 11/19/14 2036  . enoxaparin (LOVENOX) injection 40 mg  40 mg Subcutaneous Q24H Orvan Falconer, MD   40 mg at 11/20/14 0432  . feeding supplement (JEVITY 1.5 CAL/FIBER) liquid 1,000 mL  1,000 mL Per Tube Continuous Sinda Du, MD 110 mL/hr at 11/19/14 0531 1,000 mL at 11/19/14 0531  .  levothyroxine (SYNTHROID, LEVOTHROID) tablet 100 mcg  100 mcg Per Tube QAC breakfast Orvan Falconer, MD   100 mcg at 11/20/14 0835  . ondansetron (ZOFRAN) tablet 4 mg  4 mg Oral Q6H PRN Orvan Falconer, MD       Or  . ondansetron [facility]) injection 4 mg  4 mg Intravenous Q6H PRN Orvan Falconer, MD      . piperacillin-tazobactam (ZOSYN) IVPB 3.375 g  3.375 g Intravenous Q8H Orvan Falconer, MD   3.375 g at 11/20/14 0042  . pneumococcal 23 valent vaccine (PNU-IMMUNE) injection 0.5 mL  0.5  mL Intramuscular Tomorrow-1000 Orvan Falconer, MD      . polyethylene glycol (MIRALAX / GLYCOLAX) packet 17 g  17 g Oral Daily PRN Sinda Du, MD      . pravastatin (PRAVACHOL) tablet 20 mg  20 mg Oral q1800 Orvan Falconer, MD   20 mg at 11/19/14 1651   Do not use this list as official medication orders. Please verify with discharge summary.  Discharge Medications:   Medication List    ASK your doctor about these medications        cholecalciferol 1000 UNITS tablet  Commonly known as:  VITAMIN D  Take 1,000 Units by mouth daily.     divalproex 500 MG 24 hr tablet  Commonly known as:  DEPAKOTE ER  Take 500 mg by mouth 2 (two) times daily.     feeding supplement (JEVITY 1.5 CAL/FIBER) Liqd  Place 1,000 mLs into feeding tube continuous. Initiate nocturnal feedings of Jevity 1.5 @ 100 ml/hr via PEG over 14 hour period     ferrous sulfate 325 (65 FE) MG tablet  Take 325 mg by mouth daily with breakfast.     furosemide 20 MG tablet  Commonly known as:  LASIX  Take 20 mg by mouth. 09/25/14 20 mg PO two times a day for SOB, [facility] MAR     haloperidol 10 MG tablet  Commonly known as:  HALDOL  Place 1 tablet (10 mg total) into feeding tube every evening.     haloperidol decanoate 100 MG/ML injection  Commonly known as:  HALDOL DECANOATE  Inject 100 mg into the muscle every 28 (twenty-eight) days. Administered by [facility]     levETIRAcetam 250 MG tablet  Commonly known as:  KEPPRA  Take 250 mg by mouth daily.     levothyroxine 100 MCG tablet  Commonly known as:  SYNTHROID, LEVOTHROID  Place 1 tablet (100 mcg total) into feeding tube daily before breakfast.     loratadine 10 MG tablet  Commonly known as:  CLARITIN  Place 1 tablet (10 mg total) into feeding tube daily.     lovastatin 20 MG tablet  Commonly known as:  MEVACOR  Place 1 tablet (20 mg total) into feeding tube daily.     oxybutynin 5 MG tablet  Commonly known as:  DITROPAN  Place 1 tablet (5 mg total) into feeding  tube 2 (two) times daily.     polyethylene glycol powder powder  Commonly known as:  GLYCOLAX/MIRALAX  Place 17 g into feeding tube daily as needed. Dissolved in 8 ounces of water/juice daily     sulfamethoxazole-trimethoprim 800-160 MG tablet  Commonly known as:  BACTRIM DS,SEPTRA DS  Take 1 tablet by mouth 2 (two) times daily.     vitamin C 500 MG tablet  Commonly known as:  ASCORBIC ACID  Take 500 mg by mouth 2 (two) times daily.  Relevant Imaging Results:  Relevant Lab Results:  Recent Labs    Additional Information PICC line for 10 days IV antibiotics  Salome Arnt, [place]

## 2014-11-20 NOTE — Progress Notes (Signed)
Received telephone consent from [place] at 1000 for PICC line to be placed. Telephone consent was verified by second nurse Charm Barges.

## 2014-11-21 MED ORDER — DIVALPROEX SODIUM 125 MG PO CSDR: 650.0000 mg | DELAYED_RELEASE_CAPSULE | Freq: Two times a day (BID) | ORAL | Status: DC

## 2014-11-21 MED ORDER — HEPARIN SOD (PORK) LOCK FLUSH 100 UNIT/ML IV SOLN: 250.0000 [IU] | INTRAVENOUS | Status: DC | PRN

## 2014-11-21 MED ORDER — PIPERACILLIN-TAZOBACTAM 3.375 G IVPB: 3.3750 g | Freq: Three times a day (TID) | INTRAVENOUS | Status: DC

## 2014-11-21 MED ORDER — SODIUM CHLORIDE 0.9 % IJ SOLN: 10.0000 mL | INTRAMUSCULAR | Status: DC | PRN

## 2014-11-21 NOTE — Discharge Summary (Signed)
Physician Discharge Summary  Patient ID: JAHIER LUNDAY MRN: [id number] DOB/AGE: 1955/01/13 59 y.o. Primary Care Physician:Shadaya Marschner L, MD Admit date: 11/18/2014 Discharge date: 11/21/2014    Discharge Diagnoses:   Principal Problem:   Pneumonia Active Problems:   Hypothyroidism   Schizophrenia ([place])   Protein-calorie malnutrition, severe ([place])   Seizure ([place])   HCAP (healthcare-associated pneumonia)   Malnutrition of moderate degree sepsis    Medication List    STOP taking these medications        sulfamethoxazole-trimethoprim 800-160 MG tablet  Commonly known as:  BACTRIM DS,SEPTRA DS      TAKE these medications        cholecalciferol 1000 UNITS tablet  Commonly known as:  VITAMIN D  Take 1,000 Units by mouth daily.     divalproex 125 MG capsule  Commonly known as:  DEPAKOTE SPRINKLE  Take 5 capsules (625 mg total) by mouth every 12 (twelve) hours.     feeding supplement (JEVITY 1.5 CAL/FIBER) Liqd  Place 1,000 mLs into feeding tube continuous. Initiate nocturnal feedings of Jevity 1.5 @ 100 ml/hr via PEG over 14 hour period     ferrous sulfate 325 (65 FE) MG tablet  Take 325 mg by mouth daily with breakfast.     furosemide 20 MG tablet  Commonly known as:  LASIX  Take 20 mg by mouth. 09/25/14 20 mg PO two times a day for SOB, [facility]     haloperidol 10 MG tablet  Commonly known as:  HALDOL  Place 1 tablet (10 mg total) into feeding tube every evening.     haloperidol decanoate 100 MG/ML injection  Commonly known as:  HALDOL DECANOATE  Inject 100 mg into the muscle every 28 (twenty-eight) days. Administered by [facility]     levETIRAcetam 250 MG tablet  Commonly known as:  KEPPRA  Take 250 mg by mouth daily.     levothyroxine 100 MCG tablet  Commonly known as:  SYNTHROID, LEVOTHROID  Place 1 tablet (100 mcg total) into feeding tube daily before breakfast.     loratadine 10 MG tablet  Commonly known as:  CLARITIN  Place 1 tablet (10 mg  total) into feeding tube daily.     lovastatin 20 MG tablet  Commonly known as:  MEVACOR  Place 1 tablet (20 mg total) into feeding tube daily.     oxybutynin 5 MG tablet  Commonly known as:  DITROPAN  Place 1 tablet (5 mg total) into feeding tube 2 (two) times daily.     piperacillin-tazobactam 3.375 GM/50ML IVPB  Commonly known as:  ZOSYN  Inject 50 mLs (3.375 g total) into the vein every 8 (eight) hours.     polyethylene glycol powder powder  Commonly known as:  GLYCOLAX/MIRALAX  Place 17 g into feeding tube daily as needed. Dissolved in 8 ounces of water/juice daily     sodium chloride 0.9 % injection  10-40 mLs by Intracatheter route as needed (flush).     vitamin C 500 MG tablet  Commonly known as:  ASCORBIC ACID  Take 500 mg by mouth 2 (two) times daily.        Discharged Condition: Improved    Consults: None  Significant Diagnostic Studies: Dg Chest 2 View  11/19/2014  CLINICAL DATA:  Weakness. EXAM: CHEST  2 VIEW COMPARISON:  09/03/2014 FINDINGS: Asymmetric airspace opacity in the left lower lobe with reticular nodular appearance. Normal heart size and mediastinal contours. No edema, effusion, or pneumothorax. IMPRESSION: Left lower lobe  airspace disease which could reflect pneumonia or aspiration. Electronically Signed   By: Monte Fantasia M.D.   On: 11/19/2014 00:46   Ct Head Wo Contrast  11/19/2014  CLINICAL DATA:  Initial evaluation for generalized weakness, tremors. EXAM: CT HEAD WITHOUT CONTRAST TECHNIQUE: Contiguous axial images were obtained from the base of the skull through the vertex without intravenous contrast. COMPARISON:  Prior CT from 09/02/2014. FINDINGS: Generalized cerebral atrophy is stable from previous. Mild chronic small vessel ischemic disease. Remote lacunar infarct within the left caudate head. No acute large vessel territory infarct. No intracranial hemorrhage. No mass lesion, midline shift or mass effect. No hydrocephalus. No extra-axial  fluid collection para Scalp soft tissues within normal limits. No acute abnormality about the orbits. Paranasal sinuses and mastoid air cells are clear. Calvarium is intact. IMPRESSION: 1. No acute intracranial process. 2. Stable atrophy with chronic small vessel ischemic disease. 3. 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 Electronically Signed   By: Jeannine Boga M.D.   On: 11/19/2014 00:59   Dg Chest Port 1 View  11/20/2014  CLINICAL DATA:  PICC line placement EXAM: PORTABLE CHEST 1 VIEW COMPARISON:  Portable exam 1455 hours compared to 11/19/2014 FINDINGS: RIGHT arm PICC line tip projects over mid SVC. Normal heart size, mediastinal contours and pulmonary vascularity. Increased atelectasis versus infiltrate at RIGHT lung base. Mild persistent increased markings at LEFT base appear unchanged, likely persistent infiltrate. Remaining lungs clear. No pleural effusion or pneumothorax. Bones unremarkable. IMPRESSION: Tip of RIGHT arm PICC line projects over mid SVC. Persistent LEFT lower lobe infiltrate with increased atelectasis versus infiltrate at RIGHT lower lobe. Electronically Signed   By: Lavonia Dana M.D.   On: 11/20/2014 15:06    Lab Results: Basic Metabolic Panel:  Recent Labs  11/19/14 0051 11/19/14 0616  NA 141 141  K 4.0 4.1  CL 101 103  CO2 31 29  GLUCOSE 92 109*  BUN 24* 22*  CREATININE 1.11 1.06  CALCIUM 9.6 9.2   Liver Function Tests:  Recent Labs  11/19/14 0051  AST 16  ALT 9*  ALKPHOS 65  BILITOT 0.4  PROT 8.5*  ALBUMIN 3.6     CBC:  Recent Labs  11/19/14 0051 11/19/14 0616  WBC 12.9* 11.2*  NEUTROABS 9.8*  --   HGB 10.4* 10.1*  HCT 33.8* 32.9*  MCV 86.0 86.1  PLT 109* 100*    Recent Results (from the past 240 hour(s))  Urine culture     Status: None   Collection Time: 11/19/14  1:52 AM  Result Value Ref Range Status   Specimen Description URINE, CATHETERIZED  Final   Special Requests NONE  Final   Culture   Final     NO GROWTH 1 DAY Performed at [facility]    Report Status 11/20/2014 FINAL  Final  MRSA PCR Screening     Status: None   Collection Time: 11/19/14  5:30 AM  Result Value Ref Range Status   MRSA by PCR NEGATIVE NEGATIVE Final    Comment:        The GeneXpert MRSA Assay (FDA approved for NASAL specimens only), is one component of a comprehensive MRSA colonization surveillance program. It is not intended to diagnose MRSA infection nor to guide or monitor treatment for MRSA infections.      Hospital Course: This is a 59 year old who is a resident at a skilled care facility and who was brought to the emergency department because of increasing problems with  shortness of breath and hypotension. He was thought to be septic on admission and responded well to IV fluids. He has a feeding tube in place and has severe dysphagia. He was treated with IV antibiotics it was felt that his pneumonia was likely related to aspiration and his antibiotics were narrowed to treat aspiration pneumonia. Level of his seizure medication was somewhat low and this was adjusted. I did not get another speech evaluation because his last speech evaluation showed that he had severe aspiration and clearly he is aspirated again.  Discharge Exam: Blood pressure 107/56, pulse 79, temperature 97.8 F (36.6 C), temperature source Oral, resp. rate 20, height 6' (1.829 m), weight 54.8 kg (120 lb 13 oz), SpO2 96 %. He is awake and alert. Tube feeding in place. Chest with rhonchi bilaterally. He is very difficult to understand  Disposition: Back to skilled care facility. He should be nothing by mouth except for ice chips. He needs IV Zosyn for 10 days. His PICC line can be removed after that. He should have routine PICC line care. Continue with his tube feedings. Medications that are described as being given by mouth should be given per the tube. At some point discussion with his caretakers who are acting as his advocates  should be undertaken to see if they want him to be able to eat for comfort but at that point I think he would need to have DO NOT RESUSCITATE status because he is definitely likely to aspirate again      Discharge Instructions    Discharge to SNF when bed available    Complete by:  As directed              Signed: Crissie Aloi L   11/21/2014, 10:16 AM

## 2014-11-21 NOTE — Progress Notes (Signed)
He is doing about the same. No new complaints. He had some low blood pressure yesterday but looks okay today. He is ready for transfer back to the skilled care facility

## 2014-11-21 NOTE — Progress Notes (Signed)
Aide notified nurse of patient's low bp.  Notified MD on call.  Received order for 500 cc bolus.  Bolus given and bp increased to wnl.  Will continue to monitor patient.

## 2014-11-22 DIAGNOSIS — H109 Unspecified conjunctivitis: Secondary | ICD-10-CM | POA: Diagnosis not present

## 2014-11-22 DIAGNOSIS — R1310 Dysphagia, unspecified: Secondary | ICD-10-CM | POA: Diagnosis not present

## 2014-11-22 DIAGNOSIS — A419 Sepsis, unspecified organism: Secondary | ICD-10-CM | POA: Diagnosis not present

## 2014-11-22 DIAGNOSIS — M6281 Muscle weakness (generalized): Secondary | ICD-10-CM | POA: Diagnosis not present

## 2014-11-22 DIAGNOSIS — R6510 Systemic inflammatory response syndrome (SIRS) of non-infectious origin without acute organ dysfunction: Secondary | ICD-10-CM | POA: Diagnosis not present

## 2014-11-22 DIAGNOSIS — E785 Hyperlipidemia, unspecified: Secondary | ICD-10-CM | POA: Diagnosis not present

## 2014-11-22 DIAGNOSIS — Z7401 Bed confinement status: Secondary | ICD-10-CM | POA: Diagnosis not present

## 2014-11-22 DIAGNOSIS — E039 Hypothyroidism, unspecified: Secondary | ICD-10-CM | POA: Diagnosis not present

## 2014-11-22 DIAGNOSIS — E46 Unspecified protein-calorie malnutrition: Secondary | ICD-10-CM | POA: Diagnosis not present

## 2014-11-22 DIAGNOSIS — N179 Acute kidney failure, unspecified: Secondary | ICD-10-CM | POA: Diagnosis not present

## 2014-11-22 DIAGNOSIS — R251 Tremor, unspecified: Secondary | ICD-10-CM | POA: Diagnosis not present

## 2014-11-22 DIAGNOSIS — K4030 Unilateral inguinal hernia, with obstruction, without gangrene, not specified as recurrent: Secondary | ICD-10-CM | POA: Diagnosis not present

## 2014-11-22 DIAGNOSIS — J690 Pneumonitis due to inhalation of food and vomit: Secondary | ICD-10-CM | POA: Diagnosis not present

## 2014-11-22 DIAGNOSIS — R41841 Cognitive communication deficit: Secondary | ICD-10-CM | POA: Diagnosis not present

## 2014-11-22 DIAGNOSIS — E43 Unspecified severe protein-calorie malnutrition: Secondary | ICD-10-CM | POA: Diagnosis not present

## 2014-11-22 DIAGNOSIS — H9190 Unspecified hearing loss, unspecified ear: Secondary | ICD-10-CM | POA: Diagnosis not present

## 2014-11-22 DIAGNOSIS — D62 Acute posthemorrhagic anemia: Secondary | ICD-10-CM | POA: Diagnosis not present

## 2014-11-22 DIAGNOSIS — K9422 Gastrostomy infection: Secondary | ICD-10-CM | POA: Diagnosis not present

## 2014-11-22 DIAGNOSIS — F209 Schizophrenia, unspecified: Secondary | ICD-10-CM | POA: Diagnosis not present

## 2014-11-22 DIAGNOSIS — E872 Acidosis: Secondary | ICD-10-CM | POA: Diagnosis not present

## 2014-11-22 DIAGNOSIS — R569 Unspecified convulsions: Secondary | ICD-10-CM | POA: Diagnosis not present

## 2014-11-22 DIAGNOSIS — D72829 Elevated white blood cell count, unspecified: Secondary | ICD-10-CM | POA: Diagnosis not present

## 2014-11-22 DIAGNOSIS — K460 Unspecified abdominal hernia with obstruction, without gangrene: Secondary | ICD-10-CM | POA: Diagnosis not present

## 2014-11-22 DIAGNOSIS — J189 Pneumonia, unspecified organism: Secondary | ICD-10-CM | POA: Diagnosis not present

## 2014-11-22 DIAGNOSIS — D696 Thrombocytopenia, unspecified: Secondary | ICD-10-CM | POA: Diagnosis not present

## 2014-11-22 DIAGNOSIS — Z9981 Dependence on supplemental oxygen: Secondary | ICD-10-CM | POA: Diagnosis not present

## 2014-11-22 DIAGNOSIS — R6521 Severe sepsis with septic shock: Secondary | ICD-10-CM | POA: Diagnosis not present

## 2014-11-22 DIAGNOSIS — Z8673 Personal history of transient ischemic attack (TIA), and cerebral infarction without residual deficits: Secondary | ICD-10-CM | POA: Diagnosis not present

## 2014-11-22 DIAGNOSIS — J9600 Acute respiratory failure, unspecified whether with hypoxia or hypercapnia: Secondary | ICD-10-CM | POA: Diagnosis not present

## 2014-11-22 NOTE — Discharge Summary (Signed)
Apparently was some sort of problem with the discharge order yesterday so I have reordered that today. There have been no changes in his orders remain the same

## 2014-11-22 NOTE — Progress Notes (Signed)
There was apparently some sort of problem with the discharge order yesterday so I have reordered discharge to skilled care facility this morning he is ready for discharge.

## 2014-11-23 DIAGNOSIS — R569 Unspecified convulsions: Secondary | ICD-10-CM | POA: Diagnosis not present

## 2014-11-23 DIAGNOSIS — D72829 Elevated white blood cell count, unspecified: Secondary | ICD-10-CM | POA: Diagnosis not present

## 2014-11-23 DIAGNOSIS — H109 Unspecified conjunctivitis: Secondary | ICD-10-CM | POA: Diagnosis not present

## 2014-11-23 DIAGNOSIS — J690 Pneumonitis due to inhalation of food and vomit: Secondary | ICD-10-CM | POA: Diagnosis not present

## 2014-11-23 DIAGNOSIS — K9422 Gastrostomy infection: Secondary | ICD-10-CM | POA: Diagnosis not present

## 2014-11-23 DIAGNOSIS — R1310 Dysphagia, unspecified: Secondary | ICD-10-CM | POA: Diagnosis not present

## 2014-11-24 DIAGNOSIS — H109 Unspecified conjunctivitis: Secondary | ICD-10-CM | POA: Diagnosis not present

## 2014-11-24 DIAGNOSIS — J690 Pneumonitis due to inhalation of food and vomit: Secondary | ICD-10-CM | POA: Diagnosis not present

## 2014-11-24 DIAGNOSIS — D72829 Elevated white blood cell count, unspecified: Secondary | ICD-10-CM | POA: Diagnosis not present

## 2014-11-24 DIAGNOSIS — K9422 Gastrostomy infection: Secondary | ICD-10-CM | POA: Diagnosis not present

## 2014-11-24 DIAGNOSIS — R1310 Dysphagia, unspecified: Secondary | ICD-10-CM | POA: Diagnosis not present

## 2014-11-24 DIAGNOSIS — R569 Unspecified convulsions: Secondary | ICD-10-CM | POA: Diagnosis not present

## 2014-11-30 DIAGNOSIS — H109 Unspecified conjunctivitis: Secondary | ICD-10-CM | POA: Diagnosis not present

## 2014-11-30 DIAGNOSIS — D72829 Elevated white blood cell count, unspecified: Secondary | ICD-10-CM | POA: Diagnosis not present

## 2014-11-30 DIAGNOSIS — R1310 Dysphagia, unspecified: Secondary | ICD-10-CM | POA: Diagnosis not present

## 2014-11-30 DIAGNOSIS — J690 Pneumonitis due to inhalation of food and vomit: Secondary | ICD-10-CM | POA: Diagnosis not present

## 2014-12-07 DIAGNOSIS — R1310 Dysphagia, unspecified: Secondary | ICD-10-CM | POA: Diagnosis not present

## 2014-12-07 DIAGNOSIS — E162 Hypoglycemia, unspecified: Secondary | ICD-10-CM | POA: Diagnosis not present

## 2014-12-07 DIAGNOSIS — B370 Candidal stomatitis: Secondary | ICD-10-CM | POA: Diagnosis not present

## 2014-12-07 DIAGNOSIS — E46 Unspecified protein-calorie malnutrition: Secondary | ICD-10-CM | POA: Diagnosis not present

## 2014-12-14 DIAGNOSIS — R1313 Dysphagia, pharyngeal phase: Secondary | ICD-10-CM | POA: Diagnosis not present

## 2014-12-14 DIAGNOSIS — D696 Thrombocytopenia, unspecified: Secondary | ICD-10-CM | POA: Diagnosis not present

## 2014-12-14 DIAGNOSIS — B370 Candidal stomatitis: Secondary | ICD-10-CM | POA: Diagnosis not present

## 2014-12-14 DIAGNOSIS — Z8673 Personal history of transient ischemic attack (TIA), and cerebral infarction without residual deficits: Secondary | ICD-10-CM | POA: Diagnosis not present

## 2014-12-14 DIAGNOSIS — D509 Iron deficiency anemia, unspecified: Secondary | ICD-10-CM | POA: Diagnosis not present

## 2014-12-14 DIAGNOSIS — E162 Hypoglycemia, unspecified: Secondary | ICD-10-CM | POA: Diagnosis not present

## 2014-12-14 DIAGNOSIS — R1310 Dysphagia, unspecified: Secondary | ICD-10-CM | POA: Diagnosis not present

## 2014-12-14 DIAGNOSIS — E46 Unspecified protein-calorie malnutrition: Secondary | ICD-10-CM | POA: Diagnosis not present

## 2014-12-14 DIAGNOSIS — K1379 Other lesions of oral mucosa: Secondary | ICD-10-CM | POA: Diagnosis not present

## 2014-12-14 DIAGNOSIS — Z931 Gastrostomy status: Secondary | ICD-10-CM | POA: Diagnosis not present

## 2014-12-14 DIAGNOSIS — N189 Chronic kidney disease, unspecified: Secondary | ICD-10-CM | POA: Diagnosis not present

## 2014-12-21 DIAGNOSIS — D696 Thrombocytopenia, unspecified: Secondary | ICD-10-CM | POA: Diagnosis not present

## 2014-12-21 DIAGNOSIS — B370 Candidal stomatitis: Secondary | ICD-10-CM | POA: Diagnosis not present

## 2014-12-21 DIAGNOSIS — E46 Unspecified protein-calorie malnutrition: Secondary | ICD-10-CM | POA: Diagnosis not present

## 2014-12-21 DIAGNOSIS — E162 Hypoglycemia, unspecified: Secondary | ICD-10-CM | POA: Diagnosis not present

## 2014-12-21 DIAGNOSIS — R1310 Dysphagia, unspecified: Secondary | ICD-10-CM | POA: Diagnosis not present

## 2014-12-21 DIAGNOSIS — D509 Iron deficiency anemia, unspecified: Secondary | ICD-10-CM | POA: Diagnosis not present

## 2014-12-21 DIAGNOSIS — N189 Chronic kidney disease, unspecified: Secondary | ICD-10-CM | POA: Diagnosis not present

## 2014-12-24 DIAGNOSIS — Z8701 Personal history of pneumonia (recurrent): Secondary | ICD-10-CM | POA: Diagnosis not present

## 2014-12-24 DIAGNOSIS — T17908A Unspecified foreign body in respiratory tract, part unspecified causing other injury, initial encounter: Secondary | ICD-10-CM | POA: Diagnosis not present

## 2014-12-24 DIAGNOSIS — F17210 Nicotine dependence, cigarettes, uncomplicated: Secondary | ICD-10-CM | POA: Diagnosis not present

## 2014-12-24 DIAGNOSIS — F209 Schizophrenia, unspecified: Secondary | ICD-10-CM | POA: Diagnosis not present

## 2014-12-24 DIAGNOSIS — Z931 Gastrostomy status: Secondary | ICD-10-CM | POA: Diagnosis not present

## 2014-12-24 DIAGNOSIS — E785 Hyperlipidemia, unspecified: Secondary | ICD-10-CM | POA: Diagnosis not present

## 2014-12-24 DIAGNOSIS — R1312 Dysphagia, oropharyngeal phase: Secondary | ICD-10-CM | POA: Diagnosis not present

## 2014-12-24 DIAGNOSIS — E039 Hypothyroidism, unspecified: Secondary | ICD-10-CM | POA: Diagnosis not present

## 2014-12-28 DIAGNOSIS — Z79899 Other long term (current) drug therapy: Secondary | ICD-10-CM | POA: Diagnosis not present

## 2015-01-13 DIAGNOSIS — F209 Schizophrenia, unspecified: Secondary | ICD-10-CM | POA: Diagnosis not present

## 2015-01-25 ENCOUNTER — Encounter: Payer: Self-pay | Admitting: Diagnostic Neuroimaging

## 2015-01-25 ENCOUNTER — Ambulatory Visit (INDEPENDENT_AMBULATORY_CARE_PROVIDER_SITE_OTHER): Payer: Medicare Other | Admitting: Diagnostic Neuroimaging

## 2015-01-25 VITALS — Wt 107.4 lb

## 2015-01-25 DIAGNOSIS — G2119 Other drug induced secondary parkinsonism: Secondary | ICD-10-CM

## 2015-01-25 DIAGNOSIS — G40909 Epilepsy, unspecified, not intractable, without status epilepticus: Secondary | ICD-10-CM

## 2015-01-25 NOTE — Progress Notes (Signed)
[facility]  PATIENT: Steven David DOB: 18-Feb-1955  REFERRING CLINICIAN:   HISTORY FROM: chart review and patient REASON FOR VISIT: follow up    HISTORICAL  CHIEF COMPLAINT:  Chief Complaint  Patient presents with  . Seizures    rm 6, resides at [facility]  . Follow-up    4 month    HISTORY OF PRESENT ILLNESS:   UPDATE 01/25/15: Since last visit, was in hospital for aspiration pneumonia, sepsis, in Nov 2016. Also since last visit, pt now on LEV 250mg  daily + increase VPA of 625mg  BID. No reported seizures.  PRIOR HPI (09/25/14): 60 year old male here for follow-up of seizure. Patient has history of schizophrenia, hypothyroidism, previously living in a group home, then admitted to the hospital for incarcerated inguinal hernia status post sigmoid colectomy, sigmoid colostomy, repair of strangulated hernia. Patient had witnessed seizure postoperatively. Patient was started on Keppra and then transitioned to Depakote. Since discharge no further seizures.   REVIEW OF SYSTEMS: Full 14 system review of systems performed and notable only for as per history of present illness.  ALLERGIES: No Known Allergies  HOME MEDICATIONS: Outpatient Prescriptions Prior to Visit  Medication Sig Dispense Refill  . cholecalciferol (VITAMIN D) 1000 UNITS tablet Take 1,000 Units by mouth daily.    . divalproex (DEPAKOTE SPRINKLE) 125 MG capsule Take 5 capsules (625 mg total) by mouth every 12 (twelve) hours.    . ferrous sulfate 325 (65 FE) MG tablet Take 325 mg by mouth daily with breakfast.    . furosemide (LASIX) 20 MG tablet Take 20 mg by mouth. 09/25/14 20 mg PO two times a day for SOB, [facility]    . haloperidol (HALDOL) 10 MG tablet Place 1 tablet (10 mg total) into feeding tube every evening.    . haloperidol decanoate (HALDOL DECANOATE) 100 MG/ML injection Inject 100 mg into the muscle every 28 (twenty-eight) days. Administered by [facility]    . levETIRAcetam  (KEPPRA) 250 MG tablet Take 250 mg by mouth daily.    Marland Kitchen levothyroxine (SYNTHROID, LEVOTHROID) 100 MCG tablet Place 1 tablet (100 mcg total) into feeding tube daily before breakfast.    . loratadine (CLARITIN) 10 MG tablet Place 1 tablet (10 mg total) into feeding tube daily.    Marland Kitchen lovastatin (MEVACOR) 20 MG tablet Place 1 tablet (20 mg total) into feeding tube daily.    . Nutritional Supplements (FEEDING SUPPLEMENT, JEVITY 1.5 CAL/FIBER,) LIQD Place 1,000 mLs into feeding tube continuous. Initiate nocturnal feedings of Jevity 1.5 @ 100 ml/hr via PEG over 14 hour period    . oxybutynin (DITROPAN) 5 MG tablet Place 1 tablet (5 mg total) into feeding tube 2 (two) times daily. 60 tablet 0  . polyethylene glycol powder (GLYCOLAX/MIRALAX) powder Place 17 g into feeding tube daily as needed. Dissolved in 8 ounces of water/juice daily 255 g 0  . vitamin C (ASCORBIC ACID) 500 MG tablet Take 500 mg by mouth 2 (two) times daily.    . piperacillin-tazobactam (ZOSYN) 3.375 GM/50ML IVPB Inject 50 mLs (3.375 g total) into the vein every 8 (eight) hours. 50 mL   . sodium chloride 0.9 % injection 10-40 mLs by Intracatheter route as needed (flush). 5 mL    No facility-administered medications prior to visit.    PAST MEDICAL HISTORY: Past Medical History  Diagnosis Date  . Schizophrenia ([place])   . Hernia   . Thyroid disease   . High cholesterol   . HOH (hard of hearing)   .  Dysphagia   . Cognitive communication deficit   . Seizures ([place])   . Drug-induced Parkinsonism ([place])   . Malnutrition ([place])   . TIA (transient ischemic attack)     PAST SURGICAL HISTORY: Past Surgical History  Procedure Laterality Date  . Leg surgery    . Testicle surgery    . Laparotomy N/A 09/01/2014    Procedure: EXPLORATORY LAPAROTOMY, SIGMOID COLECTOMY, SIGMOID COLOSTOMY, PRIMARY REPAIR OF STRANGULATED INGUINAL HERNIA.;  Surgeon: Rolm Bookbinder, MD;  Location: [facility];  Service: General;  Laterality: N/A;  .  Esophagogastroduodenoscopy (egd) with propofol N/A 09/08/2014    Procedure: ESOPHAGOGASTRODUODENOSCOPY (EGD) WITH PROPOFOL;  Surgeon: Georganna Skeans, MD;  Location: [place];  Service: Endoscopy;  Laterality: N/A;  . Peg placement N/A 09/08/2014    Procedure: PERCUTANEOUS ENDOSCOPIC GASTROSTOMY (PEG) PLACEMENT;  Surgeon: Georganna Skeans, MD;  Location: [place];  Service: Endoscopy;  Laterality: N/A;    FAMILY HISTORY: Family History  Problem Relation Age of Onset  . Family history unknown: Yes    SOCIAL HISTORY:  Social History   Social History  . Marital Status: Single    Spouse Name: N/A  . Number of Children: N/A  . Years of Education: N/A   Occupational History  . Not on file.   Social History Main Topics  . Smoking status: Former Research scientist (life sciences)  . Smokeless tobacco: Not on file  . Alcohol Use: No  . Drug Use: No  . Sexual Activity: No   Other Topics Concern  . Not on file   Social History Narrative     PHYSICAL EXAM  GENERAL EXAM/CONSTITUTIONAL: Vitals:  Filed Vitals:   01/25/15 1509  Weight: 107 lb 6.4 oz (48.716 kg)   Body mass index is 14.56 kg/(m^2). No exam data present  Patient is in no distress; well developed, nourished and groomed; neck is supple  CARDIOVASCULAR:  Examination of carotid arteries is normal; no carotid bruits  Regular rate and rhythm, no murmurs  Examination of peripheral vascular system by observation and palpation is normal  EYES:  Ophthalmoscopic exam of optic discs and posterior segments is normal; no papilledema or hemorrhages  MUSCULOSKELETAL:  Gait, strength, tone, movements noted in Neurologic exam below  NEUROLOGIC: MENTAL STATUS:  No flowsheet data found.  awake, alert, oriented to person, place and time  [facility] memory   DECR attention and concentration  DECR FLUENCY; comprehension intact, naming intact,   DECR fund of knowledge   CRANIAL NERVE:   2nd, 3rd, 4th, 6th - pupils equal and reactive to  light, visual fields full to confrontation, extraocular muscles --> EXOTROPIA, no nystagmus  5th - facial sensation symmetric  7th - facial strength symmetric  8th - hearing intact  9th - palate elevates symmetrically, uvula midline  11th - shoulder shrug symmetric  12th - tongue protrusion midline  SEVERE DYSARTHIRA  MOTOR:   RESTING TREMOR IN HANDS AND MOUTH; NO BRADYKINESIA; NO COGWHEELING; normal bulk and tone, full strength in the BUE, BLE  SENSORY:   normal and symmetric to light touch, temperature, vibration   COORDINATION:   finger-nose-finger, fine finger movements, SLOW  REFLEXES:   deep tendon reflexes present and symmetric  GAIT/STATION:   IN WHEELCHAIR; STANDS SLOWLY; STOOPED POSTURE; SMALL STEPS; HAND TREMOR WITH WALKING; REDUCED ARM SWING    DIAGNOSTIC DATA (LABS, IMAGING, TESTING) - I reviewed patient records, labs, notes, testing and imaging myself where available.  Lab Results  Component Value Date   WBC 11.2* 11/19/2014   HGB 10.1* 11/19/2014  HCT 32.9* 11/19/2014   MCV 86.1 11/19/2014   PLT 100* 11/19/2014      Component Value Date/Time   NA 141 11/19/2014 0616   K 4.1 11/19/2014 0616   CL 103 11/19/2014 0616   CO2 29 11/19/2014 0616   GLUCOSE 109* 11/19/2014 0616   BUN 22* 11/19/2014 0616   CREATININE 1.06 11/19/2014 0616   CALCIUM 9.2 11/19/2014 0616   PROT 8.5* 11/19/2014 0051   ALBUMIN 3.6 11/19/2014 0051   AST 16 11/19/2014 0051   ALT 9* 11/19/2014 0051   ALKPHOS 65 11/19/2014 0051   BILITOT 0.4 11/19/2014 0051   GFRNONAA >60 11/19/2014 0616   GFRAA >60 11/19/2014 0616   Lab Results  Component Value Date   CHOL 95 04/12/2013   HDL 37* 04/12/2013   LDLCALC 48 04/12/2013   TRIG 40 09/01/2014   CHOLHDL 2.6 04/12/2013   Lab Results  Component Value Date   HGBA1C 5.6 04/12/2013   No results found for: [id number] Lab Results  Component Value Date   TSH 0.695 11/19/2014   Lab Results  Component Value Date    VALPROATE 14* 11/19/2014    04/11/13 MRI brain [I reviewed images myself and agree with interpretation. -VRP]  - Premature atrophy, small vessel disease, and evidence for remote cerebral infarction. No acute intracranial findings.  11/19/14 CT head  1. No acute intracranial process. 2. Stable atrophy with chronic small vessel ischemic disease. 3. 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  09/02/14 EEG  - This is a normal EEG recording during wakefulness and during sleep. No evidence of an epileptic disorder was demonstrated. However, a normal EEG recording in and of itself does not rule out seizure disorder.     ASSESSMENT AND PLAN  60 y.o. year old male here with history of schizophrenia, with recent hospitalization for strangulated hernia and gastric volvulus, status post colectomy, with postoperative seizure in Sept 2016. Also with signs and symptoms of drug induced parkinsonism, likely related to current haldol usage.   Dx:   Seizure disorder ([place])  Drug-induced Parkinsonism ([place])    PLAN: - continue depakote 625mg  BID + levetiracetam 250mg  daily for seizure mgmt  Return in about 6 months (around 07/25/2015).    Penni Bombard, MD [id number], [id number] PM Certified in Neurology, Neurophysiology and Neuroimaging  [facility] [address] [place], [place] [address] [phone]

## 2015-01-25 NOTE — Patient Instructions (Signed)
-  continue current medications

## 2015-02-15 DIAGNOSIS — N390 Urinary tract infection, site not specified: Secondary | ICD-10-CM | POA: Diagnosis not present

## 2015-02-15 DIAGNOSIS — R569 Unspecified convulsions: Secondary | ICD-10-CM | POA: Diagnosis not present

## 2015-02-15 DIAGNOSIS — R918 Other nonspecific abnormal finding of lung field: Secondary | ICD-10-CM | POA: Diagnosis not present

## 2015-02-15 DIAGNOSIS — I4891 Unspecified atrial fibrillation: Secondary | ICD-10-CM | POA: Diagnosis not present

## 2015-02-15 DIAGNOSIS — I10 Essential (primary) hypertension: Secondary | ICD-10-CM | POA: Diagnosis not present

## 2015-02-15 DIAGNOSIS — Z79899 Other long term (current) drug therapy: Secondary | ICD-10-CM | POA: Diagnosis not present

## 2015-05-03 DIAGNOSIS — R569 Unspecified convulsions: Secondary | ICD-10-CM | POA: Diagnosis not present

## 2015-05-03 DIAGNOSIS — I6350 Cerebral infarction due to unspecified occlusion or stenosis of unspecified cerebral artery: Secondary | ICD-10-CM | POA: Diagnosis not present

## 2015-05-03 DIAGNOSIS — E46 Unspecified protein-calorie malnutrition: Secondary | ICD-10-CM | POA: Diagnosis not present

## 2015-05-03 DIAGNOSIS — R1310 Dysphagia, unspecified: Secondary | ICD-10-CM | POA: Diagnosis not present

## 2015-05-05 DIAGNOSIS — F209 Schizophrenia, unspecified: Secondary | ICD-10-CM | POA: Diagnosis not present

## 2015-06-28 ENCOUNTER — Other Ambulatory Visit: Payer: Self-pay

## 2015-06-28 ENCOUNTER — Telehealth: Payer: Self-pay

## 2015-06-28 DIAGNOSIS — Z431 Encounter for attention to gastrostomy: Secondary | ICD-10-CM

## 2015-06-28 DIAGNOSIS — E46 Unspecified protein-calorie malnutrition: Secondary | ICD-10-CM | POA: Diagnosis not present

## 2015-06-28 DIAGNOSIS — R569 Unspecified convulsions: Secondary | ICD-10-CM | POA: Diagnosis not present

## 2015-06-28 DIAGNOSIS — R1310 Dysphagia, unspecified: Secondary | ICD-10-CM | POA: Diagnosis not present

## 2015-06-28 NOTE — Telephone Encounter (Signed)
Orders in for Wednesday and April at [facility] is aware of appointment date and time.

## 2015-06-28 NOTE — Telephone Encounter (Signed)
Pt is at [facility]. His PEG came out yesterday and a Foley is in place to hold open. Please advise

## 2015-06-28 NOTE — Telephone Encounter (Signed)
He can be brought over to [facility] immediately for PEG change otherwise I'll do it on Wednesday

## 2015-06-29 NOTE — Telephone Encounter (Signed)
REVIEWED-NO ADDITIONAL RECOMMENDATIONS. 

## 2015-06-30 ENCOUNTER — Encounter (HOSPITAL_COMMUNITY)
Admission: RE | Disposition: A | Discharge status: Home / Self Care | Payer: Self-pay | Source: Ambulatory Visit | Attending: Internal Medicine

## 2015-06-30 ENCOUNTER — Ambulatory Visit (HOSPITAL_COMMUNITY)
Admission: RE | Admit: 2015-06-30 | Discharge: 2015-06-30 | Disposition: A | Discharge status: Home / Self Care | Payer: Medicare Other | Source: Ambulatory Visit | Attending: Internal Medicine | Admitting: Internal Medicine

## 2015-06-30 ENCOUNTER — Encounter (HOSPITAL_COMMUNITY): Payer: Self-pay

## 2015-06-30 DIAGNOSIS — K9423 Gastrostomy malfunction: Secondary | ICD-10-CM | POA: Insufficient documentation

## 2015-06-30 DIAGNOSIS — Z431 Encounter for attention to gastrostomy: Secondary | ICD-10-CM | POA: Diagnosis not present

## 2015-06-30 DIAGNOSIS — Z933 Colostomy status: Secondary | ICD-10-CM | POA: Insufficient documentation

## 2015-06-30 HISTORY — PX: PEG PLACEMENT: SHX5437

## 2015-06-30 SURGERY — REPLACEMENT, PEG TUBE, WITHOUT ENDOSCOPY
Anesthesia: LOCAL

## 2015-06-30 NOTE — Discharge Instructions (Signed)
Resume usual use and care of PEG tube.

## 2015-06-30 NOTE — Procedures (Signed)
Patient here for PEG tube change. Apparently peg tube fell out earlier in the week and a Foley was placed to salvage the ostomy. He comes to short stay to have his PEG replaced.  It is not known where his original PEG tube was placed. I found him to have a PEG tube with a balloon type internal bumper with no external bumper protruding from the ostomy.  Ostomy looked good I took the balloon down and  easily removed intact PEG tube. Please note the gastrostomy tube was just superior to his colostomy  Utilizing a [employer] [place] internal balloon type bumper angulated PEG tube, I placed it through the ostomy and inflated the balloon to 6 mL sterile water. There was immediate flow of gastric contents with this maneuver. I anchored the external bumper the 2 cm mark. Patient instructed to resume the usual care and use of his new PEG tube. Patient tolerated procedure very well.

## 2015-07-05 ENCOUNTER — Encounter (HOSPITAL_COMMUNITY): Payer: Self-pay | Admitting: Internal Medicine

## 2015-07-06 DIAGNOSIS — M79675 Pain in left toe(s): Secondary | ICD-10-CM | POA: Diagnosis not present

## 2015-07-06 DIAGNOSIS — B351 Tinea unguium: Secondary | ICD-10-CM | POA: Diagnosis not present

## 2015-07-06 DIAGNOSIS — M79674 Pain in right toe(s): Secondary | ICD-10-CM | POA: Diagnosis not present

## 2015-07-26 ENCOUNTER — Ambulatory Visit: Payer: Medicare Other | Admitting: Diagnostic Neuroimaging

## 2015-08-05 DIAGNOSIS — R1310 Dysphagia, unspecified: Secondary | ICD-10-CM | POA: Diagnosis not present

## 2015-08-05 DIAGNOSIS — R569 Unspecified convulsions: Secondary | ICD-10-CM | POA: Diagnosis not present

## 2015-08-05 DIAGNOSIS — F209 Schizophrenia, unspecified: Secondary | ICD-10-CM | POA: Diagnosis not present

## 2015-08-05 DIAGNOSIS — Z933 Colostomy status: Secondary | ICD-10-CM | POA: Diagnosis not present

## 2015-08-18 ENCOUNTER — Encounter: Payer: Self-pay | Admitting: Diagnostic Neuroimaging

## 2015-08-18 ENCOUNTER — Ambulatory Visit (INDEPENDENT_AMBULATORY_CARE_PROVIDER_SITE_OTHER): Payer: Medicare Other | Admitting: Diagnostic Neuroimaging

## 2015-08-18 VITALS — BP 83/57 | HR 89 | Wt 130.0 lb

## 2015-08-18 DIAGNOSIS — G2119 Other drug induced secondary parkinsonism: Secondary | ICD-10-CM

## 2015-08-18 DIAGNOSIS — G40909 Epilepsy, unspecified, not intractable, without status epilepticus: Secondary | ICD-10-CM

## 2015-08-18 NOTE — Patient Instructions (Signed)
-   continue current plan

## 2015-08-18 NOTE — Progress Notes (Signed)
[facility]  PATIENT: Steven David DOB: [birth year]  REFERRING CLINICIAN:   HISTORY FROM: chart review and patient REASON FOR VISIT: follow up    HISTORICAL  CHIEF COMPLAINT:  Chief Complaint  Patient presents with  . 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 Follow-up    6 month    HISTORY OF PRESENT ILLNESS:   UPDATE 08/18/15: Since last visit, no seizures. Has had some weight loss. No other new issues.   UPDATE 01/25/15: Since last visit, was in hospital for aspiration pneumonia, sepsis, in Nov 2016. Also since last visit, pt now on LEV 250mg  daily + increase VPA of 625mg  BID. No reported seizures.  PRIOR HPI (09/25/14): 60 year old male here for follow-up of seizure. Patient has history of schizophrenia, hypothyroidism, previously living in a group home, then admitted to the hospital for incarcerated inguinal hernia status post sigmoid colectomy, sigmoid colostomy, repair of strangulated hernia. Patient had witnessed seizure postoperatively. Patient was started on Keppra and then transitioned to Depakote. Since discharge no further seizures.   REVIEW OF SYSTEMS: Full 14 system review of systems performed and notable only for as per history of present illness.  ALLERGIES: No Known Allergies  HOME MEDICATIONS: Outpatient Medications Prior to Visit  Medication Sig Dispense Refill  . cholecalciferol (VITAMIN D) 1000 UNITS tablet Take 1,000 Units by mouth daily.    . divalproex (DEPAKOTE SPRINKLE) 125 MG capsule Take 5 capsules (625 mg total) by mouth every 12 (twelve) hours.    . furosemide (LASIX) 20 MG tablet Take 20 mg by mouth. 09/25/14 20 mg PO two times a day for SOB, [facility]    . haloperidol (HALDOL) 10 MG tablet Place 1 tablet (10 mg total) into feeding tube every evening.    . haloperidol decanoate (HALDOL DECANOATE) 100 MG/ML injection Inject 100 mg into the muscle every 28  (twenty-eight) days. Administered by [facility]    . levETIRAcetam (KEPPRA) 250 MG tablet Take 250 mg by mouth daily.    Marland Kitchen levothyroxine (SYNTHROID, LEVOTHROID) 100 MCG tablet Place 1 tablet (100 mcg total) into feeding tube daily before breakfast.    . loratadine (CLARITIN) 10 MG tablet Place 1 tablet (10 mg total) into feeding tube daily.    Marland Kitchen lovastatin (MEVACOR) 20 MG tablet Place 1 tablet (20 mg total) into feeding tube daily.    . Nutritional Supplements (FEEDING SUPPLEMENT, JEVITY 1.5 CAL/FIBER,) LIQD Place 1,000 mLs into feeding tube continuous. Initiate nocturnal feedings of Jevity 1.5 @ 100 ml/hr via PEG over 14 hour period    . oxybutynin (DITROPAN) 5 MG tablet Place 1 tablet (5 mg total) into feeding tube 2 (two) times daily. 60 tablet 0  . piperacillin-tazobactam (ZOSYN) 3.375 GM/50ML IVPB Inject 50 mLs (3.375 g total) into the vein every 8 (eight) hours. 50 mL   . polyethylene glycol powder (GLYCOLAX/MIRALAX) powder Place 17 g into feeding tube daily as needed. Dissolved in 8 ounces of water/juice daily 255 g 0  . sodium chloride 0.9 % injection 10-40 mLs by Intracatheter route as needed (flush). 5 mL   . vitamin C (ASCORBIC ACID) 500 MG tablet Take 500 mg by mouth 2 (two) times daily.    . ferrous sulfate 325 (65 FE) MG tablet Take 325 mg by mouth daily with breakfast.     No facility-administered medications prior to visit.     PAST MEDICAL HISTORY: Past Medical History:  Diagnosis Date  . Cognitive communication deficit   . Drug-induced Parkinsonism ([place])   . Dysphagia   . Hernia   . High cholesterol   . HOH (hard of hearing)   . Malnutrition ([place])   . Schizophrenia ([place])   . Seizures ([place])   . Thyroid disease   . TIA (transient ischemic attack)     PAST SURGICAL HISTORY: Past Surgical History:  Procedure Laterality Date  . ESOPHAGOGASTRODUODENOSCOPY (EGD) WITH PROPOFOL N/A 09/08/2014   Procedure: ESOPHAGOGASTRODUODENOSCOPY (EGD) WITH PROPOFOL;  Surgeon: Georganna Skeans, MD;  Location: [place];  Service: Endoscopy;  Laterality: N/A;  . LAPAROTOMY N/A 09/01/2014   Procedure: EXPLORATORY LAPAROTOMY, SIGMOID COLECTOMY, SIGMOID COLOSTOMY, PRIMARY REPAIR OF STRANGULATED INGUINAL HERNIA.;  Surgeon: Rolm Bookbinder, MD;  Location: [place];  Service: General;  Laterality: N/A;  . LEG SURGERY    . PEG PLACEMENT N/A 09/08/2014   Procedure: PERCUTANEOUS ENDOSCOPIC GASTROSTOMY (PEG) PLACEMENT;  Surgeon: Georganna Skeans, MD;  Location: [place];  Service: Endoscopy;  Laterality: N/A;  . PEG PLACEMENT N/A 06/30/2015   Procedure: PERCUTANEOUS ENDOSCOPIC GASTROSTOMY (PEG) REPLACEMENT;  Surgeon: Daneil Dolin, MD;  [facility];  Service: Endoscopy;  Laterality: N/A;  1100  . TESTICLE SURGERY      FAMILY HISTORY: Family History  Problem Relation Age of Onset  . Family history unknown: Yes    SOCIAL HISTORY:  Social History   Social History  . Marital status: Single    Spouse name: N/A  . Number of children: N/A  . Years of education: N/A   Occupational History  . Not on file.   Social History Main Topics  . Smoking status: Former Research scientist (life sciences)  . Smokeless tobacco: Not on file  . Alcohol use No  . Drug use: No  . Sexual activity: No   Other Topics Concern  . Not on file   Social History Narrative  . No narrative on file     PHYSICAL EXAM  GENERAL EXAM/CONSTITUTIONAL: Vitals:  Vitals:   08/18/15 1059  BP: (!) 83/57  Pulse: 89  Weight: 130 lb (59 kg)   Wt Readings from Last 3 Encounters:  08/18/15 130 lb (59 kg)  01/25/15 107 lb 6.4 oz (48.7 kg)  11/19/14 120 lb 13 oz (54.8 kg)   Body mass index is 17.63 kg/m. No exam data present  Patient is in no distress; well developed, nourished and groomed; neck is supple  CARDIOVASCULAR:  Examination of carotid arteries is normal; no carotid bruits  Regular rate and rhythm, no murmurs  Examination of peripheral vascular system by observation and palpation is  normal  EYES:  Ophthalmoscopic exam of optic discs and posterior segments is normal; no papilledema or hemorrhages  MUSCULOSKELETAL:  Gait, strength, tone, movements noted in Neurologic exam below  NEUROLOGIC: MENTAL STATUS:  No flowsheet data found.  awake, alert, oriented to person, place and time  [facility] memory   DECR attention and concentration  DECR FLUENCY; comprehension intact, naming intact,   DECR fund of knowledge   CRANIAL NERVE:   2nd, 3rd, 4th, 6th - pupils equal and reactive to light, visual fields full to confrontation, extraocular muscles --> EXOTROPIA, no nystagmus  5th - facial sensation symmetric  7th - facial strength symmetric  8th - hearing intact  9th - palate elevates symmetrically, uvula midline  11th - shoulder shrug symmetric  12th - tongue protrusion midline  SEVERE DYSARTHIRA  MOTOR:   RESTING TREMOR IN HANDS AND MOUTH; NO BRADYKINESIA; NO COGWHEELING;  normal bulk and tone, full strength in the BUE, BLE  SENSORY:   normal and symmetric to light touch, temperature, vibration   COORDINATION:   finger-nose-finger, fine finger movements, SLOW  REFLEXES:   deep tendon reflexes present and symmetric  GAIT/STATION:   IN WHEELCHAIR; STANDS SLOWLY; STOOPED POSTURE; SMALL STEPS; HAND TREMOR WITH WALKING; REDUCED ARM SWING    DIAGNOSTIC DATA (LABS, IMAGING, TESTING) - I reviewed patient records, labs, notes, testing and imaging myself where available.  Lab Results  Component Value Date   WBC 11.2 (H) 11/19/2014   HGB 10.1 (L) 11/19/2014   HCT 32.9 (L) 11/19/2014   MCV 86.1 11/19/2014   PLT 100 (L) 11/19/2014      Component Value Date/Time   NA 141 11/19/2014 0616   K 4.1 11/19/2014 0616   CL 103 11/19/2014 0616   CO2 29 11/19/2014 0616   GLUCOSE 109 (H) 11/19/2014 0616   BUN 22 (H) 11/19/2014 0616   CREATININE 1.06 11/19/2014 0616   CALCIUM 9.2 11/19/2014 0616   PROT 8.5 (H) 11/19/2014 0051   ALBUMIN 3.6 11/19/2014  0051   AST 16 11/19/2014 0051   ALT 9 (L) 11/19/2014 0051   ALKPHOS 65 11/19/2014 0051   BILITOT 0.4 11/19/2014 0051   GFRNONAA >60 11/19/2014 0616   GFRAA >60 11/19/2014 0616   Lab Results  Component Value Date   CHOL 95 04/12/2013   HDL 37 (L) 04/12/2013   LDLCALC 48 04/12/2013   TRIG 40 09/01/2014   CHOLHDL 2.6 04/12/2013   Lab Results  Component Value Date   HGBA1C 5.6 04/12/2013   No results found for: [id number] Lab Results  Component Value Date   TSH 0.695 11/19/2014   Lab Results  Component Value Date   VALPROATE 14 (L) 11/19/2014    04/11/13 MRI brain [I reviewed images myself and agree with interpretation. -VRP]  - Premature atrophy, small vessel disease, and evidence for remote cerebral infarction. No acute intracranial findings.  11/19/14 CT head  1. No acute intracranial process. 2. Stable atrophy with chronic small vessel ischemic disease. 3. 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  09/02/14 EEG  - This is a normal EEG recording during wakefulness and during sleep. No evidence of an epileptic disorder was demonstrated. However, a normal EEG recording in and of itself does not rule out seizure disorder.     ASSESSMENT AND PLAN  60 y.o. year old male here with history of schizophrenia, with hospitalization for strangulated hernia and gastric volvulus, status post colectomy, with postoperative seizure in Sept 2016. Also with signs and symptoms of drug induced parkinsonism, likely related to current haldol usage.   Dx:   Seizure disorder ([place])  Drug-induced Parkinsonism ([place])    PLAN: - continue depakote 625mg  BID + levetiracetam 250mg  daily for seizure mgmt - monitor tremor; no meds at this time - annual labs per PCP (CBC, CMP, ammonia)  Return in about 1 year (around 08/17/2016).    Penni Bombard, MD [id number], [id number] AM Certified in Neurology, Neurophysiology and Neuroimaging  [facility] [address] [place], [place] [address] [phone]

## 2015-08-25 DIAGNOSIS — F251 Schizoaffective disorder, depressive type: Secondary | ICD-10-CM | POA: Diagnosis not present

## 2015-09-02 DIAGNOSIS — F209 Schizophrenia, unspecified: Secondary | ICD-10-CM | POA: Diagnosis not present

## 2015-09-02 DIAGNOSIS — R569 Unspecified convulsions: Secondary | ICD-10-CM | POA: Diagnosis not present

## 2015-09-02 DIAGNOSIS — Z933 Colostomy status: Secondary | ICD-10-CM | POA: Diagnosis not present

## 2015-09-02 DIAGNOSIS — F17290 Nicotine dependence, other tobacco product, uncomplicated: Secondary | ICD-10-CM | POA: Diagnosis not present

## 2015-09-28 DIAGNOSIS — M79675 Pain in left toe(s): Secondary | ICD-10-CM | POA: Diagnosis not present

## 2015-09-28 DIAGNOSIS — M79674 Pain in right toe(s): Secondary | ICD-10-CM | POA: Diagnosis not present

## 2015-09-28 DIAGNOSIS — B351 Tinea unguium: Secondary | ICD-10-CM | POA: Diagnosis not present

## 2015-09-30 DIAGNOSIS — R05 Cough: Secondary | ICD-10-CM | POA: Diagnosis not present

## 2015-09-30 DIAGNOSIS — R451 Restlessness and agitation: Secondary | ICD-10-CM | POA: Diagnosis not present

## 2015-09-30 DIAGNOSIS — I6350 Cerebral infarction due to unspecified occlusion or stenosis of unspecified cerebral artery: Secondary | ICD-10-CM | POA: Diagnosis not present

## 2015-09-30 DIAGNOSIS — F209 Schizophrenia, unspecified: Secondary | ICD-10-CM | POA: Diagnosis not present

## 2015-10-01 DIAGNOSIS — R4182 Altered mental status, unspecified: Secondary | ICD-10-CM | POA: Diagnosis not present

## 2015-10-02 DIAGNOSIS — D649 Anemia, unspecified: Secondary | ICD-10-CM | POA: Diagnosis not present

## 2015-10-21 DIAGNOSIS — B370 Candidal stomatitis: Secondary | ICD-10-CM | POA: Diagnosis not present

## 2015-10-21 DIAGNOSIS — R569 Unspecified convulsions: Secondary | ICD-10-CM | POA: Diagnosis not present

## 2015-10-21 DIAGNOSIS — J690 Pneumonitis due to inhalation of food and vomit: Secondary | ICD-10-CM | POA: Diagnosis not present

## 2015-10-21 DIAGNOSIS — R1310 Dysphagia, unspecified: Secondary | ICD-10-CM | POA: Diagnosis not present

## 2015-11-15 DIAGNOSIS — R531 Weakness: Secondary | ICD-10-CM | POA: Diagnosis not present

## 2015-11-15 DIAGNOSIS — R1310 Dysphagia, unspecified: Secondary | ICD-10-CM | POA: Diagnosis not present

## 2015-11-15 DIAGNOSIS — R05 Cough: Secondary | ICD-10-CM | POA: Diagnosis not present

## 2015-11-15 DIAGNOSIS — R5381 Other malaise: Secondary | ICD-10-CM | POA: Diagnosis not present

## 2015-11-16 DIAGNOSIS — Z79899 Other long term (current) drug therapy: Secondary | ICD-10-CM | POA: Diagnosis not present

## 2015-11-16 DIAGNOSIS — G40909 Epilepsy, unspecified, not intractable, without status epilepticus: Secondary | ICD-10-CM | POA: Diagnosis not present

## 2015-11-16 DIAGNOSIS — R918 Other nonspecific abnormal finding of lung field: Secondary | ICD-10-CM | POA: Diagnosis not present

## 2015-11-16 DIAGNOSIS — R4182 Altered mental status, unspecified: Secondary | ICD-10-CM | POA: Diagnosis not present

## 2015-11-16 DIAGNOSIS — R69 Illness, unspecified: Secondary | ICD-10-CM | POA: Diagnosis not present

## 2015-11-18 DIAGNOSIS — E860 Dehydration: Secondary | ICD-10-CM | POA: Diagnosis not present

## 2015-11-18 DIAGNOSIS — R05 Cough: Secondary | ICD-10-CM | POA: Diagnosis not present

## 2015-11-18 DIAGNOSIS — R1310 Dysphagia, unspecified: Secondary | ICD-10-CM | POA: Diagnosis not present

## 2015-11-18 DIAGNOSIS — J690 Pneumonitis due to inhalation of food and vomit: Secondary | ICD-10-CM | POA: Diagnosis not present

## 2015-11-22 DIAGNOSIS — I509 Heart failure, unspecified: Secondary | ICD-10-CM | POA: Diagnosis not present

## 2015-11-22 DIAGNOSIS — J690 Pneumonitis due to inhalation of food and vomit: Secondary | ICD-10-CM | POA: Diagnosis not present

## 2015-11-22 DIAGNOSIS — R1310 Dysphagia, unspecified: Secondary | ICD-10-CM | POA: Diagnosis not present

## 2015-11-22 DIAGNOSIS — B370 Candidal stomatitis: Secondary | ICD-10-CM | POA: Diagnosis not present

## 2015-11-22 DIAGNOSIS — I5043 Acute on chronic combined systolic (congestive) and diastolic (congestive) heart failure: Secondary | ICD-10-CM | POA: Diagnosis not present

## 2015-11-22 DIAGNOSIS — D690 Allergic purpura: Secondary | ICD-10-CM | POA: Diagnosis not present

## 2015-12-10 ENCOUNTER — Emergency Department (HOSPITAL_COMMUNITY): Payer: Medicare Other

## 2015-12-10 ENCOUNTER — Emergency Department (HOSPITAL_COMMUNITY)
Admission: EM | Admit: 2015-12-10 | Discharge: 2015-12-10 | Disposition: A | Discharge status: Home / Self Care | Payer: Medicare Other | Attending: Emergency Medicine | Admitting: Emergency Medicine

## 2015-12-10 ENCOUNTER — Encounter (HOSPITAL_COMMUNITY): Payer: Self-pay

## 2015-12-10 DIAGNOSIS — Z87891 Personal history of nicotine dependence: Secondary | ICD-10-CM | POA: Insufficient documentation

## 2015-12-10 DIAGNOSIS — Z79899 Other long term (current) drug therapy: Secondary | ICD-10-CM | POA: Diagnosis not present

## 2015-12-10 DIAGNOSIS — G529 Cranial nerve disorder, unspecified: Secondary | ICD-10-CM | POA: Diagnosis not present

## 2015-12-10 DIAGNOSIS — E039 Hypothyroidism, unspecified: Secondary | ICD-10-CM | POA: Diagnosis not present

## 2015-12-10 DIAGNOSIS — R51 Headache: Secondary | ICD-10-CM | POA: Insufficient documentation

## 2015-12-10 DIAGNOSIS — F209 Schizophrenia, unspecified: Secondary | ICD-10-CM | POA: Diagnosis not present

## 2015-12-10 DIAGNOSIS — R4781 Slurred speech: Secondary | ICD-10-CM | POA: Diagnosis not present

## 2015-12-10 DIAGNOSIS — R251 Tremor, unspecified: Secondary | ICD-10-CM | POA: Diagnosis not present

## 2015-12-10 DIAGNOSIS — R279 Unspecified lack of coordination: Secondary | ICD-10-CM | POA: Diagnosis not present

## 2015-12-10 DIAGNOSIS — R100 Acute abdomen: Secondary | ICD-10-CM | POA: Diagnosis not present

## 2015-12-10 DIAGNOSIS — R519 Headache, unspecified: Secondary | ICD-10-CM

## 2015-12-10 DIAGNOSIS — R109 Unspecified abdominal pain: Secondary | ICD-10-CM

## 2015-12-10 DIAGNOSIS — R1084 Generalized abdominal pain: Secondary | ICD-10-CM | POA: Insufficient documentation

## 2015-12-10 DIAGNOSIS — I6789 Other cerebrovascular disease: Secondary | ICD-10-CM | POA: Diagnosis not present

## 2015-12-10 DIAGNOSIS — Z743 Need for continuous supervision: Secondary | ICD-10-CM | POA: Diagnosis not present

## 2015-12-10 LAB — CBC WITH DIFFERENTIAL/PLATELET
BASOS ABS: 0 10*3/uL (ref 0.0–0.1)
Basophils Relative: 1 %
EOS ABS: 0.2 10*3/uL (ref 0.0–0.7)
Eosinophils Relative: 4 %
HEMATOCRIT: 35.2 % — AB (ref 39.0–52.0)
Hemoglobin: 11.3 g/dL — ABNORMAL LOW (ref 13.0–17.0)
LYMPHS ABS: 2.2 10*3/uL (ref 0.7–4.0)
Lymphocytes Relative: 44 %
MCH: 30.1 pg (ref 26.0–34.0)
MCHC: 32.1 g/dL (ref 30.0–36.0)
MCV: 93.6 fL (ref 78.0–100.0)
Monocytes Absolute: 0.5 10*3/uL (ref 0.1–1.0)
Monocytes Relative: 9 %
NEUTROS PCT: 42 %
Neutro Abs: 2.1 10*3/uL (ref 1.7–7.7)
PLATELETS: 53 10*3/uL — AB (ref 150–400)
RBC: 3.76 MIL/uL — AB (ref 4.22–5.81)
RDW: 15 % (ref 11.5–15.5)
Smear Review: DECREASED
WBC: 4.9 10*3/uL (ref 4.0–10.5)

## 2015-12-10 LAB — URINALYSIS, ROUTINE W REFLEX MICROSCOPIC
BILIRUBIN URINE: NEGATIVE
Glucose, UA: NEGATIVE mg/dL
Hgb urine dipstick: NEGATIVE
KETONES UR: NEGATIVE mg/dL
LEUKOCYTES UA: NEGATIVE
NITRITE: NEGATIVE
PH: 7 (ref 5.0–8.0)
PROTEIN: NEGATIVE mg/dL
Specific Gravity, Urine: <1.005 — ABNORMAL LOW (ref 1.005–1.030)

## 2015-12-10 LAB — COMPREHENSIVE METABOLIC PANEL
ALT: 17 U/L (ref 17–63)
AST: 23 U/L (ref 15–41)
Albumin: 3.3 g/dL — ABNORMAL LOW (ref 3.5–5.0)
Alkaline Phosphatase: 78 U/L (ref 38–126)
Anion gap: 7 (ref 5–15)
BILIRUBIN TOTAL: 0.4 mg/dL (ref 0.3–1.2)
BUN: 23 mg/dL — ABNORMAL HIGH (ref 6–20)
CHLORIDE: 101 mmol/L (ref 101–111)
CO2: 31 mmol/L (ref 22–32)
CREATININE: 0.73 mg/dL (ref 0.61–1.24)
Calcium: 8.9 mg/dL (ref 8.9–10.3)
Glucose, Bld: 79 mg/dL (ref 65–99)
POTASSIUM: 3.9 mmol/L (ref 3.5–5.1)
Sodium: 139 mmol/L (ref 135–145)
TOTAL PROTEIN: 6.9 g/dL (ref 6.5–8.1)

## 2015-12-10 LAB — LIPASE, BLOOD: LIPASE: 21 U/L (ref 11–51)

## 2015-12-10 MED ORDER — ONDANSETRON HCL 4 MG/2ML IJ SOLN
4.0000 mg | Freq: Once | INTRAMUSCULAR | Status: AC
  Administered 2015-12-10: 4 mg via INTRAVENOUS
  Filled 2015-12-10: qty 2

## 2015-12-10 MED ORDER — SODIUM CHLORIDE 0.9 % IV BOLUS (SEPSIS)
1000.0000 mL | Freq: Once | INTRAVENOUS | Status: AC
  Administered 2015-12-10: 1000 mL via INTRAVENOUS

## 2015-12-10 NOTE — Discharge Instructions (Signed)
Steven David stopped complaining of a headache and abdominal pain during his ED visit. His laboratory testing and x-rays were unrevealing. At time of discharge he stated he was itching and he felt dizzy. Patient tried to trick the ED staff in that he was allowed to eat food.

## 2015-12-10 NOTE — ED Provider Notes (Signed)
[facility] DEPT Provider Note   CSN: [id number] Arrival date & time: 12/10/15  0035  Time seen 01:20 AM   History   Chief Complaint Chief Complaint  Patient presents with  . Abdominal Pain   Level V caveat for difficulty understanding his speech  HPI Steven David is a 60 y.o. male.  HPI  patient lives in a nursing facility and has chronic schizophrenia. His speech is very difficult to understand. However he states he doesn't feel good tonight. He states his head hurts and puts his hand on his forehead and his back of his head. He also states his stomach hurts. I think he says he is nauseated however I cannot tell if he says he's been vomiting. Patient says a lot of other things that I am not able to understand what he's saying after asking several ways in trying to ask him yes or no questions but he does not answer with yes or no responses.  PCP Dr. Vernell Morgans  Nursing facility paperwork states patient is DO NOT RESUSCITATE and has a most form however they did not send it with him  Past Medical History:  Diagnosis Date  . Cognitive communication deficit   . Drug-induced Parkinsonism ([place])   . Dysphagia   . Hernia   . High cholesterol   . HOH (hard of hearing)   . Malnutrition ([place])   . Schizophrenia ([place])   . Seizures ([place])   . Thyroid disease   . TIA (transient ischemic attack)     Patient Active Problem List   Diagnosis Date Noted  . Malfunction of percutaneous endoscopic gastrostomy (PEG) tube ([place])   . Malnutrition of moderate degree 11/20/2014  . HCAP (healthcare-associated pneumonia) 11/19/2014  . Postoperative anemia due to acute blood loss 09/07/2014  . Seizure ([place])   . Incarcerated inguinal hernia 09/01/2014  . Respiratory failure, acute ([place]) 09/01/2014  . Metabolic acidosis 0000000  . Incarcerated hernia 09/01/2014  . Septic shock ([place]) 09/01/2014  . AKI (acute kidney injury) ([place])   . Protein-calorie malnutrition, severe ([place])  04/13/2013  . Sepsis ([place]) 04/11/2013  . H/O stroke within last year 04/11/2013  . Hypothyroidism 04/11/2013  . Pneumonia 04/11/2013  . Thrombocytopenia ([place]) 04/11/2013  . Schizophrenia ([place]) 04/11/2013  . Malnutrition ([place]) 04/11/2013  . SIRS (systemic inflammatory response syndrome) ([place]) 04/11/2013    Past Surgical History:  Procedure Laterality Date  . ESOPHAGOGASTRODUODENOSCOPY (EGD) WITH PROPOFOL N/A 09/08/2014   Procedure: ESOPHAGOGASTRODUODENOSCOPY (EGD) WITH PROPOFOL;  Surgeon: Georganna Skeans, MD;  Location: [place];  Service: Endoscopy;  Laterality: N/A;  . LAPAROTOMY N/A 09/01/2014   Procedure: EXPLORATORY LAPAROTOMY, SIGMOID COLECTOMY, SIGMOID COLOSTOMY, PRIMARY REPAIR OF STRANGULATED INGUINAL HERNIA.;  Surgeon: Rolm Bookbinder, MD;  Location: [facility] [place];  Service: General;  Laterality: N/A;  . LEG SURGERY    . PEG PLACEMENT N/A 09/08/2014   Procedure: PERCUTANEOUS ENDOSCOPIC GASTROSTOMY (PEG) PLACEMENT;  Surgeon: Georganna Skeans, MD;  Location: [facility];  Service: Endoscopy;  Laterality: N/A;  . PEG PLACEMENT N/A 06/30/2015   Procedure: PERCUTANEOUS ENDOSCOPIC GASTROSTOMY (PEG) REPLACEMENT;  Surgeon: Daneil Dolin, MD;  [facility];  Service: Endoscopy;  Laterality: N/A;  1100  . TESTICLE SURGERY         Home Medications    Prior to Admission medications   Medication Sig Start Date End Date Taking? Authorizing Provider  cholecalciferol (VITAMIN D) 1000 UNITS tablet Take 1,000 Units by mouth daily.    Historical Provider, MD  divalproex (DEPAKOTE SPRINKLE) 125  MG capsule Take 5 capsules (625 mg total) by mouth every 12 (twelve) hours. 11/21/14   Sinda Du, MD  ferrous sulfate 325 (65 FE) MG tablet Take 325 mg by mouth daily with breakfast.    Historical Provider, MD  furosemide (LASIX) 20 MG tablet Take 20 mg by mouth. 09/25/14 20 mg PO two times a day for SOB, [facility]    Historical Provider, MD  haloperidol (HALDOL) 10 MG tablet Place 1  tablet (10 mg total) into feeding tube every evening. 09/10/14   Nat Christen, PA-C  haloperidol decanoate (HALDOL DECANOATE) 100 MG/ML injection Inject 100 mg into the muscle every 28 (twenty-eight) days. Administered by [facility]    Historical Provider, MD  levETIRAcetam (KEPPRA) 250 MG tablet Take 250 mg by mouth daily.    Historical Provider, MD  levothyroxine (SYNTHROID, LEVOTHROID) 100 MCG tablet Place 1 tablet (100 mcg total) into feeding tube daily before breakfast. 09/10/14   Nat Christen, PA-C  loratadine (CLARITIN) 10 MG tablet Place 1 tablet (10 mg total) into feeding tube daily. 09/10/14   Nat Christen, PA-C  lovastatin (MEVACOR) 20 MG tablet Place 1 tablet (20 mg total) into feeding tube daily. 09/10/14   Nat Christen, PA-C  Nutritional Supplements (FEEDING SUPPLEMENT, JEVITY 1.5 CAL/FIBER,) LIQD Place 1,000 mLs into feeding tube continuous. Initiate nocturnal feedings of Jevity 1.5 @ 100 ml/hr via PEG over 14 hour period 09/10/14   Nat Christen, PA-C  oxybutynin (DITROPAN) 5 MG tablet Place 1 tablet (5 mg total) into feeding tube 2 (two) times daily. 09/10/14   Nat Christen, PA-C  piperacillin-tazobactam (ZOSYN) 3.375 GM/50ML IVPB Inject 50 mLs (3.375 g total) into the vein every 8 (eight) hours. 11/21/14   Sinda Du, MD  polyethylene glycol powder (GLYCOLAX/MIRALAX) powder Place 17 g into feeding tube daily as needed. Dissolved in 8 ounces of water/juice daily 09/10/14   Nat Christen, PA-C  sodium chloride 0.9 % injection 10-40 mLs by Intracatheter route as needed (flush). 11/21/14   Sinda Du, MD  vitamin C (ASCORBIC ACID) 500 MG tablet Take 500 mg by mouth 2 (two) times daily.    Historical Provider, MD    Family History Family History  Problem Relation Age of Onset  . Family history unknown: Yes    Social History Social History  Substance Use Topics  . Smoking status: Former Research scientist (life sciences)  . Smokeless tobacco: Never Used  . Alcohol use No  lives in a nursing  facility   Allergies   Patient has no known allergies.   Review of Systems Review of Systems  Unable to perform ROS: Other     Physical Exam Updated Vital Signs BP (!) 147/106 (BP Location: Left Arm)   Pulse 79   Temp 98 F (36.7 C) (Oral)   Resp 17   SpO2 100%   Vital signs normal except for hypertension   Physical Exam  Constitutional:  Non-toxic appearance. He does not appear ill. No distress.  Thin male appears chronically ill  HENT:  Head: Normocephalic and atraumatic.  Right Ear: External ear normal.  Left Ear: External ear normal.  Nose: Nose normal. No mucosal edema or rhinorrhea.  Mouth/Throat: Mucous membranes are normal. No dental abscesses or uvula swelling.  Tongue is dark with dark brown material on it  Eyes: Conjunctivae are normal.  Eyes are very disconjugate, he seems to indicate he can see out of both eyes however I think he is looking at me with the  right eye  Neck: Normal range of motion and full passive range of motion without pain. Neck supple.  Cardiovascular: Normal rate, regular rhythm and normal heart sounds.  Exam reveals no gallop and no friction rub.   No murmur heard. Pulmonary/Chest: Effort normal and breath sounds normal. No respiratory distress. He has no wheezes. He has no rhonchi. He has no rales. He exhibits no tenderness and no crepitus.  Abdominal: Soft. Normal appearance and bowel sounds are normal. He exhibits no distension. There is no tenderness. There is no rebound and no guarding.  Patient has a colostomy,with stool in the bag, he has scarring to the midline of his abdomen that is well-healed. He appears to have some diffuse tenderness to palpation without localization.   Musculoskeletal: Normal range of motion. He exhibits no edema or tenderness.  Moves all extremities well.   Neurological: He is alert. He has normal strength. A cranial nerve deficit is present.  Skin: Skin is warm, dry and intact. No rash noted. No erythema.  No pallor.  Psychiatric: He has a normal mood and affect. He is agitated.  Speaks loudly  Nursing note and vitals reviewed.    ED Treatments / Results  Labs (all labs ordered are listed, but only abnormal results are displayed) Results for orders placed or performed during the hospital encounter of 12/10/15  Comprehensive metabolic panel  Result Value Ref Range   Sodium 139 135 - 145 mmol/L   Potassium 3.9 3.5 - 5.1 mmol/L   Chloride 101 101 - 111 mmol/L   CO2 31 22 - 32 mmol/L   Glucose, Bld 79 65 - 99 mg/dL   BUN 23 (H) 6 - 20 mg/dL   Creatinine, Ser 0.73 0.61 - 1.24 mg/dL   Calcium 8.9 8.9 - 10.3 mg/dL   Total Protein 6.9 6.5 - 8.1 g/dL   Albumin 3.3 (L) 3.5 - 5.0 g/dL   AST 23 15 - 41 U/L   ALT 17 17 - 63 U/L   Alkaline Phosphatase 78 38 - 126 U/L   Total Bilirubin 0.4 0.3 - 1.2 mg/dL   GFR calc non Af Amer >60 >60 mL/min   GFR calc Af Amer >60 >60 mL/min   Anion gap 7 5 - 15  CBC with Differential  Result Value Ref Range   WBC 4.9 4.0 - 10.5 K/uL   RBC 3.76 (L) 4.22 - 5.81 MIL/uL   Hemoglobin 11.3 (L) 13.0 - 17.0 g/dL   HCT 35.2 (L) 39.0 - 52.0 %   MCV 93.6 78.0 - 100.0 fL   MCH 30.1 26.0 - 34.0 pg   MCHC 32.1 30.0 - 36.0 g/dL   RDW 15.0 11.5 - 15.5 %   Platelets 53 (L) 150 - 400 K/uL   Neutrophils Relative % 42 %   Neutro Abs 2.1 1.7 - 7.7 K/uL   Lymphocytes Relative 44 %   Lymphs Abs 2.2 0.7 - 4.0 K/uL   Monocytes Relative 9 %   Monocytes Absolute 0.5 0.1 - 1.0 K/uL   Eosinophils Relative 4 %   Eosinophils Absolute 0.2 0.0 - 0.7 K/uL   Basophils Relative 1 %   Basophils Absolute 0.0 0.0 - 0.1 K/uL   RBC Morphology STOMATOCYTES    Smear Review PLATELETS APPEAR DECREASED   Urinalysis, Routine w reflex microscopic  Result Value Ref Range   Color, Urine STRAW (A) YELLOW   APPearance CLEAR CLEAR   Specific Gravity, Urine <1.005 (L) 1.005 - 1.030   pH 7.0 5.0 -  8.0   Glucose, UA NEGATIVE NEGATIVE mg/dL   Hgb urine dipstick NEGATIVE NEGATIVE   Bilirubin  Urine NEGATIVE NEGATIVE   Ketones, ur NEGATIVE NEGATIVE mg/dL   Protein, ur NEGATIVE NEGATIVE mg/dL   Nitrite NEGATIVE NEGATIVE   Leukocytes, UA NEGATIVE NEGATIVE  Lipase, blood  Result Value Ref Range   Lipase 21 11 - 51 U/L    Laboratory interpretation all normal   EKG  EKG Interpretation None       Radiology Ct Head Wo Contrast  Result Date: 12/10/2015 CLINICAL DATA:  Acute onset of headache.  Initial encounter. EXAM: CT HEAD WITHOUT CONTRAST TECHNIQUE: Contiguous axial images were obtained from the base of the skull through the vertex without intravenous contrast. COMPARISON:  CT of the head performed 11/19/2014 FINDINGS: Brain: No evidence of acute infarction, hemorrhage, hydrocephalus, extra-axial collection or mass lesion/mass effect. Prominence of the ventricles and sulci reflects mild cortical volume loss. Scattered periventricular and subcortical white matter change likely reflects small vessel ischemic microangiopathy. Chronic lacunar infarcts are seen at the basal ganglia bilaterally. Cerebellar atrophy is noted. The brainstem and fourth ventricle are within normal limits. The cerebral hemispheres demonstrate grossly normal gray-white differentiation. No mass effect or midline shift is seen. Vascular: No hyperdense vessel or unexpected calcification. Skull: There is no evidence of fracture; visualized osseous structures are unremarkable in appearance. Sinuses/Orbits: The visualized portions of the orbits are within normal limits. The paranasal sinuses and mastoid air cells are well-aerated. Other: No significant soft tissue abnormalities are seen. IMPRESSION: 1. No acute intracranial pathology seen on CT. 2. Mild cortical volume loss and scattered small vessel ischemic microangiopathy. 3. Chronic lacunar infarcts at the basal ganglia bilaterally. Electronically Signed   By: Garald Balding M.D.   On: 12/10/2015 02:34   Dg Abd 2 Views  Result Date: 12/10/2015 CLINICAL DATA:  Acute  onset of generalized abdominal pain. Initial encounter. EXAM: ABDOMEN - 2 VIEW COMPARISON:  Abdominal radiograph performed 09/06/2014 FINDINGS: A G-tube is noted overlying the body of the stomach. The visualized bowel gas pattern is unremarkable. Scattered air and stool filled loops of colon are seen; no abnormal dilatation of small bowel loops is seen to suggest small bowel obstruction. No free intra-abdominal air is identified on the provided upright view. The visualized osseous structures are within normal limits; the sacroiliac joints are unremarkable in appearance. The visualized lung bases are essentially clear. IMPRESSION: Unremarkable bowel gas pattern; no free intra-abdominal air seen. Moderate amount of stool noted in the colon. Electronically Signed   By: Garald Balding M.D.   On: 12/10/2015 02:25    Procedures Procedures (including critical care time)  Medications Ordered in ED Medications  sodium chloride 0.9 % bolus 1,000 mL (0 mLs Intravenous Stopped 12/10/15 0339)  ondansetron (ZOFRAN) injection 4 mg (4 mg Intravenous Given 12/10/15 0200)     Initial Impression / Assessment and Plan / ED Course  I have reviewed the triage vital signs and the nursing notes.  Pertinent labs & imaging results that were available during my care of the patient were reviewed by me and considered in my medical decision making (see chart for details).  Clinical Course    Patient was given IV fluids. 2 view abdomen done to look for obstruction, patient has had lots of surgery on his abdomen and is at risk for having some bowel obstruction. Head CT done.  Patient continued to request something to drink. At one point he told the nurses he wanted sausage and eggs for  breakfast.  When I rechecked the patient at 6:30 AM he was no longer complaining of headache or abdominal pain. Now he states he feels dizzy and he has itching. He was discharged back to his facility.  Final Clinical Impressions(s) / ED  Diagnoses   Final diagnoses:  Nonintractable headache, unspecified chronicity pattern, unspecified headache type  Abdominal pain, unspecified abdominal location    Plan discharge  Rolland Porter, MD, Barbette Or, MD 12/10/15 [phone]

## 2015-12-10 NOTE — ED Notes (Signed)
Pt made aware to return if symptoms worsen or if any life threatening symptoms occur.   

## 2015-12-10 NOTE — ED Triage Notes (Signed)
Pt is a resident of [facility], arrives by ems for reports of headache and abd pain that apparently started tonight.  Pt denies hitting his head or any other trauma.  Pt denies vomiting.

## 2015-12-10 NOTE — ED Notes (Addendum)
Pt diaper changed, pt had urinated.  Pt in no acute distress at discharge, leaving with [facility] at this time.  Pt alert and oriented. Belongings including shoes and pants.

## 2015-12-14 DIAGNOSIS — F209 Schizophrenia, unspecified: Secondary | ICD-10-CM | POA: Diagnosis not present

## 2015-12-29 ENCOUNTER — Encounter (HOSPITAL_COMMUNITY): Payer: Self-pay | Admitting: Emergency Medicine

## 2015-12-29 ENCOUNTER — Emergency Department (HOSPITAL_COMMUNITY)
Admission: EM | Admit: 2015-12-29 | Discharge: 2015-12-29 | Disposition: A | Discharge status: Home / Self Care | Payer: Medicare Other | Attending: Emergency Medicine | Admitting: Emergency Medicine

## 2015-12-29 ENCOUNTER — Emergency Department (HOSPITAL_COMMUNITY): Payer: Medicare Other

## 2015-12-29 ENCOUNTER — Telehealth: Payer: Self-pay

## 2015-12-29 DIAGNOSIS — R279 Unspecified lack of coordination: Secondary | ICD-10-CM | POA: Diagnosis not present

## 2015-12-29 DIAGNOSIS — K9423 Gastrostomy malfunction: Secondary | ICD-10-CM | POA: Diagnosis not present

## 2015-12-29 DIAGNOSIS — Z434 Encounter for attention to other artificial openings of digestive tract: Secondary | ICD-10-CM

## 2015-12-29 DIAGNOSIS — Z743 Need for continuous supervision: Secondary | ICD-10-CM | POA: Diagnosis not present

## 2015-12-29 DIAGNOSIS — K9429 Other complications of gastrostomy: Secondary | ICD-10-CM | POA: Diagnosis not present

## 2015-12-29 DIAGNOSIS — Z87891 Personal history of nicotine dependence: Secondary | ICD-10-CM | POA: Diagnosis not present

## 2015-12-29 DIAGNOSIS — E039 Hypothyroidism, unspecified: Secondary | ICD-10-CM | POA: Diagnosis not present

## 2015-12-29 DIAGNOSIS — Z79899 Other long term (current) drug therapy: Secondary | ICD-10-CM | POA: Insufficient documentation

## 2015-12-29 DIAGNOSIS — T859XXA Unspecified complication of internal prosthetic device, implant and graft, initial encounter: Secondary | ICD-10-CM | POA: Diagnosis not present

## 2015-12-29 DIAGNOSIS — T85528A Displacement of other gastrointestinal prosthetic devices, implants and grafts, initial encounter: Secondary | ICD-10-CM

## 2015-12-29 DIAGNOSIS — Z4659 Encounter for fitting and adjustment of other gastrointestinal appliance and device: Secondary | ICD-10-CM

## 2015-12-29 DIAGNOSIS — T849XXA Unspecified complication of internal orthopedic prosthetic device, implant and graft, initial encounter: Secondary | ICD-10-CM | POA: Diagnosis not present

## 2015-12-29 DIAGNOSIS — Z431 Encounter for attention to gastrostomy: Secondary | ICD-10-CM | POA: Diagnosis not present

## 2015-12-29 MED ORDER — IOPAMIDOL (ISOVUE-370) INJECTION 76%
50.0000 mL | Freq: Once | INTRAVENOUS | Status: AC | PRN
  Administered 2015-12-29: 50 mL

## 2015-12-29 MED ORDER — IOPAMIDOL (ISOVUE-370) INJECTION 76%
INTRAVENOUS | Status: AC
  Administered 2015-12-29: 50 mL
  Filled 2015-12-29: qty 50

## 2015-12-29 NOTE — Telephone Encounter (Signed)
Received a call from Miles at [facility]. Pt was taken to the ED today because his peg tube came out. They were unable to replace it and put in a foley instead. The patient is able to use the foley. [facility]'s instructions were for them to use the foley until they could get it replaced by [facility].  Dr.Rourk, do you want the pt seen in the office first or can we just schedule him in endo for a peg replacement? Last peg replacement was on 06/30/15.

## 2015-12-29 NOTE — Telephone Encounter (Signed)
Have him come to endo tomorrow in between procedures - I'll replace

## 2015-12-29 NOTE — ED Notes (Signed)
EMS called at this time for transport back to [facility] (SNF) at this time.

## 2015-12-29 NOTE — ED Provider Notes (Signed)
Medical screening examination/treatment/procedure(s) were conducted as a shared visit with non-physician practitioner(s) and myself.  I personally evaluated the patient during the encounter.   EKG Interpretation None      Results for orders placed or performed during the hospital encounter of 12/10/15  Comprehensive metabolic panel  Result Value Ref Range   Sodium 139 135 - 145 mmol/L   Potassium 3.9 3.5 - 5.1 mmol/L   Chloride 101 101 - 111 mmol/L   CO2 31 22 - 32 mmol/L   Glucose, Bld 79 65 - 99 mg/dL   BUN 23 (H) 6 - 20 mg/dL   Creatinine, Ser 0.73 0.61 - 1.24 mg/dL   Calcium 8.9 8.9 - 10.3 mg/dL   Total Protein 6.9 6.5 - 8.1 g/dL   Albumin 3.3 (L) 3.5 - 5.0 g/dL   AST 23 15 - 41 U/L   ALT 17 17 - 63 U/L   Alkaline Phosphatase 78 38 - 126 U/L   Total Bilirubin 0.4 0.3 - 1.2 mg/dL   GFR calc non Af Amer >60 >60 mL/min   GFR calc Af Amer >60 >60 mL/min   Anion gap 7 5 - 15  CBC with Differential  Result Value Ref Range   WBC 4.9 4.0 - 10.5 K/uL   RBC 3.76 (L) 4.22 - 5.81 MIL/uL   Hemoglobin 11.3 (L) 13.0 - 17.0 g/dL   HCT 35.2 (L) 39.0 - 52.0 %   MCV 93.6 78.0 - 100.0 fL   MCH 30.1 26.0 - 34.0 pg   MCHC 32.1 30.0 - 36.0 g/dL   RDW 15.0 11.5 - 15.5 %   Platelets 53 (L) 150 - 400 K/uL   Neutrophils Relative % 42 %   Neutro Abs 2.1 1.7 - 7.7 K/uL   Lymphocytes Relative 44 %   Lymphs Abs 2.2 0.7 - 4.0 K/uL   Monocytes Relative 9 %   Monocytes Absolute 0.5 0.1 - 1.0 K/uL   Eosinophils Relative 4 %   Eosinophils Absolute 0.2 0.0 - 0.7 K/uL   Basophils Relative 1 %   Basophils Absolute 0.0 0.0 - 0.1 K/uL   RBC Morphology STOMATOCYTES    Smear Review PLATELETS APPEAR DECREASED   Urinalysis, Routine w reflex microscopic  Result Value Ref Range   Color, Urine STRAW (A) YELLOW   APPearance CLEAR CLEAR   Specific Gravity, Urine <1.005 (L) 1.005 - 1.030   pH 7.0 5.0 - 8.0   Glucose, UA NEGATIVE NEGATIVE mg/dL   Hgb urine dipstick NEGATIVE NEGATIVE   Bilirubin Urine  NEGATIVE NEGATIVE   Ketones, ur NEGATIVE NEGATIVE mg/dL   Protein, ur NEGATIVE NEGATIVE mg/dL   Nitrite NEGATIVE NEGATIVE   Leukocytes, UA NEGATIVE NEGATIVE  Lipase, blood  Result Value Ref Range   Lipase 21 11 - 51 U/L   Ct Head Wo Contrast  Result Date: 12/10/2015 CLINICAL DATA:  Acute onset of headache.  Initial encounter. EXAM: CT HEAD WITHOUT CONTRAST TECHNIQUE: Contiguous axial images were obtained from the base of the skull through the vertex without intravenous contrast. COMPARISON:  CT of the head performed 11/19/2014 FINDINGS: Brain: No evidence of acute infarction, hemorrhage, hydrocephalus, extra-axial collection or mass lesion/mass effect. Prominence of the ventricles and sulci reflects mild cortical volume loss. Scattered periventricular and subcortical white matter change likely reflects small vessel ischemic microangiopathy. Chronic lacunar infarcts are seen at the basal ganglia bilaterally. Cerebellar atrophy is noted. The brainstem and fourth ventricle are within normal limits. The cerebral hemispheres demonstrate grossly normal gray-white differentiation. No mass  effect or midline shift is seen. Vascular: No hyperdense vessel or unexpected calcification. Skull: There is no evidence of fracture; visualized osseous structures are unremarkable in appearance. Sinuses/Orbits: The visualized portions of the orbits are within normal limits. The paranasal sinuses and mastoid air cells are well-aerated. Other: No significant soft tissue abnormalities are seen. IMPRESSION: 1. No acute intracranial pathology seen on CT. 2. Mild cortical volume loss and scattered small vessel ischemic microangiopathy. 3. Chronic lacunar infarcts at the basal ganglia bilaterally. Electronically Signed   By: Garald Balding M.D.   On: 12/10/2015 02:34   Dg Abd 2 Views  Result Date: 12/10/2015 CLINICAL DATA:  Acute onset of generalized abdominal pain. Initial encounter. EXAM: ABDOMEN - 2 VIEW COMPARISON:  Abdominal  radiograph performed 09/06/2014 FINDINGS: A G-tube is noted overlying the body of the stomach. The visualized bowel gas pattern is unremarkable. Scattered air and stool filled loops of colon are seen; no abnormal dilatation of small bowel loops is seen to suggest small bowel obstruction. No free intra-abdominal air is identified on the provided upright view. The visualized osseous structures are within normal limits; the sacroiliac joints are unremarkable in appearance. The visualized lung bases are essentially clear. IMPRESSION: Unremarkable bowel gas pattern; no free intra-abdominal air seen. Moderate amount of stool noted in the colon. Electronically Signed   By: Garald Balding M.D.   On: 12/10/2015 02:25   Dg Cm Inj Any Colonic Tube W/fluoro  Result Date: 12/29/2015 INDICATION: Replaced gastrostomy tube EXAM: INJECTION OF GASTROSTOMY TUBE WITH FLUOROSCOPY MEDICATIONS: None ANESTHESIA/SEDATION: None CONTRAST:  50 ml Isovue-300 administered into the gastric lumen. FLUOROSCOPY TIME:  Fluoroscopy Time: 0 minutes 30 seconds (7.1 mGy). COMPLICATIONS: None immediate. PROCEDURE: Injected constrast material opacifies the gastric lumen. No contrast extravasation identified. Normal passage of contrast across the pylorus into the proximal duodenum. IMPRESSION: Replaced gastrostomy tube is located within the gastric lumen. Electronically Signed   By: Lavonia Dana M.D.   On: 12/29/2015 13:26   Medical screening examination/treatment/procedure(s) were conducted as a shared visit with non-physician practitioner(s) and myself.  I personally evaluated the patient during the encounter. Patient seen by me along with physician assistant.    EKG Interpretation None       Patient sent in for G-tube replacement. Came out at some point in time since yesterday. We attempted to go and dilate him up with different size Foley catheters to replace the G-tube. That was not successful. Contacted radiology that under fluoroscopy  when had replaced the G-tube. Confirmed by Gastrografin.   Fredia Sorrow, MD 12/29/15 [phone]

## 2015-12-29 NOTE — ED Notes (Signed)
Report given to [facility], LPN at [facility].

## 2015-12-29 NOTE — ED Triage Notes (Signed)
Patient from [facility], states he pulled out his peg tube this morning.

## 2015-12-29 NOTE — ED Provider Notes (Signed)
[facility] DEPT Provider Note   CSN: [id number] Arrival date & time: 12/29/15  8144     History   Chief Complaint Chief Complaint  Patient presents with  . Peg Tube Removed    HPI Steven David is a 60 y.o. male nursing home patient with past medical history as outlined below presenting for an accidental removal of this PEG tube while sleeping last night.  The PEG is used due to significant dysphagia.  He denies significant pain around the site of the PEG tube, but endorses that it is "sore".  There are no other complaints at this time, no nausea or vomiting.  The history is provided by the patient and the nursing home.    Past Medical History:  Diagnosis Date  . Cognitive communication deficit   . Drug-induced Parkinsonism ([place])   . Dysphagia   . Hernia   . High cholesterol   . HOH (hard of hearing)   . Malnutrition ([place])   . Schizophrenia ([place])   . Seizures ([place])   . Thyroid disease   . TIA (transient ischemic attack)     Patient Active Problem List   Diagnosis Date Noted  . Malfunction of percutaneous endoscopic gastrostomy (PEG) tube ([place])   . Malnutrition of moderate degree 11/20/2014  . HCAP (healthcare-associated pneumonia) 11/19/2014  . Postoperative anemia due to acute blood loss 09/07/2014  . Seizure ([place])   . Incarcerated inguinal hernia 09/01/2014  . Respiratory failure, acute ([place]) 09/01/2014  . Metabolic acidosis 81/85/6314  . Incarcerated hernia 09/01/2014  . Septic shock ([place]) 09/01/2014  . AKI (acute kidney injury) ([place])   . Protein-calorie malnutrition, severe ([place]) 04/13/2013  . Sepsis ([place]) 04/11/2013  . H/O stroke within last year 04/11/2013  . Hypothyroidism 04/11/2013  . Pneumonia 04/11/2013  . Thrombocytopenia ([place]) 04/11/2013  . Schizophrenia ([place]) 04/11/2013  . Malnutrition ([place]) 04/11/2013  . SIRS (systemic inflammatory response syndrome) ([place]) 04/11/2013    Past Surgical History:  Procedure Laterality Date  .  ESOPHAGOGASTRODUODENOSCOPY (EGD) WITH PROPOFOL N/A 09/08/2014   Procedure: ESOPHAGOGASTRODUODENOSCOPY (EGD) WITH PROPOFOL;  Surgeon: Georganna Skeans, MD;  Location: [facility];  Service: Endoscopy;  Laterality: N/A;  . LAPAROTOMY N/A 09/01/2014   Procedure: EXPLORATORY LAPAROTOMY, SIGMOID COLECTOMY, SIGMOID COLOSTOMY, PRIMARY REPAIR OF STRANGULATED INGUINAL HERNIA.;  Surgeon: Rolm Bookbinder, MD;  Location: [facility];  Service: General;  Laterality: N/A;  . LEG SURGERY    . PEG PLACEMENT N/A 09/08/2014   Procedure: PERCUTANEOUS ENDOSCOPIC GASTROSTOMY (PEG) PLACEMENT;  Surgeon: Georganna Skeans, MD;  Location: [facility];  Service: Endoscopy;  Laterality: N/A;  . PEG PLACEMENT N/A 06/30/2015   Procedure: PERCUTANEOUS ENDOSCOPIC GASTROSTOMY (PEG) REPLACEMENT;  Surgeon: Daneil Dolin, MD;  [facility];  Service: Endoscopy;  Laterality: N/A;  1100  . TESTICLE SURGERY         Home Medications    Prior to Admission medications   Medication Sig Start Date End Date Taking? Authorizing Provider  cholecalciferol (VITAMIN D) 1000 UNITS tablet Take 1,000 Units by mouth daily.    Historical Provider, MD  divalproex (DEPAKOTE SPRINKLE) 125 MG capsule Take 5 capsules (625 mg total) by mouth every 12 (twelve) hours. 11/21/14   Sinda Du, MD  ferrous sulfate 325 (65 FE) MG tablet Take 325 mg by mouth daily with breakfast.    Historical Provider, MD  furosemide (LASIX) 20 MG tablet Take 20 mg by mouth. 09/25/14 20 mg PO two times a day for SOB, [facility] MAR    Historical  Provider, MD  haloperidol (HALDOL) 10 MG tablet Place 1 tablet (10 mg total) into feeding tube every evening. 09/10/14   Nat Christen, PA-C  haloperidol decanoate (HALDOL DECANOATE) 100 MG/ML injection Inject 100 mg into the muscle every 28 (twenty-eight) days. Administered by [facility]    Historical Provider, MD  levETIRAcetam (KEPPRA) 250 MG tablet Take 250 mg by mouth daily.    Historical Provider, MD  levothyroxine (SYNTHROID,  LEVOTHROID) 100 MCG tablet Place 1 tablet (100 mcg total) into feeding tube daily before breakfast. 09/10/14   Nat Christen, PA-C  loratadine (CLARITIN) 10 MG tablet Place 1 tablet (10 mg total) into feeding tube daily. 09/10/14   Nat Christen, PA-C  lovastatin (MEVACOR) 20 MG tablet Place 1 tablet (20 mg total) into feeding tube daily. 09/10/14   Nat Christen, PA-C  Nutritional Supplements (FEEDING SUPPLEMENT, JEVITY 1.5 CAL/FIBER,) LIQD Place 1,000 mLs into feeding tube continuous. Initiate nocturnal feedings of Jevity 1.5 @ 100 ml/hr via PEG over 14 hour period 09/10/14   Nat Christen, PA-C  oxybutynin (DITROPAN) 5 MG tablet Place 1 tablet (5 mg total) into feeding tube 2 (two) times daily. 09/10/14   Nat Christen, PA-C  piperacillin-tazobactam (ZOSYN) 3.375 GM/50ML IVPB Inject 50 mLs (3.375 g total) into the vein every 8 (eight) hours. 11/21/14   Sinda Du, MD  polyethylene glycol powder (GLYCOLAX/MIRALAX) powder Place 17 g into feeding tube daily as needed. Dissolved in 8 ounces of water/juice daily 09/10/14   Nat Christen, PA-C  sodium chloride 0.9 % injection 10-40 mLs by Intracatheter route as needed (flush). 11/21/14   Sinda Du, MD  vitamin C (ASCORBIC ACID) 500 MG tablet Take 500 mg by mouth 2 (two) times daily.    Historical Provider, MD    Family History Family History  Problem Relation Age of Onset  . Family history unknown: Yes    Social History Social History  Substance Use Topics  . Smoking status: Former Research scientist (life sciences)  . Smokeless tobacco: Never Used  . Alcohol use No     Allergies   Patient has no known allergies.   Review of Systems Review of Systems  Constitutional: Negative for fever.  HENT: Negative for congestion and sore throat.   Eyes: Negative.   Respiratory: Negative for chest tightness and shortness of breath.   Cardiovascular: Negative for chest pain.  Gastrointestinal: Negative for abdominal distention, nausea and vomiting.       Negative except as  mentioned in HPI.   Genitourinary: Negative.   Musculoskeletal: Negative for arthralgias, joint swelling and neck pain.  Skin: Negative.  Negative for rash and wound.  Neurological: Negative for dizziness, weakness, light-headedness, numbness and headaches.  Psychiatric/Behavioral: Negative.      Physical Exam Updated Vital Signs BP 138/78 (BP Location: Left Arm)   Pulse 61   Temp 97.7 F (36.5 C) (Oral)   Resp 18   Ht 5' 10"  (1.778 m)   Wt 59 kg   SpO2 99%   BMI 18.65 kg/m   Physical Exam  Constitutional: He appears well-developed.  cachectic  HENT:  Head: Normocephalic and atraumatic.  Eyes: Conjunctivae are normal.  Cardiovascular: Normal rate, regular rhythm and normal heart sounds.   Pulmonary/Chest: Effort normal and breath sounds normal. He has no wheezes.  Abdominal: Soft. Bowel sounds are normal. He exhibits no distension and no mass. There is no tenderness. There is no guarding.  Peg tube ostomy site left upper quadrant superior to his colostomy bag.  Musculoskeletal: Normal range of motion.  Neurological: He is alert.  Skin: Skin is warm and dry.  Psychiatric: He has a normal mood and affect.  Nursing note and vitals reviewed.    ED Treatments / Results  Labs (all labs ordered are listed, but only abnormal results are displayed) Labs Reviewed - No data to display  EKG  EKG Interpretation None       Radiology Dg Cm Inj Any Colonic Tube W/fluoro  Result Date: 12/29/2015 INDICATION: Replaced gastrostomy tube EXAM: INJECTION OF GASTROSTOMY TUBE WITH FLUOROSCOPY MEDICATIONS: None ANESTHESIA/SEDATION: None CONTRAST:  50 ml Isovue-300 administered into the gastric lumen. FLUOROSCOPY TIME:  Fluoroscopy Time: 0 minutes 30 seconds (7.1 mGy). COMPLICATIONS: None immediate. PROCEDURE: Injected constrast material opacifies the gastric lumen. No contrast extravasation identified. Normal passage of contrast across the pylorus into the proximal duodenum.  IMPRESSION: Replaced gastrostomy tube is located within the gastric lumen. Electronically Signed   By: Lavonia Dana M.D.   On: 12/29/2015 13:26    Procedures Gastrostomy tube replacement Date/Time: 12/29/2015 11:30 AM Performed by: Evalee Jefferson Authorized by: Evalee Jefferson  Consent: Verbal consent obtained. Risks and benefits: risks, benefits and alternatives were discussed Consent given by: patient Patient understanding: patient states understanding of the procedure being performed Patient identity confirmed: verbally with patient Time out: Immediately prior to procedure a "time out" was called to verify the correct patient, procedure, equipment, support staff and site/side marked as required. Local anesthesia used: no  Anesthesia: Local anesthesia used: no  Sedation: Patient sedated: no Comments: Attempt to insert 20 gauge g tube as replacement for the old 20 gauge at bedside - met with significant resistance.  I was able to easily insert # 16 foley,  #18 foley bet with some resistance but inserted without discomfort or significant force.  A small amount of blood was seen at the tip of the catheter upon removal.  Attempt to insert the 20 gauge g tube again met with resistance, so the #18 gauge foley was replaced, balloon inflated with 5cc of sterile water without difficulty.    (including critical care time)    Medications Ordered in ED Medications  iopamidol (ISOVUE-370) 76 % injection 50 mL (50 mLs Other Contrast Given 12/29/15 1158)     Initial Impression / Assessment and Plan / ED Course  I have reviewed the triage vital signs and the nursing notes.  Pertinent labs & imaging results that were available during my care of the patient were reviewed by me and considered in my medical decision making (see chart for details).  Clinical Course     Post procedure films with gastrografin confirms correct placement of temp foley.  Pt will need to be scheduled with his GI specialist  for reinsertion of his g tube. This info was told to the patient and also relayed to his nursing home.    The patient appears reasonably screened and/or stabilized for discharge and I doubt any other medical condition or other [facility] requiring further screening, evaluation, or treatment in the ED at this time prior to discharge.   Final Clinical Impressions(s) / ED Diagnoses   Final diagnoses:  Gastrojejunostomy tube dislodgement [facility])    New Prescriptions Discharge Medication List as of 12/29/2015  1:36 PM       Evalee Jefferson, PA-C 12/30/15 [id number]    Fredia Sorrow, MD 12/30/15 [phone]

## 2015-12-30 ENCOUNTER — Ambulatory Visit (HOSPITAL_COMMUNITY): Admit: 2015-12-30 | Payer: No Typology Code available for payment source | Admitting: Internal Medicine

## 2015-12-30 ENCOUNTER — Other Ambulatory Visit: Payer: Self-pay

## 2015-12-30 ENCOUNTER — Ambulatory Visit (HOSPITAL_COMMUNITY)
Admission: RE | Admit: 2015-12-30 | Discharge: 2015-12-30 | Disposition: A | Discharge status: Home / Self Care | Payer: Medicare Other | Source: Ambulatory Visit | Attending: Internal Medicine | Admitting: Internal Medicine

## 2015-12-30 ENCOUNTER — Telehealth: Payer: Self-pay | Admitting: Internal Medicine

## 2015-12-30 ENCOUNTER — Encounter (HOSPITAL_COMMUNITY)
Admission: RE | Disposition: A | Discharge status: Home / Self Care | Payer: Self-pay | Source: Ambulatory Visit | Attending: Internal Medicine

## 2015-12-30 DIAGNOSIS — Z431 Encounter for attention to gastrostomy: Secondary | ICD-10-CM

## 2015-12-30 HISTORY — PX: PEG PLACEMENT: SHX5437

## 2015-12-30 SURGERY — REPLACEMENT, PEG TUBE, WITHOUT ENDOSCOPY
Anesthesia: LOCAL

## 2015-12-30 NOTE — Telephone Encounter (Signed)
Steven David called to inform us that he still has a foley cath and not a PEG tube. I talked with his nurse Densia at [facility] and she said that there was still a 18 french foley cath in place. I called Threasa Beards and told her and we are going to get him on over there with in the next 10 minutes.

## 2015-12-30 NOTE — Procedures (Signed)
Patient's PEG tube fell out at the nursing home. Foley placed by EDP yesterday. He presents to endoscopy for PEG change. Found a Foley in place. I took down the balloon and removed it. The ostomy site look fine.  I obtained a new 20 French angulated balloon Microvasive replacement tube. I placed it through the ostomy; inflated the balloon with 6 mL of sterile water. The external bumper was appropriately snug at the 2 cm mark. Please note when I placed the PEG tube into the gastrostomy there was immediate free flow of gastric contents. Moreover, there was excellent auscultation of insufflated air indicating the PEG tube was positioned within the gastric cavity  Patient tolerated the procedure well. I recommend resuming usual care and use of the feeding tube.

## 2015-12-30 NOTE — Telephone Encounter (Signed)
Noted  

## 2015-12-30 NOTE — Telephone Encounter (Signed)
Ginger, please arrange this.

## 2015-12-30 NOTE — Telephone Encounter (Signed)
Estill Bamberg from [facility] called to cancel patient's procedure for today at 2pm because patient had PEG tube placed in the ED yesterday.

## 2015-12-30 NOTE — Telephone Encounter (Signed)
Spoke with Estill Bamberg at [facility] and told her that he needed to be at [facility] Stay today at 1:00 pm.

## 2015-12-30 NOTE — Telephone Encounter (Signed)
I have replaced his Foley with a new PEG tube

## 2016-01-05 ENCOUNTER — Encounter (HOSPITAL_COMMUNITY): Payer: Self-pay | Admitting: Internal Medicine

## 2016-02-29 DIAGNOSIS — M79674 Pain in right toe(s): Secondary | ICD-10-CM | POA: Diagnosis not present

## 2016-02-29 DIAGNOSIS — M79675 Pain in left toe(s): Secondary | ICD-10-CM | POA: Diagnosis not present

## 2016-02-29 DIAGNOSIS — B351 Tinea unguium: Secondary | ICD-10-CM | POA: Diagnosis not present

## 2016-03-06 DIAGNOSIS — Z933 Colostomy status: Secondary | ICD-10-CM | POA: Diagnosis not present

## 2016-03-06 DIAGNOSIS — R569 Unspecified convulsions: Secondary | ICD-10-CM | POA: Diagnosis not present

## 2016-03-06 DIAGNOSIS — F209 Schizophrenia, unspecified: Secondary | ICD-10-CM | POA: Diagnosis not present

## 2016-03-06 DIAGNOSIS — H9190 Unspecified hearing loss, unspecified ear: Secondary | ICD-10-CM | POA: Diagnosis not present

## 2016-03-20 DIAGNOSIS — R569 Unspecified convulsions: Secondary | ICD-10-CM | POA: Diagnosis not present

## 2016-03-20 DIAGNOSIS — F209 Schizophrenia, unspecified: Secondary | ICD-10-CM | POA: Diagnosis not present

## 2016-03-20 DIAGNOSIS — R05 Cough: Secondary | ICD-10-CM | POA: Diagnosis not present

## 2016-03-20 DIAGNOSIS — H9190 Unspecified hearing loss, unspecified ear: Secondary | ICD-10-CM | POA: Diagnosis not present

## 2016-03-28 DIAGNOSIS — M79622 Pain in left upper arm: Secondary | ICD-10-CM | POA: Diagnosis not present

## 2016-03-28 DIAGNOSIS — M19012 Primary osteoarthritis, left shoulder: Secondary | ICD-10-CM | POA: Diagnosis not present

## 2016-03-28 DIAGNOSIS — M25522 Pain in left elbow: Secondary | ICD-10-CM | POA: Diagnosis not present

## 2016-04-04 DIAGNOSIS — F209 Schizophrenia, unspecified: Secondary | ICD-10-CM | POA: Diagnosis not present

## 2016-04-25 ENCOUNTER — Emergency Department (HOSPITAL_COMMUNITY)
Admission: EM | Admit: 2016-04-25 | Discharge: 2016-04-26 | Disposition: A | Discharge status: Home / Self Care | Payer: Medicare Other | Attending: Emergency Medicine | Admitting: Emergency Medicine

## 2016-04-25 ENCOUNTER — Encounter (HOSPITAL_COMMUNITY): Payer: Self-pay | Admitting: Emergency Medicine

## 2016-04-25 DIAGNOSIS — F17200 Nicotine dependence, unspecified, uncomplicated: Secondary | ICD-10-CM | POA: Diagnosis not present

## 2016-04-25 DIAGNOSIS — Z79899 Other long term (current) drug therapy: Secondary | ICD-10-CM | POA: Insufficient documentation

## 2016-04-25 DIAGNOSIS — R454 Irritability and anger: Secondary | ICD-10-CM | POA: Insufficient documentation

## 2016-04-25 DIAGNOSIS — E039 Hypothyroidism, unspecified: Secondary | ICD-10-CM | POA: Insufficient documentation

## 2016-04-25 LAB — CBC WITH DIFFERENTIAL/PLATELET
BASOS ABS: 0 10*3/uL (ref 0.0–0.1)
Basophils Relative: 0 %
EOS ABS: 0.2 10*3/uL (ref 0.0–0.7)
Eosinophils Relative: 2 %
HCT: 40.3 % (ref 39.0–52.0)
HEMOGLOBIN: 13 g/dL (ref 13.0–17.0)
LYMPHS ABS: 1.8 10*3/uL (ref 0.7–4.0)
LYMPHS PCT: 20 %
MCH: 30.1 pg (ref 26.0–34.0)
MCHC: 32.3 g/dL (ref 30.0–36.0)
MCV: 93.3 fL (ref 78.0–100.0)
Monocytes Absolute: 0.8 10*3/uL (ref 0.1–1.0)
Monocytes Relative: 9 %
NEUTROS PCT: 70 %
Neutro Abs: 6.4 10*3/uL (ref 1.7–7.7)
Platelets: 57 10*3/uL — ABNORMAL LOW (ref 150–400)
RBC: 4.32 MIL/uL (ref 4.22–5.81)
RDW: 15.6 % — ABNORMAL HIGH (ref 11.5–15.5)
WBC: 9.2 10*3/uL (ref 4.0–10.5)

## 2016-04-25 LAB — RAPID URINE DRUG SCREEN, HOSP PERFORMED
AMPHETAMINES: NOT DETECTED
BENZODIAZEPINES: NOT DETECTED
Barbiturates: NOT DETECTED
COCAINE: NOT DETECTED
OPIATES: NOT DETECTED
TETRAHYDROCANNABINOL: NOT DETECTED

## 2016-04-25 LAB — BASIC METABOLIC PANEL
ANION GAP: 7 (ref 5–15)
BUN: 18 mg/dL (ref 6–20)
CHLORIDE: 102 mmol/L (ref 101–111)
CO2: 33 mmol/L — ABNORMAL HIGH (ref 22–32)
Calcium: 9.1 mg/dL (ref 8.9–10.3)
Creatinine, Ser: 0.81 mg/dL (ref 0.61–1.24)
GFR calc Af Amer: >60 mL/min (ref >60)
Glucose, Bld: 79 mg/dL (ref 65–99)
POTASSIUM: 3.8 mmol/L (ref 3.5–5.1)
SODIUM: 142 mmol/L (ref 135–145)

## 2016-04-25 NOTE — ED Triage Notes (Signed)
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 Per [employer] pt has been calm and cooperative with him. Officer seems to think facility is going to try to discharge him for their service. Pt denies any si/hi at this time.
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
## 2016-04-26 DIAGNOSIS — R454 Irritability and anger: Secondary | ICD-10-CM | POA: Diagnosis not present

## 2016-04-26 DIAGNOSIS — Z7401 Bed confinement status: Secondary | ICD-10-CM | POA: Diagnosis not present

## 2016-04-26 DIAGNOSIS — R279 Unspecified lack of coordination: Secondary | ICD-10-CM | POA: Diagnosis not present

## 2016-04-26 NOTE — ED Notes (Signed)
Report called to [facility]. 

## 2016-04-26 NOTE — Discharge Instructions (Signed)
Recheck as needed °

## 2016-04-26 NOTE — ED Notes (Signed)
Pt carried out on stretcher by EMS.

## 2016-04-26 NOTE — ED Notes (Signed)
Pt speaking with TTS 
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
## 2016-04-26 NOTE — ED Provider Notes (Signed)
12:39 AM Steven David, TTS, has evaluated patient she states he has no suicidal ideation, homicidal ideation, hallucinations, or complaints of depression. She states at this point he does not meet criteria for inpatient psychiatric admission. She recommends he be returned to his nursing facility.  12:50 AM Pt informed he is going back to his facility, he did not refuse.   Rolland Porter, MD, Barbette Or, MD 04/26/16 [phone]

## 2016-04-27 DIAGNOSIS — F918 Other conduct disorders: Secondary | ICD-10-CM | POA: Diagnosis not present

## 2016-04-27 DIAGNOSIS — F209 Schizophrenia, unspecified: Secondary | ICD-10-CM | POA: Diagnosis not present

## 2016-04-27 DIAGNOSIS — R569 Unspecified convulsions: Secondary | ICD-10-CM | POA: Diagnosis not present

## 2016-05-16 DIAGNOSIS — M79674 Pain in right toe(s): Secondary | ICD-10-CM | POA: Diagnosis not present

## 2016-05-16 DIAGNOSIS — M79675 Pain in left toe(s): Secondary | ICD-10-CM | POA: Diagnosis not present

## 2016-05-16 DIAGNOSIS — B351 Tinea unguium: Secondary | ICD-10-CM | POA: Diagnosis not present

## 2016-05-22 DIAGNOSIS — D649 Anemia, unspecified: Secondary | ICD-10-CM | POA: Diagnosis not present

## 2016-05-22 DIAGNOSIS — A498 Other bacterial infections of unspecified site: Secondary | ICD-10-CM | POA: Diagnosis not present

## 2016-05-22 DIAGNOSIS — R319 Hematuria, unspecified: Secondary | ICD-10-CM | POA: Diagnosis not present

## 2016-06-01 ENCOUNTER — Emergency Department (HOSPITAL_COMMUNITY)
Admission: EM | Admit: 2016-06-01 | Discharge: 2016-06-02 | Disposition: A | Discharge status: Home / Self Care | Payer: Medicare Other | Attending: Emergency Medicine | Admitting: Emergency Medicine

## 2016-06-01 ENCOUNTER — Encounter (HOSPITAL_COMMUNITY): Payer: Self-pay

## 2016-06-01 DIAGNOSIS — F918 Other conduct disorders: Secondary | ICD-10-CM | POA: Insufficient documentation

## 2016-06-01 DIAGNOSIS — Z79899 Other long term (current) drug therapy: Secondary | ICD-10-CM | POA: Insufficient documentation

## 2016-06-01 DIAGNOSIS — R4689 Other symptoms and signs involving appearance and behavior: Secondary | ICD-10-CM

## 2016-06-01 DIAGNOSIS — K9423 Gastrostomy malfunction: Secondary | ICD-10-CM | POA: Diagnosis not present

## 2016-06-01 DIAGNOSIS — F209 Schizophrenia, unspecified: Secondary | ICD-10-CM | POA: Diagnosis not present

## 2016-06-01 DIAGNOSIS — Z046 Encounter for general psychiatric examination, requested by authority: Secondary | ICD-10-CM | POA: Diagnosis present

## 2016-06-01 DIAGNOSIS — F17210 Nicotine dependence, cigarettes, uncomplicated: Secondary | ICD-10-CM | POA: Diagnosis not present

## 2016-06-01 DIAGNOSIS — Z8673 Personal history of transient ischemic attack (TIA), and cerebral infarction without residual deficits: Secondary | ICD-10-CM

## 2016-06-01 DIAGNOSIS — F17200 Nicotine dependence, unspecified, uncomplicated: Secondary | ICD-10-CM | POA: Diagnosis not present

## 2016-06-01 LAB — CBC WITH DIFFERENTIAL/PLATELET
BASOS ABS: 0 10*3/uL (ref 0.0–0.1)
BASOS PCT: 0 %
Eosinophils Absolute: 0.2 10*3/uL (ref 0.0–0.7)
Eosinophils Relative: 3 %
HEMATOCRIT: 44 % (ref 39.0–52.0)
Hemoglobin: 14.3 g/dL (ref 13.0–17.0)
Lymphocytes Relative: 18 %
Lymphs Abs: 1.3 10*3/uL (ref 0.7–4.0)
MCH: 30.2 pg (ref 26.0–34.0)
MCHC: 32.5 g/dL (ref 30.0–36.0)
MCV: 93 fL (ref 78.0–100.0)
MONO ABS: 0.8 10*3/uL (ref 0.1–1.0)
Monocytes Relative: 11 %
NEUTROS ABS: 4.9 10*3/uL (ref 1.7–7.7)
Neutrophils Relative %: 68 %
PLATELETS: 63 10*3/uL — AB (ref 150–400)
RBC: 4.73 MIL/uL (ref 4.22–5.81)
RDW: 15.3 % (ref 11.5–15.5)
WBC: 7.3 10*3/uL (ref 4.0–10.5)

## 2016-06-01 LAB — BASIC METABOLIC PANEL
ANION GAP: 9 (ref 5–15)
BUN: 19 mg/dL (ref 6–20)
CALCIUM: 9.6 mg/dL (ref 8.9–10.3)
CO2: 30 mmol/L (ref 22–32)
Chloride: 101 mmol/L (ref 101–111)
Creatinine, Ser: 0.85 mg/dL (ref 0.61–1.24)
GLUCOSE: 87 mg/dL (ref 65–99)
Potassium: 3.9 mmol/L (ref 3.5–5.1)
SODIUM: 140 mmol/L (ref 135–145)

## 2016-06-01 LAB — RAPID URINE DRUG SCREEN, HOSP PERFORMED
Amphetamines: NOT DETECTED
Barbiturates: NOT DETECTED
Benzodiazepines: NOT DETECTED
COCAINE: NOT DETECTED
OPIATES: NOT DETECTED
Tetrahydrocannabinol: NOT DETECTED

## 2016-06-01 LAB — VALPROIC ACID LEVEL: Valproic Acid Lvl: 21 ug/mL — ABNORMAL LOW (ref 50.0–100.0)

## 2016-06-01 LAB — ETHANOL: Alcohol, Ethyl (B): <5 mg/dL (ref <5)
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
## 2016-06-01 NOTE — ED Notes (Signed)
Pt says he had argument w/ staff, that its his constitutional right to do what he wants to. Tried to inform pt that when in facility they have rules that has to be followed. Pt stated a little louder that he had constitutional rights.
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
## 2016-06-02 DIAGNOSIS — K9423 Gastrostomy malfunction: Secondary | ICD-10-CM | POA: Diagnosis not present

## 2016-06-02 DIAGNOSIS — Z743 Need for continuous supervision: Secondary | ICD-10-CM | POA: Diagnosis not present

## 2016-06-02 DIAGNOSIS — F918 Other conduct disorders: Secondary | ICD-10-CM | POA: Diagnosis not present

## 2016-06-02 DIAGNOSIS — F17210 Nicotine dependence, cigarettes, uncomplicated: Secondary | ICD-10-CM | POA: Diagnosis not present

## 2016-06-02 DIAGNOSIS — F209 Schizophrenia, unspecified: Secondary | ICD-10-CM

## 2016-06-02 DIAGNOSIS — R279 Unspecified lack of coordination: Secondary | ICD-10-CM | POA: Diagnosis not present

## 2016-06-02 MED ORDER — JEVITY 1.5 CAL/FIBER PO LIQD
1000.0000 mL | ORAL | Status: DC
  Administered 2016-06-02: 1000 mL
  Filled 2016-06-02 (×10): qty 1000

## 2016-06-02 MED ORDER — LEVETIRACETAM 250 MG PO TABS: 250.0000 mg | ORAL_TABLET | Freq: Two times a day (BID) | ORAL | Status: DC

## 2016-06-02 MED ORDER — LORATADINE 10 MG PO TABS
10.0000 mg | ORAL_TABLET | Freq: Every day | ORAL | Status: DC
Start: 2016-06-02 — End: 2016-06-02
  Administered 2016-06-02: 10 mg
  Filled 2016-06-02: qty 1

## 2016-06-02 MED ORDER — DIVALPROEX SODIUM 125 MG PO CSDR
DELAYED_RELEASE_CAPSULE | ORAL | Status: AC
  Filled 2016-06-02: qty 5

## 2016-06-02 MED ORDER — HALOPERIDOL 5 MG PO TABS: 10.0000 mg | ORAL_TABLET | Freq: Every evening | ORAL | Status: DC

## 2016-06-02 MED ORDER — ALUM & MAG HYDROXIDE-SIMETH 200-200-20 MG/5ML PO SUSP: 30.0000 mL | Freq: Four times a day (QID) | ORAL | Status: DC | PRN

## 2016-06-02 MED ORDER — ONDANSETRON HCL 4 MG PO TABS: 4.0000 mg | ORAL_TABLET | Freq: Three times a day (TID) | ORAL | Status: DC | PRN

## 2016-06-02 MED ORDER — ACETAMINOPHEN 325 MG PO TABS: 650.0000 mg | ORAL_TABLET | ORAL | Status: DC | PRN

## 2016-06-02 MED ORDER — FERROUS SULFATE 325 (65 FE) MG PO TABS
325.0000 mg | ORAL_TABLET | Freq: Every day | ORAL | Status: DC
  Administered 2016-06-02: 325 mg via ORAL
  Filled 2016-06-02 (×4): qty 1

## 2016-06-02 MED ORDER — PRAVASTATIN SODIUM 40 MG PO TABS
20.0000 mg | ORAL_TABLET | Freq: Every day | ORAL | Status: DC
  Filled 2016-06-02 (×3): qty 1

## 2016-06-02 MED ORDER — LEVETIRACETAM 100 MG/ML PO SOLN
250.0000 mg | Freq: Two times a day (BID) | ORAL | Status: DC
  Administered 2016-06-02 (×2): 250 mg via ORAL
  Filled 2016-06-02 (×8): qty 2.5

## 2016-06-02 MED ORDER — IBUPROFEN 400 MG PO TABS: 600.0000 mg | ORAL_TABLET | Freq: Three times a day (TID) | ORAL | Status: DC | PRN

## 2016-06-02 MED ORDER — DIVALPROEX SODIUM 125 MG PO CSDR
650.0000 mg | DELAYED_RELEASE_CAPSULE | Freq: Two times a day (BID) | ORAL | Status: DC
  Administered 2016-06-02 (×2): 625 mg via ORAL
  Filled 2016-06-02 (×8): qty 5

## 2016-06-02 MED ORDER — ZOLPIDEM TARTRATE 5 MG PO TABS: 5.0000 mg | ORAL_TABLET | Freq: Every evening | ORAL | Status: DC | PRN

## 2016-06-02 MED ORDER — FUROSEMIDE 20 MG PO TABS
10.0000 mg | ORAL_TABLET | Freq: Two times a day (BID) | ORAL | Status: DC
  Administered 2016-06-02: 10 mg
  Filled 2016-06-02 (×7): qty 0.5

## 2016-06-02 NOTE — Progress Notes (Signed)
CSW spoke with Helene Kelp, Education officer, museum at [facility] to inform her of disposition recommendation- discharge back to facility. Helene Kelp agreeable and is expecting Pt's return.  Adriana Reams, LCSW Clinical Social Work [phone]

## 2016-06-02 NOTE — Discharge Instructions (Signed)
Follow up with your md as needed °

## 2016-06-02 NOTE — Consult Note (Signed)
Telepsych Consultation   Reason for Consult:  Aggressive Behavior Referring Physician:  EDP Patient Identification: Steven David MRN:  [id number] Principal Diagnosis: H/O stroke within last year  Diagnosis:   Patient Active Problem List   Diagnosis Date Noted  . Malfunction of percutaneous endoscopic gastrostomy (PEG) tube ([place]) [K94.23]   . Malnutrition of moderate degree [E44.0] 11/20/2014  . HCAP (healthcare-associated pneumonia) [J18.9] 11/19/2014  . Postoperative anemia due to acute blood loss [D62] 09/07/2014  . Seizure ([place]) [R56.9]   . Incarcerated inguinal hernia [K40.30] 09/01/2014  . Respiratory failure, acute ([place]) [J96.00] 09/01/2014  . Metabolic acidosis [E95.2] 09/01/2014  . Incarcerated hernia [K46.0] 09/01/2014  . Septic shock ([place]) [A41.9, R65.21] 09/01/2014  . AKI (acute kidney injury) ([place]) [N17.9]   . Protein-calorie malnutrition, severe ([place]) [E43] 04/13/2013  . Sepsis ([place]) [A41.9] 04/11/2013  . H/O stroke within last year [Z86.73] 04/11/2013  . Hypothyroidism [E03.9] 04/11/2013  . Pneumonia [J18.9] 04/11/2013  . Thrombocytopenia ([place]) [D69.6] 04/11/2013  . Schizophrenia ([place]) [F20.9] 04/11/2013  . Malnutrition ([place]) [E46] 04/11/2013  . SIRS (systemic inflammatory response syndrome) ([facility]) [R65.10] 04/11/2013    Total Time spent with patient: 30 minutes  Subjective:   Steven David is a 61 y.o. male patient admitted with aggression toward nursing home staff.  HPI: Per tele assessment note on chart written by Alycia Patten, [facility] Counselor:  Steven David is an 61 y.o. male referred to the ED due to aggressive behaviors towards staff and peers.  Steven David is not SI/HI or endorses AVH.  Pt was incoherent and collateral information had to be collected to gain a better understanding of what is presenting with the client. Staff sts the pt receives he medication management from [facility].  Staff sts the pt does not receive therapy.  Staff sts the pt has  one family member, a brother, who sts the pt cannot come and live with him.  The staff sts the pt is not homicidal, however, is just aggressive.  He does not have any pending charges or upcoming court dates.  The pt has a cognitive disability and cannot communicate in a coherent manner.  Staff sts the patient can return after he receives help.  The pt presented to the best of his ability. He was not not coherent to be assessed.  He presented in a hospital gown, was able to mumble hello.  Currently, he does not meet criteria for inpatient; however, he is recommended for an AM Psyche Eval to determine if adjusting his medication could be beneficial per Lindon Romp, NP.  Diagnosis: Schizophrenia  Today during tele psych consult:   I have reviewed and concur with HPI elements above, modified as follows:   Steven David is a 61 year old male who was brought [facility] the [facility] from the skilled nursing facility where he lives due to reports of being aggressive towards staff. Pt has a history of a stroke x 1 year, is unable to speak clearly, and is on a continuous feeding pump. Pt was not able to participate fully in the assessment and when he did try to speak, this writer was unable to determine what he was saying.   According to [facility] staff th Pt has not been aggressive toward them.   Discussed case with dr Dwyane Dee who recommends that Pt be discharged back to his skilled nursing facility where he receives 24 hr round the clock care and to follow up with the nursing facility medical staff to determine if Pt needs  any medication adjustments.    Past Psychiatric History: Seizure disorder, Hx of Schizophrenia  Risk to Self: Suicidal Ideation: No Suicidal Intent: No Is patient at risk for suicide?: No Suicidal Plan?: No Access to Means: No What has been your use of drugs/alcohol within the last 12 months?: None reported Risk to Others: Homicidal Ideation: No Thoughts of Harm to Others: No Current Homicidal  Intent: No Current Homicidal Plan: No Access to Homicidal Means: No Identified Victim: None History of harm to others?: No Assessment of Violence: On admission Violent Behavior Description: hitting staff, kicking, throwing things at staff Does patient have access to weapons?: No Criminal Charges Pending?: No Does patient have a court date: No Prior Inpatient Therapy: Prior Inpatient Therapy: No Prior Therapy Dates: No Prior Therapy Facilty/Provider(s): No Reason for Treatment: No Prior Outpatient Therapy: Prior Outpatient Therapy: No Prior Therapy Dates: Na Prior Therapy Facilty/Provider(s): NA Reason for Treatment: NA Does patient have an ACCT team?: No Does patient have Intensive In-House Services?  : No Does patient have [facility] services? : No Does patient have P4CC services?: No  Past Medical History:  Past Medical History:  Diagnosis Date  . Cognitive communication deficit   . Drug-induced Parkinsonism ([place])   . Dysphagia   . Hernia   . High cholesterol   . HOH (hard of hearing)   . Malnutrition ([place])   . Schizophrenia ([place])   . Seizures ([place])   . Thyroid disease   . TIA (transient ischemic attack)     Past Surgical History:  Procedure Laterality Date  . ESOPHAGOGASTRODUODENOSCOPY (EGD) WITH PROPOFOL N/A 09/08/2014   Procedure: ESOPHAGOGASTRODUODENOSCOPY (EGD) WITH PROPOFOL;  Surgeon: Georganna Skeans, MD;  Location: [place];  Service: Endoscopy;  Laterality: N/A;  . LAPAROTOMY N/A 09/01/2014   Procedure: EXPLORATORY LAPAROTOMY, SIGMOID COLECTOMY, SIGMOID COLOSTOMY, PRIMARY REPAIR OF STRANGULATED INGUINAL HERNIA.;  Surgeon: Rolm Bookbinder, MD;  Location: [place];  Service: General;  Laterality: N/A;  . LEG SURGERY    . PEG PLACEMENT N/A 09/08/2014   Procedure: PERCUTANEOUS ENDOSCOPIC GASTROSTOMY (PEG) PLACEMENT;  Surgeon: Georganna Skeans, MD;  Location: [place];  Service: Endoscopy;  Laterality: N/A;  . PEG PLACEMENT N/A 06/30/2015   Procedure: PERCUTANEOUS  ENDOSCOPIC GASTROSTOMY (PEG) REPLACEMENT;  Surgeon: Daneil Dolin, MD;  [facility];  Service: Endoscopy;  Laterality: N/A;  1100  . PEG PLACEMENT N/A 12/30/2015   Procedure: PERCUTANEOUS ENDOSCOPIC GASTROSTOMY (PEG) REPLACEMENT;  Surgeon: Daneil Dolin, MD;  [facility];  Service: Endoscopy;  Laterality: N/A;  . TESTICLE SURGERY     Family History:  Family History  Problem Relation Age of Onset  . Family history unknown: Yes   Family Psychiatric  History: unknown Social History:  History  Alcohol Use No     History  Drug Use No    Social History   Social History  . Marital status: Single    Spouse name: N/A  . Number of children: N/A  . Years of education: N/A   Social History Main Topics  . Smoking status: Current Every Day Smoker  . Smokeless tobacco: Never Used  . Alcohol use No  . Drug use: No  . Sexual activity: No   Other Topics Concern  . None   Social History Narrative  . None   Additional Social History:    Allergies:  No Known Allergies  Labs:  Results for orders placed or performed during the hospital encounter of 06/01/16 (from the past 48 hour(s))  Basic metabolic  panel     Status: None   Collection Time: 06/01/16  6:55 PM  Result Value Ref Range   Sodium 140 135 - 145 mmol/L   Potassium 3.9 3.5 - 5.1 mmol/L   Chloride 101 101 - 111 mmol/L   CO2 30 22 - 32 mmol/L   Glucose, Bld 87 65 - 99 mg/dL   BUN 19 6 - 20 mg/dL   Creatinine, Ser 0.85 0.61 - 1.24 mg/dL   Calcium 9.6 8.9 - 10.3 mg/dL   GFR calc non Af Amer >60 >60 mL/min   GFR calc Af Amer >60 >60 mL/min    Comment: (NOTE) The eGFR has been calculated using the CKD EPI equation. This calculation has not been validated in all clinical situations. eGFR's persistently <60 mL/min signify possible Chronic Kidney Disease.    Anion gap 9 5 - 15  CBC with Differential     Status: Abnormal   Collection Time: 06/01/16  6:55 PM  Result Value Ref Range   WBC 7.3 4.0 -  10.5 K/uL   RBC 4.73 4.22 - 5.81 MIL/uL   Hemoglobin 14.3 13.0 - 17.0 g/dL   HCT 44.0 39.0 - 52.0 %   MCV 93.0 78.0 - 100.0 fL   MCH 30.2 26.0 - 34.0 pg   MCHC 32.5 30.0 - 36.0 g/dL   RDW 15.3 11.5 - 15.5 %   Platelets 63 (L) 150 - 400 K/uL    Comment: SPECIMEN CHECKED FOR CLOTS PLATELET COUNT CONFIRMED BY SMEAR    Neutrophils Relative % 68 %   Neutro Abs 4.9 1.7 - 7.7 K/uL   Lymphocytes Relative 18 %   Lymphs Abs 1.3 0.7 - 4.0 K/uL   Monocytes Relative 11 %   Monocytes Absolute 0.8 0.1 - 1.0 K/uL   Eosinophils Relative 3 %   Eosinophils Absolute 0.2 0.0 - 0.7 K/uL   Basophils Relative 0 %   Basophils Absolute 0.0 0.0 - 0.1 K/uL  Ethanol     Status: None   Collection Time: 06/01/16  6:55 PM  Result Value Ref Range   Alcohol, Ethyl (B) <5 <5 mg/dL    Comment:        LOWEST DETECTABLE LIMIT FOR SERUM ALCOHOL IS 5 mg/dL FOR MEDICAL PURPOSES ONLY   Urine rapid drug screen (hosp performed)     Status: None   Collection Time: 06/01/16  6:55 PM  Result Value Ref Range   Opiates NONE DETECTED NONE DETECTED   Cocaine NONE DETECTED NONE DETECTED   Benzodiazepines NONE DETECTED NONE DETECTED   Amphetamines NONE DETECTED NONE DETECTED   Tetrahydrocannabinol NONE DETECTED NONE DETECTED   Barbiturates NONE DETECTED NONE DETECTED    Comment:        DRUG SCREEN FOR MEDICAL PURPOSES ONLY.  IF CONFIRMATION IS NEEDED FOR ANY PURPOSE, NOTIFY LAB WITHIN 5 DAYS.        LOWEST DETECTABLE LIMITS FOR URINE DRUG SCREEN Drug Class       Cutoff (ng/mL) Amphetamine      1000 Barbiturate      200 Benzodiazepine   768 Tricyclics       115 Opiates          300 Cocaine          300 THC              50   Valproic acid level     Status: Abnormal   Collection Time: 06/01/16  7:01 PM  Result Value Ref Range  Valproic Acid Lvl 21 (L) 50.0 - 100.0 ug/mL    Current Facility-Administered Medications  Medication Dose Route Frequency Provider Last Rate Last Dose  . acetaminophen (TYLENOL)  tablet 650 mg  650 mg Oral B9T PRN Delora Fuel, MD      . alum & mag hydroxide-simeth (MAALOX/MYLANTA) 200-200-20 MG/5ML suspension 30 mL  30 mL Oral J0Z PRN Delora Fuel, MD      . divalproex (DEPAKOTE SPRINKLE) capsule 625 mg  009 mg Oral Q33A Delora Fuel, MD   076 mg at 06/02/16 0920  . feeding supplement (JEVITY 1.5 CAL/FIBER) liquid 1,000 mL  1,000 mL Per Tube Continuous Delora Fuel, MD 226 mL/hr at 06/02/16 0207 1,000 mL at 06/02/16 0207  . ferrous sulfate tablet 325 mg  325 mg Oral Q breakfast Delora Fuel, MD   333 mg at 06/02/16 5456  . furosemide (LASIX) tablet 10 mg  10 mg Per Tube BID Delora Fuel, MD   10 mg at 06/02/16 2563  . haloperidol (HALDOL) tablet 10 mg  10 mg Per Tube QPM Delora Fuel, MD      . ibuprofen (ADVIL,MOTRIN) tablet 600 mg  600 mg Oral S9H PRN Delora Fuel, MD      . levETIRAcetam (KEPPRA) 100 MG/ML solution 250 mg  734 mg Oral BID Delora Fuel, MD   287 mg at 06/02/16 0919  . loratadine (CLARITIN) tablet 10 mg  10 mg Per Tube Daily Delora Fuel, MD   10 mg at 06/02/16 0920  . ondansetron (ZOFRAN) tablet 4 mg  4 mg Oral G8T PRN Delora Fuel, MD      . pravastatin (PRAVACHOL) tablet 20 mg  20 mg Oral [id number] Delora Fuel, MD      . zolpidem [facility]) tablet 5 mg  5 mg Oral QHS PRN Delora Fuel, MD       Current Outpatient Prescriptions  Medication Sig Dispense Refill  . acetaminophen (TYLENOL) 325 MG tablet Place 650 mg into feeding tube every 6 (six) hours as needed for moderate pain or fever.    . divalproex (DEPAKOTE SPRINKLE) 125 MG capsule Take 5 capsules (625 mg total) by mouth every 12 (twelve) hours. (Patient taking differently: 650 mg every 12 (twelve) hours. Per Tube*)    . ferrous sulfate 325 (65 FE) MG tablet 325 mg daily with breakfast. Per tube    . furosemide (LASIX) 20 MG tablet Place 10 mg into feeding tube 2 (two) times daily.     Marland Kitchen guaifenesin (HUMIBID E) 400 MG TABS tablet 400 mg every 4 (four) hours. Per tube    . haloperidol (HALDOL) 10 MG  tablet Place 1 tablet (10 mg total) into feeding tube every evening.    . haloperidol decanoate (HALDOL DECANOATE) 100 MG/ML injection Inject 100 mg into the muscle every 28 (twenty-eight) days. Administered by [facility]    . levETIRAcetam (KEPPRA) 250 MG tablet 250 mg 2 (two) times daily. Per tube    . loratadine (CLARITIN) 10 MG tablet Place 1 tablet (10 mg total) into feeding tube daily.    Marland Kitchen lovastatin (MEVACOR) 20 MG tablet Place 1 tablet (20 mg total) into feeding tube daily.    . Nutritional Supplements (FEEDING SUPPLEMENT, JEVITY 1.5 CAL/FIBER,) LIQD Place 1,000 mLs into feeding tube continuous. Initiate nocturnal feedings of Jevity 1.5 @ 100 ml/hr via PEG over 14 hour period      Musculoskeletal: Unable to assess: camera  Psychiatric Specialty Exam: Physical Exam  Review of Systems  Psychiatric/Behavioral: Positive for depression. Negative for  hallucinations, memory loss, substance abuse and suicidal ideas. The patient is not nervous/anxious and does not have insomnia.   All other systems reviewed and are negative.   Blood pressure 99/68, pulse 73, temperature 97.5 F (36.4 C), temperature source Axillary, resp. rate 16, weight 63 kg (139 lb), SpO2 97 %.Body mass index is 19.94 kg/m.  General Appearance: Disheveled  Eye Contact:  Poor  Speech:  Garbled and unable to understand  Volume:  too garbled to understand  Mood:  Depressed  Affect:  Congruent  Thought Process:  NA  Orientation:  Other:  unable to determine  Thought Content:  unable to determine  Suicidal Thoughts:  No  Homicidal Thoughts:  No  Memory:  unable to determine  Judgement:  Other:  unable to determine  Insight:  unable to determine  Psychomotor Activity:  Decreased  Concentration:  Attention Span: Poor  Recall:  unable to determine  Fund of Knowledge:  unable to determine  Language:  Poor  Akathisia:  No  Handed:  Right  AIMS (if indicated):     Assets:  Financial Resources/Insurance Housing Social  Support  ADL's:  Impaired  Cognition:  Impaired,  Severe  Sleep:        Treatment Plan Summary: Discharge Pt back to [facility] skilled nursing facility where he resides Follow up with medical staff at [place] for any medication or treatment adjustments that may need to be instituted  Disposition: No evidence of imminent risk to self or others at present.   Patient does not meet criteria for psychiatric inpatient admission.  Ethelene Hal, NP 06/02/2016 9:48 AM

## 2016-06-20 ENCOUNTER — Emergency Department (HOSPITAL_COMMUNITY)
Admission: EM | Admit: 2016-06-20 | Discharge: 2016-06-20 | Disposition: A | Discharge status: Home / Self Care | Payer: Medicare Other | Attending: Emergency Medicine | Admitting: Emergency Medicine

## 2016-06-20 ENCOUNTER — Emergency Department (HOSPITAL_COMMUNITY): Payer: Medicare Other

## 2016-06-20 ENCOUNTER — Encounter (HOSPITAL_COMMUNITY): Payer: Self-pay | Admitting: *Deleted

## 2016-06-20 DIAGNOSIS — Z79899 Other long term (current) drug therapy: Secondary | ICD-10-CM | POA: Diagnosis not present

## 2016-06-20 DIAGNOSIS — E039 Hypothyroidism, unspecified: Secondary | ICD-10-CM | POA: Diagnosis not present

## 2016-06-20 DIAGNOSIS — F17200 Nicotine dependence, unspecified, uncomplicated: Secondary | ICD-10-CM | POA: Diagnosis not present

## 2016-06-20 DIAGNOSIS — Z743 Need for continuous supervision: Secondary | ICD-10-CM | POA: Diagnosis not present

## 2016-06-20 DIAGNOSIS — R4182 Altered mental status, unspecified: Secondary | ICD-10-CM | POA: Insufficient documentation

## 2016-06-20 DIAGNOSIS — R279 Unspecified lack of coordination: Secondary | ICD-10-CM | POA: Diagnosis not present

## 2016-06-20 LAB — BASIC METABOLIC PANEL
Anion gap: 9 (ref 5–15)
BUN: 21 mg/dL — ABNORMAL HIGH (ref 6–20)
CO2: 29 mmol/L (ref 22–32)
Calcium: 9.2 mg/dL (ref 8.9–10.3)
Chloride: 103 mmol/L (ref 101–111)
Creatinine, Ser: 0.73 mg/dL (ref 0.61–1.24)
GFR calc Af Amer: >60 mL/min (ref >60)
GFR calc non Af Amer: >60 mL/min (ref >60)
Glucose, Bld: 78 mg/dL (ref 65–99)
Potassium: 3.8 mmol/L (ref 3.5–5.1)
Sodium: 141 mmol/L (ref 135–145)

## 2016-06-20 LAB — CBC WITH DIFFERENTIAL/PLATELET
Basophils Absolute: 0 10*3/uL (ref 0.0–0.1)
Basophils Relative: 0 %
Eosinophils Absolute: 0.2 10*3/uL (ref 0.0–0.7)
Eosinophils Relative: 3 %
HCT: 39 % (ref 39.0–52.0)
Hemoglobin: 12.7 g/dL — ABNORMAL LOW (ref 13.0–17.0)
Lymphocytes Relative: 20 %
Lymphs Abs: 1.5 10*3/uL (ref 0.7–4.0)
MCH: 29.7 pg (ref 26.0–34.0)
MCHC: 32.6 g/dL (ref 30.0–36.0)
MCV: 91.3 fL (ref 78.0–100.0)
Monocytes Absolute: 0.7 10*3/uL (ref 0.1–1.0)
Monocytes Relative: 10 %
Neutro Abs: 5 10*3/uL (ref 1.7–7.7)
Neutrophils Relative %: 67 %
Platelets: 61 10*3/uL — ABNORMAL LOW (ref 150–400)
RBC: 4.27 MIL/uL (ref 4.22–5.81)
RDW: 15.1 % (ref 11.5–15.5)
WBC: 7.4 10*3/uL (ref 4.0–10.5)

## 2016-06-20 LAB — URINALYSIS, ROUTINE W REFLEX MICROSCOPIC
Bilirubin Urine: NEGATIVE
Glucose, UA: NEGATIVE mg/dL
Hgb urine dipstick: NEGATIVE
Ketones, ur: 5 mg/dL — AB
Leukocytes, UA: NEGATIVE
Nitrite: NEGATIVE
Protein, ur: NEGATIVE mg/dL
Specific Gravity, Urine: 1.018 (ref 1.005–1.030)
pH: 6 (ref 5.0–8.0)

## 2016-06-20 NOTE — ED Provider Notes (Signed)
[facility] DEPT Provider Note   CSN: [id number] Arrival date & time: 06/20/16  1643  By signing my name below, I, Levester Fresh, attest that this documentation has been prepared under the direction and in the presence of Virgel Manifold, MD . Electronically Signed: Levester Fresh, Scribe. 06/20/2016. 8:02 PM.   History   Chief Complaint Chief Complaint  Patient presents with  . Altered Mental Status    HPI Comments Steven David is a 61 y.o. male with a PMHx significant for schizophrenia and cognitive communication deficit, who presents to the Emergency Department due to altered mental status x1day.  Pt requested to go to [facility] stating that he felt abnormal and that he had pneumonia.  They offered him a CXR and labs, and he became increasingly agitated, declining evaluation and stating that they would "falsify" his results.  [facility] sent him here for further work-up.  Patient has limited ability to communicate at baseline, but will usually engage in conversation per care facility staff.  On exam, he refuses to answer questions or communicate.  Per EMS, he was sitting up and smoking a cigarette when they picked him up from [place].  He was administered no medications en route.   The history is provided by the patient, medical records, a caregiver and the EMS personnel. No language interpreter was used.    Past Medical History:  Diagnosis Date  . Cognitive communication deficit   . Drug-induced Parkinsonism ([place])   . Dysphagia   . Hernia   . High cholesterol   . HOH (hard of hearing)   . Malnutrition ([place])   . Schizophrenia ([place])   . Seizures ([place])   . Thyroid disease   . TIA (transient ischemic attack)     Patient Active Problem List   Diagnosis Date Noted  . Malfunction of percutaneous endoscopic gastrostomy (PEG) tube ([place])   . Malnutrition of moderate degree 11/20/2014  . HCAP (healthcare-associated pneumonia) 11/19/2014  .  Postoperative anemia due to acute blood loss 09/07/2014  . Seizure ([place])   . Incarcerated inguinal hernia 09/01/2014  . Respiratory failure, acute ([place]) 09/01/2014  . Metabolic acidosis 00/17/4944  . Incarcerated hernia 09/01/2014  . Septic shock ([place]) 09/01/2014  . AKI (acute kidney injury) ([place])   . Protein-calorie malnutrition, severe ([place]) 04/13/2013  . Sepsis ([place]) 04/11/2013  . H/O stroke within last year 04/11/2013  . Hypothyroidism 04/11/2013  . Pneumonia 04/11/2013  . Thrombocytopenia ([place]) 04/11/2013  . Schizophrenia ([place]) 04/11/2013  . Malnutrition ([place]) 04/11/2013  . SIRS (systemic inflammatory response syndrome) ([place]) 04/11/2013    Past Surgical History:  Procedure Laterality Date  . ESOPHAGOGASTRODUODENOSCOPY (EGD) WITH PROPOFOL N/A 09/08/2014   Procedure: ESOPHAGOGASTRODUODENOSCOPY (EGD) WITH PROPOFOL;  Surgeon: Georganna Skeans, MD;  Location: [place];  Service: Endoscopy;  Laterality: N/A;  . LAPAROTOMY N/A 09/01/2014   Procedure: EXPLORATORY LAPAROTOMY, SIGMOID COLECTOMY, SIGMOID COLOSTOMY, PRIMARY REPAIR OF STRANGULATED INGUINAL HERNIA.;  Surgeon: Rolm Bookbinder, MD;  Location: [place];  Service: General;  Laterality: N/A;  . LEG SURGERY    . PEG PLACEMENT N/A 09/08/2014   Procedure: PERCUTANEOUS ENDOSCOPIC GASTROSTOMY (PEG) PLACEMENT;  Surgeon: Georganna Skeans, MD;  Location: [place];  Service: Endoscopy;  Laterality: N/A;  . PEG PLACEMENT N/A 06/30/2015   Procedure: PERCUTANEOUS ENDOSCOPIC GASTROSTOMY (PEG) REPLACEMENT;  Surgeon: Daneil Dolin, MD;  [facility];  Service: Endoscopy;  Laterality: N/A;  1100  . PEG PLACEMENT N/A 12/30/2015   Procedure: PERCUTANEOUS ENDOSCOPIC GASTROSTOMY (PEG) REPLACEMENT;  Surgeon: Daneil Dolin, MD;  [facility];  Service: Endoscopy;  Laterality: N/A;  . TESTICLE SURGERY         Home Medications    Prior to Admission medications   Medication Sig Start Date End Date Taking? Authorizing  Provider  acetaminophen (TYLENOL) 325 MG tablet Place 650 mg into feeding tube every 6 (six) hours as needed for moderate pain or fever.    [provider]  divalproex (DEPAKOTE SPRINKLE) 125 MG capsule Take 5 capsules (625 mg total) by mouth every 12 (twelve) hours. Patient taking differently: 650 mg every 12 (twelve) hours. Per Tube* 11/21/14   Sinda Du, MD  ferrous sulfate 325 (65 FE) MG tablet 325 mg daily with breakfast. Per tube    [provider]  furosemide (LASIX) 20 MG tablet Place 10 mg into feeding tube 2 (two) times daily.     [provider]  guaifenesin (HUMIBID E) 400 MG TABS tablet 400 mg every 4 (four) hours. Per tube    [provider]  haloperidol (HALDOL) 10 MG tablet Place 1 tablet (10 mg total) into feeding tube every evening. 09/10/14   Nat Christen, PA-C  haloperidol decanoate (HALDOL DECANOATE) 100 MG/ML injection Inject 100 mg into the muscle every 28 (twenty-eight) days. Administered by [facility]    [provider]  levETIRAcetam (KEPPRA) 250 MG tablet 250 mg 2 (two) times daily. Per tube    [provider]  loratadine (CLARITIN) 10 MG tablet Place 1 tablet (10 mg total) into feeding tube daily. 09/10/14   Nat Christen, PA-C  lovastatin (MEVACOR) 20 MG tablet Place 1 tablet (20 mg total) into feeding tube daily. 09/10/14   Nat Christen, PA-C  Nutritional Supplements (FEEDING SUPPLEMENT, JEVITY 1.5 CAL/FIBER,) LIQD Place 1,000 mLs into feeding tube continuous. Initiate nocturnal feedings of Jevity 1.5 @ 100 ml/hr via PEG over 14 hour period 09/10/14   Nat Christen, PA-C    Family History Family History  Problem Relation Age of Onset  . Family history unknown: Yes    Social History Social History  Substance Use Topics  . Smoking status: Current Every Day Smoker  . Smokeless tobacco: Never Used  . Alcohol use No     Allergies   Patient has no known allergies.   Review of Systems Review of Systems    Unable to perform ROS: Mental status change  Psychiatric/Behavioral: Positive for agitation and behavioral problems.   Physical Exam Updated Vital Signs BP 106/61 (BP Location: Right Arm)   Pulse 79   Temp 97.7 F (36.5 C) (Oral)   Resp 18   Ht 5\' 10"  (1.778 m)   Wt 139 lb (63 kg)   SpO2 97%   BMI 19.94 kg/m   Physical Exam  Constitutional:  Appears to be sleeping. Resting tremor.  In no acute distress.  Pt not following commands.  HENT:  Head: Atraumatic.  Eyes: EOM are normal.  Cardiovascular: Normal rate, regular rhythm, normal heart sounds and intact distal pulses.   Pulmonary/Chest: Effort normal and breath sounds normal. No respiratory distress.  Abdominal: Soft. He exhibits no distension. There is no tenderness.  Musculoskeletal: Normal range of motion.  Neurological:  Withdraws extremities due to pain.  After giving nail bed pressure, pt withdrew his hand.  When I attempted again, pt would pull his hand away before I could reattempt.  Skin: Skin is warm and dry.  Nursing note and vitals reviewed.  ED Treatments / Results  DIAGNOSTIC STUDIES: Oxygen Saturation is 100% on room air, normal by my interpretation.    COORDINATION OF CARE: 5:19 PM Discussed treatment plan with pt at bedside and pt agreed to plan.  Labs (all labs ordered are listed, but only abnormal results are displayed) Labs Reviewed  CBC WITH DIFFERENTIAL/PLATELET - Abnormal; Notable for the following:       Result Value   Hemoglobin 12.7 (*)    Platelets 61 (*)    All other components within normal limits  BASIC METABOLIC PANEL - Abnormal; Notable for the following:    BUN 21 (*)    All other components within normal limits  URINALYSIS, ROUTINE W REFLEX MICROSCOPIC - Abnormal; Notable for the following:    Ketones, ur 5 (*)    All other components within normal limits    EKG  EKG Interpretation None       Radiology Dg Chest Portable 1 View  Result Date: 06/20/2016 CLINICAL DATA:   61 year old with current history of schizophrenia, presenting with acute mental status changes. Current smoker. EXAM: PORTABLE CHEST 1 VIEW COMPARISON:  11/20/2014, 11/19/2014, 825 1,016 and earlier. FINDINGS: Cardiac silhouette upper normal in size to slightly enlarged for AP technique, unchanged. Thoracic aorta mildly atherosclerotic, unchanged. Lungs clear. Bronchovascular markings normal. Pulmonary vascularity normal. No visible pleural effusions. No pneumothorax. IMPRESSION: Stable borderline heart size.  No acute cardiopulmonary disease. Electronically Signed   By: Evangeline Dakin M.D.   On: 06/20/2016 18:21    Procedures Procedures (including critical care time)  Medications Ordered in ED Medications - No data to display   Initial Impression / Assessment and Plan / ED Course  I have reviewed the triage vital signs and the nursing notes.  Pertinent labs & imaging results that were available during my care of the patient were reviewed by me and considered in my medical decision making (see chart for details).     61yM with change in mental status which I believe is psychiatric and not medical. Known schizophrenia. Has established care. He doesn't seem like he is significan danger to himself or others. May need meds adjusted.   Final Clinical Impressions(s) / ED Diagnoses   Final diagnoses:  Altered mental status, unspecified altered mental status type   I personally preformed the services scribed in my presence. The recorded information has been reviewed is accurate. Virgel Manifold, MD.   New Prescriptions New Prescriptions   No medications on file     Virgel Manifold, MD 06/30/16 [id number]

## 2016-06-20 NOTE — ED Triage Notes (Signed)
Per EMS facility called because patient was "yelling out" and not "his normal self", patient stated he wanted to go to [facility] mental health per staff. Staff sent patient to be evaluated.

## 2016-06-21 DIAGNOSIS — F209 Schizophrenia, unspecified: Secondary | ICD-10-CM | POA: Diagnosis not present

## 2016-07-09 DIAGNOSIS — R1314 Dysphagia, pharyngoesophageal phase: Secondary | ICD-10-CM | POA: Diagnosis not present

## 2016-07-09 DIAGNOSIS — Z8673 Personal history of transient ischemic attack (TIA), and cerebral infarction without residual deficits: Secondary | ICD-10-CM | POA: Diagnosis not present

## 2016-07-09 DIAGNOSIS — R1311 Dysphagia, oral phase: Secondary | ICD-10-CM | POA: Diagnosis not present

## 2016-07-09 DIAGNOSIS — R471 Dysarthria and anarthria: Secondary | ICD-10-CM | POA: Diagnosis not present

## 2016-07-11 DIAGNOSIS — R1314 Dysphagia, pharyngoesophageal phase: Secondary | ICD-10-CM | POA: Diagnosis not present

## 2016-07-11 DIAGNOSIS — Z8673 Personal history of transient ischemic attack (TIA), and cerebral infarction without residual deficits: Secondary | ICD-10-CM | POA: Diagnosis not present

## 2016-07-11 DIAGNOSIS — R471 Dysarthria and anarthria: Secondary | ICD-10-CM | POA: Diagnosis not present

## 2016-07-11 DIAGNOSIS — R1311 Dysphagia, oral phase: Secondary | ICD-10-CM | POA: Diagnosis not present

## 2016-07-12 DIAGNOSIS — Z8673 Personal history of transient ischemic attack (TIA), and cerebral infarction without residual deficits: Secondary | ICD-10-CM | POA: Diagnosis not present

## 2016-07-12 DIAGNOSIS — R1311 Dysphagia, oral phase: Secondary | ICD-10-CM | POA: Diagnosis not present

## 2016-07-12 DIAGNOSIS — R1314 Dysphagia, pharyngoesophageal phase: Secondary | ICD-10-CM | POA: Diagnosis not present

## 2016-07-12 DIAGNOSIS — R471 Dysarthria and anarthria: Secondary | ICD-10-CM | POA: Diagnosis not present

## 2016-07-13 DIAGNOSIS — Z8673 Personal history of transient ischemic attack (TIA), and cerebral infarction without residual deficits: Secondary | ICD-10-CM | POA: Diagnosis not present

## 2016-07-13 DIAGNOSIS — R471 Dysarthria and anarthria: Secondary | ICD-10-CM | POA: Diagnosis not present

## 2016-07-13 DIAGNOSIS — R1311 Dysphagia, oral phase: Secondary | ICD-10-CM | POA: Diagnosis not present

## 2016-07-13 DIAGNOSIS — R1314 Dysphagia, pharyngoesophageal phase: Secondary | ICD-10-CM | POA: Diagnosis not present

## 2016-07-14 DIAGNOSIS — R471 Dysarthria and anarthria: Secondary | ICD-10-CM | POA: Diagnosis not present

## 2016-07-14 DIAGNOSIS — Z8673 Personal history of transient ischemic attack (TIA), and cerebral infarction without residual deficits: Secondary | ICD-10-CM | POA: Diagnosis not present

## 2016-07-14 DIAGNOSIS — R1314 Dysphagia, pharyngoesophageal phase: Secondary | ICD-10-CM | POA: Diagnosis not present

## 2016-07-14 DIAGNOSIS — R1311 Dysphagia, oral phase: Secondary | ICD-10-CM | POA: Diagnosis not present

## 2016-07-17 ENCOUNTER — Other Ambulatory Visit (HOSPITAL_COMMUNITY): Payer: Self-pay | Admitting: Hospice and Palliative Medicine

## 2016-07-17 DIAGNOSIS — R1310 Dysphagia, unspecified: Secondary | ICD-10-CM

## 2016-07-17 DIAGNOSIS — Z8673 Personal history of transient ischemic attack (TIA), and cerebral infarction without residual deficits: Secondary | ICD-10-CM | POA: Diagnosis not present

## 2016-07-17 DIAGNOSIS — R1311 Dysphagia, oral phase: Secondary | ICD-10-CM | POA: Diagnosis not present

## 2016-07-17 DIAGNOSIS — R1314 Dysphagia, pharyngoesophageal phase: Secondary | ICD-10-CM | POA: Diagnosis not present

## 2016-07-17 DIAGNOSIS — R471 Dysarthria and anarthria: Secondary | ICD-10-CM | POA: Diagnosis not present

## 2016-07-18 DIAGNOSIS — Z8673 Personal history of transient ischemic attack (TIA), and cerebral infarction without residual deficits: Secondary | ICD-10-CM | POA: Diagnosis not present

## 2016-07-18 DIAGNOSIS — R1314 Dysphagia, pharyngoesophageal phase: Secondary | ICD-10-CM | POA: Diagnosis not present

## 2016-07-18 DIAGNOSIS — R471 Dysarthria and anarthria: Secondary | ICD-10-CM | POA: Diagnosis not present

## 2016-07-18 DIAGNOSIS — R1311 Dysphagia, oral phase: Secondary | ICD-10-CM | POA: Diagnosis not present

## 2016-07-19 DIAGNOSIS — R471 Dysarthria and anarthria: Secondary | ICD-10-CM | POA: Diagnosis not present

## 2016-07-19 DIAGNOSIS — R1314 Dysphagia, pharyngoesophageal phase: Secondary | ICD-10-CM | POA: Diagnosis not present

## 2016-07-19 DIAGNOSIS — R1311 Dysphagia, oral phase: Secondary | ICD-10-CM | POA: Diagnosis not present

## 2016-07-19 DIAGNOSIS — Z8673 Personal history of transient ischemic attack (TIA), and cerebral infarction without residual deficits: Secondary | ICD-10-CM | POA: Diagnosis not present

## 2016-07-20 DIAGNOSIS — Z8673 Personal history of transient ischemic attack (TIA), and cerebral infarction without residual deficits: Secondary | ICD-10-CM | POA: Diagnosis not present

## 2016-07-20 DIAGNOSIS — R1311 Dysphagia, oral phase: Secondary | ICD-10-CM | POA: Diagnosis not present

## 2016-07-20 DIAGNOSIS — R1314 Dysphagia, pharyngoesophageal phase: Secondary | ICD-10-CM | POA: Diagnosis not present

## 2016-07-20 DIAGNOSIS — R471 Dysarthria and anarthria: Secondary | ICD-10-CM | POA: Diagnosis not present

## 2016-07-21 DIAGNOSIS — R1314 Dysphagia, pharyngoesophageal phase: Secondary | ICD-10-CM | POA: Diagnosis not present

## 2016-07-21 DIAGNOSIS — Z8673 Personal history of transient ischemic attack (TIA), and cerebral infarction without residual deficits: Secondary | ICD-10-CM | POA: Diagnosis not present

## 2016-07-21 DIAGNOSIS — R1311 Dysphagia, oral phase: Secondary | ICD-10-CM | POA: Diagnosis not present

## 2016-07-21 DIAGNOSIS — R471 Dysarthria and anarthria: Secondary | ICD-10-CM | POA: Diagnosis not present

## 2016-07-24 DIAGNOSIS — R1314 Dysphagia, pharyngoesophageal phase: Secondary | ICD-10-CM | POA: Diagnosis not present

## 2016-07-24 DIAGNOSIS — R1311 Dysphagia, oral phase: Secondary | ICD-10-CM | POA: Diagnosis not present

## 2016-07-24 DIAGNOSIS — R471 Dysarthria and anarthria: Secondary | ICD-10-CM | POA: Diagnosis not present

## 2016-07-24 DIAGNOSIS — Z8673 Personal history of transient ischemic attack (TIA), and cerebral infarction without residual deficits: Secondary | ICD-10-CM | POA: Diagnosis not present

## 2016-07-25 ENCOUNTER — Ambulatory Visit (HOSPITAL_COMMUNITY)
Admission: RE | Admit: 2016-07-25 | Discharge: 2016-07-25 | Disposition: A | Discharge status: Home / Self Care | Payer: Medicare Other | Source: Ambulatory Visit | Attending: Hospice and Palliative Medicine | Admitting: Hospice and Palliative Medicine

## 2016-07-25 DIAGNOSIS — Z8673 Personal history of transient ischemic attack (TIA), and cerebral infarction without residual deficits: Secondary | ICD-10-CM | POA: Diagnosis not present

## 2016-07-25 DIAGNOSIS — R1310 Dysphagia, unspecified: Secondary | ICD-10-CM | POA: Diagnosis not present

## 2016-07-25 DIAGNOSIS — R1311 Dysphagia, oral phase: Secondary | ICD-10-CM | POA: Diagnosis not present

## 2016-07-25 DIAGNOSIS — R471 Dysarthria and anarthria: Secondary | ICD-10-CM | POA: Diagnosis not present

## 2016-07-25 DIAGNOSIS — R1314 Dysphagia, pharyngoesophageal phase: Secondary | ICD-10-CM | POA: Diagnosis not present

## 2016-07-25 NOTE — Progress Notes (Signed)
Modified Barium Swallow Progress Note  Patient Details  Name: Steven David MRN: [id number] Date of Birth: [birth year]  Today's Date: 07/25/2016  Modified Barium Swallow completed.  Full report located under Chart Review in the Imaging Section.  Brief recommendations include the following:  Clinical Impression  Oropharyngeal swallow function appears somewhat worse compared to documentation of MBS 09/2014. Lingual pumping, reduced posterior propulsion and delayed transit characterize oral phase. Pharyngeal phase marked by severe sensorimotor deficits including significantly decreased hyolaryngeal forward excursion and elevation, mild and inefficient epiglottic deflection. Laryngeal penetration before the swallow with thin, nectar and honey thick due to decreased sensation resulting in aspiration during the swallow with ineffective and infrequent reflexive cough. Pt unble to clear penetrates/aspirates with cue to cough due to weakness. Moderate-maximum vallecular residue due to poor epiglottic infection that remained during the study due to weakness of second swallows. Chin tuck attempted and not effective. Esophagus scanned revealing what appeared to be mild stasis (MBS does not diagnose below the level of the UES). Pt is not safe with any solids or liquids and NPO recommended. Pt repeated "I eats soft foods and I don't cough", was visibly upset with results. Pt is unable to comprehend results and overall consequences, however his desire to eat/drink is very strong. CNA reports he sneaks coffee and pt states he eats Doritos and drinks Pepsi. Thin water following oral care would be safer choice of liquid. SLP reviewed chart and could not find documentation in [facility] system of admission for pneumonia. He is at greatest risk if health deteriorates from a different source and pt's body unable to compensate (appears that he has been handling penetration/aspiration?) MD/pt/POA can discuss current results and make  decision re: po's.         Swallow Evaluation Recommendations       SLP Diet Recommendations: NPO       Medication Administration: Via alternative means               Oral Care Recommendations: Oral care QID        [name] 07/25/2016,2:12 PM   [name].Ed [employer] [phone]

## 2016-07-26 DIAGNOSIS — Z8673 Personal history of transient ischemic attack (TIA), and cerebral infarction without residual deficits: Secondary | ICD-10-CM | POA: Diagnosis not present

## 2016-07-26 DIAGNOSIS — R1314 Dysphagia, pharyngoesophageal phase: Secondary | ICD-10-CM | POA: Diagnosis not present

## 2016-07-26 DIAGNOSIS — R471 Dysarthria and anarthria: Secondary | ICD-10-CM | POA: Diagnosis not present

## 2016-07-26 DIAGNOSIS — R1311 Dysphagia, oral phase: Secondary | ICD-10-CM | POA: Diagnosis not present

## 2016-07-28 DIAGNOSIS — F209 Schizophrenia, unspecified: Secondary | ICD-10-CM | POA: Diagnosis not present

## 2016-07-30 DIAGNOSIS — F918 Other conduct disorders: Secondary | ICD-10-CM | POA: Diagnosis not present

## 2016-07-30 DIAGNOSIS — D696 Thrombocytopenia, unspecified: Secondary | ICD-10-CM | POA: Diagnosis not present

## 2016-07-30 DIAGNOSIS — E039 Hypothyroidism, unspecified: Secondary | ICD-10-CM | POA: Diagnosis not present

## 2016-07-30 DIAGNOSIS — R1312 Dysphagia, oropharyngeal phase: Secondary | ICD-10-CM | POA: Diagnosis not present

## 2016-08-01 DIAGNOSIS — R161 Splenomegaly, not elsewhere classified: Secondary | ICD-10-CM | POA: Diagnosis not present

## 2016-08-03 DIAGNOSIS — B351 Tinea unguium: Secondary | ICD-10-CM | POA: Diagnosis not present

## 2016-08-03 DIAGNOSIS — M79674 Pain in right toe(s): Secondary | ICD-10-CM | POA: Diagnosis not present

## 2016-08-03 DIAGNOSIS — M79675 Pain in left toe(s): Secondary | ICD-10-CM | POA: Diagnosis not present

## 2016-08-21 DIAGNOSIS — R1312 Dysphagia, oropharyngeal phase: Secondary | ICD-10-CM | POA: Diagnosis not present

## 2016-08-21 DIAGNOSIS — F918 Other conduct disorders: Secondary | ICD-10-CM | POA: Diagnosis not present

## 2016-08-21 DIAGNOSIS — F209 Schizophrenia, unspecified: Secondary | ICD-10-CM | POA: Diagnosis not present

## 2016-08-21 DIAGNOSIS — H9190 Unspecified hearing loss, unspecified ear: Secondary | ICD-10-CM | POA: Diagnosis not present

## 2016-08-22 ENCOUNTER — Encounter: Payer: Self-pay | Admitting: Diagnostic Neuroimaging

## 2016-08-22 ENCOUNTER — Ambulatory Visit (INDEPENDENT_AMBULATORY_CARE_PROVIDER_SITE_OTHER): Payer: Medicare Other | Admitting: Diagnostic Neuroimaging

## 2016-08-22 DIAGNOSIS — G40909 Epilepsy, unspecified, not intractable, without status epilepticus: Secondary | ICD-10-CM | POA: Diagnosis not present

## 2016-08-22 DIAGNOSIS — G251 Drug-induced tremor: Secondary | ICD-10-CM | POA: Diagnosis not present

## 2016-08-22 NOTE — Progress Notes (Signed)
[facility]  PATIENT: Steven David DOB: 06-27-1955  REFERRING CLINICIAN:   HISTORY FROM: chart review and patient and care giver REASON FOR VISIT: follow up    HISTORICAL  CHIEF COMPLAINT:  Chief Complaint  Patient presents with  . Seizures    Here with caregiver from nsg. facility [facility], phone# [phone]).  Caregiver is unsure if pt. has had sz. activity since last ov, and it is difficult to understand pt./fim  . Drug-Induced Parkinsonism    HISTORY OF PRESENT ILLNESS:   UPDATE 08/22/16: Since last visit, no definite seizures. Tremors stable / improved. No other new issues.  UPDATE 08/18/15: Since last visit, no seizures. Has had some weight loss. No other new issues.   UPDATE 01/25/15: Since last visit, was in hospital for aspiration pneumonia, sepsis, in Nov 2016. Also since last visit, pt now on LEV 250mg  daily + increase VPA of 625mg  BID. No reported seizures.  PRIOR HPI (09/25/14): 61 year old male here for follow-up of seizure. Patient has history of schizophrenia, hypothyroidism, previously living in a group home, then admitted to the hospital for incarcerated inguinal hernia status post sigmoid colectomy, sigmoid colostomy, repair of strangulated hernia. Patient had witnessed seizure postoperatively. Patient was started on Keppra and then transitioned to Depakote. Since discharge no further seizures.   REVIEW OF SYSTEMS: Full 14 system review of systems performed and negative: except as per HPI.    ALLERGIES: No Known Allergies  HOME MEDICATIONS: Outpatient Medications Prior to Visit  Medication Sig Dispense Refill  . acetaminophen (TYLENOL) 325 MG tablet Place 650 mg into feeding tube every 6 (six) hours as needed for moderate pain or fever.    . divalproex (DEPAKOTE SPRINKLE) 125 MG capsule Take 5 capsules (625 mg total) by mouth every 12 (twelve) hours. (Patient taking differently: 650 mg every 12 (twelve) hours. Per Tube*)    .  ferrous sulfate 325 (65 FE) MG tablet 325 mg daily with breakfast. Per tube    . furosemide (LASIX) 20 MG tablet Place 10 mg into feeding tube 2 (two) times daily.     Marland Kitchen guaifenesin (HUMIBID E) 400 MG TABS tablet 400 mg every 4 (four) hours. Per tube    . haloperidol (HALDOL) 10 MG tablet Place 1 tablet (10 mg total) into feeding tube every evening.    . haloperidol decanoate (HALDOL DECANOATE) 100 MG/ML injection Inject 100 mg into the muscle every 28 (twenty-eight) days. Administered by [facility]    . levETIRAcetam (KEPPRA) 250 MG tablet 250 mg 2 (two) times daily. Per tube    . loratadine (CLARITIN) 10 MG tablet Place 1 tablet (10 mg total) into feeding tube daily.    Marland Kitchen lovastatin (MEVACOR) 20 MG tablet Place 1 tablet (20 mg total) into feeding tube daily.    . Nutritional Supplements (FEEDING SUPPLEMENT, JEVITY 1.5 CAL/FIBER,) LIQD Place 1,000 mLs into feeding tube continuous. Initiate nocturnal feedings of Jevity 1.5 @ 100 ml/hr via PEG over 14 hour period     No facility-administered medications prior to visit.     PAST MEDICAL HISTORY: Past Medical History:  Diagnosis Date  . Cognitive communication deficit   . Drug-induced Parkinsonism ([place])   . Dysphagia   . Hernia   . High cholesterol   . HOH (hard of hearing)   . Malnutrition ([place])   . Schizophrenia ([place])   . Seizures ([place])   . Thyroid disease   . TIA (transient ischemic attack)     PAST SURGICAL HISTORY: Past Surgical  History:  Procedure Laterality Date  . ESOPHAGOGASTRODUODENOSCOPY (EGD) WITH PROPOFOL N/A 09/08/2014   Procedure: ESOPHAGOGASTRODUODENOSCOPY (EGD) WITH PROPOFOL;  Surgeon: Georganna Skeans, MD;  Location: [place];  Service: Endoscopy;  Laterality: N/A;  . LAPAROTOMY N/A 09/01/2014   Procedure: EXPLORATORY LAPAROTOMY, SIGMOID COLECTOMY, SIGMOID COLOSTOMY, PRIMARY REPAIR OF STRANGULATED INGUINAL HERNIA.;  Surgeon: Rolm Bookbinder, MD;  Location: [place];  Service: General;  Laterality: N/A;  . LEG SURGERY      . PEG PLACEMENT N/A 09/08/2014   Procedure: PERCUTANEOUS ENDOSCOPIC GASTROSTOMY (PEG) PLACEMENT;  Surgeon: Georganna Skeans, MD;  Location: [place];  Service: Endoscopy;  Laterality: N/A;  . PEG PLACEMENT N/A 06/30/2015   Procedure: PERCUTANEOUS ENDOSCOPIC GASTROSTOMY (PEG) REPLACEMENT;  Surgeon: Daneil Dolin, MD;  [facility];  Service: Endoscopy;  Laterality: N/A;  1100  . PEG PLACEMENT N/A 12/30/2015   Procedure: PERCUTANEOUS ENDOSCOPIC GASTROSTOMY (PEG) REPLACEMENT;  Surgeon: Daneil Dolin, MD;  [facility];  Service: Endoscopy;  Laterality: N/A;  . TESTICLE SURGERY      FAMILY HISTORY: Family History  Problem Relation Age of Onset  . Family history unknown: Yes    SOCIAL HISTORY:  Social History   Social History  . Marital status: Single    Spouse name: N/A  . Number of children: N/A  . Years of education: N/A   Occupational History  . Not on file.   Social History Main Topics  . Smoking status: Current Every Day Smoker  . Smokeless tobacco: Never Used  . Alcohol use No  . Drug use: No  . Sexual activity: No   Other Topics Concern  . Not on file   Social History Narrative  . No narrative on file     PHYSICAL EXAM  GENERAL EXAM/CONSTITUTIONAL: Vitals:  There were no vitals filed for this visit. Wt Readings from Last 3 Encounters:  06/20/16 139 lb (63 kg)  06/01/16 139 lb (63 kg)  04/25/16 130 lb (59 kg)   There is no height or weight on file to calculate BMI. No exam data present  Patient is in no distress; well developed, nourished and groomed; neck is supple  CARDIOVASCULAR:  Examination of carotid arteries is normal; no carotid bruits  Regular rate and rhythm, no murmurs  Examination of peripheral vascular system by observation and palpation is normal  EYES:  Ophthalmoscopic exam of optic discs and posterior segments is normal; no papilledema or hemorrhages  MUSCULOSKELETAL:  Gait, strength, tone, movements  noted in Neurologic exam below  NEUROLOGIC: MENTAL STATUS:  No flowsheet data found.  awake, alert, oriented to person, place and time  [facility] memory   DECR attention and concentration  DECR FLUENCY; comprehension intact, naming intact,   DECR fund of knowledge   CRANIAL NERVE:   2nd, 3rd, 4th, 6th - pupils equal and reactive to light, visual fields full to confrontation, extraocular muscles --> EXOTROPIA, no nystagmus  5th - facial sensation symmetric  7th - facial strength symmetric  8th - hearing intact  9th - palate elevates symmetrically, uvula midline  11th - shoulder shrug symmetric  12th - tongue protrusion midline  SEVERE DYSARTHIRA  MOTOR:   NO TREMOR IN ARMS; MILD MILD TREMOR; NO BRADYKINESIA; NO COGWHEELING; normal bulk and tone, full strength in the BUE, BLE  SENSORY:   normal and symmetric to light touch, temperature, vibration   COORDINATION:   finger-nose-finger, fine finger movements, SLOW  REFLEXES:   deep tendon reflexes present and symmetric  GAIT/STATION:  IN WHEELCHAIR; STANDS SLOWLY; STOOPED POSTURE; SMALL STEPS; HAND TREMOR WITH WALKING; REDUCED ARM SWING    DIAGNOSTIC DATA (LABS, IMAGING, TESTING) - I reviewed patient records, labs, notes, testing and imaging myself where available.  Lab Results  Component Value Date   WBC 7.4 06/20/2016   HGB 12.7 (L) 06/20/2016   HCT 39.0 06/20/2016   MCV 91.3 06/20/2016   PLT 61 (L) 06/20/2016      Component Value Date/Time   NA 141 06/20/2016 1832   K 3.8 06/20/2016 1832   CL 103 06/20/2016 1832   CO2 29 06/20/2016 1832   GLUCOSE 78 06/20/2016 1832   BUN 21 (H) 06/20/2016 1832   CREATININE 0.73 06/20/2016 1832   CALCIUM 9.2 06/20/2016 1832   PROT 6.9 12/10/2015 0133   ALBUMIN 3.3 (L) 12/10/2015 0133   AST 23 12/10/2015 0133   ALT 17 12/10/2015 0133   ALKPHOS 78 12/10/2015 0133   BILITOT 0.4 12/10/2015 0133   GFRNONAA >60 06/20/2016 1832   GFRAA >60 06/20/2016 1832   Lab  Results  Component Value Date   CHOL 95 04/12/2013   HDL 37 (L) 04/12/2013   LDLCALC 48 04/12/2013   TRIG 40 09/01/2014   CHOLHDL 2.6 04/12/2013   Lab Results  Component Value Date   HGBA1C 5.6 04/12/2013   No results found for: VITAMINB12 Lab Results  Component Value Date   TSH 0.695 11/19/2014   Lab Results  Component Value Date   VALPROATE 21 (L) 06/01/2016    04/11/13 MRI brain [I reviewed images myself and agree with interpretation. -VRP]  - Premature atrophy, small vessel disease, and evidence for remote cerebral infarction. No acute intracranial findings.  11/19/14 CT head  1. No acute intracranial process. 2. Stable atrophy with chronic small vessel ischemic disease. 3. 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  09/02/14 EEG  - This is a normal EEG recording during wakefulness and during sleep. No evidence of an epileptic disorder was demonstrated. However, a normal EEG recording in and of itself does not rule out seizure disorder.     ASSESSMENT AND PLAN  61 y.o. year old male here with history of schizophrenia, with hospitalization for strangulated hernia and gastric volvulus, status post colectomy, with postoperative seizure in Sept 2016.   Also with signs and symptoms of drug induced parkinsonism (more prominent in 2017), likely related to current haldol usage. Now resolved in 2018.    Dx:   Seizure disorder ([place])  Medication-induced postural tremor    PLAN:  - continue depakote 625mg  BID + levetiracetam 250mg  daily for seizure mgmt - monitor tremor; no meds at this time - annual labs per PCP (CBC, CMP, ammonia)  Return if symptoms worsen or fail to improve, for return to PCP.    Penni Bombard, MD 7/94/8016, 55:37 AM Certified in Neurology, Neurophysiology and Neuroimaging  [facility] [address], [place], [place] [address] [phone]

## 2016-08-22 NOTE — Patient Instructions (Signed)
-   continue depakote 625mg  TWICE A DAY + levetiracetam 250mg  DAILY for seizure prevention  - monitor tremor  - annual labs per PCP (CBC, CMP, ammonia)  - follow up as needed

## 2016-08-23 ENCOUNTER — Ambulatory Visit (HOSPITAL_COMMUNITY): Payer: Self-pay

## 2016-08-23 ENCOUNTER — Encounter (HOSPITAL_COMMUNITY): Payer: Self-pay

## 2016-08-23 ENCOUNTER — Emergency Department (HOSPITAL_COMMUNITY)
Admission: EM | Admit: 2016-08-23 | Discharge: 2016-08-23 | Disposition: A | Discharge status: Home / Self Care | Payer: Medicare Other | Attending: Emergency Medicine | Admitting: Emergency Medicine

## 2016-08-23 ENCOUNTER — Emergency Department (HOSPITAL_COMMUNITY): Payer: Medicare Other

## 2016-08-23 DIAGNOSIS — F17200 Nicotine dependence, unspecified, uncomplicated: Secondary | ICD-10-CM | POA: Insufficient documentation

## 2016-08-23 DIAGNOSIS — Z431 Encounter for attention to gastrostomy: Secondary | ICD-10-CM | POA: Diagnosis not present

## 2016-08-23 DIAGNOSIS — R4182 Altered mental status, unspecified: Secondary | ICD-10-CM | POA: Insufficient documentation

## 2016-08-23 DIAGNOSIS — Z79899 Other long term (current) drug therapy: Secondary | ICD-10-CM | POA: Diagnosis not present

## 2016-08-23 DIAGNOSIS — T82898A Other specified complication of vascular prosthetic devices, implants and grafts, initial encounter: Secondary | ICD-10-CM | POA: Diagnosis not present

## 2016-08-23 DIAGNOSIS — Z743 Need for continuous supervision: Secondary | ICD-10-CM | POA: Diagnosis not present

## 2016-08-23 DIAGNOSIS — Z4589 Encounter for adjustment and management of other implanted devices: Secondary | ICD-10-CM | POA: Diagnosis not present

## 2016-08-23 DIAGNOSIS — R279 Unspecified lack of coordination: Secondary | ICD-10-CM | POA: Diagnosis not present

## 2016-08-23 DIAGNOSIS — T83498A Other mechanical complication of other prosthetic devices, implants and grafts of genital tract, initial encounter: Secondary | ICD-10-CM | POA: Diagnosis not present

## 2016-08-23 HISTORY — DX: Schizoaffective disorder, unspecified: F25.9

## 2016-08-23 HISTORY — DX: Colostomy status: Z93.3

## 2016-08-23 HISTORY — DX: Hostility: R45.5

## 2016-08-23 HISTORY — DX: Unspecified hearing loss, unspecified ear: H91.90

## 2016-08-23 MED ORDER — IOPAMIDOL (ISOVUE-300) INJECTION 61%
INTRAVENOUS | Status: DC
Start: 2016-08-23 — End: 2016-08-23
  Filled 2016-08-23: qty 50

## 2016-08-23 NOTE — ED Triage Notes (Signed)
Pt sent from [place]. Pt pulled out feeding tube. Has foley cath in place at this time.

## 2016-08-23 NOTE — ED Provider Notes (Signed)
[place] DEPT Provider Note   CSN: [id number] Arrival date & time: 08/23/16  1558     History   Chief Complaint Chief Complaint  Patient presents with  . peg tube placement    HPI Steven David is a 61 y.o. male.  The history is provided by the nursing home and the patient. The history is limited by the condition of the patient (communication deficit).  Pt was seen at 1600. Pt sent by NH due to pulling out PEG tube. NH replaced with foley catheter then sent him to the ED for placement of PEG.   Past Medical History:  Diagnosis Date  . Aggressive outburst   . Cognitive communication deficit   . Colostomy in place [facility])   . Drug-induced Parkinsonism ([place])   . Dysphagia   . Dysphagia   . Hard of hearing    Wears bilateral hearing aides  . Hernia   . High cholesterol   . HOH (hard of hearing)   . Malnutrition ([place])   . Malnutrition ([place])   . Schizoaffective disorder ([place])   . Schizophrenia ([place])   . Seizures ([place])   . Thyroid disease   . TIA (transient ischemic attack)     Patient Active Problem List   Diagnosis Date Noted  . Malfunction of percutaneous endoscopic gastrostomy (PEG) tube ([place])   . Malnutrition of moderate degree 11/20/2014  . HCAP (healthcare-associated pneumonia) 11/19/2014  . Postoperative anemia due to acute blood loss 09/07/2014  . Seizure ([place])   . Incarcerated inguinal hernia 09/01/2014  . Respiratory failure, acute ([place]) 09/01/2014  . Metabolic acidosis 42/70/6237  . Incarcerated hernia 09/01/2014  . Septic shock ([place]) 09/01/2014  . AKI (acute kidney injury) ([place])   . Protein-calorie malnutrition, severe ([place]) 04/13/2013  . Sepsis ([place]) 04/11/2013  . H/O stroke within last year 04/11/2013  . Hypothyroidism 04/11/2013  . Pneumonia 04/11/2013  . Thrombocytopenia ([place]) 04/11/2013  . Schizophrenia ([place]) 04/11/2013  . Malnutrition ([place]) 04/11/2013  . SIRS (systemic inflammatory response syndrome) ([place]) 04/11/2013    Past  Surgical History:  Procedure Laterality Date  . ESOPHAGOGASTRODUODENOSCOPY (EGD) WITH PROPOFOL N/A 09/08/2014   Procedure: ESOPHAGOGASTRODUODENOSCOPY (EGD) WITH PROPOFOL;  Surgeon: Georganna Skeans, MD;  Location: [place];  Service: Endoscopy;  Laterality: N/A;  . LAPAROTOMY N/A 09/01/2014   Procedure: EXPLORATORY LAPAROTOMY, SIGMOID COLECTOMY, SIGMOID COLOSTOMY, PRIMARY REPAIR OF STRANGULATED INGUINAL HERNIA.;  Surgeon: Rolm Bookbinder, MD;  Location: [place];  Service: General;  Laterality: N/A;  . LEG SURGERY    . PEG PLACEMENT N/A 09/08/2014   Procedure: PERCUTANEOUS ENDOSCOPIC GASTROSTOMY (PEG) PLACEMENT;  Surgeon: Georganna Skeans, MD;  Location: [name];  Service: Endoscopy;  Laterality: N/A;  . PEG PLACEMENT N/A 06/30/2015   Procedure: PERCUTANEOUS ENDOSCOPIC GASTROSTOMY (PEG) REPLACEMENT;  Surgeon: Daneil Dolin, MD;  [facility];  Service: Endoscopy;  Laterality: N/A;  1100  . PEG PLACEMENT N/A 12/30/2015   Procedure: PERCUTANEOUS ENDOSCOPIC GASTROSTOMY (PEG) REPLACEMENT;  Surgeon: Daneil Dolin, MD;  [facility];  Service: Endoscopy;  Laterality: N/A;  . TESTICLE SURGERY         Home Medications    Prior to Admission medications   Medication Sig Start Date End Date Taking? Authorizing Provider  acetaminophen (TYLENOL) 325 MG tablet Place 650 mg into feeding tube every 6 (six) hours as needed for moderate pain or fever.    [provider]  divalproex (DEPAKOTE SPRINKLE) 125 MG capsule Take 5 capsules (625 mg total) by mouth every  12 (twelve) hours. Patient taking differently: 650 mg every 12 (twelve) hours. Per Tube* 11/21/14   Sinda Du, MD  ferrous sulfate 325 (65 FE) MG tablet 325 mg daily with breakfast. Per tube    [provider]  furosemide (LASIX) 20 MG tablet Place 10 mg into feeding tube 2 (two) times daily.     [provider]  guaifenesin (HUMIBID E) 400 MG TABS tablet 400 mg every 4 (four) hours. Per tube     [provider]  haloperidol (HALDOL) 10 MG tablet Place 1 tablet (10 mg total) into feeding tube every evening. 09/10/14   Nat Christen, PA-C  haloperidol decanoate (HALDOL DECANOATE) 100 MG/ML injection Inject 100 mg into the muscle every 28 (twenty-eight) days. Administered by [facility]    [provider]  levETIRAcetam (KEPPRA) 250 MG tablet 250 mg 2 (two) times daily. Per tube    [provider]  loratadine (CLARITIN) 10 MG tablet Place 1 tablet (10 mg total) into feeding tube daily. 09/10/14   Nat Christen, PA-C  lovastatin (MEVACOR) 20 MG tablet Place 1 tablet (20 mg total) into feeding tube daily. 09/10/14   Nat Christen, PA-C  Nutritional Supplements (FEEDING SUPPLEMENT, JEVITY 1.5 CAL/FIBER,) LIQD Place 1,000 mLs into feeding tube continuous. Initiate nocturnal feedings of Jevity 1.5 @ 100 ml/hr via PEG over 14 hour period 09/10/14   Nat Christen, PA-C  OLANZapine zydis (ZYPREXA) 5 MG disintegrating tablet Take 5 mg by mouth at bedtime.    [provider]    Family History Family History  Problem Relation Age of Onset  . Family history unknown: Yes    Social History Social History  Substance Use Topics  . Smoking status: Current Every Day Smoker  . Smokeless tobacco: Never Used  . Alcohol use No     Allergies   Patient has no known allergies.   Review of Systems Review of Systems  Unable to perform ROS: Psychiatric disorder     Physical Exam Updated Vital Signs BP 113/77 (BP Location: Right Arm)   Pulse 75   Temp 98.1 F (36.7 C) (Oral)   Wt 63 kg (139 lb)   SpO2 98%   BMI 19.94 kg/m   Physical Exam 1605: Physical examination:  Nursing notes reviewed; Vital signs and O2 SAT reviewed;  Constitutional: Well developed, Well nourished, Well hydrated, In no acute distress; Head:  Normocephalic, atraumatic; Eyes: EOMI, PERRL, No scleral icterus; ENMT: Mouth and pharynx normal, Mucous membranes moist; Neck: Supple, Full range of  motion, No lymphadenopathy; Cardiovascular: Regular rate and rhythm, No gallop; Respiratory: Breath sounds clear & equal bilaterally, No wheezes. Normal respiratory effort/excursion; Chest: Nontender, Movement normal; Abdomen: Soft, Nontender, Nondistended, Normal bowel sounds. +foley catheter in place.; Genitourinary: No CVA tenderness; Extremities: Pulses normal, No tenderness, No edema, No calf edema or asymmetry.; Neuro: Awake, alert.  Speech dysarthric per hx. No gross focal motor deficits in extremities.; Skin: Color normal, Warm, Dry.   ED Treatments / Results  Labs (all labs ordered are listed, but only abnormal results are displayed)   EKG  EKG Interpretation None       Radiology   Procedures Procedures (including critical care time)  Gastrostomy tube replacement Performed by: Alfonzo Feller Consent: Verbal consent obtained. Risks and benefits: risks, benefits and alternatives were discussed Required items: required blood products, implants, devices, and special equipment available Patient identity confirmed: hospital-assigned identification number Time out: Immediately prior to procedure a "time out" was called to verify  the correct patient, procedure, equipment, support staff and site/side marked as required. Preparation: Patient was prepped and draped in the usual sterile fashion. Patient tolerance: Patient tolerated the procedure well with no immediate complications.  Comments: 20 french Gastrostomy tube placed without difficulty     Medications Ordered in ED Medications - No data to display   Initial Impression / Assessment and Plan / ED Course  I have reviewed the triage vital signs and the nursing notes.  Pertinent labs & imaging results that were available during my care of the patient were reviewed by me and considered in my medical decision making (see chart for details).  MDM Reviewed: previous chart, nursing note and vitals Interpretation:  x-ray   Dg Fluoro Rm 1-60 Min Result Date: 08/23/2016 CLINICAL DATA:  Replaced gastrostomy tube EXAM: FLOURO RM 1-60 MIN CONTRAST:  50 cc Isovue-300 per PEG tube FLUOROSCOPY TIME:  Fluoroscopy Time:  0 minutes 30 seconds Radiation Exposure Index (if provided by the fluoroscopic device): 5.5 mGy Number of Acquired Spot Images: 2 COMPARISON:  12/29/2015 FINDINGS: Gastrostomy tube LEFT upper quadrant. With contrast injection, contrast opacifies the gastric lumen. Normal appearing rugal folds. No contrast extravasation identified. The bumper was repositioned to bring the gastrostomy tube balloon adjacent to the anterior gastric wall. IMPRESSION: Replaced gastrostomy tube is within the gastric lumen. Electronically Signed   By: Lavonia Dana M.D.   On: 08/23/2016 17:43     1615:  T/C to GI Dr. Laural Golden, case discussed, including:  HPI, pertinent PM/SHx, VS/PE, dx testing, ED course and treatment:  OK to replace with 9F standard balloon replacement kit and confirm with [facility], call [phone] to obtain kit.  1640:  Foley catheter removed and replaced wth 9F Endovive standard balloon replacement kit.   1750:  XR with PEG in good position (above). Will d/c back to NH stable.   Final Clinical Impressions(s) / ED Diagnoses   Final diagnoses:  None    New Prescriptions New Prescriptions   No medications on file      Francine Graven, DO 08/25/16 1728

## 2016-08-23 NOTE — Discharge Instructions (Signed)
Take your usual prescriptions as previously directed.  Call your regular medical doctor and GI doctor tomorrow to schedule a follow up appointment within the next 2 to 3 days.  Return to the Emergency Department immediately sooner if worsening.

## 2016-08-23 NOTE — ED Notes (Signed)
Pt dislodged peg tube and now a foley catheter is in place. NAD.

## 2016-08-24 ENCOUNTER — Encounter (HOSPITAL_COMMUNITY): Payer: Self-pay | Admitting: *Deleted

## 2016-08-24 ENCOUNTER — Ambulatory Visit (HOSPITAL_COMMUNITY): Payer: Medicare Other

## 2016-08-24 ENCOUNTER — Ambulatory Visit (HOSPITAL_COMMUNITY)
Admission: RE | Admit: 2016-08-24 | Discharge: 2016-08-24 | Disposition: A | Discharge status: Skilled Nursing Facility | Payer: Medicare Other | Source: Ambulatory Visit | Attending: Internal Medicine | Admitting: Internal Medicine

## 2016-08-24 ENCOUNTER — Telehealth: Payer: Self-pay | Admitting: Internal Medicine

## 2016-08-24 ENCOUNTER — Other Ambulatory Visit: Payer: Self-pay

## 2016-08-24 ENCOUNTER — Encounter (HOSPITAL_COMMUNITY)
Admission: RE | Disposition: A | Discharge status: Skilled Nursing Facility | Payer: Self-pay | Source: Ambulatory Visit | Attending: Internal Medicine

## 2016-08-24 DIAGNOSIS — Z4589 Encounter for adjustment and management of other implanted devices: Secondary | ICD-10-CM | POA: Diagnosis not present

## 2016-08-24 DIAGNOSIS — Z4659 Encounter for fitting and adjustment of other gastrointestinal appliance and device: Secondary | ICD-10-CM

## 2016-08-24 DIAGNOSIS — Z431 Encounter for attention to gastrostomy: Secondary | ICD-10-CM

## 2016-08-24 HISTORY — PX: PEG PLACEMENT: SHX5437

## 2016-08-24 SURGERY — REPLACEMENT, PEG TUBE, WITHOUT ENDOSCOPY
Anesthesia: LOCAL

## 2016-08-24 MED ORDER — IOPAMIDOL (ISOVUE-300) INJECTION 61%
INTRAVENOUS | Status: AC
  Administered 2016-08-24: 50 mL via GASTROSTOMY
  Filled 2016-08-24: qty 50

## 2016-08-24 NOTE — Telephone Encounter (Signed)
Ginger, please let them know. Thanks.

## 2016-08-24 NOTE — Progress Notes (Signed)
Patient ID: Steven David, male   DOB: March 31, 1955, 61 y.o.   MRN: [id number]   Patient's PEG tube has been pulled out on 2 occasions over the past 24 hours. Came to the ED yesterday and had replaced. It came out overnight. Patient adamant about eating (and apparently has been eating and drinking on his own) . States he does not need PEG tube however Dr. patient's records suggest high risk aspiration orthopedics not recommended. Patient may clear to me he wants to eat. He states that he would allow me to replace the PEG tube today. He came out around 4:00 this morning. I told the patient should speak to his attending physician regarding allowing comfort feedings from time to time. Procedure note. The ostomy looks good. Pain 20 French angulated balloon bumper PEG tube [employer])) attempted to pass the ostomy ostomy was tight. I was able to dilate with cervical dilators to 7 mm and 11 mm I was unable to place the PEG tube into the gastric cavity. There was immediate to all gastric contents external bumper secured at 2 cm. There was excellent auscultation of insufflated air. However, due to the manipulated recent manipulation and traumatic removal of the tube I will get a Gastrografin tube study to ensure proper position. Also, I am suspicious that the patient had something to do with removing the PEG tube purposely although he denies.  I told the patient that if it comes out again, I will not replace.

## 2016-08-24 NOTE — Op Note (Signed)
External bumper secured at the 2 cm mark following placement.

## 2016-08-24 NOTE — Telephone Encounter (Signed)
April is aware.

## 2016-08-24 NOTE — Telephone Encounter (Signed)
I spoke with Dr.Rourk, he wants the pt to come to endo and he will work him in. I called [facility] and spoke with Arbie Cookey and she is aware and will get him over there.  Ginger, please put the orders in for a peg. Thanks.

## 2016-08-24 NOTE — Telephone Encounter (Signed)
I would suggest follow-up with the doctor who originally placed PEG in this patient-[place].

## 2016-08-24 NOTE — Telephone Encounter (Signed)
Spoke with Arbie Cookey, nurse at [facility]. She said the 3rd shift nurse reported that at 4am the peg tube (which was just replaced in the ED yesterday) came out. They did not replace it with a foley. The patient cannot take anything by mouth. Dr.Rourk, please advise.

## 2016-08-24 NOTE — Telephone Encounter (Signed)
Devita at [facility] called to say that patient went to the ER yesterday to have his Peg Tube placed. Since coming back to the nursing home it has come out and she was asking if we could get him scheduled today to have it put back in. Please advise and call her back at [phone] she is the upper B Scientist, physiological.

## 2016-08-24 NOTE — Telephone Encounter (Signed)
Orders have been put in and Hoyle Sauer is aware

## 2016-08-24 NOTE — OR Nursing (Signed)
Report called to Jacqulyn Ducking LPN at [facility]. Will contact [facility] [employer] once PEG tube placement is confirmed by Radiology.

## 2016-08-24 NOTE — Discharge Instructions (Signed)
Gastrografin tube study to confirm PEG tube is within the stomach.  May resume usual use/care of PEG tube per nursing home staff once internal position of tube confirmed.  Keep abdominal binder in place to protect the PEG tube  Patient is to talk with attending physician regarding comfort feedings.  Patient understands that if PEG tube comes out again I will not replace.

## 2016-08-24 NOTE — Telephone Encounter (Signed)
April from [facility] called- they have gotten the patients discharge instructions and are aware that you will not be replacing it if it comes out again, they want to know, if it comes out again, where would be the best place to send him? Should they just send him to the ED?   (They also said pt will not keep abd binder on, he keeps the peg tucked into his pants. He is ambulatory and sneaks into other residents rooms and takes their drinks, etc, even though he is NPO.  Pt had a MBSS in July and it showed he was a sever aspiration risk.)

## 2016-08-25 ENCOUNTER — Encounter (HOSPITAL_COMMUNITY): Payer: Self-pay | Admitting: Internal Medicine

## 2016-09-18 ENCOUNTER — Encounter (HOSPITAL_COMMUNITY): Payer: Medicare Other | Attending: Oncology | Admitting: Oncology

## 2016-09-18 ENCOUNTER — Encounter (HOSPITAL_COMMUNITY): Payer: Self-pay

## 2016-09-18 ENCOUNTER — Ambulatory Visit (HOSPITAL_COMMUNITY)
Admission: RE | Admit: 2016-09-18 | Discharge: 2016-09-18 | Disposition: A | Discharge status: Home / Self Care | Payer: Medicare Other | Source: Ambulatory Visit | Attending: Oncology | Admitting: Oncology

## 2016-09-18 ENCOUNTER — Encounter (HOSPITAL_COMMUNITY): Payer: Medicare Other

## 2016-09-18 VITALS — BP 101/36 | HR 73 | Resp 16 | Ht 70.0 in | Wt 134.0 lb

## 2016-09-18 DIAGNOSIS — D696 Thrombocytopenia, unspecified: Secondary | ICD-10-CM | POA: Diagnosis not present

## 2016-09-18 DIAGNOSIS — R932 Abnormal findings on diagnostic imaging of liver and biliary tract: Secondary | ICD-10-CM | POA: Insufficient documentation

## 2016-09-18 DIAGNOSIS — R471 Dysarthria and anarthria: Secondary | ICD-10-CM

## 2016-09-18 DIAGNOSIS — E7800 Pure hypercholesterolemia, unspecified: Secondary | ICD-10-CM | POA: Diagnosis not present

## 2016-09-18 DIAGNOSIS — Z931 Gastrostomy status: Secondary | ICD-10-CM | POA: Insufficient documentation

## 2016-09-18 DIAGNOSIS — Z79899 Other long term (current) drug therapy: Secondary | ICD-10-CM | POA: Insufficient documentation

## 2016-09-18 DIAGNOSIS — Z933 Colostomy status: Secondary | ICD-10-CM | POA: Diagnosis not present

## 2016-09-18 DIAGNOSIS — F17200 Nicotine dependence, unspecified, uncomplicated: Secondary | ICD-10-CM | POA: Insufficient documentation

## 2016-09-18 DIAGNOSIS — G2119 Other drug induced secondary parkinsonism: Secondary | ICD-10-CM | POA: Diagnosis not present

## 2016-09-18 DIAGNOSIS — Z720 Tobacco use: Secondary | ICD-10-CM

## 2016-09-18 DIAGNOSIS — F259 Schizoaffective disorder, unspecified: Secondary | ICD-10-CM | POA: Insufficient documentation

## 2016-09-18 DIAGNOSIS — E079 Disorder of thyroid, unspecified: Secondary | ICD-10-CM | POA: Diagnosis not present

## 2016-09-18 DIAGNOSIS — Z8673 Personal history of transient ischemic attack (TIA), and cerebral infarction without residual deficits: Secondary | ICD-10-CM | POA: Diagnosis not present

## 2016-09-18 DIAGNOSIS — R41841 Cognitive communication deficit: Secondary | ICD-10-CM | POA: Diagnosis not present

## 2016-09-18 LAB — CBC WITH DIFFERENTIAL/PLATELET
Basophils Absolute: 0 10*3/uL (ref 0.0–0.1)
Basophils Relative: 0 %
EOS ABS: 0.3 10*3/uL (ref 0.0–0.7)
Eosinophils Relative: 4 %
HEMATOCRIT: 42 % (ref 39.0–52.0)
HEMOGLOBIN: 13.3 g/dL (ref 13.0–17.0)
LYMPHS ABS: 1.3 10*3/uL (ref 0.7–4.0)
Lymphocytes Relative: 17 %
MCH: 29.4 pg (ref 26.0–34.0)
MCHC: 31.7 g/dL (ref 30.0–36.0)
MCV: 92.9 fL (ref 78.0–100.0)
MONOS PCT: 13 %
Monocytes Absolute: 1.1 10*3/uL — ABNORMAL HIGH (ref 0.1–1.0)
NEUTROS PCT: 66 %
Neutro Abs: 5.3 10*3/uL (ref 1.7–7.7)
Platelets: 63 10*3/uL — ABNORMAL LOW (ref 150–400)
RBC: 4.52 MIL/uL (ref 4.22–5.81)
RDW: 16 % — AB (ref 11.5–15.5)
WBC: 8 10*3/uL (ref 4.0–10.5)

## 2016-09-18 LAB — COMPREHENSIVE METABOLIC PANEL
ALT: 13 U/L — AB (ref 17–63)
AST: 20 U/L (ref 15–41)
Albumin: 3.7 g/dL (ref 3.5–5.0)
Alkaline Phosphatase: 82 U/L (ref 38–126)
Anion gap: 9 (ref 5–15)
BUN: 21 mg/dL — AB (ref 6–20)
CALCIUM: 9.5 mg/dL (ref 8.9–10.3)
CO2: 33 mmol/L — ABNORMAL HIGH (ref 22–32)
CREATININE: 0.76 mg/dL (ref 0.61–1.24)
Chloride: 98 mmol/L — ABNORMAL LOW (ref 101–111)
GFR calc Af Amer: >60 mL/min (ref >60)
GFR calc non Af Amer: >60 mL/min (ref >60)
Glucose, Bld: 91 mg/dL (ref 65–99)
POTASSIUM: 4.1 mmol/L (ref 3.5–5.1)
Sodium: 140 mmol/L (ref 135–145)
Total Bilirubin: 0.3 mg/dL (ref 0.3–1.2)
Total Protein: 8 g/dL (ref 6.5–8.1)

## 2016-09-18 LAB — VITAMIN B12: VITAMIN B 12: 970 pg/mL — AB (ref 180–914)

## 2016-09-18 LAB — FOLATE: Folate: 31 ng/mL (ref >5.9)

## 2016-09-18 NOTE — Progress Notes (Signed)
[facility] Initial Visit:  Patient Care Team: Hilbert Corrigan, MD as PCP - General (Internal Medicine)  CHIEF COMPLAINTS/PURPOSE OF CONSULTATION:  Thrombocytopenia  HISTORY OF PRESENTING ILLNESS: Steven David 61 y.o. male is here because of thrombocytopenia. Patient is currently a resident at [facility] and [facility] I have limited medical records from his current facility. Per his records he has a history of thrombocytopenia since 2014. It has been stable since 2017. There is no history of cirrhosis or splenomegaly per his medical records. Patient has dysarthria and underlying cognitive deficit which makes it difficult to get any history from him. He had lab work performed on 05/21/16 which demonstrated WBC 9.5K, hemoglobin 13.3 g/dL, hematocrit 42.2%, platelet count 68K.   Review of Systems - Oncology Very difficult to obtain. Patient denies any chest pain, shortness of breath, abdominal pain.  MEDICAL HISTORY: Past Medical History:  Diagnosis Date  . Aggressive outburst   . Cognitive communication deficit   . Colostomy in place [facility])   . Drug-induced Parkinsonism ([place])   . Dysphagia   . Dysphagia   . Hard of hearing    Wears bilateral hearing aides  . Hernia   . High cholesterol   . HOH (hard of hearing)   . Malnutrition ([place])   . Malnutrition ([place])   . Schizoaffective disorder ([place])   . Schizophrenia ([place])   . Seizures ([place])   . Thyroid disease   . TIA (transient ischemic attack)     SURGICAL HISTORY: Past Surgical History:  Procedure Laterality Date  . ESOPHAGOGASTRODUODENOSCOPY (EGD) WITH PROPOFOL N/A 09/08/2014   Procedure: ESOPHAGOGASTRODUODENOSCOPY (EGD) WITH PROPOFOL;  Surgeon: Georganna Skeans, MD;  Location: [place];  Service: Endoscopy;  Laterality: N/A;  . LAPAROTOMY N/A 09/01/2014   Procedure: EXPLORATORY LAPAROTOMY, SIGMOID COLECTOMY, SIGMOID COLOSTOMY, PRIMARY REPAIR OF STRANGULATED INGUINAL  HERNIA.;  Surgeon: Rolm Bookbinder, MD;  Location: [place];  Service: General;  Laterality: N/A;  . LEG SURGERY    . PEG PLACEMENT N/A 09/08/2014   Procedure: PERCUTANEOUS ENDOSCOPIC GASTROSTOMY (PEG) PLACEMENT;  Surgeon: Georganna Skeans, MD;  Location: [place];  Service: Endoscopy;  Laterality: N/A;  . PEG PLACEMENT N/A 06/30/2015   Procedure: PERCUTANEOUS ENDOSCOPIC GASTROSTOMY (PEG) REPLACEMENT;  Surgeon: Daneil Dolin, MD;  [facility];  Service: Endoscopy;  Laterality: N/A;  1100  . PEG PLACEMENT N/A 12/30/2015   Procedure: PERCUTANEOUS ENDOSCOPIC GASTROSTOMY (PEG) REPLACEMENT;  Surgeon: Daneil Dolin, MD;  [facility];  Service: Endoscopy;  Laterality: N/A;  . PEG PLACEMENT N/A 08/24/2016   Procedure: PERCUTANEOUS ENDOSCOPIC GASTROSTOMY (PEG) REPLACEMENT;  Surgeon: Daneil Dolin, MD;  [facility];  Service: Endoscopy;  Laterality: N/A;  . TESTICLE SURGERY      SOCIAL HISTORY: Social History   Social History  . Marital status: Single    Spouse name: N/A  . Number of children: N/A  . Years of education: N/A   Occupational History  . Not on file.   Social History Main Topics  . Smoking status: Current Every Day Smoker  . Smokeless tobacco: Never Used  . Alcohol use No  . Drug use: No  . Sexual activity: No   Other Topics Concern  . Not on file   Social History Narrative  . No narrative on file    FAMILY HISTORY Family History  Problem Relation Age of Onset  . Family history unknown: Yes    ALLERGIES:  has No Known Allergies.  MEDICATIONS:  Current Outpatient Prescriptions  Medication Sig Dispense Refill  . acetaminophen (TYLENOL) 325 MG tablet Place 650 mg into feeding tube every 6 (six) hours as needed for moderate pain or fever.    . cholecalciferol (VITAMIN D) 1000 units tablet Take 1,000 Units by mouth daily.    . divalproex (DEPAKOTE SPRINKLE) 125 MG capsule Take 5 capsules (625 mg total) by mouth every 12 (twelve)  hours. (Patient taking differently: 650 mg every 12 (twelve) hours. Per Tube*)    . ferrous sulfate 325 (65 FE) MG tablet 325 mg daily with breakfast. Per tube    . furosemide (LASIX) 20 MG tablet Place 10 mg into feeding tube 2 (two) times daily.     Marland Kitchen guaifenesin (HUMIBID E) 400 MG TABS tablet 400 mg every 4 (four) hours. Per tube    . haloperidol (HALDOL) 10 MG tablet Place 1 tablet (10 mg total) into feeding tube every evening.    . haloperidol decanoate (HALDOL DECANOATE) 100 MG/ML injection Inject 100 mg into the muscle every 28 (twenty-eight) days. Administered by [facility]    . levETIRAcetam (KEPPRA) 250 MG tablet 250 mg 2 (two) times daily. Per tube    . levothyroxine (SYNTHROID, LEVOTHROID) 100 MCG tablet Take 100 mcg by mouth daily before breakfast.    . loratadine (CLARITIN) 10 MG tablet Place 1 tablet (10 mg total) into feeding tube daily.    Marland Kitchen lovastatin (MEVACOR) 20 MG tablet Place 1 tablet (20 mg total) into feeding tube daily.    . Nutritional Supplements (FEEDING SUPPLEMENT, JEVITY 1.5 CAL/FIBER,) LIQD Place 1,000 mLs into feeding tube continuous. Initiate nocturnal feedings of Jevity 1.5 @ 100 ml/hr via PEG over 14 hour period    . OLANZapine zydis (ZYPREXA) 5 MG disintegrating tablet Take 5 mg by mouth at bedtime.    . polyethylene glycol (MIRALAX / GLYCOLAX) packet Take 17 g by mouth daily.    . vitamin C (ASCORBIC ACID) 500 MG tablet Take 500 mg by mouth daily.     No current facility-administered medications for this visit.     PHYSICAL EXAMINATION:   Vitals:   09/18/16 0851  BP: (!) 101/36  Pulse: 73  Resp: 16  SpO2: 98%    Filed Weights   09/18/16 0851  Weight: 134 lb (60.8 kg)     Physical Exam Constitutional: Well-developed, well-nourished, and in no distress.   HENT: Extremely hard of hearing. Head: Normocephalic and atraumatic.  Mouth/Throat: No oropharyngeal exudate. Mucosa moist. Eyes: Left eye strabismus. No scleral icterus.  Neck: Normal range  of motion. Neck supple. No JVD present.  Cardiovascular: Normal rate, regular rhythm and normal heart sounds.  Exam reveals no gallop and no friction rub.   No murmur heard. Pulmonary/Chest: Effort normal and breath sounds normal. No respiratory distress. No wheezes.No rales.  Abdominal: Soft. +PEG tube. +colostomy. There is no tenderness. There is no guarding.  Musculoskeletal: No edema or tenderness.  Lymphadenopathy:    No cervical or supraclavicular adenopathy.  Neurological: Patient has a resting tremor.  Skin: Skin is warm and dry. No rash noted. No erythema. No pallor.     LABORATORY DATA: I have personally reviewed the data as listed:  No visits with results within 1 Month(s) from this visit.  Latest known visit with results is:  Admission on 06/20/2016, Discharged on 06/20/2016  Component Date Value Ref Range Status  . WBC 06/20/2016 7.4  4.0 - 10.5 K/uL Final  . RBC 06/20/2016 4.27  4.22 - 5.81 MIL/uL  Final  . Hemoglobin 06/20/2016 12.7* 13.0 - 17.0 g/dL Final  . HCT 06/20/2016 39.0  39.0 - 52.0 % Final  . MCV 06/20/2016 91.3  78.0 - 100.0 fL Final  . MCH 06/20/2016 29.7  26.0 - 34.0 pg Final  . MCHC 06/20/2016 32.6  30.0 - 36.0 g/dL Final  . RDW 06/20/2016 15.1  11.5 - 15.5 % Final  . Platelets 06/20/2016 61* 150 - 400 K/uL Final   Comment: SPECIMEN CHECKED FOR CLOTS PLATELET COUNT CONFIRMED BY SMEAR   . Neutrophils Relative % 06/20/2016 67  % Final  . Neutro Abs 06/20/2016 5.0  1.7 - 7.7 K/uL Final  . Lymphocytes Relative 06/20/2016 20  % Final  . Lymphs Abs 06/20/2016 1.5  0.7 - 4.0 K/uL Final  . Monocytes Relative 06/20/2016 10  % Final  . Monocytes Absolute 06/20/2016 0.7  0.1 - 1.0 K/uL Final  . Eosinophils Relative 06/20/2016 3  % Final  . Eosinophils Absolute 06/20/2016 0.2  0.0 - 0.7 K/uL Final  . Basophils Relative 06/20/2016 0  % Final  . Basophils Absolute 06/20/2016 0.0  0.0 - 0.1 K/uL Final  . Sodium 06/20/2016 141  135 - 145 mmol/L Final  .  Potassium 06/20/2016 3.8  3.5 - 5.1 mmol/L Final  . Chloride 06/20/2016 103  101 - 111 mmol/L Final  . CO2 06/20/2016 29  22 - 32 mmol/L Final  . Glucose, Bld 06/20/2016 78  65 - 99 mg/dL Final  . BUN 06/20/2016 21* 6 - 20 mg/dL Final  . Creatinine, Ser 06/20/2016 0.73  0.61 - 1.24 mg/dL Final  . Calcium 06/20/2016 9.2  8.9 - 10.3 mg/dL Final  . GFR calc non Af Amer 06/20/2016 >60  >60 mL/min Final  . GFR calc Af Amer 06/20/2016 >60  >60 mL/min Final   Comment: (NOTE) The eGFR has been calculated using the CKD EPI equation. This calculation has not been validated in all clinical situations. eGFR's persistently <60 mL/min signify possible Chronic Kidney Disease.   . Anion gap 06/20/2016 9  5 - 15 Final  . Color, Urine 06/20/2016 YELLOW  YELLOW Final  . APPearance 06/20/2016 CLEAR  CLEAR Final  . Specific Gravity, Urine 06/20/2016 1.018  1.005 - 1.030 Final  . pH 06/20/2016 6.0  5.0 - 8.0 Final  . Glucose, UA 06/20/2016 NEGATIVE  NEGATIVE mg/dL Final  . Hgb urine dipstick 06/20/2016 NEGATIVE  NEGATIVE Final  . Bilirubin Urine 06/20/2016 NEGATIVE  NEGATIVE Final  . Ketones, ur 06/20/2016 5* NEGATIVE mg/dL Final  . Protein, ur 06/20/2016 NEGATIVE  NEGATIVE mg/dL Final  . Nitrite 06/20/2016 NEGATIVE  NEGATIVE Final  . Leukocytes, UA 06/20/2016 NEGATIVE  NEGATIVE Final    RADIOGRAPHIC STUDIES: I have personally reviewed the radiological images as listed and agree with the findings in the report  No results found.  ASSESSMENT/PLAN Thrombocytopenia-chronic since 2014  PLAN: I will plan a thrombocytopenia workup with labs as stated below. Will r/o myelofibrosis as an underlying cause by checking a JAK 2. Rule out underlying nutritional deficiencies in copper, B12, folate. Rule out multiple myeloma as an underlying cause.  I will check an abdominal US to r/o hepatosplenomegaly with decrease in platelets due to platelet sequestration. RTC in 4-5 weeks to review labs and [place]. If patient's  platelet's remain stable in 60k range, I would continue observation.   Orders Placed This Encounter  Procedures  . US Abdomen Complete    Standing Status:   Future    Standing Expiration Date:  09/18/2017    Order Specific Question:   Reason for Exam (SYMPTOM  OR DIAGNOSIS REQUIRED)    Answer:   thrombocytopenia r/o hepatosplenomegaly    Order Specific Question:   Preferred imaging location?    Answer:   [facility]  . CBC with Differential    Standing Status:   Future    Standing Expiration Date:   09/18/2017  . Comprehensive metabolic panel    Standing Status:   Future    Standing Expiration Date:   09/18/2017  . Vitamin B12    Standing Status:   Future    Standing Expiration Date:   09/18/2017  . Folate    Standing Status:   Future    Standing Expiration Date:   09/18/2017  . Hepatitis panel, acute    Standing Status:   Future    Standing Expiration Date:   09/18/2017  . Immunofixation electrophoresis    Standing Status:   Future    Standing Expiration Date:   09/18/2017  . Protein electrophoresis, serum    Standing Status:   Future    Standing Expiration Date:   09/18/2017  . JAK2 V617F, w Reflex to CALR/E12/MPL  . Copper, serum    Standing Status:   Future    Standing Expiration Date:   09/18/2017      All questions were answered. The patient knows to call the clinic with any problems, questions or concerns.  This note was electronically signed.    Twana First, MD  09/18/2016 8:49 AM

## 2016-09-19 DIAGNOSIS — D649 Anemia, unspecified: Secondary | ICD-10-CM | POA: Diagnosis not present

## 2016-09-19 DIAGNOSIS — N390 Urinary tract infection, site not specified: Secondary | ICD-10-CM | POA: Diagnosis not present

## 2016-09-19 DIAGNOSIS — R6889 Other general symptoms and signs: Secondary | ICD-10-CM | POA: Diagnosis not present

## 2016-09-19 DIAGNOSIS — R69 Illness, unspecified: Secondary | ICD-10-CM | POA: Diagnosis not present

## 2016-09-19 LAB — HEPATITIS PANEL, ACUTE
HEP A IGM: NEGATIVE
HEP B S AG: NEGATIVE
Hep B C IgM: NEGATIVE

## 2016-09-19 LAB — IMMUNOFIXATION ELECTROPHORESIS
IGG (IMMUNOGLOBIN G), SERUM: 1248 mg/dL (ref 700–1600)
IgA: 621 mg/dL — ABNORMAL HIGH (ref 90–386)
IgM (Immunoglobulin M), Srm: 76 mg/dL (ref 20–172)
Total Protein ELP: 7.5 g/dL (ref 6.0–8.5)

## 2016-09-20 LAB — COPPER, SERUM: COPPER: 116 ug/dL (ref 72–166)

## 2016-09-20 LAB — PROTEIN ELECTROPHORESIS, SERUM
A/G Ratio: 0.9 (ref 0.7–1.7)
ALBUMIN ELP: 3.6 g/dL (ref 2.9–4.4)
ALPHA-1-GLOBULIN: 0.3 g/dL (ref 0.0–0.4)
Alpha-2-Globulin: 0.9 g/dL (ref 0.4–1.0)
Beta Globulin: 1.3 g/dL (ref 0.7–1.3)
GAMMA GLOBULIN: 1.3 g/dL (ref 0.4–1.8)
Globulin, Total: 3.8 g/dL (ref 2.2–3.9)
TOTAL PROTEIN ELP: 7.4 g/dL (ref 6.0–8.5)

## 2016-09-22 DIAGNOSIS — R319 Hematuria, unspecified: Secondary | ICD-10-CM | POA: Diagnosis not present

## 2016-09-22 DIAGNOSIS — N390 Urinary tract infection, site not specified: Secondary | ICD-10-CM | POA: Diagnosis not present

## 2016-09-28 DIAGNOSIS — E785 Hyperlipidemia, unspecified: Secondary | ICD-10-CM | POA: Diagnosis not present

## 2016-09-28 DIAGNOSIS — I679 Cerebrovascular disease, unspecified: Secondary | ICD-10-CM | POA: Diagnosis not present

## 2016-09-28 DIAGNOSIS — F209 Schizophrenia, unspecified: Secondary | ICD-10-CM | POA: Diagnosis not present

## 2016-09-28 DIAGNOSIS — Z8673 Personal history of transient ischemic attack (TIA), and cerebral infarction without residual deficits: Secondary | ICD-10-CM | POA: Diagnosis not present

## 2016-10-03 LAB — JAK2 V617F, W REFLEX TO CALR/E12/MPL

## 2016-10-19 DIAGNOSIS — Z23 Encounter for immunization: Secondary | ICD-10-CM | POA: Diagnosis present

## 2016-10-20 ENCOUNTER — Encounter (HOSPITAL_COMMUNITY): Payer: Medicare Other

## 2016-10-20 ENCOUNTER — Encounter (HOSPITAL_COMMUNITY): Payer: Self-pay | Admitting: Adult Health

## 2016-10-20 ENCOUNTER — Encounter (HOSPITAL_COMMUNITY): Payer: Medicare Other | Attending: Oncology | Admitting: Adult Health

## 2016-10-20 VITALS — BP 99/61 | HR 77 | Resp 18 | Wt 142.9 lb

## 2016-10-20 DIAGNOSIS — Z933 Colostomy status: Secondary | ICD-10-CM | POA: Diagnosis not present

## 2016-10-20 DIAGNOSIS — M25512 Pain in left shoulder: Secondary | ICD-10-CM | POA: Diagnosis not present

## 2016-10-20 DIAGNOSIS — E7800 Pure hypercholesterolemia, unspecified: Secondary | ICD-10-CM | POA: Diagnosis not present

## 2016-10-20 DIAGNOSIS — G2119 Other drug induced secondary parkinsonism: Secondary | ICD-10-CM | POA: Insufficient documentation

## 2016-10-20 DIAGNOSIS — D696 Thrombocytopenia, unspecified: Secondary | ICD-10-CM | POA: Insufficient documentation

## 2016-10-20 DIAGNOSIS — D890 Polyclonal hypergammaglobulinemia: Secondary | ICD-10-CM | POA: Diagnosis not present

## 2016-10-20 DIAGNOSIS — E079 Disorder of thyroid, unspecified: Secondary | ICD-10-CM | POA: Insufficient documentation

## 2016-10-20 DIAGNOSIS — F259 Schizoaffective disorder, unspecified: Secondary | ICD-10-CM | POA: Diagnosis not present

## 2016-10-20 DIAGNOSIS — M79602 Pain in left arm: Secondary | ICD-10-CM | POA: Diagnosis not present

## 2016-10-20 DIAGNOSIS — F17200 Nicotine dependence, unspecified, uncomplicated: Secondary | ICD-10-CM | POA: Insufficient documentation

## 2016-10-20 DIAGNOSIS — R471 Dysarthria and anarthria: Secondary | ICD-10-CM | POA: Diagnosis not present

## 2016-10-20 DIAGNOSIS — Z931 Gastrostomy status: Secondary | ICD-10-CM | POA: Insufficient documentation

## 2016-10-20 DIAGNOSIS — R41841 Cognitive communication deficit: Secondary | ICD-10-CM | POA: Diagnosis not present

## 2016-10-20 DIAGNOSIS — Z79899 Other long term (current) drug therapy: Secondary | ICD-10-CM | POA: Insufficient documentation

## 2016-10-20 DIAGNOSIS — Z8673 Personal history of transient ischemic attack (TIA), and cerebral infarction without residual deficits: Secondary | ICD-10-CM | POA: Insufficient documentation

## 2016-10-20 LAB — COMPREHENSIVE METABOLIC PANEL
ALBUMIN: 3.3 g/dL — AB (ref 3.5–5.0)
ALK PHOS: 71 U/L (ref 38–126)
ALT: 13 U/L — ABNORMAL LOW (ref 17–63)
ANION GAP: 8 (ref 5–15)
AST: 20 U/L (ref 15–41)
BUN: 17 mg/dL (ref 6–20)
CALCIUM: 8.6 mg/dL — AB (ref 8.9–10.3)
CHLORIDE: 100 mmol/L — AB (ref 101–111)
CO2: 32 mmol/L (ref 22–32)
Creatinine, Ser: 0.84 mg/dL (ref 0.61–1.24)
GFR calc Af Amer: >60 mL/min (ref >60)
GFR calc non Af Amer: >60 mL/min (ref >60)
GLUCOSE: 98 mg/dL (ref 65–99)
Potassium: 4 mmol/L (ref 3.5–5.1)
SODIUM: 140 mmol/L (ref 135–145)
Total Bilirubin: 0.4 mg/dL (ref 0.3–1.2)
Total Protein: 7.2 g/dL (ref 6.5–8.1)

## 2016-10-20 LAB — CBC WITH DIFFERENTIAL/PLATELET
BASOS PCT: 0 %
Basophils Absolute: 0 10*3/uL (ref 0.0–0.1)
EOS ABS: 0.3 10*3/uL (ref 0.0–0.7)
Eosinophils Relative: 3 %
HCT: 40.6 % (ref 39.0–52.0)
HEMOGLOBIN: 12.8 g/dL — AB (ref 13.0–17.0)
Lymphocytes Relative: 12 %
Lymphs Abs: 1.3 10*3/uL (ref 0.7–4.0)
MCH: 29.2 pg (ref 26.0–34.0)
MCHC: 31.5 g/dL (ref 30.0–36.0)
MCV: 92.7 fL (ref 78.0–100.0)
MONOS PCT: 9 %
Monocytes Absolute: 1 10*3/uL (ref 0.1–1.0)
NEUTROS PCT: 77 %
Neutro Abs: 8.5 10*3/uL — ABNORMAL HIGH (ref 1.7–7.7)
PLATELETS: 93 10*3/uL — AB (ref 150–400)
RBC: 4.38 MIL/uL (ref 4.22–5.81)
RDW: 15.6 % — ABNORMAL HIGH (ref 11.5–15.5)
WBC: 11.1 10*3/uL — AB (ref 4.0–10.5)

## 2016-10-20 MED ORDER — INFLUENZA VAC SPLIT QUAD 0.5 ML IM SUSY
0.5000 mL | PREFILLED_SYRINGE | Freq: Once | INTRAMUSCULAR | Status: AC
  Administered 2016-10-20: 0.5 mL via INTRAMUSCULAR

## 2016-10-20 NOTE — Patient Instructions (Addendum)
[place] at [facility] Discharge Instructions  RECOMMENDATIONS MADE BY THE CONSULTANT AND ANY TEST RESULTS WILL BE SENT TO YOUR REFERRING PHYSICIAN.  You were seen today by Mike Craze, NP You will get your flu shot today You will have lab work done today Follow up in 2 months with labs See schedulers up front for appointments    Thank you for choosing [place] at [facility] to provide your oncology and hematology care.  To afford each patient quality time with our provider, please arrive at least 15 minutes before your scheduled appointment time.    If you have a lab appointment with the [facility] please come in thru the  Main Entrance and check in at the main information desk  You need to re-schedule your appointment should you arrive 10 or more minutes late.  We strive to give you quality time with our providers, and arriving late affects you and other patients whose appointments are after yours.  Also, if you no show three or more times for appointments you may be dismissed from the clinic at the providers discretion.     Again, thank you for choosing [facility].  Our hope is that these requests will decrease the amount of time that you wait before being seen by our physicians.       _____________________________________________________________  Should you have questions after your visit to [facility], please contact our office at [phone] between the hours of 8:30 a.m. and 4:30 p.m.  Voicemails left after 4:30 p.m. will not be returned until the following business day.  For prescription refill requests, have your pharmacy contact our office.       Resources For Cancer Patients and their Caregivers ? American Cancer Society: Can assist with transportation, wigs, general needs, runs Look Good Feel Better.        636-391-9171 ? Cancer Care: Provides financial assistance, online support  groups, medication/co-pay assistance.  1-800-813-HOPE 782-245-7656) ? [place] Assists [place] cancer patients and their families through emotional , educational and financial support.  (501)505-8695 ? [place] DSS Where to apply for food stamps, Medicaid and utility assistance. 831-079-6437 ? [facility]: Transportation to medical appointments. 575-224-5803 ? Social Security Administration: May apply for disability if have a Stage IV cancer. 570-857-8896 7474949406 ? [employer], Disability and Transit Services: Assists with nutrition, care and transit needs. [place] Support Programs: @10RELATIVEDAYS @ > Cancer Support Group  2nd Tuesday of the month 1pm-2pm, Journey Room  > Creative Journey  3rd Tuesday of the month 1130am-1pm, Journey Room  > Look Good Feel Better  1st Wednesday of the month 10am-12 noon, Journey Room (Call [place] to register [phone])

## 2016-10-20 NOTE — Progress Notes (Addendum)
Steven David, Steven David [id number]   CLINIC:  Medical Oncology/Hematology  PCP:  Hilbert Corrigan, [place] [phone]   REASON FOR VISIT:  Follow-up for Thrombocytopenia  CURRENT THERAPY: Initial work-up    HISTORY OF PRESENT ILLNESS:  (From Dr. Laverle Patter last note on 09/18/16)     INTERVAL HISTORY:  [name] 61 y.o. male returns for routine follow-up for thrombocytopenia.    He has completed several lab and diagnostic tests and would like to review those results today.   Overall, he tells me that he feels well.  Appetite and energy levels both good at 75%.  Denies any abnormal bruising or bleeding. Denies any blood in his colostomy bag, hematuria, nosebleeds, or gingival bleeding. Denies any headaches or chest pain.   His only complaint today is (L) shoulder and arm pain, which started this morning and he rates 4/10 on pain scale.  He has not had any analgesics this morning according to the SNF staff who is with him today.    Otherwise, he is largely without complaints today.  Currently lives at [facility].   Of note: It is difficult to obtain complete interval history and ROS given patient's underlying cognitive deficit and chronic dysarthria.     REVIEW OF SYSTEMS:  Review of Systems  Constitutional: Positive for fatigue.  Eyes: Negative.   Respiratory: Negative for shortness of breath.   Cardiovascular: Negative for chest pain.  Gastrointestinal: Negative for abdominal pain, blood in stool, constipation and diarrhea.       Colostomy bag; denies any changes in stools or abd pain   Endocrine: Negative.   Genitourinary:        Wears incontinence garment  Musculoskeletal: Positive for arthralgias.  Hematological: Does not bruise/bleed easily.     PAST MEDICAL/SURGICAL HISTORY:  Past Medical History:  Diagnosis Date  . Aggressive outburst   . Cognitive communication  deficit   . Colostomy in place [facility])   . Drug-induced Parkinsonism ([place])   . Dysphagia   . Dysphagia   . Hard of hearing    Wears bilateral hearing aides  . Hernia   . High cholesterol   . HOH (hard of hearing)   . Malnutrition ([place])   . Malnutrition ([place])   . Schizoaffective disorder ([place])   . Schizophrenia ([place])   . Seizures ([place])   . Thyroid disease   . TIA (transient ischemic attack)    Past Surgical History:  Procedure Laterality Date  . ESOPHAGOGASTRODUODENOSCOPY (EGD) WITH PROPOFOL N/A 09/08/2014   Procedure: ESOPHAGOGASTRODUODENOSCOPY (EGD) WITH PROPOFOL;  Surgeon: Georganna Skeans, MD;  Location: [place];  Service: Endoscopy;  Laterality: N/A;  . LAPAROTOMY N/A 09/01/2014   Procedure: EXPLORATORY LAPAROTOMY, SIGMOID COLECTOMY, SIGMOID COLOSTOMY, PRIMARY REPAIR OF STRANGULATED INGUINAL HERNIA.;  Surgeon: Rolm Bookbinder, MD;  Location: [place];  Service: General;  Laterality: N/A;  . LEG SURGERY    . PEG PLACEMENT N/A 09/08/2014   Procedure: PERCUTANEOUS ENDOSCOPIC GASTROSTOMY (PEG) PLACEMENT;  Surgeon: Georganna Skeans, MD;  Location: [place];  Service: Endoscopy;  Laterality: N/A;  . PEG PLACEMENT N/A 06/30/2015   Procedure: PERCUTANEOUS ENDOSCOPIC GASTROSTOMY (PEG) REPLACEMENT;  Surgeon: Daneil Dolin, MD;  [facility];  Service: Endoscopy;  Laterality: N/A;  1100  . PEG PLACEMENT N/A 12/30/2015   Procedure: PERCUTANEOUS ENDOSCOPIC GASTROSTOMY (PEG) REPLACEMENT;  Surgeon: Daneil Dolin, MD;  [facility];  Service: Endoscopy;  Laterality:  N/A;  . PEG PLACEMENT N/A 08/24/2016   Procedure: PERCUTANEOUS ENDOSCOPIC GASTROSTOMY (PEG) REPLACEMENT;  Surgeon: Daneil Dolin, MD;  [facility];  Service: Endoscopy;  Laterality: N/A;  . TESTICLE SURGERY       SOCIAL HISTORY:  Social History   Social History  . Marital status: Single    Spouse name: N/A  . Number of children: N/A  . Years of education: N/A   Occupational History  .  Not on file.   Social History Main Topics  . Smoking status: Current Every Day Smoker  . Smokeless tobacco: Never Used  . Alcohol use No  . Drug use: No  . Sexual activity: No   Other Topics Concern  . Not on file   Social History Narrative  . No narrative on file    FAMILY HISTORY:  Family History  Problem Relation Age of Onset  . Family history unknown: Yes    CURRENT MEDICATIONS:  Outpatient Encounter Prescriptions as of 10/20/2016  Medication Sig Note  . acetaminophen (TYLENOL) 325 MG tablet Place 650 mg into feeding tube every 6 (six) hours as needed for moderate pain or fever.   . cholecalciferol (VITAMIN D) 1000 units tablet Take 1,000 Units by mouth daily.   . divalproex (DEPAKOTE SPRINKLE) 125 MG capsule Take 5 capsules (625 mg total) by mouth every 12 (twelve) hours. (Patient taking differently: 650 mg every 12 (twelve) hours. Per Tube*)   . ferrous sulfate 325 (65 FE) MG tablet 325 mg daily with breakfast. Per tube   . furosemide (LASIX) 20 MG tablet Place 10 mg into feeding tube 2 (two) times daily.    Marland Kitchen guaifenesin (HUMIBID E) 400 MG TABS tablet 400 mg every 4 (four) hours. Per tube   . haloperidol (HALDOL) 10 MG tablet Place 1 tablet (10 mg total) into feeding tube every evening.   . haloperidol decanoate (HALDOL DECANOATE) 100 MG/ML injection Inject 100 mg into the muscle every 28 (twenty-eight) days. Administered by [facility]   . levETIRAcetam (KEPPRA) 250 MG tablet 250 mg 2 (two) times daily. Per tube 01/25/2015: 01/25/15 via peg tube  . levothyroxine (SYNTHROID, LEVOTHROID) 100 MCG tablet Take 100 mcg by mouth daily before breakfast.   . loratadine (CLARITIN) 10 MG tablet Place 1 tablet (10 mg total) into feeding tube daily.   Marland Kitchen lovastatin (MEVACOR) 20 MG tablet Place 1 tablet (20 mg total) into feeding tube daily.   . Nutritional Supplements (FEEDING SUPPLEMENT, JEVITY 1.5 CAL/FIBER,) LIQD Place 1,000 mLs into feeding tube continuous. Initiate nocturnal feedings of  Jevity 1.5 @ 100 ml/hr via PEG over 14 hour period   . OLANZapine zydis (ZYPREXA) 5 MG disintegrating tablet Take 5 mg by mouth at bedtime. 08/22/2016: given via PEG tube/fim  . polyethylene glycol (MIRALAX / GLYCOLAX) packet Take 17 g by mouth daily.   . vitamin C (ASCORBIC ACID) 500 MG tablet Take 500 mg by mouth daily.    Facility-Administered Encounter Medications as of 10/20/2016  Medication  . Influenza vac split quadrivalent PF (FLUARIX) injection 0.5 mL    ALLERGIES:  No Known Allergies   PHYSICAL EXAM:  ECOG Performance status: 2-3 - Symptomatic; requires significant assistance (lives at [facility]).   Vitals:   10/20/16 0900  BP: 99/61  Pulse: 77  Resp: 18  SpO2: 100%   Filed Weights   10/20/16 0900  Weight: 142 lb 14.4 oz (64.8 kg)    Physical Exam  Constitutional: He is oriented to person, place,  and time.  Chronically ill appearing male in no acute distress   HENT:  Head: Normocephalic.  Mouth/Throat: Oropharynx is clear and moist. No oropharyngeal exudate.  Eyes: Pupils are equal, round, and reactive to light. No scleral icterus.  (L) eye deviation   Neck: Normal range of motion. Neck supple.  Cardiovascular: Normal rate and regular rhythm.   Pulmonary/Chest: Effort normal and breath sounds normal. No respiratory distress.  Abdominal: Soft. Bowel sounds are normal. There is no tenderness.  Colostomy in place   Musculoskeletal: He exhibits no edema.  Examined in wheelchair   Lymphadenopathy:    He has no cervical adenopathy.       Right: No supraclavicular adenopathy present.       Left: No supraclavicular adenopathy present.  Neurological: He is alert and oriented to person, place, and time.  Resting tremor to bilat hands, as well as head/neck.   Skin: Skin is warm and dry. No rash noted.  Psychiatric: Mood and affect normal.  Nursing note and vitals reviewed.    LABORATORY DATA:  I have reviewed the labs as listed.  CBC      Component Value Date/Time   WBC 8.0 09/18/2016 0927   RBC 4.52 09/18/2016 0927   HGB 13.3 09/18/2016 0927   HCT 42.0 09/18/2016 0927   PLT 63 (L) 09/18/2016 0927   MCV 92.9 09/18/2016 0927   MCH 29.4 09/18/2016 0927   MCHC 31.7 09/18/2016 0927   RDW 16.0 (H) 09/18/2016 0927   LYMPHSABS 1.3 09/18/2016 0927   MONOABS 1.1 (H) 09/18/2016 0927   EOSABS 0.3 09/18/2016 0927   BASOSABS 0.0 09/18/2016 0927   CMP Latest Ref Rng & Units 09/18/2016 06/20/2016 06/01/2016  Glucose 65 - 99 mg/dL 91 78 87  BUN 6 - 20 mg/dL 21(H) 21(H) 19  Creatinine 0.61 - 1.24 mg/dL 0.76 0.73 0.85  Sodium 135 - 145 mmol/L 140 141 140  Potassium 3.5 - 5.1 mmol/L 4.1 3.8 3.9  Chloride 101 - 111 mmol/L 98(L) 103 101  CO2 22 - 32 mmol/L 33(H) 29 30  Calcium 8.9 - 10.3 mg/dL 9.5 9.2 9.6  Total Protein 6.5 - 8.1 g/dL 8.0 - -  Total Bilirubin 0.3 - 1.2 mg/dL 0.3 - -  Alkaline Phos 38 - 126 U/L 82 - -  AST 15 - 41 U/L 20 - -  ALT 17 - 63 U/L 13(L) - -   Results for NGAI, PARCELL (MRN [id number])  Ref. Range 09/18/2016 09:29  Total Protein ELP Latest Ref Range: 6.0 - 8.5 g/dL 7.4  Albumin ELP Latest Ref Range: 2.9 - 4.4 g/dL 3.6  Globulin, Total Latest Ref Range: 2.2 - 3.9 g/dL 3.8  A/G Ratio Latest Ref Range: 0.7 - 1.7  0.9  Alpha-1-Globulin Latest Ref Range: 0.0 - 0.4 g/dL 0.3  Alpha-2-Globulin Latest Ref Range: 0.4 - 1.0 g/dL 0.9  Beta Globulin Latest Ref Range: 0.7 - 1.3 g/dL 1.3  Gamma Globulin Latest Ref Range: 0.4 - 1.8 g/dL 1.3  M-SPIKE, % Latest Ref Range: Not Observed g/dL Not Observed   Results for CHA, GOMILLION (MRN [id number])   Ref. Range 09/18/2016 09:28  IgG (Immunoglobin G), Serum Latest Ref Range: 700 - 1,600 mg/dL 1,248  IgA Latest Ref Range: 90 - 386 mg/dL 621 (H)  IgM (Immunoglobulin M), Srm Latest Ref Range: 20 - 172 mg/dL 76  Results for DHRUV, CHRISTINA (MRN [id number])   Ref. Range 09/18/2016 09:28  Folate Latest Ref Range: >5.9 ng/mL 31.0  Vitamin B12  Latest Ref Range: 180 - 914  pg/mL 970 (H)  Results for DEMARKUS, REMMEL (MRN [id number])   Ref. Range 09/18/2016 09:27  Copper Latest Ref Range: 72 - 166 ug/dL 116  Results for HARPER, SMOKER (MRN [id number])   Ref. Range 09/18/2016 09:27  Hep A Ab, IgM Latest Ref Range: Negative  Negative  Hepatitis B Surface Ag Latest Ref Range: Negative  Negative  Hep B Core Ab, IgM Latest Ref Range: Negative  Negative  HCV Ab Latest Ref Range: 0.0 - 0.9 s/co ratio <0.1                      PENDING LABS:    DIAGNOSTIC IMAGING:  *The following radiologic images and reports have been reviewed independently and agree with below findings.  Ultrasound abd: 09/18/16 CLINICAL DATA:  Thrombocytopenia.  Assess liver and spleen.  EXAM: ABDOMEN ULTRASOUND COMPLETE  COMPARISON:  CT 09/01/2014  FINDINGS: Gallbladder: No gallstones or wall thickening visualized. No sonographic Murphy sign noted by sonographer.  Common bile duct: Diameter: 4 mm common normal  Liver: Slightly nodular liver surface raising the question of early cirrhosis. This is not definite. No focal lesion is seen. Echogenicity is slightly increased. No ductal dilatation is seen. No ductal shadowing seen to suggest air/gas in the biliary tree as was seen on the previous CT of 2016. Portal vein is patent on color Doppler imaging with normal direction of blood flow towards the liver.  IVC: No abnormality visualized.  Pancreas: Not seen because of overlying bowel gas.  Spleen: Normal size, measuring 8.3 cm in length.  No focal lesions.  Right Kidney: Length: 10 cm. Echogenicity within normal limits. No mass or hydronephrosis visualized.  Left Kidney: Length: 10.8 cm. Echogenicity within normal limits. No mass or hydronephrosis visualized.  Abdominal aorta: Maximal diameter 2.3 cm. Distal aorta not seen because of bowel gas.  Other findings: No ascites  IMPRESSION: The spleen is normal in size. No hepatomegaly. Question  slightly nodular liver surface which could be seen with early cirrhosis. No evidence of gallbladder or ductal pathology. Portal vein flow normal direction.   Electronically Signed   By: Nelson Chimes M.D.   On: 09/18/2016 10:33    PATHOLOGY:     ASSESSMENT & PLAN:   Thrombocytopenia:  -JAK2 V617F and exons 12-15 negative; CALR negative; MPL negative. These findings make myelofibrosis less likely as cause of thrombocytopenia.  Ultrasound negative for hepatosplenomegaly; slight nodularity appreciated in liver indicative of possible early cirrhosis. Hep C antibody negative. Hep B surface antigen and antibody negative as well. Vitamin B12 mildly elevated; folate and copper levels normal.  -Will obtain labs today. Per Dr. Laverle Patter recommendations documented in her previous note, if platelet count stays >60,000 then will continue observation.  If platelet count <60,000, then may need to start therapy for possible ITP.   -Return to cancer center in 2 months for follow-up with labs.   Addendum:      *Labs resulted after patient had left clinic.  Plts much improved from 1 month ago and are now 93,000.  Will continue to monitor.   Polyclonal gammopathy:  -SPEP normal and without M-spike, so not concerning for myeloma.  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  This is not likely contributing to patient's thrombocytopenia and we can continue to monitor at this time.   Health maintenance:  -Annual flu vaccine given today by nursing per patient's request.  Dispo:  -Labs today.  -Return to cancer center in 2 months for follow-up with labs.    All questions were answered to patient's stated satisfaction. Encouraged patient to call with any new concerns or questions before his next visit to the cancer center and we can certain see him sooner, if needed.    Plan of care discussed with Dr. Lebron Conners, who agrees with the above aforementioned.     Orders placed this encounter:  Orders Placed This Encounter  Procedures  . CBC with Differential/Platelet  . Comprehensive metabolic panel  . CBC with Differential/Platelet  . Comprehensive metabolic panel      Mike Craze, NP [place] [phone]

## 2016-10-20 NOTE — Progress Notes (Signed)
Steven David presents today for injection per MD orders. fluarix administered IM in right deltoid. Administration without incident. Patient tolerated well. Patient discharged via wheelchair and in stable condition with caregiver. Patient to follow up as scheduled.

## 2016-10-24 DIAGNOSIS — M79674 Pain in right toe(s): Secondary | ICD-10-CM | POA: Diagnosis not present

## 2016-10-24 DIAGNOSIS — B351 Tinea unguium: Secondary | ICD-10-CM | POA: Diagnosis not present

## 2016-10-24 DIAGNOSIS — M79675 Pain in left toe(s): Secondary | ICD-10-CM | POA: Diagnosis not present

## 2016-11-14 DIAGNOSIS — F209 Schizophrenia, unspecified: Secondary | ICD-10-CM | POA: Diagnosis not present

## 2016-11-21 DIAGNOSIS — E785 Hyperlipidemia, unspecified: Secondary | ICD-10-CM | POA: Diagnosis not present

## 2016-11-21 DIAGNOSIS — G40909 Epilepsy, unspecified, not intractable, without status epilepticus: Secondary | ICD-10-CM | POA: Diagnosis not present

## 2016-11-21 DIAGNOSIS — F209 Schizophrenia, unspecified: Secondary | ICD-10-CM | POA: Diagnosis not present

## 2016-12-21 ENCOUNTER — Encounter (HOSPITAL_COMMUNITY): Payer: Medicare Other | Attending: Oncology | Admitting: Oncology

## 2016-12-21 ENCOUNTER — Encounter (HOSPITAL_COMMUNITY): Payer: Self-pay | Admitting: Oncology

## 2016-12-21 ENCOUNTER — Encounter (HOSPITAL_COMMUNITY): Payer: Medicare Other

## 2016-12-21 ENCOUNTER — Other Ambulatory Visit: Payer: Self-pay

## 2016-12-21 VITALS — BP 94/54 | HR 74 | Resp 16

## 2016-12-21 DIAGNOSIS — D890 Polyclonal hypergammaglobulinemia: Secondary | ICD-10-CM | POA: Diagnosis not present

## 2016-12-21 DIAGNOSIS — E079 Disorder of thyroid, unspecified: Secondary | ICD-10-CM | POA: Insufficient documentation

## 2016-12-21 DIAGNOSIS — E7800 Pure hypercholesterolemia, unspecified: Secondary | ICD-10-CM | POA: Diagnosis not present

## 2016-12-21 DIAGNOSIS — D472 Monoclonal gammopathy: Secondary | ICD-10-CM | POA: Insufficient documentation

## 2016-12-21 DIAGNOSIS — Z720 Tobacco use: Secondary | ICD-10-CM

## 2016-12-21 DIAGNOSIS — D696 Thrombocytopenia, unspecified: Secondary | ICD-10-CM | POA: Diagnosis not present

## 2016-12-21 DIAGNOSIS — Z8673 Personal history of transient ischemic attack (TIA), and cerebral infarction without residual deficits: Secondary | ICD-10-CM | POA: Diagnosis not present

## 2016-12-21 DIAGNOSIS — Z79899 Other long term (current) drug therapy: Secondary | ICD-10-CM | POA: Diagnosis not present

## 2016-12-21 DIAGNOSIS — F259 Schizoaffective disorder, unspecified: Secondary | ICD-10-CM | POA: Insufficient documentation

## 2016-12-21 DIAGNOSIS — Z9889 Other specified postprocedural states: Secondary | ICD-10-CM | POA: Insufficient documentation

## 2016-12-21 DIAGNOSIS — F17200 Nicotine dependence, unspecified, uncomplicated: Secondary | ICD-10-CM | POA: Insufficient documentation

## 2016-12-21 DIAGNOSIS — Z933 Colostomy status: Secondary | ICD-10-CM | POA: Insufficient documentation

## 2016-12-21 LAB — CBC WITH DIFFERENTIAL/PLATELET
BASOS ABS: 0 10*3/uL (ref 0.0–0.1)
BASOS PCT: 1 %
EOS PCT: 7 %
Eosinophils Absolute: 0.5 10*3/uL (ref 0.0–0.7)
HCT: 39.8 % (ref 39.0–52.0)
Hemoglobin: 12.2 g/dL — ABNORMAL LOW (ref 13.0–17.0)
LYMPHS PCT: 16 %
Lymphs Abs: 1.2 10*3/uL (ref 0.7–4.0)
MCH: 28.8 pg (ref 26.0–34.0)
MCHC: 30.7 g/dL (ref 30.0–36.0)
MCV: 94.1 fL (ref 78.0–100.0)
Monocytes Absolute: 0.7 10*3/uL (ref 0.1–1.0)
Monocytes Relative: 9 %
Neutro Abs: 5.2 10*3/uL (ref 1.7–7.7)
Neutrophils Relative %: 67 %
PLATELETS: 90 10*3/uL — AB (ref 150–400)
RBC: 4.23 MIL/uL (ref 4.22–5.81)
RDW: 15.9 % — ABNORMAL HIGH (ref 11.5–15.5)
WBC: 7.7 10*3/uL (ref 4.0–10.5)

## 2016-12-21 LAB — COMPREHENSIVE METABOLIC PANEL
ALBUMIN: 3.6 g/dL (ref 3.5–5.0)
ALT: 10 U/L — AB (ref 17–63)
AST: 18 U/L (ref 15–41)
Alkaline Phosphatase: 77 U/L (ref 38–126)
Anion gap: 8 (ref 5–15)
BUN: 22 mg/dL — AB (ref 6–20)
CHLORIDE: 100 mmol/L — AB (ref 101–111)
CO2: 32 mmol/L (ref 22–32)
Calcium: 9.2 mg/dL (ref 8.9–10.3)
Creatinine, Ser: 0.99 mg/dL (ref 0.61–1.24)
GFR calc Af Amer: >60 mL/min (ref >60)
GFR calc non Af Amer: >60 mL/min (ref >60)
GLUCOSE: 91 mg/dL (ref 65–99)
POTASSIUM: 4.1 mmol/L (ref 3.5–5.1)
SODIUM: 140 mmol/L (ref 135–145)
Total Bilirubin: 0.5 mg/dL (ref 0.3–1.2)
Total Protein: 8 g/dL (ref 6.5–8.1)

## 2016-12-21 NOTE — Progress Notes (Signed)
[facility] Initial Visit:  Patient Care Team: Hilbert Corrigan, MD as PCP - General (Internal Medicine)  CHIEF COMPLAINTS/PURPOSE OF CONSULTATION:  Thrombocytopenia  HISTORY OF PRESENTING ILLNESS: Steven David 61 y.o. male is here because of thrombocytopenia. Patient is currently a resident at [facility] and [facility]. I have limited medical records from his current facility. Per his records he has a history of thrombocytopenia since 2014. It has been stable since 2017. There is no history of cirrhosis or splenomegaly per his medical records. Patient has dysarthria and underlying cognitive deficit which makes it difficult to get any history from him. He had lab work performed on 05/21/16 which demonstrated WBC 9.5K, hemoglobin 13.3 g/dL, hematocrit 42.2%, platelet count 68K.  INTERVAL HISTORY: Patient presents today for continued follow up. His platelet count is 90k today. His caretaker who accompanied him today states he has been doing well. No bleeding. No bruising. No chest pain, shortness of breath, abdominal pain, focal weakness.   Review of Systems - Oncology Very difficult to obtain. Patient denies any chest pain, shortness of breath, abdominal pain.  MEDICAL HISTORY: Past Medical History:  Diagnosis Date  . Aggressive outburst   . Cognitive communication deficit   . Colostomy in place [facility])   . Drug-induced Parkinsonism ([place])   . Dysphagia   . Dysphagia   . Hard of hearing    Wears bilateral hearing aides  . Hernia   . High cholesterol   . HOH (hard of hearing)   . Malnutrition ([place])   . Malnutrition ([place])   . Schizoaffective disorder ([place])   . Schizophrenia ([place])   . Seizures ([place])   . Thyroid disease   . TIA (transient ischemic attack)     SURGICAL HISTORY: Past Surgical History:  Procedure Laterality Date  . ESOPHAGOGASTRODUODENOSCOPY (EGD) WITH PROPOFOL N/A 09/08/2014   Procedure:  ESOPHAGOGASTRODUODENOSCOPY (EGD) WITH PROPOFOL;  Surgeon: Georganna Skeans, MD;  Location: [place];  Service: Endoscopy;  Laterality: N/A;  . LAPAROTOMY N/A 09/01/2014   Procedure: EXPLORATORY LAPAROTOMY, SIGMOID COLECTOMY, SIGMOID COLOSTOMY, PRIMARY REPAIR OF STRANGULATED INGUINAL HERNIA.;  Surgeon: Rolm Bookbinder, MD;  Location: [place];  Service: General;  Laterality: N/A;  . LEG SURGERY    . PEG PLACEMENT N/A 09/08/2014   Procedure: PERCUTANEOUS ENDOSCOPIC GASTROSTOMY (PEG) PLACEMENT;  Surgeon: Georganna Skeans, MD;  Location: [place];  Service: Endoscopy;  Laterality: N/A;  . PEG PLACEMENT N/A 06/30/2015   Procedure: PERCUTANEOUS ENDOSCOPIC GASTROSTOMY (PEG) REPLACEMENT;  Surgeon: Daneil Dolin, MD;  [facility];  Service: Endoscopy;  Laterality: N/A;  1100  . PEG PLACEMENT N/A 12/30/2015   Procedure: PERCUTANEOUS ENDOSCOPIC GASTROSTOMY (PEG) REPLACEMENT;  Surgeon: Daneil Dolin, MD;  [facility];  Service: Endoscopy;  Laterality: N/A;  . PEG PLACEMENT N/A 08/24/2016   Procedure: PERCUTANEOUS ENDOSCOPIC GASTROSTOMY (PEG) REPLACEMENT;  Surgeon: Daneil Dolin, MD;  [facility];  Service: Endoscopy;  Laterality: N/A;  . TESTICLE SURGERY      SOCIAL HISTORY: Social History   Socioeconomic History  . Marital status: Single    Spouse name: Not on file  . Number of children: Not on file  . Years of education: Not on file  . Highest education level: Not on file  Social Needs  . Financial resource strain: Not on file  . Food insecurity - worry: Not on file  . Food insecurity - inability: Not on file  . Transportation needs - medical: Not on  file  . Transportation needs - non-medical: Not on file  Occupational History  . Not on file  Tobacco Use  . Smoking status: Current Every Day Smoker  . Smokeless tobacco: Never Used  Substance and Sexual Activity  . Alcohol use: No  . Drug use: No  . Sexual activity: No  Other Topics Concern  . Not on  file  Social History Narrative  . Not on file    FAMILY HISTORY Family History  Family history unknown: Yes    ALLERGIES:  has No Known Allergies.  MEDICATIONS:  Current Outpatient Medications  Medication Sig Dispense Refill  . acetaminophen (TYLENOL) 325 MG tablet Place 650 mg into feeding tube every 6 (six) hours as needed for moderate pain or fever.    . cholecalciferol (VITAMIN D) 1000 units tablet Take 1,000 Units by mouth daily.    . divalproex (DEPAKOTE ER) 500 MG 24 hr tablet Take one (1) tablet by mouth twice a day    . ferrous sulfate 325 (65 FE) MG tablet 325 mg daily with breakfast. Per tube    . furosemide (LASIX) 20 MG tablet Place 10 mg into feeding tube 2 (two) times daily.     Marland Kitchen guaifenesin (HUMIBID E) 400 MG TABS tablet 400 mg every 4 (four) hours. Per tube    . haloperidol (HALDOL) 10 MG tablet Take one (1) tablet by mouth at bedtime    . haloperidol decanoate (HALDOL DECANOATE) 50 MG/ML injection Inject one (1) milliliter into the muscle every 4 weeks    . levETIRAcetam (KEPPRA) 250 MG tablet 250 mg 2 (two) times daily. Per tube    . levothyroxine (SYNTHROID, LEVOTHROID) 100 MCG tablet Take 100 mcg by mouth daily before breakfast.    . loratadine (CLARITIN) 10 MG tablet Place 1 tablet (10 mg total) into feeding tube daily.    Marland Kitchen lovastatin (MEVACOR) 20 MG tablet Place 1 tablet (20 mg total) into feeding tube daily.    . Nutritional Supplements (FEEDING SUPPLEMENT, JEVITY 1.5 CAL/FIBER,) LIQD Place 1,000 mLs into feeding tube continuous. Initiate nocturnal feedings of Jevity 1.5 @ 100 ml/hr via PEG over 14 hour period    . OLANZapine zydis (ZYPREXA) 5 MG disintegrating tablet Take 5 mg by mouth at bedtime.    . polyethylene glycol (MIRALAX / GLYCOLAX) packet Take 17 g by mouth daily.    . vitamin C (ASCORBIC ACID) 500 MG tablet Take 500 mg by mouth daily.     No current facility-administered medications for this visit.     PHYSICAL EXAMINATION:   Vitals:    12/21/16 1141  BP: (!) 94/54  Pulse: 74  Resp: 16  SpO2: 99%    Physical Exam Constitutional: Well-developed, well-nourished, and in no distress.   HENT: Extremely hard of hearing. Head: Normocephalic and atraumatic.  Mouth/Throat: No oropharyngeal exudate. Mucosa moist. Eyes: Left eye strabismus. No scleral icterus.  Neck: Normal range of motion. Neck supple. No JVD present.  Cardiovascular: Normal rate, regular rhythm and normal heart sounds.  Exam reveals no gallop and no friction rub.   No murmur heard. Pulmonary/Chest: Effort normal and breath sounds normal. No respiratory distress. No wheezes.No rales.  Abdominal: Soft. +PEG tube. +colostomy. There is no tenderness. There is no guarding.  Musculoskeletal: No edema or tenderness.  Lymphadenopathy:    No cervical or supraclavicular adenopathy.  Neurological: Patient has a resting tremor.  Skin: Skin is warm and dry. No rash noted. No erythema. No pallor.     LABORATORY DATA:  I have personally reviewed the data as listed:  Appointment on 12/21/2016  Component Date Value Ref Range Status  . WBC 12/21/2016 7.7  4.0 - 10.5 K/uL Final  . RBC 12/21/2016 4.23  4.22 - 5.81 MIL/uL Final  . Hemoglobin 12/21/2016 12.2* 13.0 - 17.0 g/dL Final  . HCT 12/21/2016 39.8  39.0 - 52.0 % Final  . MCV 12/21/2016 94.1  78.0 - 100.0 fL Final  . MCH 12/21/2016 28.8  26.0 - 34.0 pg Final  . MCHC 12/21/2016 30.7  30.0 - 36.0 g/dL Final  . RDW 12/21/2016 15.9* 11.5 - 15.5 % Final  . Platelets 12/21/2016 90* 150 - 400 K/uL Final   Comment: SPECIMEN CHECKED FOR CLOTS PLATELET COUNT CONFIRMED BY SMEAR   . Neutrophils Relative % 12/21/2016 67  % Final  . Neutro Abs 12/21/2016 5.2  1.7 - 7.7 K/uL Final  . Lymphocytes Relative 12/21/2016 16  % Final  . Lymphs Abs 12/21/2016 1.2  0.7 - 4.0 K/uL Final  . Monocytes Relative 12/21/2016 9  % Final  . Monocytes Absolute 12/21/2016 0.7  0.1 - 1.0 K/uL Final  . Eosinophils Relative 12/21/2016 7  % Final   . Eosinophils Absolute 12/21/2016 0.5  0.0 - 0.7 K/uL Final  . Basophils Relative 12/21/2016 1  % Final  . Basophils Absolute 12/21/2016 0.0  0.0 - 0.1 K/uL Final  . Sodium 12/21/2016 140  135 - 145 mmol/L Final  . Potassium 12/21/2016 4.1  3.5 - 5.1 mmol/L Final  . Chloride 12/21/2016 100* 101 - 111 mmol/L Final  . CO2 12/21/2016 32  22 - 32 mmol/L Final  . Glucose, Bld 12/21/2016 91  65 - 99 mg/dL Final  . BUN 12/21/2016 22* 6 - 20 mg/dL Final  . Creatinine, Ser 12/21/2016 0.99  0.61 - 1.24 mg/dL Final  . Calcium 12/21/2016 9.2  8.9 - 10.3 mg/dL Final  . Total Protein 12/21/2016 8.0  6.5 - 8.1 g/dL Final  . Albumin 12/21/2016 3.6  3.5 - 5.0 g/dL Final  . AST 12/21/2016 18  15 - 41 U/L Final  . ALT 12/21/2016 10* 17 - 63 U/L Final  . Alkaline Phosphatase 12/21/2016 77  38 - 126 U/L Final  . Total Bilirubin 12/21/2016 0.5  0.3 - 1.2 mg/dL Final  . GFR calc non Af Amer 12/21/2016 >60  >60 mL/min Final  . GFR calc Af Amer 12/21/2016 >60  >60 mL/min Final   Comment: (NOTE) The eGFR has been calculated using the CKD EPI equation. This calculation has not been validated in all clinical situations. eGFR's persistently <60 mL/min signify possible Chronic Kidney Disease.   . Anion gap 12/21/2016 8  5 - 15 Final    RADIOGRAPHIC STUDIES: I have personally reviewed the radiological images as listed and agree with the findings in the report  No results found.  ASSESSMENT/PLAN Thrombocytopenia-chronic since 2014 JAK2 V617F and exons 12-15 negative; CALR negative; MPL negative. These findings make myelofibrosis less likely as cause of thrombocytopenia.  Ultrasound negative for hepatosplenomegaly; slight nodularity appreciated in liver indicative of possible early cirrhosis. Hep C antibody negative. Hep B surface antigen and antibody negative as well. Vitamin B12 mildly elevated; folate and copper levels normal.   Polyclonal gammopathy:  -SPEP normal and without M-spike, so not concerning  for myeloma.  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  This is not likely contributing to patient's thrombocytopenia and we can continue to monitor at this time.  PLAN: Platelets stable. Continue to monitor platelet count.  RTC in 6 months for follow up with CBC.  Orders Placed This Encounter  Procedures  . CBC with Differential    Standing Status:   Future    Standing Expiration Date:   12/21/2017      All questions were answered. The patient knows to call the clinic with any problems, questions or concerns.  This note was electronically signed.    Twana First, MD  12/21/2016 3:22 PM

## 2017-01-04 DIAGNOSIS — M6281 Muscle weakness (generalized): Secondary | ICD-10-CM | POA: Diagnosis not present

## 2017-01-04 DIAGNOSIS — Z8673 Personal history of transient ischemic attack (TIA), and cerebral infarction without residual deficits: Secondary | ICD-10-CM | POA: Diagnosis not present

## 2017-01-04 DIAGNOSIS — R2681 Unsteadiness on feet: Secondary | ICD-10-CM | POA: Diagnosis not present

## 2017-01-05 DIAGNOSIS — M6281 Muscle weakness (generalized): Secondary | ICD-10-CM | POA: Diagnosis not present

## 2017-01-05 DIAGNOSIS — R2681 Unsteadiness on feet: Secondary | ICD-10-CM | POA: Diagnosis not present

## 2017-01-05 DIAGNOSIS — Z8673 Personal history of transient ischemic attack (TIA), and cerebral infarction without residual deficits: Secondary | ICD-10-CM | POA: Diagnosis not present

## 2017-01-08 DIAGNOSIS — Z8673 Personal history of transient ischemic attack (TIA), and cerebral infarction without residual deficits: Secondary | ICD-10-CM | POA: Diagnosis not present

## 2017-01-08 DIAGNOSIS — R2681 Unsteadiness on feet: Secondary | ICD-10-CM | POA: Diagnosis not present

## 2017-01-08 DIAGNOSIS — M6281 Muscle weakness (generalized): Secondary | ICD-10-CM | POA: Diagnosis not present

## 2017-02-15 ENCOUNTER — Other Ambulatory Visit: Payer: Self-pay

## 2017-02-15 ENCOUNTER — Encounter (HOSPITAL_COMMUNITY): Payer: Self-pay | Admitting: Emergency Medicine

## 2017-02-15 ENCOUNTER — Other Ambulatory Visit (HOSPITAL_COMMUNITY): Payer: Self-pay

## 2017-02-15 ENCOUNTER — Emergency Department (HOSPITAL_COMMUNITY): Payer: Medicare Other

## 2017-02-15 ENCOUNTER — Observation Stay (HOSPITAL_COMMUNITY)
Admission: EM | Admit: 2017-02-15 | Discharge: 2017-02-16 | Disposition: A | Discharge status: Home / Self Care | Payer: Medicare Other | Attending: Internal Medicine | Admitting: Internal Medicine

## 2017-02-15 DIAGNOSIS — E038 Other specified hypothyroidism: Secondary | ICD-10-CM

## 2017-02-15 DIAGNOSIS — R569 Unspecified convulsions: Secondary | ICD-10-CM | POA: Diagnosis not present

## 2017-02-15 DIAGNOSIS — F209 Schizophrenia, unspecified: Secondary | ICD-10-CM | POA: Diagnosis present

## 2017-02-15 DIAGNOSIS — E039 Hypothyroidism, unspecified: Secondary | ICD-10-CM | POA: Diagnosis not present

## 2017-02-15 DIAGNOSIS — R471 Dysarthria and anarthria: Secondary | ICD-10-CM | POA: Diagnosis present

## 2017-02-15 DIAGNOSIS — J189 Pneumonia, unspecified organism: Secondary | ICD-10-CM | POA: Diagnosis not present

## 2017-02-15 DIAGNOSIS — K4090 Unilateral inguinal hernia, without obstruction or gangrene, not specified as recurrent: Secondary | ICD-10-CM | POA: Diagnosis not present

## 2017-02-15 DIAGNOSIS — F203 Undifferentiated schizophrenia: Secondary | ICD-10-CM | POA: Diagnosis not present

## 2017-02-15 DIAGNOSIS — Z7982 Long term (current) use of aspirin: Secondary | ICD-10-CM | POA: Diagnosis not present

## 2017-02-15 DIAGNOSIS — R531 Weakness: Secondary | ICD-10-CM | POA: Diagnosis present

## 2017-02-15 DIAGNOSIS — F17210 Nicotine dependence, cigarettes, uncomplicated: Secondary | ICD-10-CM | POA: Diagnosis not present

## 2017-02-15 DIAGNOSIS — D696 Thrombocytopenia, unspecified: Secondary | ICD-10-CM | POA: Diagnosis not present

## 2017-02-15 DIAGNOSIS — Z79899 Other long term (current) drug therapy: Secondary | ICD-10-CM | POA: Diagnosis not present

## 2017-02-15 LAB — COMPREHENSIVE METABOLIC PANEL
ALT: 10 U/L — ABNORMAL LOW (ref 17–63)
ANION GAP: 13 (ref 5–15)
AST: 21 U/L (ref 15–41)
Albumin: 3.6 g/dL (ref 3.5–5.0)
Alkaline Phosphatase: 66 U/L (ref 38–126)
BILIRUBIN TOTAL: 0.4 mg/dL (ref 0.3–1.2)
BUN: 22 mg/dL — ABNORMAL HIGH (ref 6–20)
CHLORIDE: 98 mmol/L — AB (ref 101–111)
CO2: 27 mmol/L (ref 22–32)
Calcium: 9.2 mg/dL (ref 8.9–10.3)
Creatinine, Ser: 1.2 mg/dL (ref 0.61–1.24)
GFR calc Af Amer: >60 mL/min (ref >60)
Glucose, Bld: 108 mg/dL — ABNORMAL HIGH (ref 65–99)
POTASSIUM: 3.7 mmol/L (ref 3.5–5.1)
Sodium: 138 mmol/L (ref 135–145)
TOTAL PROTEIN: 8.3 g/dL — AB (ref 6.5–8.1)

## 2017-02-15 LAB — URINALYSIS, ROUTINE W REFLEX MICROSCOPIC
Bilirubin Urine: NEGATIVE
Glucose, UA: NEGATIVE mg/dL
HGB URINE DIPSTICK: NEGATIVE
Ketones, ur: NEGATIVE mg/dL
Leukocytes, UA: NEGATIVE
Nitrite: NEGATIVE
PH: 5 (ref 5.0–8.0)
Protein, ur: NEGATIVE mg/dL
Specific Gravity, Urine: 1.017 (ref 1.005–1.030)

## 2017-02-15 LAB — CBC WITH DIFFERENTIAL/PLATELET
BASOS ABS: 0 10*3/uL (ref 0.0–0.1)
Basophils Relative: 0 %
EOS PCT: 2 %
Eosinophils Absolute: 0.2 10*3/uL (ref 0.0–0.7)
HEMATOCRIT: 38.5 % — AB (ref 39.0–52.0)
HEMOGLOBIN: 12.3 g/dL — AB (ref 13.0–17.0)
LYMPHS ABS: 0.8 10*3/uL (ref 0.7–4.0)
LYMPHS PCT: 10 %
MCH: 28.7 pg (ref 26.0–34.0)
MCHC: 31.9 g/dL (ref 30.0–36.0)
MCV: 90 fL (ref 78.0–100.0)
Monocytes Absolute: 1.6 10*3/uL — ABNORMAL HIGH (ref 0.1–1.0)
Monocytes Relative: 21 %
NEUTROS ABS: 5.1 10*3/uL (ref 1.7–7.7)
NEUTROS PCT: 66 %
PLATELETS: 73 10*3/uL — AB (ref 150–400)
RBC: 4.28 MIL/uL (ref 4.22–5.81)
RDW: 15.6 % — ABNORMAL HIGH (ref 11.5–15.5)
WBC: 7.7 10*3/uL (ref 4.0–10.5)

## 2017-02-15 LAB — LACTIC ACID, PLASMA: LACTIC ACID, VENOUS: 1.2 mmol/L (ref 0.5–1.9)

## 2017-02-15 LAB — MRSA PCR SCREENING: MRSA by PCR: NEGATIVE

## 2017-02-15 LAB — PROTIME-INR
INR: 1.05
Prothrombin Time: 13.7 seconds (ref 11.4–15.2)

## 2017-02-15 MED ORDER — VANCOMYCIN HCL IN DEXTROSE 1-5 GM/200ML-% IV SOLN
1000.0000 mg | Freq: Once | INTRAVENOUS | Status: AC
  Administered 2017-02-15: 1000 mg via INTRAVENOUS
  Filled 2017-02-15: qty 200

## 2017-02-15 MED ORDER — IPRATROPIUM-ALBUTEROL 0.5-2.5 (3) MG/3ML IN SOLN
3.0000 mL | Freq: Three times a day (TID) | RESPIRATORY_TRACT | Status: DC
  Administered 2017-02-16 (×3): 3 mL via RESPIRATORY_TRACT
  Filled 2017-02-15 (×3): qty 3

## 2017-02-15 MED ORDER — ASPIRIN 325 MG PO TABS
325.0000 mg | ORAL_TABLET | Freq: Every day | ORAL | Status: DC
  Administered 2017-02-15 – 2017-02-16 (×2): 325 mg via ORAL
  Filled 2017-02-15 (×3): qty 1

## 2017-02-15 MED ORDER — PRAVASTATIN SODIUM 10 MG PO TABS
20.0000 mg | ORAL_TABLET | Freq: Every day | ORAL | Status: DC
  Administered 2017-02-15 – 2017-02-16 (×2): 20 mg via ORAL
  Filled 2017-02-15 (×2): qty 2

## 2017-02-15 MED ORDER — DEXTROSE 5 % IV SOLN
1.0000 g | Freq: Three times a day (TID) | INTRAVENOUS | Status: DC
  Filled 2017-02-15 (×3): qty 1

## 2017-02-15 MED ORDER — SODIUM CHLORIDE 0.9 % IV SOLN
INTRAVENOUS | Status: DC
  Administered 2017-02-15 – 2017-02-16 (×2): via INTRAVENOUS

## 2017-02-15 MED ORDER — POLYETHYLENE GLYCOL 3350 17 G PO PACK
17.0000 g | PACK | Freq: Every day | ORAL | Status: DC
  Administered 2017-02-16 (×2): 17 g via ORAL
  Filled 2017-02-15 (×3): qty 1

## 2017-02-15 MED ORDER — LEVOTHYROXINE SODIUM 100 MCG PO TABS
100.0000 ug | ORAL_TABLET | Freq: Every day | ORAL | Status: DC
  Administered 2017-02-16: 100 ug via ORAL
  Filled 2017-02-15 (×2): qty 1

## 2017-02-15 MED ORDER — SODIUM CHLORIDE 0.9 % IV BOLUS (SEPSIS)
1000.0000 mL | Freq: Once | INTRAVENOUS | Status: AC
  Administered 2017-02-15: 1000 mL via INTRAVENOUS

## 2017-02-15 MED ORDER — CEFEPIME HCL 2 G IJ SOLR
2.0000 g | Freq: Once | INTRAMUSCULAR | Status: AC
  Administered 2017-02-15: 2 g via INTRAVENOUS
  Filled 2017-02-15: qty 2

## 2017-02-15 MED ORDER — DIVALPROEX SODIUM 125 MG PO CSDR
625.0000 mg | DELAYED_RELEASE_CAPSULE | Freq: Two times a day (BID) | ORAL | Status: DC
  Administered 2017-02-15 – 2017-02-16 (×2): 625 mg via ORAL
  Filled 2017-02-15 (×9): qty 5

## 2017-02-15 MED ORDER — IPRATROPIUM-ALBUTEROL 0.5-2.5 (3) MG/3ML IN SOLN
3.0000 mL | Freq: Four times a day (QID) | RESPIRATORY_TRACT | Status: DC
  Administered 2017-02-15: 3 mL via RESPIRATORY_TRACT
  Filled 2017-02-15: qty 3

## 2017-02-15 MED ORDER — GUAIFENESIN 100 MG/5ML PO SOLN
400.0000 mg | Freq: Two times a day (BID) | ORAL | Status: DC
  Administered 2017-02-15 – 2017-02-16 (×2): 400 mg via ORAL
  Filled 2017-02-15 (×2): qty 5
  Filled 2017-02-15: qty 15
  Filled 2017-02-15: qty 20

## 2017-02-15 MED ORDER — HALOPERIDOL 5 MG PO TABS
10.0000 mg | ORAL_TABLET | Freq: Every day | ORAL | Status: DC
  Administered 2017-02-15: 10 mg via ORAL
  Filled 2017-02-15: qty 2

## 2017-02-15 MED ORDER — AMPICILLIN-SULBACTAM SODIUM 3 (2-1) G IJ SOLR
3.0000 g | Freq: Four times a day (QID) | INTRAMUSCULAR | Status: DC
  Administered 2017-02-15 – 2017-02-16 (×4): 3 g via INTRAVENOUS
  Filled 2017-02-15 (×17): qty 3

## 2017-02-15 MED ORDER — JEVITY 1.5 CAL/FIBER PO LIQD
1000.0000 mL | ORAL | Status: DC
  Administered 2017-02-15: 1000 mL
  Filled 2017-02-15 (×11): qty 1000

## 2017-02-15 MED ORDER — OLANZAPINE 5 MG PO TBDP
5.0000 mg | ORAL_TABLET | Freq: Every day | ORAL | Status: DC
  Administered 2017-02-16: 5 mg via ORAL
  Filled 2017-02-15 (×5): qty 1

## 2017-02-15 MED ORDER — ENSURE ENLIVE PO LIQD
237.0000 mL | Freq: Two times a day (BID) | ORAL | Status: DC
  Administered 2017-02-16: 237 mL via ORAL

## 2017-02-15 MED ORDER — LEVETIRACETAM 250 MG PO TABS
250.0000 mg | ORAL_TABLET | Freq: Two times a day (BID) | ORAL | Status: DC
  Administered 2017-02-15 – 2017-02-16 (×2): 250 mg via ORAL
  Filled 2017-02-15 (×3): qty 1

## 2017-02-15 MED ORDER — OXYBUTYNIN CHLORIDE 5 MG PO TABS
5.0000 mg | ORAL_TABLET | Freq: Two times a day (BID) | ORAL | Status: DC
  Administered 2017-02-15 – 2017-02-16 (×2): 5 mg via ORAL
  Filled 2017-02-15 (×3): qty 1

## 2017-02-15 MED ORDER — ACETAMINOPHEN 325 MG PO TABS: 650.0000 mg | ORAL_TABLET | Freq: Four times a day (QID) | ORAL | Status: DC | PRN

## 2017-02-15 MED ORDER — VANCOMYCIN HCL 500 MG IV SOLR
500.0000 mg | Freq: Two times a day (BID) | INTRAVENOUS | Status: DC
  Administered 2017-02-16 (×2): 500 mg via INTRAVENOUS
  Filled 2017-02-15 (×8): qty 500

## 2017-02-15 NOTE — Progress Notes (Signed)
Pharmacy Antibiotic Note  Steven David is a 62 y.o. male admitted on 02/15/2017 with PNA.  Pharmacy has been consulted for vancomycin and cefepime dosing. Initial doses ordered in the ED  Plan: Continue vanc 500 mg IV q12 hours Cont cefepime 1gm IV q8 hours F/u renal function, cultures and clinical course  Weight: 142 lb (64.4 kg)  Temp (24hrs), Avg:98.4 F (36.9 C), Min:98.4 F (36.9 C), Max:98.4 F (36.9 C)  Recent Labs  Lab 02/15/17 1004  WBC 7.7  CREATININE 1.20  LATICACIDVEN 1.2    Estimated Creatinine Clearance: 60.4 mL/min (by C-G formula based on SCr of 1.2 mg/dL).    No Known Allergies  Antimicrobials this admission: vanc 2/7 >>  cefepime 2/7 >>    Thank you for allowing pharmacy to be a part of this patient's care.  Excell Seltzer [place] 02/15/2017 11:23 AM

## 2017-02-15 NOTE — H&P (Signed)
History and Physical    Steven David VOH:[id number] DOB: 04-27-1955 DOA: 02/15/2017  PCP: Hilbert Corrigan, MD  Patient coming from: [place]  I have personally briefly reviewed patient's old medical records in [place]  Chief Complaint: Fever  HPI: Steven David is a 62 y.o. male with medical history significant of previous strangulated hernia status post sigmoid colectomy/colostomy, schizophrenia, seizures, hypothyroidism, severe dysarthria, dysphasia status post PEG tube placement who is a resident of a nursing facility.  Patient was brought to the hospital today with complaints of fever.  Patient is a difficult historian due to his severe dysarthria.  He apparently has had generalized weakness as well as cough.  Patient does admit to having abdominal pain but it appears that this is a chronic issue.  He has not had any vomiting.  He has been having stool output through colostomy  ED Course: Patient was noted to be hypotensive, but responded to IV fluids.  Chest x-ray indicated possible pneumonia.  He was noted to be thrombocytopenic, but this is a chronic issue.  Lactic acid was normal.  WBC count normal.  He was febrile to 101, rectally.  He was started on intravenous antibiotics and referred for admission.  Review of Systems: History is somewhat limited due to the patient's severe dysarthria.  Difficult to comprehend   Past Medical History:  Diagnosis Date  . Aggressive outburst   . Cognitive communication deficit   . Colostomy in place [facility])   . Drug-induced Parkinsonism ([place])   . Dysphagia   . Dysphagia   . Hard of hearing    Wears bilateral hearing aides  . Hernia   . High cholesterol   . HOH (hard of hearing)   . Malnutrition ([place])   . Malnutrition ([place])   . Schizoaffective disorder ([place])   . Schizophrenia ([place])   . Seizures ([place])   . Thyroid disease   . TIA (transient ischemic attack)     Past Surgical History:  Procedure  Laterality Date  . ESOPHAGOGASTRODUODENOSCOPY (EGD) WITH PROPOFOL N/A 09/08/2014   Procedure: ESOPHAGOGASTRODUODENOSCOPY (EGD) WITH PROPOFOL;  Surgeon: Georganna Skeans, MD;  Location: [place];  Service: Endoscopy;  Laterality: N/A;  . LAPAROTOMY N/A 09/01/2014   Procedure: EXPLORATORY LAPAROTOMY, SIGMOID COLECTOMY, SIGMOID COLOSTOMY, PRIMARY REPAIR OF STRANGULATED INGUINAL HERNIA.;  Surgeon: Rolm Bookbinder, MD;  Location: [place];  Service: General;  Laterality: N/A;  . LEG SURGERY    . PEG PLACEMENT N/A 09/08/2014   Procedure: PERCUTANEOUS ENDOSCOPIC GASTROSTOMY (PEG) PLACEMENT;  Surgeon: Georganna Skeans, MD;  Location: [place];  Service: Endoscopy;  Laterality: N/A;  . PEG PLACEMENT N/A 06/30/2015   Procedure: PERCUTANEOUS ENDOSCOPIC GASTROSTOMY (PEG) REPLACEMENT;  Surgeon: Daneil Dolin, MD;  [facility];  Service: Endoscopy;  Laterality: N/A;  1100  . PEG PLACEMENT N/A 12/30/2015   Procedure: PERCUTANEOUS ENDOSCOPIC GASTROSTOMY (PEG) REPLACEMENT;  Surgeon: Daneil Dolin, MD;  [facility];  Service: Endoscopy;  Laterality: N/A;  . PEG PLACEMENT N/A 08/24/2016   Procedure: PERCUTANEOUS ENDOSCOPIC GASTROSTOMY (PEG) REPLACEMENT;  Surgeon: Daneil Dolin, MD;  [facility];  Service: Endoscopy;  Laterality: N/A;  . TESTICLE SURGERY       reports that he has been smoking.  he has never used smokeless tobacco. He reports that he does not drink alcohol or use drugs.  No Known Allergies  Family History  Family history unknown: Yes  Patient was asked about family history, difficult to comprehend due  to dysarthria.   Prior to Admission medications   Medication Sig Start Date End Date Taking? Authorizing Provider  acetaminophen (TYLENOL) 325 MG tablet Place 650 mg into feeding tube every 6 (six) hours as needed for moderate pain or fever.   Yes [provider]  aspirin 325 MG tablet Take 325 mg by mouth daily.   Yes [provider]    cholecalciferol (VITAMIN D) 1000 units tablet Take 1,000 Units by mouth daily.   Yes [provider]  divalproex (DEPAKOTE SPRINKLE) 125 MG capsule Take 625 mg by mouth 2 (two) times daily.   Yes [provider]  ferrous sulfate 325 (65 FE) MG tablet 325 mg daily with breakfast. Per tube   Yes [provider]  furosemide (LASIX) 20 MG tablet Place 10 mg into feeding tube 2 (two) times daily.    Yes [provider]  guaifenesin (HUMIBID E) 400 MG TABS tablet Take 400 mg by mouth 2 (two) times daily. Per tube    Yes [provider]  haloperidol (HALDOL) 10 MG tablet Take one (1) tablet by mouth at bedtime 11/14/16 03/13/17 Yes [provider]  haloperidol decanoate (HALDOL DECANOATE) 50 MG/ML injection Inject 100 mg into the muscle every 28 (twenty-eight) days.   Yes [provider]  levETIRAcetam (KEPPRA) 250 MG tablet Take 250 mg by mouth 2 (two) times daily. Per tube   Yes [provider]  levothyroxine (SYNTHROID, LEVOTHROID) 100 MCG tablet Take 100 mcg by mouth daily before breakfast.   Yes [provider]  loratadine (CLARITIN) 10 MG tablet Place 1 tablet (10 mg total) into feeding tube daily. 09/10/14  Yes Randon Goldsmith, Megan N, PA-C  lovastatin (MEVACOR) 20 MG tablet Place 1 tablet (20 mg total) into feeding tube daily. 09/10/14  Yes Randon Goldsmith, Megan N, PA-C  Nutritional Supplements (FEEDING SUPPLEMENT, JEVITY 1.5 CAL/FIBER,) LIQD Place 1,000 mLs into feeding tube continuous. Initiate nocturnal feedings of Jevity 1.5 @ 100 ml/hr via PEG over 14 hour period 09/10/14  Yes Randon Goldsmith, Megan N, PA-C  OLANZapine zydis (ZYPREXA) 5 MG disintegrating tablet Take 5 mg by mouth at bedtime.   Yes [provider]  oxybutynin (DITROPAN) 5 MG tablet Take 5 mg by mouth 2 (two) times daily.   Yes [provider]  polyethylene glycol (MIRALAX / GLYCOLAX) packet Take 17 g by mouth daily.   Yes [provider]  vitamin C (ASCORBIC  ACID) 500 MG tablet Take 500 mg by mouth 2 (two) times daily.    Yes [provider]    Physical Exam: Vitals:   02/15/17 1715 02/15/17 1730 02/15/17 1745 02/15/17 1816  BP:  (!) 97/54  (!) 146/92  Pulse: 85   87  Resp: 18 16 14 19   Temp:    98.5 F (36.9 C)  TempSrc:    Oral  SpO2: 95%   100%  Weight:    65.9 kg (145 lb 4.5 oz)  Height:    5\' 9"  (1.753 m)    Constitutional: NAD, calm, comfortable Vitals:   02/15/17 1715 02/15/17 1730 02/15/17 1745 02/15/17 1816  BP:  (!) 97/54  (!) 146/92  Pulse: 85   87  Resp: 18 16 14 19   Temp:    98.5 F (36.9 C)  TempSrc:    Oral  SpO2: 95%   100%  Weight:    65.9 kg (145 lb 4.5 oz)  Height:    5\' 9"  (1.753 m)   Eyes: PERRL, lids and conjunctivae normal  ENMT: Mucous membranes are dry. Posterior pharynx clear of any exudate or lesions.Normal dentition.  Neck: normal, supple, no masses, no thyromegaly Respiratory: clear to auscultation bilaterally, no wheezing, no crackles. Normal respiratory effort. No accessory muscle use.  Cardiovascular: Regular rate and rhythm, no murmurs / rubs / gallops. No extremity edema. 2+ pedal pulses. No carotid bruits.  Abdomen: no tenderness, no masses palpated. No hepatosplenomegaly. Bowel sounds positive.  Large left inguinal hernia that is nontender on palpation.  Colostomy left upper quadrant without any stool in bag.  PEG tube in left upper quadrant without any surrounding erythema Musculoskeletal: no clubbing / cyanosis. No joint deformity upper and lower extremities. Good ROM, no contractures. Normal muscle tone.  Skin: no rashes, lesions, ulcers. No induration Neurologic: Cranial nerves are grossly intact.  Patient is severely dysarthric. Sensation intact, DTR normal. Strength 5/5 in all 4.  Psychiatric: Difficult to comprehend speech due to dysarthria.  Labs on Admission: I have personally reviewed following labs and imaging studies  CBC: Recent Labs  Lab 02/15/17 1004  WBC 7.7    NEUTROABS 5.1  HGB 12.3*  HCT 38.5*  MCV 90.0  PLT 73*   Basic Metabolic Panel: Recent Labs  Lab 02/15/17 1004  NA 138  K 3.7  CL 98*  CO2 27  GLUCOSE 108*  BUN 22*  CREATININE 1.20  CALCIUM 9.2   GFR: Estimated Creatinine Clearance: 61.8 mL/min (by C-G formula based on SCr of 1.2 mg/dL). Liver Function Tests: Recent Labs  Lab 02/15/17 1004  AST 21  ALT 10*  ALKPHOS 66  BILITOT 0.4  PROT 8.3*  ALBUMIN 3.6   No results for input(s): LIPASE, AMYLASE in the last 168 hours. No results for input(s): AMMONIA in the last 168 hours. Coagulation Profile: Recent Labs  Lab 02/15/17 1004  INR 1.05   Cardiac Enzymes: No results for input(s): CKTOTAL, CKMB, CKMBINDEX, TROPONINI in the last 168 hours. BNP (last 3 results) No results for input(s): PROBNP in the last 8760 hours. HbA1C: No results for input(s): HGBA1C in the last 72 hours. CBG: No results for input(s): GLUCAP in the last 168 hours. Lipid Profile: No results for input(s): CHOL, HDL, LDLCALC, TRIG, CHOLHDL, LDLDIRECT in the last 72 hours. Thyroid Function Tests: No results for input(s): TSH, T4TOTAL, FREET4, T3FREE, THYROIDAB in the last 72 hours. Anemia Panel: No results for input(s): VITAMINB12, FOLATE, FERRITIN, TIBC, IRON, RETICCTPCT in the last 72 hours. Urine analysis:    Component Value Date/Time   COLORURINE YELLOW 02/15/2017 1106   APPEARANCEUR CLEAR 02/15/2017 1106   LABSPEC 1.017 02/15/2017 1106   PHURINE 5.0 02/15/2017 1106   GLUCOSEU NEGATIVE 02/15/2017 1106   HGBUR NEGATIVE 02/15/2017 1106   BILIRUBINUR NEGATIVE 02/15/2017 1106   KETONESUR NEGATIVE 02/15/2017 1106   PROTEINUR NEGATIVE 02/15/2017 1106   UROBILINOGEN 1.0 11/19/2014 0152   NITRITE NEGATIVE 02/15/2017 1106   LEUKOCYTESUR NEGATIVE 02/15/2017 1106    Radiological Exams on Admission: Dg Chest 2 View  Result Date: 02/15/2017 CLINICAL DATA:  Fever and cough EXAM: CHEST  2 VIEW COMPARISON:  June 20, 2016 FINDINGS: There is  airspace consolidation in the left lower lobe consistent with pneumonia. Lungs elsewhere clear. Heart is upper normal in size with pulmonary vascularity within normal limits. There is aortic atherosclerosis. No adenopathy. No bone lesions. IMPRESSION: Left base airspace consolidation consistent with pneumonia. Lungs elsewhere clear. Heart upper normal in size. There is aortic atherosclerosis. Aortic Atherosclerosis (ICD10-I70.0). Electronically Signed   By: Lowella Grip III M.D.  On: 02/15/2017 10:41    EKG: Independently reviewed.  Sinus rhythm without any acute changes.  Assessment/Plan Active Problems:   Hypothyroidism   Thrombocytopenia (HCC)   Schizophrenia (HCC)   Seizure ([place])   HCAP (healthcare-associated pneumonia)   Left inguinal hernia   Dysarthria    1. Pneumonia.  HCAP versus aspiration pneumonia.  Started on vancomycin and Unasyn.  Check MRSA PCR.  Continue intravenous antibiotics.  Blood cultures have been sent.  Check urinary antigens.  Check sputum culture.  Continue supportive management with bronchodilators and mucolytic's.  Patient's lactic acid is normal as is his white count.  Review of records indicate that blood pressure normally runs in the 90s.  His blood pressure appears to be at baseline.  Sepsis appears to be unlikely.  With patient's dysarthria/dysphagia, aspiration pneumonia is a possibility.  Will speech therapy evaluation.  He is currently on mechanical soft diet. 2. Thrombocytopenia.  This is a chronic issue.  Followed by hematology as an outpatient. 3. Hypothyroidism.  Continue on Synthroid. 4. Seizure disorder.  No evidence of recent seizures.  Continue on Keppra. 5. Schizophrenia.  Continue on psychotropic medications. 6. Left inguinal hernia.  Patient reports this is been present for quite some time now.  It is nontender on palpation.  We will continue to monitor. 7. Dysphagia.  Patient has a PEG tube, but reports that he does eat and drink by mouth.   Review of records indicate that he is on a mechanical soft diet at the nursing facility.  Will request speech therapy to evaluate.  DVT prophylaxis: SCDs Code Status: DNR Family Communication: no family present Disposition Plan: return to SNF on discharge Consults called:  Admission status: observation, tele   Kathie Dike MD [facility] Pager [phone]  If 7PM-7AM, please contact night-coverage [email or web address] Password [facility]  02/15/2017, 6:52 PM

## 2017-02-15 NOTE — ED Provider Notes (Signed)
[facility] EMERGENCY DEPARTMENT Provider Note   CSN: [id number] Arrival date & time: 02/15/17  8527     History   Chief Complaint Chief Complaint  Patient presents with  . Weakness    HPI Steven David is a 62 y.o. male.  HPI Level 5 caveat due to mental status change.  Patient coming from nursing home.  Per nursing home staff he has been running a fever and has had generalized weakness as well as cough. Past Medical History:  Diagnosis Date  . Aggressive outburst   . Cognitive communication deficit   . Colostomy in place [facility])   . Drug-induced Parkinsonism ([place])   . Dysphagia   . Dysphagia   . Hard of hearing    Wears bilateral hearing aides  . Hernia   . High cholesterol   . HOH (hard of hearing)   . Malnutrition ([place])   . Malnutrition ([place])   . Schizoaffective disorder ([place])   . Schizophrenia ([place])   . Seizures ([place])   . Thyroid disease   . TIA (transient ischemic attack)     Patient Active Problem List   Diagnosis Date Noted  . Malfunction of percutaneous endoscopic gastrostomy (PEG) tube ([place])   . Malnutrition of moderate degree 11/20/2014  . HCAP (healthcare-associated pneumonia) 11/19/2014  . Postoperative anemia due to acute blood loss 09/07/2014  . Seizure ([place])   . Incarcerated inguinal hernia 09/01/2014  . Respiratory failure, acute ([place]) 09/01/2014  . Metabolic acidosis 78/24/2353  . Incarcerated hernia 09/01/2014  . Septic shock ([place]) 09/01/2014  . AKI (acute kidney injury) ([place])   . Protein-calorie malnutrition, severe ([place]) 04/13/2013  . Sepsis ([place]) 04/11/2013  . H/O stroke within last year 04/11/2013  . Hypothyroidism 04/11/2013  . Pneumonia 04/11/2013  . Thrombocytopenia ([place]) 04/11/2013  . Schizophrenia ([place]) 04/11/2013  . Malnutrition ([place]) 04/11/2013  . SIRS (systemic inflammatory response syndrome) ([place]) 04/11/2013    Past Surgical History:  Procedure Laterality Date  . ESOPHAGOGASTRODUODENOSCOPY (EGD) WITH PROPOFOL N/A  09/08/2014   Procedure: ESOPHAGOGASTRODUODENOSCOPY (EGD) WITH PROPOFOL;  Surgeon: Georganna Skeans, MD;  Location: [place];  Service: Endoscopy;  Laterality: N/A;  . LAPAROTOMY N/A 09/01/2014   Procedure: EXPLORATORY LAPAROTOMY, SIGMOID COLECTOMY, SIGMOID COLOSTOMY, PRIMARY REPAIR OF STRANGULATED INGUINAL HERNIA.;  Surgeon: Rolm Bookbinder, MD;  Location: [place];  Service: General;  Laterality: N/A;  . LEG SURGERY    . PEG PLACEMENT N/A 09/08/2014   Procedure: PERCUTANEOUS ENDOSCOPIC GASTROSTOMY (PEG) PLACEMENT;  Surgeon: Georganna Skeans, MD;  Location: [place];  Service: Endoscopy;  Laterality: N/A;  . PEG PLACEMENT N/A 06/30/2015   Procedure: PERCUTANEOUS ENDOSCOPIC GASTROSTOMY (PEG) REPLACEMENT;  Surgeon: Daneil Dolin, MD;  [facility];  Service: Endoscopy;  Laterality: N/A;  1100  . PEG PLACEMENT N/A 12/30/2015   Procedure: PERCUTANEOUS ENDOSCOPIC GASTROSTOMY (PEG) REPLACEMENT;  Surgeon: Daneil Dolin, MD;  [facility];  Service: Endoscopy;  Laterality: N/A;  . PEG PLACEMENT N/A 08/24/2016   Procedure: PERCUTANEOUS ENDOSCOPIC GASTROSTOMY (PEG) REPLACEMENT;  Surgeon: Daneil Dolin, MD;  [facility];  Service: Endoscopy;  Laterality: N/A;  . TESTICLE SURGERY         Home Medications    Prior to Admission medications   Medication Sig Start Date End Date Taking? Authorizing Provider  acetaminophen (TYLENOL) 325 MG tablet Place 650 mg into feeding tube every 6 (six) hours as needed for moderate pain or fever.   Yes [provider]  aspirin 325 MG tablet Take  325 mg by mouth daily.   Yes [provider]  cholecalciferol (VITAMIN D) 1000 units tablet Take 1,000 Units by mouth daily.   Yes [provider]  divalproex (DEPAKOTE SPRINKLE) 125 MG capsule Take 625 mg by mouth 2 (two) times daily.   Yes [provider]  ferrous sulfate 325 (65 FE) MG tablet 325 mg daily with breakfast. Per tube   Yes [provider]  furosemide (LASIX) 20 MG tablet Place 10 mg into feeding tube 2 (two) times daily.    Yes [provider]  guaifenesin (HUMIBID E) 400 MG TABS tablet Take 400 mg by mouth 2 (two) times daily. Per tube    Yes [provider]  haloperidol (HALDOL) 10 MG tablet Take one (1) tablet by mouth at bedtime 11/14/16 03/13/17 Yes [provider]  haloperidol decanoate (HALDOL DECANOATE) 50 MG/ML injection Inject 100 mg into the muscle every 28 (twenty-eight) days.   Yes [provider]  levETIRAcetam (KEPPRA) 250 MG tablet Take 250 mg by mouth 2 (two) times daily. Per tube   Yes [provider]  levothyroxine (SYNTHROID, LEVOTHROID) 100 MCG tablet Take 100 mcg by mouth daily before breakfast.   Yes [provider]  loratadine (CLARITIN) 10 MG tablet Place 1 tablet (10 mg total) into feeding tube daily. 09/10/14  Yes Randon Goldsmith, Megan N, PA-C  lovastatin (MEVACOR) 20 MG tablet Place 1 tablet (20 mg total) into feeding tube daily. 09/10/14  Yes Randon Goldsmith, Megan N, PA-C  Nutritional Supplements (FEEDING SUPPLEMENT, JEVITY 1.5 CAL/FIBER,) LIQD Place 1,000 mLs into feeding tube continuous. Initiate nocturnal feedings of Jevity 1.5 @ 100 ml/hr via PEG over 14 hour period 09/10/14  Yes Randon Goldsmith, Megan N, PA-C  OLANZapine zydis (ZYPREXA) 5 MG disintegrating tablet Take 5 mg by mouth at bedtime.   Yes [provider]  oxybutynin (DITROPAN) 5 MG tablet Take 5 mg by mouth 2 (two) times daily.   Yes [provider]  polyethylene glycol (MIRALAX / GLYCOLAX) packet Take 17 g by mouth daily.   Yes [provider]  vitamin C (ASCORBIC ACID) 500 MG tablet Take 500 mg by mouth 2 (two) times daily.    Yes [provider]    Family History Family History  Family history unknown: Yes    Social History Social History   Tobacco Use  . Smoking status: Current Every Day Smoker  . Smokeless tobacco: Never Used  Substance Use Topics  .  Alcohol use: No  . Drug use: No     Allergies   Patient has no known allergies.   Review of Systems Review of Systems  Unable to perform ROS: Psychiatric disorder     Physical Exam Updated Vital Signs BP (!) 92/49   Pulse 77   Temp 98.4 F (36.9 C) (Oral)   Resp 15   Wt 64.4 kg (142 lb)   SpO2 (!) 86%   BMI 20.37 kg/m   Physical Exam  Constitutional: He appears well-developed and well-nourished. No distress.  HENT:  Head: Normocephalic and atraumatic.  Dry mucous membrane.    Eyes: EOM are normal. Pupils are equal, round, and reactive to light.  Disconjugate eye movement  Neck: Normal range of motion. Neck supple.  No meningismus  Cardiovascular: Normal rate and regular rhythm. Exam reveals no gallop and no friction rub.  No murmur heard. Pulmonary/Chest: Effort normal. He has rales.  Diminished breath sounds and rhonchi in the base.  Abdominal: Soft. Bowel sounds are normal. There  is no tenderness. There is no rebound and no guarding.  Colostomy bag in the left upper quadrant.  Patient is diffusely tender to palpation without rebound or guarding.  Patient has a large left-sided inguinal hernia which is soft.  Difficult to reduce due to the size of the hernia.  Musculoskeletal: Normal range of motion. He exhibits no edema or tenderness.  No lower extremity swelling, asymmetry or tenderness.  Neurological: He is alert.  Follows simple commands.  Moves all extremities without focal weakness.  Sensation grossly intact.  Skin: Skin is warm and dry. No rash noted. He is not diaphoretic. No erythema.  Psychiatric: He has a normal mood and affect. His behavior is normal.  Nursing note and vitals reviewed.    ED Treatments / Results  Labs (all labs ordered are listed, but only abnormal results are displayed) Labs Reviewed  COMPREHENSIVE METABOLIC PANEL - Abnormal; Notable for the following components:      Result Value   Chloride 98 (*)    Glucose, Bld 108 (*)     BUN 22 (*)    Total Protein 8.3 (*)    ALT 10 (*)    All other components within normal limits  CBC WITH DIFFERENTIAL/PLATELET - Abnormal; Notable for the following components:   Hemoglobin 12.3 (*)    HCT 38.5 (*)    RDW 15.6 (*)    Platelets 73 (*)    Monocytes Absolute 1.6 (*)    All other components within normal limits  CULTURE, BLOOD (ROUTINE X 2)  CULTURE, BLOOD (ROUTINE X 2)  PROTIME-INR  URINALYSIS, ROUTINE W REFLEX MICROSCOPIC  LACTIC ACID, PLASMA    EKG  EKG Interpretation None       Radiology Dg Chest 2 View  Result Date: 02/15/2017 CLINICAL DATA:  Fever and cough EXAM: CHEST  2 VIEW COMPARISON:  June 20, 2016 FINDINGS: There is airspace consolidation in the left lower lobe consistent with pneumonia. Lungs elsewhere clear. Heart is upper normal in size with pulmonary vascularity within normal limits. There is aortic atherosclerosis. No adenopathy. No bone lesions. IMPRESSION: Left base airspace consolidation consistent with pneumonia. Lungs elsewhere clear. Heart upper normal in size. There is aortic atherosclerosis. Aortic Atherosclerosis (ICD10-I70.0). Electronically Signed   By: Lowella Grip III M.D.   On: 02/15/2017 10:41    Procedures Procedures (including critical care time)  Medications Ordered in ED Medications  vancomycin (VANCOCIN) 500 mg in sodium chloride 0.9 % 100 mL IVPB (not administered)  ceFEPIme (MAXIPIME) 1 g in dextrose 5 % 50 mL IVPB (not administered)  sodium chloride 0.9 % bolus 1,000 mL (0 mLs Intravenous Stopped 02/15/17 1115)    And  sodium chloride 0.9 % bolus 1,000 mL (0 mLs Intravenous Stopped 02/15/17 1322)  ceFEPIme (MAXIPIME) 2 g in dextrose 5 % 50 mL IVPB (0 g Intravenous Stopped 02/15/17 1155)  vancomycin (VANCOCIN) IVPB 1000 mg/200 mL premix (0 mg Intravenous Stopped 02/15/17 1322)     Initial Impression / Assessment and Plan / ED Course  I have reviewed the triage vital signs and the nursing notes.  Pertinent labs & imaging  results that were available during my care of the patient were reviewed by me and considered in my medical decision making (see chart for details).     Initiated sepsis protocol.  Blood pressure improved after IV fluids.  Started broad-spectrum antibiotics for coverage of pneumonia.  Will discuss with hospitalist regarding admission.  Final Clinical Impressions(s) / ED Diagnoses   Final diagnoses:  HCAP (healthcare-associated pneumonia)    ED Discharge Orders    None       Julianne Rice, MD 02/15/17 1527

## 2017-02-15 NOTE — ED Triage Notes (Signed)
Here by [employer].  Per staff pt has had a fever.  Generalized weakness.

## 2017-02-16 DIAGNOSIS — J189 Pneumonia, unspecified organism: Secondary | ICD-10-CM | POA: Diagnosis not present

## 2017-02-16 DIAGNOSIS — J690 Pneumonitis due to inhalation of food and vomit: Secondary | ICD-10-CM

## 2017-02-16 DIAGNOSIS — D696 Thrombocytopenia, unspecified: Secondary | ICD-10-CM | POA: Diagnosis not present

## 2017-02-16 DIAGNOSIS — E038 Other specified hypothyroidism: Secondary | ICD-10-CM | POA: Diagnosis not present

## 2017-02-16 DIAGNOSIS — K4090 Unilateral inguinal hernia, without obstruction or gangrene, not specified as recurrent: Secondary | ICD-10-CM

## 2017-02-16 DIAGNOSIS — R569 Unspecified convulsions: Secondary | ICD-10-CM | POA: Diagnosis not present

## 2017-02-16 DIAGNOSIS — R471 Dysarthria and anarthria: Secondary | ICD-10-CM | POA: Diagnosis not present

## 2017-02-16 DIAGNOSIS — F203 Undifferentiated schizophrenia: Secondary | ICD-10-CM | POA: Diagnosis not present

## 2017-02-16 LAB — BASIC METABOLIC PANEL
Anion gap: 8 (ref 5–15)
BUN: 17 mg/dL (ref 6–20)
CALCIUM: 7.9 mg/dL — AB (ref 8.9–10.3)
CO2: 25 mmol/L (ref 22–32)
Chloride: 106 mmol/L (ref 101–111)
Creatinine, Ser: 0.82 mg/dL (ref 0.61–1.24)
Glucose, Bld: 119 mg/dL — ABNORMAL HIGH (ref 65–99)
Potassium: 3.5 mmol/L (ref 3.5–5.1)
Sodium: 139 mmol/L (ref 135–145)

## 2017-02-16 LAB — CBC
HEMATOCRIT: 34.3 % — AB (ref 39.0–52.0)
Hemoglobin: 10.7 g/dL — ABNORMAL LOW (ref 13.0–17.0)
MCH: 28.5 pg (ref 26.0–34.0)
MCHC: 31.2 g/dL (ref 30.0–36.0)
MCV: 91.2 fL (ref 78.0–100.0)
Platelets: 60 10*3/uL — ABNORMAL LOW (ref 150–400)
RBC: 3.76 MIL/uL — ABNORMAL LOW (ref 4.22–5.81)
RDW: 15.9 % — AB (ref 11.5–15.5)
WBC: 6.3 10*3/uL (ref 4.0–10.5)

## 2017-02-16 MED ORDER — AMOXICILLIN-POT CLAVULANATE 250-62.5 MG/5ML PO SUSR: 500.0000 mg | Freq: Three times a day (TID) | ORAL | 0 refills | Status: AC

## 2017-02-16 MED ORDER — AMOXICILLIN-POT CLAVULANATE 875-125 MG PO TABS: 1.0000 | ORAL_TABLET | Freq: Two times a day (BID) | ORAL | 0 refills | Status: DC

## 2017-02-16 NOTE — Discharge Summary (Signed)
Physician Discharge Summary  Steven David VOJ:[id number] DOB: 08/03/1955 DOA: 02/15/2017  PCP: Hilbert Corrigan, MD  Admit date: 02/15/2017 Discharge date: 02/16/2017  Admitted From: Skilled nursing facility Disposition: Skilled nursing facility  Recommendations for Outpatient Follow-up:  1. Follow up with PCP in 1-2 weeks 2. Please obtain BMP/CBC in one week 3. Repeat chest x-ray in 3-4 weeks to ensure resolution of pneumonia   Discharge Condition: Stable CODE STATUS: DNR Diet recommendation: Mechanical soft diet with thin liquids as tolerated  Brief/Interim Summary: 62 year old male with a history of strangulate hernia status post sigmoid colectomy/colostomy, schizophrenia, seizures, hypothyroidism, severe dysarthria and dysphagia status post PEG tube who is a resident of a nursing facility.  Patient was brought to the hospital with complaints of fever.  He is a very difficult historian due to his severe dysarthria.  He also had generalized weakness and cough.  Workup in the emergency room indicated evidence of pneumonia on chest x-ray.  Lactic acid was normal.  Blood pressure was noted to be in the 80s.  His normal blood pressure runs in the 90s.  Patient was started on intravenous antibiotics.  Due to severe dysphagia, it is thought that pneumonia is likely aspiration.  Case was discussed with speech therapy who knows the patient very well.  He has severe dysphagia and was previously recommended to be n.p.o. status with all feeding/medications to be going through PEG tube.  Patient continued to eat and drink.  He accepted the risks of aspiration and subsequent pneumonia.  This was readdressed with him during this admission and he wanted to continue eating despite risks of aspiration.  He has been transitioned to Augmentin.  He is not having any high fevers or leukocytosis.  He has chronic thrombocytopenia which is being followed as an outpatient.  He was evaluated by general surgery for  his very significant left inguinal hernia.  It was noted to be nontender without evidence of incarceration, strangulation or obstruction.  It was not felt that he required surgical repair at this time.  The remainder of his blood work is unrevealing.  He otherwise appears stable for discharge.  Discharge Diagnoses:  Active Problems:   Hypothyroidism   Thrombocytopenia ([place])   Schizophrenia ([place])   Seizure ([place])   HCAP (healthcare-associated pneumonia)   Left inguinal hernia   Dysarthria    Discharge Instructions  Discharge Instructions    Diet - low sodium heart healthy   Complete by:  As directed    Increase activity slowly   Complete by:  As directed      Allergies as of 02/16/2017   No Known Allergies     Medication List    TAKE these medications   acetaminophen 325 MG tablet Commonly known as:  TYLENOL Place 650 mg into feeding tube every 6 (six) hours as needed for moderate pain or fever.   amoxicillin-clavulanate 250-62.5 MG/5ML suspension Commonly known as:  AUGMENTIN Place 10 mLs (500 mg total) into feeding tube 3 (three) times daily for 5 days.   aspirin 325 MG tablet Take 325 mg by mouth daily.   cholecalciferol 1000 units tablet Commonly known as:  VITAMIN D Take 1,000 Units by mouth daily.   divalproex 125 MG capsule Commonly known as:  DEPAKOTE SPRINKLE Take 625 mg by mouth 2 (two) times daily.   feeding supplement (JEVITY 1.5 CAL/FIBER) Liqd Place 1,000 mLs into feeding tube continuous. Initiate nocturnal feedings of Jevity 1.5 @ 100 ml/hr via PEG over 14 hour period  ferrous sulfate 325 (65 FE) MG tablet 325 mg daily with breakfast. Per tube   furosemide 20 MG tablet Commonly known as:  LASIX Place 10 mg into feeding tube 2 (two) times daily.   guaifenesin 400 MG Tabs tablet Commonly known as:  HUMIBID E Take 400 mg by mouth 2 (two) times daily. Per tube   haloperidol 10 MG tablet Commonly known as:  HALDOL Take one (1) tablet by mouth at  bedtime   haloperidol decanoate 50 MG/ML injection Commonly known as:  HALDOL DECANOATE Inject 100 mg into the muscle every 28 (twenty-eight) days.   levETIRAcetam 250 MG tablet Commonly known as:  KEPPRA Take 250 mg by mouth 2 (two) times daily. Per tube   levothyroxine 100 MCG tablet Commonly known as:  SYNTHROID, LEVOTHROID Take 100 mcg by mouth daily before breakfast.   loratadine 10 MG tablet Commonly known as:  CLARITIN Place 1 tablet (10 mg total) into feeding tube daily.   lovastatin 20 MG tablet Commonly known as:  MEVACOR Place 1 tablet (20 mg total) into feeding tube daily.   OLANZapine zydis 5 MG disintegrating tablet Commonly known as:  ZYPREXA Take 5 mg by mouth at bedtime.   oxybutynin 5 MG tablet Commonly known as:  DITROPAN Take 5 mg by mouth 2 (two) times daily.   polyethylene glycol packet Commonly known as:  MIRALAX / GLYCOLAX Take 17 g by mouth daily.   vitamin C 500 MG tablet Commonly known as:  ASCORBIC ACID Take 500 mg by mouth 2 (two) times daily.      Contact information for after-discharge care    Destination    [facility] .   Service:  Skilled Nursing Contact information: [address] [place] [phone]             No Known Allergies  Consultations:  General surgery consultation   Procedures/Studies: Dg Chest 2 View  Result Date: 02/15/2017 CLINICAL DATA:  Fever and cough EXAM: CHEST  2 VIEW COMPARISON:  June 20, 2016 FINDINGS: There is airspace consolidation in the left lower lobe consistent with pneumonia. Lungs elsewhere clear. Heart is upper normal in size with pulmonary vascularity within normal limits. There is aortic atherosclerosis. No adenopathy. No bone lesions. IMPRESSION: Left base airspace consolidation consistent with pneumonia. Lungs elsewhere clear. Heart upper normal in size. There is aortic atherosclerosis. Aortic Atherosclerosis (ICD10-I70.0). Electronically  Signed   By: Lowella Grip III M.D.   On: 02/15/2017 10:41       Subjective: No significant cough, shortness of breath or abdominal pain.  No vomiting.  Discharge Exam: Vitals:   02/16/17 0824 02/16/17 1448  BP:    Pulse:    Resp:    Temp:    SpO2: 91% 93%   Vitals:   02/15/17 2149 02/16/17 0505 02/16/17 0824 02/16/17 1448  BP: 110/64 (!) 101/54    Pulse: 78 72    Resp:      Temp: 100.2 F (37.9 C) 98.7 F (37.1 C)    TempSrc: Oral Oral    SpO2: 92% 93% 91% 93%  Weight:      Height:        General: Pt is alert, awake, not in acute distress Cardiovascular: RRR, S1/S2 +, no rubs, no gallops Respiratory: CTA bilaterally, no wheezing, no rhonchi Abdominal: Soft, NT, ND, bowel sounds +, colostomy present in left upper quadrant with stool in colostomy bag, PEG tube present without any surrounding erythema, large left inguinal  hernia, nontender Extremities: no edema, no cyanosis    The results of significant diagnostics from this hospitalization (including imaging, microbiology, ancillary and laboratory) are listed below for reference.     Microbiology: Recent Results (from the past 240 hour(s))  Culture, blood (Routine x 2)     Status: None (Preliminary result)   Collection Time: 02/15/17 10:04 AM  Result Value Ref Range Status   Specimen Description BLOOD RIGHT ARM DRAWN BY RN  Final   Special Requests   Final    BOTTLES DRAWN AEROBIC AND ANAEROBIC Blood Culture adequate volume   Culture   Final    NO GROWTH < 24 HOURS Performed at [facility], [address], [place], [place] [address]    Report Status PENDING  Incomplete  Culture, blood (Routine x 2)     Status: None (Preliminary result)   Collection Time: 02/15/17 10:35 AM  Result Value Ref Range Status   Specimen Description BLOOD LEFT ANTECUBITAL  Final   Special Requests   Final    BOTTLES DRAWN AEROBIC AND ANAEROBIC Blood Culture adequate volume   Culture   Final    NO GROWTH < 24 HOURS Performed  at [facility], [address], [place], [place] [address]    Report Status PENDING  Incomplete  MRSA PCR Screening     Status: None   Collection Time: 02/15/17  6:52 PM  Result Value Ref Range Status   MRSA by PCR NEGATIVE NEGATIVE Final    Comment:        The GeneXpert MRSA Assay (FDA approved for NASAL specimens only), is one component of a comprehensive MRSA colonization surveillance program. It is not intended to diagnose MRSA infection nor to guide or monitor treatment for MRSA infections. Performed at [facility], [address], [place],  [address]      Labs: BNP (last 3 results) No results for input(s): BNP in the last 8760 hours. Basic Metabolic Panel: Recent Labs  Lab 02/15/17 1004 02/16/17 0759  NA 138 139  K 3.7 3.5  CL 98* 106  CO2 27 25  GLUCOSE 108* 119*  BUN 22* 17  CREATININE 1.20 0.82  CALCIUM 9.2 7.9*   Liver Function Tests: Recent Labs  Lab 02/15/17 1004  AST 21  ALT 10*  ALKPHOS 66  BILITOT 0.4  PROT 8.3*  ALBUMIN 3.6   No results for input(s): LIPASE, AMYLASE in the last 168 hours. No results for input(s): AMMONIA in the last 168 hours. CBC: Recent Labs  Lab 02/15/17 1004 02/16/17 0759  WBC 7.7 6.3  NEUTROABS 5.1  --   HGB 12.3* 10.7*  HCT 38.5* 34.3*  MCV 90.0 91.2  PLT 73* 60*   Cardiac Enzymes: No results for input(s): CKTOTAL, CKMB, CKMBINDEX, TROPONINI in the last 168 hours. BNP: Invalid input(s): POCBNP CBG: No results for input(s): GLUCAP in the last 168 hours. D-Dimer No results for input(s): DDIMER in the last 72 hours. Hgb A1c No results for input(s): HGBA1C in the last 72 hours. Lipid Profile No results for input(s): CHOL, HDL, LDLCALC, TRIG, CHOLHDL, LDLDIRECT in the last 72 hours. Thyroid function studies No results for input(s): TSH, T4TOTAL, T3FREE, THYROIDAB in the last 72 hours.  Invalid input(s): FREET3 Anemia work up No results for input(s): VITAMINB12, FOLATE, FERRITIN, TIBC, IRON,  RETICCTPCT in the last 72 hours. Urinalysis    Component Value Date/Time   COLORURINE YELLOW 02/15/2017 1106   APPEARANCEUR CLEAR 02/15/2017 1106   LABSPEC 1.017 02/15/2017 1106  PHURINE 5.0 02/15/2017 1106   GLUCOSEU NEGATIVE 02/15/2017 1106   HGBUR NEGATIVE 02/15/2017 1106   [place] 02/15/2017 1106   [place] 02/15/2017 1106   PROTEINUR NEGATIVE 02/15/2017 1106   UROBILINOGEN 1.0 11/19/2014 0152   NITRITE NEGATIVE 02/15/2017 1106   LEUKOCYTESUR NEGATIVE 02/15/2017 1106   Sepsis Labs Invalid input(s): PROCALCITONIN,  WBC,  LACTICIDVEN Microbiology Recent Results (from the past 240 hour(s))  Culture, blood (Routine x 2)     Status: None (Preliminary result)   Collection Time: 02/15/17 10:04 AM  Result Value Ref Range Status   Specimen Description BLOOD RIGHT ARM DRAWN BY RN  Final   Special Requests   Final    BOTTLES DRAWN AEROBIC AND ANAEROBIC Blood Culture adequate volume   Culture   Final    NO GROWTH < 24 HOURS Performed at [facility], [address], [place], [place] [address]    Report Status PENDING  Incomplete  Culture, blood (Routine x 2)     Status: None (Preliminary result)   Collection Time: 02/15/17 10:35 AM  Result Value Ref Range Status   Specimen Description BLOOD LEFT ANTECUBITAL  Final   Special Requests   Final    BOTTLES DRAWN AEROBIC AND ANAEROBIC Blood Culture adequate volume   Culture   Final    NO GROWTH < 24 HOURS Performed at [facility], [address] [place], [place] [address]    Report Status PENDING  Incomplete  MRSA PCR Screening     Status: None   Collection Time: 02/15/17  6:52 PM  Result Value Ref Range Status   MRSA by PCR NEGATIVE NEGATIVE Final    Comment:        The GeneXpert MRSA Assay (FDA approved for NASAL specimens only), is one component of a comprehensive MRSA colonization surveillance program. It is not intended to diagnose MRSA infection nor to guide or monitor treatment for MRSA  infections. Performed at [facility], [address], [place], [place] [address]      Time coordinating discharge: Over 30 minutes  SIGNED:   Kathie Dike, MD  [facility] 02/16/2017, 3:54 PM Pager   If 7PM-7AM, please contact night-coverage [email or web address] Password [email or web address]

## 2017-02-16 NOTE — Discharge Instructions (Signed)
1. Follow up with PCP in 1-2 weeks 2. Please obtain BMP/CBC in one week 3. Repeat chest x-ray in 3-4 weeks to ensure resolution of pneumonia

## 2017-02-16 NOTE — Progress Notes (Signed)
Pt with longstanding history of chronic dysphagia and is a known aspirator despite diet modifications. Pt has had multiple previous objective swallow studies (3 MBSS with most recent August 30, 2016 and NPO recommendations). Despite aspiration risks, pt has elected to eat by mouth with known risks. MD aware and will continue to treat PNA and discuss continued risks with pt. SLP will sign off.     Soren Lazarz MA, [facility] Speech Language Pathologist

## 2017-02-16 NOTE — Progress Notes (Signed)
Report called into Rafael Capi, LPN at [facility] [place], [place].

## 2017-02-16 NOTE — Progress Notes (Signed)
Nutrition Brief Note  Patient reported on Malnutrition Screening Tool that he was unsure whether he had lost weight or not  Wt Readings from Last 15 Encounters:  02/15/17 145 lb 4.5 oz (65.9 kg)  10/20/16 142 lb 14.4 oz (64.8 kg)  09/18/16 134 lb (60.8 kg)  08/23/16 139 lb (63 kg)  06/20/16 139 lb (63 kg)  06/01/16 139 lb (63 kg)  04/25/16 130 lb (59 kg)  12/29/15 130 lb (59 kg)  08/18/15 130 lb (59 kg)  01/25/15 107 lb 6.4 oz (48.7 kg)  11/19/14 120 lb 13 oz (54.8 kg)  09/25/14 113 lb (51.3 kg)  09/15/14 125 lb (56.7 kg)  09/10/14 123 lb 10.9 oz (56.1 kg)  04/15/13 129 lb 3 oz (58.6 kg)   Bedweight today is unchanged from yesterday, roughly 145 lbs. Per chart, most of listed weights appear to be reported/estimated, but would suggest gradual wt gain over the past years.    Patient states that at his SNF, he eats by mouth. He does not receive tube feeding and only uses his PEG for medications.   He has a good appetite and asks if he can tell the RD what he wants to eat for Dinner. RD took his dinner and breakfast requests and forwarded these to dietary ambassador.   He does appear to have some mild muscle wasting of interosseous and clavicular musculature, however this is likely improved from what it was given his weight gain.   Patient has good appetite, has gained weight and is not tube fed. No nutrition interventions warranted at this time. If nutrition issues arise, please consult RD.   Burtis Junes RD, LDN, CNSC Clinical Nutrition Pager: [phone] 02/16/2017 2:56 PM

## 2017-02-16 NOTE — Care Management Obs Status (Signed)
[facility] NOTIFICATION   Patient Details  Name: Steven David MRN: [id number] Date of Birth: 03-May-1955   Medicare Observation Status Notification Given:  Yes    Sherald Barge, RN 02/16/2017, 4:21 PM

## 2017-02-16 NOTE — Consult Note (Signed)
Reason for Consult: Left inguinal hernia Referring Physician: Dr. Valentina Shaggy is an 62 y.o. male.  HPI: Patient is a 62 year old white male with multiple medical problems including dysphasia who is status post emergent exploratory laparotomy, sigmoid colectomy with colostomy, and left inguinal hernia repair in 2016 who presented to the emergency room for workup of fever.  It is difficult to get a review of systems from the patient due to his dysarthria.  He was admitted to the hospital with a diagnosis of probable pneumonia.  I was asked to see the patient due to a large left inguinal hernia that is present.  Past Medical History:  Diagnosis Date  . Aggressive outburst   . Cognitive communication deficit   . Colostomy in place [facility])   . Drug-induced Parkinsonism ([place])   . Dysphagia   . Dysphagia   . Hard of hearing    Wears bilateral hearing aides  . Hernia   . High cholesterol   . HOH (hard of hearing)   . Malnutrition ([place])   . Malnutrition ([place])   . Schizoaffective disorder ([place])   . Schizophrenia ([place])   . Seizures ([place])   . Thyroid disease   . TIA (transient ischemic attack)     Past Surgical History:  Procedure Laterality Date  . ESOPHAGOGASTRODUODENOSCOPY (EGD) WITH PROPOFOL N/A 09/08/2014   Procedure: ESOPHAGOGASTRODUODENOSCOPY (EGD) WITH PROPOFOL;  Surgeon: Georganna Skeans, MD;  Location: [facility];  Service: Endoscopy;  Laterality: N/A;  . LAPAROTOMY N/A 09/01/2014   Procedure: EXPLORATORY LAPAROTOMY, SIGMOID COLECTOMY, SIGMOID COLOSTOMY, PRIMARY REPAIR OF STRANGULATED INGUINAL HERNIA.;  Surgeon: Rolm Bookbinder, MD;  Location: [facility];  Service: General;  Laterality: N/A;  . LEG SURGERY    . PEG PLACEMENT N/A 09/08/2014   Procedure: PERCUTANEOUS ENDOSCOPIC GASTROSTOMY (PEG) PLACEMENT;  Surgeon: Georganna Skeans, MD;  Location: [facility];  Service: Endoscopy;  Laterality: N/A;  . PEG PLACEMENT N/A 06/30/2015   Procedure: PERCUTANEOUS ENDOSCOPIC GASTROSTOMY  (PEG) REPLACEMENT;  Surgeon: Daneil Dolin, MD;  [facility];  Service: Endoscopy;  Laterality: N/A;  1100  . PEG PLACEMENT N/A 12/30/2015   Procedure: PERCUTANEOUS ENDOSCOPIC GASTROSTOMY (PEG) REPLACEMENT;  Surgeon: Daneil Dolin, MD;  [facility];  Service: Endoscopy;  Laterality: N/A;  . PEG PLACEMENT N/A 08/24/2016   Procedure: PERCUTANEOUS ENDOSCOPIC GASTROSTOMY (PEG) REPLACEMENT;  Surgeon: Daneil Dolin, MD;  [facility];  Service: Endoscopy;  Laterality: N/A;  . TESTICLE SURGERY      Family History  Family history unknown: Yes    Social History:  reports that he has been smoking.  he has never used smokeless tobacco. He reports that he does not drink alcohol or use drugs.  Allergies: No Known Allergies  Medications: I have reviewed the patient's current medications.  Results for orders placed or performed during the hospital encounter of 02/15/17 (from the past 48 hour(s))  Comprehensive metabolic panel     Status: Abnormal   Collection Time: 02/15/17 10:04 AM  Result Value Ref Range   Sodium 138 135 - 145 mmol/L   Potassium 3.7 3.5 - 5.1 mmol/L   Chloride 98 (L) 101 - 111 mmol/L   CO2 27 22 - 32 mmol/L   Glucose, Bld 108 (H) 65 - 99 mg/dL   BUN 22 (H) 6 - 20 mg/dL   Creatinine, Ser 1.20 0.61 - 1.24 mg/dL   Calcium 9.2 8.9 - 10.3 mg/dL   Total Protein 8.3 (H) 6.5 - 8.1 g/dL  Albumin 3.6 3.5 - 5.0 g/dL   AST 21 15 - 41 U/L   ALT 10 (L) 17 - 63 U/L   Alkaline Phosphatase 66 38 - 126 U/L   Total Bilirubin 0.4 0.3 - 1.2 mg/dL   GFR calc non Af Amer >60 >60 mL/min   GFR calc Af Amer >60 >60 mL/min    Comment: (NOTE) The eGFR has been calculated using the CKD EPI equation. This calculation has not been validated in all clinical situations. eGFR's persistently <60 mL/min signify possible Chronic Kidney Disease.    Anion gap 13 5 - 15    Comment: Performed at [facility], [address] [place], [place] [address]  CBC with  Differential     Status: Abnormal   Collection Time: 02/15/17 10:04 AM  Result Value Ref Range   WBC 7.7 4.0 - 10.5 K/uL   RBC 4.28 4.22 - 5.81 MIL/uL   Hemoglobin 12.3 (L) 13.0 - 17.0 g/dL   HCT 38.5 (L) 39.0 - 52.0 %   MCV 90.0 78.0 - 100.0 fL   MCH 28.7 26.0 - 34.0 pg   MCHC 31.9 30.0 - 36.0 g/dL   RDW 15.6 (H) 11.5 - 15.5 %   Platelets 73 (L) 150 - 400 K/uL    Comment: PLATELET COUNT CONFIRMED BY SMEAR SPECIMEN CHECKED FOR CLOTS    Neutrophils Relative % 66 %   Neutro Abs 5.1 1.7 - 7.7 K/uL   Lymphocytes Relative 10 %   Lymphs Abs 0.8 0.7 - 4.0 K/uL   Monocytes Relative 21 %   Monocytes Absolute 1.6 (H) 0.1 - 1.0 K/uL   Eosinophils Relative 2 %   Eosinophils Absolute 0.2 0.0 - 0.7 K/uL   Basophils Relative 0 %   Basophils Absolute 0.0 0.0 - 0.1 K/uL    Comment: Performed at [facility], [address], [place], [place] [address]  Protime-INR     Status: None   Collection Time: 02/15/17 10:04 AM  Result Value Ref Range   Prothrombin Time 13.7 11.4 - 15.2 seconds   INR 1.05     Comment: Performed at [facility], [address], [place], [place] [address]  Culture, blood (Routine x 2)     Status: None (Preliminary result)   Collection Time: 02/15/17 10:04 AM  Result Value Ref Range   Specimen Description BLOOD RIGHT ARM DRAWN BY RN    Special Requests      BOTTLES DRAWN AEROBIC AND ANAEROBIC Blood Culture adequate volume   Culture      NO GROWTH < 24 HOURS Performed at [facility], [address], [place], [place] [address]    Report Status PENDING   Lactic acid, plasma     Status: None   Collection Time: 02/15/17 10:04 AM  Result Value Ref Range   Lactic Acid, Venous 1.2 0.5 - 1.9 mmol/L    Comment: Performed at [facility], [address], [place], [place] [address]  Culture, blood (Routine x 2)     Status: None (Preliminary result)   Collection Time: 02/15/17 10:35 AM  Result Value Ref Range   Specimen Description BLOOD LEFT ANTECUBITAL    Special Requests       BOTTLES DRAWN AEROBIC AND ANAEROBIC Blood Culture adequate volume   Culture      NO GROWTH < 24 HOURS Performed at [facility], [address], [place], [place] [address]    Report Status PENDING   Urinalysis, Routine w reflex microscopic     Status:  None   Collection Time: 02/15/17 11:06 AM  Result Value Ref Range   Color, Urine YELLOW YELLOW   APPearance CLEAR CLEAR   Specific Gravity, Urine 1.017 1.005 - 1.030   pH 5.0 5.0 - 8.0   Glucose, UA NEGATIVE NEGATIVE mg/dL   Hgb urine dipstick NEGATIVE NEGATIVE   Bilirubin Urine NEGATIVE NEGATIVE   Ketones, ur NEGATIVE NEGATIVE mg/dL   Protein, ur NEGATIVE NEGATIVE mg/dL   Nitrite NEGATIVE NEGATIVE   Leukocytes, UA NEGATIVE NEGATIVE    Comment: Performed at [facility], [address], [place], [place] [address]  MRSA PCR Screening     Status: None   Collection Time: 02/15/17  6:52 PM  Result Value Ref Range   MRSA by PCR NEGATIVE NEGATIVE    Comment:        The GeneXpert MRSA Assay (FDA approved for NASAL specimens only), is one component of a comprehensive MRSA colonization surveillance program. It is not intended to diagnose MRSA infection nor to guide or monitor treatment for MRSA infections. Performed at [facility], [address], [place], [place] [address]   CBC     Status: Abnormal (Preliminary result)   Collection Time: 02/16/17  7:59 AM  Result Value Ref Range   WBC 6.3 4.0 - 10.5 K/uL   RBC 3.76 (L) 4.22 - 5.81 MIL/uL   Hemoglobin 10.7 (L) 13.0 - 17.0 g/dL   HCT 34.3 (L) 39.0 - 52.0 %   MCV 91.2 78.0 - 100.0 fL   MCH 28.5 26.0 - 34.0 pg   MCHC 31.2 30.0 - 36.0 g/dL   RDW 15.9 (H) 11.5 - 15.5 %    Comment: Performed at [facility], [address] [place],  [address]   Platelets PENDING 150 - 400 K/uL    Dg Chest 2 View  Result Date: 02/15/2017 CLINICAL DATA:  Fever and cough EXAM: CHEST  2 VIEW COMPARISON:  June 20, 2016 FINDINGS: There is airspace consolidation in the left lower lobe consistent  with pneumonia. Lungs elsewhere clear. Heart is upper normal in size with pulmonary vascularity within normal limits. There is aortic atherosclerosis. No adenopathy. No bone lesions. IMPRESSION: Left base airspace consolidation consistent with pneumonia. Lungs elsewhere clear. Heart upper normal in size. There is aortic atherosclerosis. Aortic Atherosclerosis (ICD10-I70.0). Electronically Signed   By: Lowella Grip III M.D.   On: 02/15/2017 10:41    ROS:  Review of systems not obtained due to patient factors.  Blood pressure (!) 101/54, pulse 72, temperature 98.7 F (37.1 C), temperature source Oral, resp. rate 19, height _0  (1.753 m), weight 145 lb 4.5 oz (65.9 kg), SpO2 91 %. Physical Exam: White male lying in the bed in no acute distress. Head is normocephalic, atraumatic Lungs are relatively clear without rales or rhonchi noted Heart examination reveals regular rate and rhythm without S3, S4, murmurs Abdomen is soft and flat.  A gastrostomy tube is in place.  A left-sided colostomy is present with stool and gas noted within the bag.  A large left inguinal hernia with bowel extending into the scrotum is noted.  The area is nontender.  Operative note from 2016 reviewed.  Dr. Donne Hazel did attempt a primary repair of the left inguinal hernia without mesh.  Assessment/Plan: Impression: Large left inguinal hernia, asymptomatic.  There is no evidence of incarceration, strangulation, or obstruction. Plan: At this point, patient does not require repair of the left inguinal hernia.  Will follow peripherally with you.  Should there be change, please  do not hesitate to call me.  Aviva Signs 02/16/2017, 8:56 AM

## 2017-02-16 NOTE — Progress Notes (Signed)
Pt off unit with EMS disposition [facility].

## 2017-02-16 NOTE — Progress Notes (Addendum)
[facility] ED CSW  Covering [facility] received a call from Pt's RN requesting assistance from this Probation officer.  Per RN, pt is returning to [facility] in [place] and RN was concerned there is no evidence pt's info was forwarded to SNF or that there was no FL-2.  Per RN pt was admitted on 2/7 and D/C'd today (2/8).    CSW checked on hub and saw pt's D/C summary and SNF transfer report was sent at 4:02 pm on 2/8 by the CSW.  CSW relayed this information to the RN who will call report and inform the SNF.  CSW informed RN no FL-2 will likely be needed as pt is already a SNF pt at [facility] and the FL-2 is for admission purposes generally.   Please reconsult if future social work needs arise.  CSW signing off, as social work intervention is no longer needed.  Alphonse Guild. Terral Cooks, LCSW, LCAS, CSI Clinical Social Worker Ph: [phone]

## 2017-02-16 NOTE — Progress Notes (Signed)
Unable to complete admission. Attempted to call [facility]. After being put on hold no one comes back to answer the phone.

## 2017-02-17 LAB — LEGIONELLA PNEUMOPHILA SEROGP 1 UR AG: L. PNEUMOPHILA SEROGP 1 UR AG: NEGATIVE

## 2017-02-17 LAB — HIV ANTIBODY (ROUTINE TESTING W REFLEX): HIV SCREEN 4TH GENERATION: NONREACTIVE

## 2017-02-17 LAB — STREP PNEUMONIAE URINARY ANTIGEN: STREP PNEUMO URINARY ANTIGEN: NEGATIVE

## 2017-02-20 LAB — CULTURE, BLOOD (ROUTINE X 2)
CULTURE: NO GROWTH
Culture: NO GROWTH
Special Requests: ADEQUATE
Special Requests: ADEQUATE

## 2017-03-09 ENCOUNTER — Emergency Department (HOSPITAL_COMMUNITY): Payer: Medicare Other

## 2017-03-09 ENCOUNTER — Encounter (HOSPITAL_COMMUNITY): Payer: Self-pay | Admitting: *Deleted

## 2017-03-09 ENCOUNTER — Inpatient Hospital Stay (HOSPITAL_COMMUNITY)
Admission: EM | Admit: 2017-03-09 | Discharge: 2017-03-15 | DRG: 871 | Disposition: A | Discharge status: Skilled Nursing Facility | Payer: Medicare Other | Attending: Internal Medicine | Admitting: Internal Medicine

## 2017-03-09 ENCOUNTER — Other Ambulatory Visit: Payer: Self-pay

## 2017-03-09 DIAGNOSIS — Z79899 Other long term (current) drug therapy: Secondary | ICD-10-CM

## 2017-03-09 DIAGNOSIS — Z7189 Other specified counseling: Secondary | ICD-10-CM

## 2017-03-09 DIAGNOSIS — F209 Schizophrenia, unspecified: Secondary | ICD-10-CM | POA: Diagnosis present

## 2017-03-09 DIAGNOSIS — E7800 Pure hypercholesterolemia, unspecified: Secondary | ICD-10-CM | POA: Diagnosis present

## 2017-03-09 DIAGNOSIS — Z931 Gastrostomy status: Secondary | ICD-10-CM

## 2017-03-09 DIAGNOSIS — J9601 Acute respiratory failure with hypoxia: Secondary | ICD-10-CM

## 2017-03-09 DIAGNOSIS — R569 Unspecified convulsions: Secondary | ICD-10-CM

## 2017-03-09 DIAGNOSIS — F259 Schizoaffective disorder, unspecified: Secondary | ICD-10-CM | POA: Diagnosis present

## 2017-03-09 DIAGNOSIS — H9190 Unspecified hearing loss, unspecified ear: Secondary | ICD-10-CM | POA: Diagnosis present

## 2017-03-09 DIAGNOSIS — Z79890 Hormone replacement therapy (postmenopausal): Secondary | ICD-10-CM

## 2017-03-09 DIAGNOSIS — R471 Dysarthria and anarthria: Secondary | ICD-10-CM | POA: Diagnosis present

## 2017-03-09 DIAGNOSIS — E785 Hyperlipidemia, unspecified: Secondary | ICD-10-CM | POA: Diagnosis present

## 2017-03-09 DIAGNOSIS — J690 Pneumonitis due to inhalation of food and vomit: Secondary | ICD-10-CM

## 2017-03-09 DIAGNOSIS — Z515 Encounter for palliative care: Secondary | ICD-10-CM | POA: Diagnosis present

## 2017-03-09 DIAGNOSIS — Z8673 Personal history of transient ischemic attack (TIA), and cerebral infarction without residual deficits: Secondary | ICD-10-CM

## 2017-03-09 DIAGNOSIS — K4090 Unilateral inguinal hernia, without obstruction or gangrene, not specified as recurrent: Secondary | ICD-10-CM | POA: Diagnosis present

## 2017-03-09 DIAGNOSIS — E039 Hypothyroidism, unspecified: Secondary | ICD-10-CM | POA: Diagnosis present

## 2017-03-09 DIAGNOSIS — A419 Sepsis, unspecified organism: Secondary | ICD-10-CM | POA: Diagnosis not present

## 2017-03-09 DIAGNOSIS — K4091 Unilateral inguinal hernia, without obstruction or gangrene, recurrent: Secondary | ICD-10-CM

## 2017-03-09 DIAGNOSIS — Z933 Colostomy status: Secondary | ICD-10-CM

## 2017-03-09 DIAGNOSIS — I959 Hypotension, unspecified: Secondary | ICD-10-CM | POA: Diagnosis not present

## 2017-03-09 DIAGNOSIS — D696 Thrombocytopenia, unspecified: Secondary | ICD-10-CM | POA: Diagnosis present

## 2017-03-09 DIAGNOSIS — Z66 Do not resuscitate: Secondary | ICD-10-CM | POA: Diagnosis present

## 2017-03-09 DIAGNOSIS — F17210 Nicotine dependence, cigarettes, uncomplicated: Secondary | ICD-10-CM | POA: Diagnosis present

## 2017-03-09 DIAGNOSIS — N5089 Other specified disorders of the male genital organs: Secondary | ICD-10-CM | POA: Diagnosis present

## 2017-03-09 DIAGNOSIS — G40909 Epilepsy, unspecified, not intractable, without status epilepticus: Secondary | ICD-10-CM | POA: Diagnosis present

## 2017-03-09 DIAGNOSIS — J189 Pneumonia, unspecified organism: Secondary | ICD-10-CM | POA: Diagnosis present

## 2017-03-09 DIAGNOSIS — Z7982 Long term (current) use of aspirin: Secondary | ICD-10-CM

## 2017-03-09 DIAGNOSIS — N433 Hydrocele, unspecified: Secondary | ICD-10-CM | POA: Diagnosis present

## 2017-03-09 DIAGNOSIS — R4702 Dysphasia: Secondary | ICD-10-CM | POA: Diagnosis present

## 2017-03-09 LAB — CBC WITH DIFFERENTIAL/PLATELET
BASOS PCT: 1 %
Basophils Absolute: 0 10*3/uL (ref 0.0–0.1)
EOS ABS: 0.2 10*3/uL (ref 0.0–0.7)
EOS PCT: 2 %
HCT: 40.5 % (ref 39.0–52.0)
HEMOGLOBIN: 12.5 g/dL — AB (ref 13.0–17.0)
Lymphocytes Relative: 16 %
Lymphs Abs: 1.4 10*3/uL (ref 0.7–4.0)
MCH: 28 pg (ref 26.0–34.0)
MCHC: 30.9 g/dL (ref 30.0–36.0)
MCV: 90.6 fL (ref 78.0–100.0)
MONO ABS: 0.4 10*3/uL (ref 0.1–1.0)
MONOS PCT: 5 %
NEUTROS PCT: 76 %
Neutro Abs: 6.4 10*3/uL (ref 1.7–7.7)
Platelets: 74 10*3/uL — ABNORMAL LOW (ref 150–400)
RBC: 4.47 MIL/uL (ref 4.22–5.81)
RDW: 16.5 % — AB (ref 11.5–15.5)
WBC: 8.4 10*3/uL (ref 4.0–10.5)

## 2017-03-09 LAB — URINALYSIS, ROUTINE W REFLEX MICROSCOPIC
BILIRUBIN URINE: NEGATIVE
Glucose, UA: NEGATIVE mg/dL
HGB URINE DIPSTICK: NEGATIVE
Ketones, ur: NEGATIVE mg/dL
Leukocytes, UA: NEGATIVE
NITRITE: NEGATIVE
Protein, ur: NEGATIVE mg/dL
Specific Gravity, Urine: 1.017 (ref 1.005–1.030)
pH: 6 (ref 5.0–8.0)

## 2017-03-09 LAB — COMPREHENSIVE METABOLIC PANEL
ALBUMIN: 4 g/dL (ref 3.5–5.0)
ALK PHOS: 68 U/L (ref 38–126)
ALT: 13 U/L — AB (ref 17–63)
AST: 20 U/L (ref 15–41)
Anion gap: 13 (ref 5–15)
BUN: 22 mg/dL — AB (ref 6–20)
CHLORIDE: 100 mmol/L — AB (ref 101–111)
CO2: 26 mmol/L (ref 22–32)
CREATININE: 1 mg/dL (ref 0.61–1.24)
Calcium: 9.3 mg/dL (ref 8.9–10.3)
GFR calc non Af Amer: >60 mL/min (ref >60)
GLUCOSE: 77 mg/dL (ref 65–99)
Potassium: 4.1 mmol/L (ref 3.5–5.1)
SODIUM: 139 mmol/L (ref 135–145)
Total Bilirubin: 0.2 mg/dL — ABNORMAL LOW (ref 0.3–1.2)
Total Protein: 8.8 g/dL — ABNORMAL HIGH (ref 6.5–8.1)

## 2017-03-09 LAB — I-STAT CG4 LACTIC ACID, ED: LACTIC ACID, VENOUS: 0.49 mmol/L — AB (ref 0.5–1.9)

## 2017-03-09 MED ORDER — VANCOMYCIN HCL 500 MG IV SOLR
500.0000 mg | Freq: Two times a day (BID) | INTRAVENOUS | Status: DC
  Administered 2017-03-10 – 2017-03-11 (×3): 500 mg via INTRAVENOUS
  Filled 2017-03-09 (×5): qty 500

## 2017-03-09 MED ORDER — SODIUM CHLORIDE 0.9 % IV BOLUS (SEPSIS)
1000.0000 mL | Freq: Once | INTRAVENOUS | Status: AC
  Administered 2017-03-09: 1000 mL via INTRAVENOUS

## 2017-03-09 MED ORDER — VANCOMYCIN HCL IN DEXTROSE 1-5 GM/200ML-% IV SOLN
1000.0000 mg | Freq: Once | INTRAVENOUS | Status: DC
  Filled 2017-03-09: qty 200

## 2017-03-09 MED ORDER — PIPERACILLIN-TAZOBACTAM 3.375 G IVPB 30 MIN
3.3750 g | Freq: Once | INTRAVENOUS | Status: AC
  Administered 2017-03-09: 3.375 g via INTRAVENOUS
  Filled 2017-03-09: qty 50

## 2017-03-09 MED ORDER — PIPERACILLIN-TAZOBACTAM 3.375 G IVPB
3.3750 g | Freq: Three times a day (TID) | INTRAVENOUS | Status: DC
  Administered 2017-03-10 – 2017-03-15 (×16): 3.375 g via INTRAVENOUS
  Filled 2017-03-09 (×16): qty 50

## 2017-03-09 NOTE — ED Notes (Signed)
Per Mable Fill, at [facility], the pt had a fever of 101.1 axillary around 5:30pm and was given tylenol, felt shaky, and stated he didn't feel well. Pt's scrotum is swollen upon arrival. This RN spoke again with Mable Fill, RN at [facility], this is normal for the pt.

## 2017-03-09 NOTE — ED Provider Notes (Addendum)
[facility] EMERGENCY DEPARTMENT Provider Note   CSN: [id number] Arrival date & time: 03/09/17  2007     History   Chief Complaint Chief Complaint  Patient presents with  . Hypotension    HPI Steven David is a 62 y.o. male. L5 caveat secondary to dysarthria and unknown baseline mental status and ability to give history HPI  62 year old man history of strangulated hernia status post colectomy, schizophrenia, seizures, hypothyroidism, dysphasia status post PEG tube who presents today from nursing notes with report that he had a fever to 101 today.  He was recently discharged from the hospital with healthcare associated pneumonia on 02/16/2017.  Patient was sent from nursing home and they reported fever to 101.1 axillary with shaking.  He told him he did not feel well.  He was given Tylenol.  They report that his scrotum was swollen at baseline.  They report low blood pressure and fever but no measurements were given.  Past Medical History:  Diagnosis Date  . Aggressive outburst   . Cognitive communication deficit   . Colostomy in place [facility])   . Drug-induced Parkinsonism ([place])   . Dysphagia   . Dysphagia   . Hard of hearing    Wears bilateral hearing aides  . Hernia   . High cholesterol   . HOH (hard of hearing)   . Malnutrition ([place])   . Malnutrition ([place])   . Schizoaffective disorder ([place])   . Schizophrenia ([place])   . Seizures ([place])   . Thyroid disease   . TIA (transient ischemic attack)     Patient Active Problem List   Diagnosis Date Noted  . Left inguinal hernia 02/15/2017  . Dysarthria 02/15/2017  . Malfunction of percutaneous endoscopic gastrostomy (PEG) tube ([place])   . Malnutrition of moderate degree 11/20/2014  . HCAP (healthcare-associated pneumonia) 11/19/2014  . Postoperative anemia due to acute blood loss 09/07/2014  . Seizure ([place])   . Incarcerated inguinal hernia 09/01/2014  . Respiratory failure, acute ([place]) 09/01/2014  . Metabolic acidosis  43/15/4008  . Incarcerated hernia 09/01/2014  . Septic shock ([place]) 09/01/2014  . AKI (acute kidney injury) ([place])   . Protein-calorie malnutrition, severe ([place]) 04/13/2013  . Sepsis ([place]) 04/11/2013  . H/O stroke within last year 04/11/2013  . Hypothyroidism 04/11/2013  . Pneumonia 04/11/2013  . Thrombocytopenia ([place]) 04/11/2013  . Schizophrenia ([place]) 04/11/2013  . Malnutrition ([place]) 04/11/2013  . SIRS (systemic inflammatory response syndrome) ([place]) 04/11/2013    Past Surgical History:  Procedure Laterality Date  . ESOPHAGOGASTRODUODENOSCOPY (EGD) WITH PROPOFOL N/A 09/08/2014   Procedure: ESOPHAGOGASTRODUODENOSCOPY (EGD) WITH PROPOFOL;  Surgeon: Georganna Skeans, MD;  Location: [place];  Service: Endoscopy;  Laterality: N/A;  . LAPAROTOMY N/A 09/01/2014   Procedure: EXPLORATORY LAPAROTOMY, SIGMOID COLECTOMY, SIGMOID COLOSTOMY, PRIMARY REPAIR OF STRANGULATED INGUINAL HERNIA.;  Surgeon: Rolm Bookbinder, MD;  Location: [place];  Service: General;  Laterality: N/A;  . LEG SURGERY    . PEG PLACEMENT N/A 09/08/2014   Procedure: PERCUTANEOUS ENDOSCOPIC GASTROSTOMY (PEG) PLACEMENT;  Surgeon: Georganna Skeans, MD;  Location: [place];  Service: Endoscopy;  Laterality: N/A;  . PEG PLACEMENT N/A 06/30/2015   Procedure: PERCUTANEOUS ENDOSCOPIC GASTROSTOMY (PEG) REPLACEMENT;  Surgeon: Daneil Dolin, MD;  [facility];  Service: Endoscopy;  Laterality: N/A;  1100  . PEG PLACEMENT N/A 12/30/2015   Procedure: PERCUTANEOUS ENDOSCOPIC GASTROSTOMY (PEG) REPLACEMENT;  Surgeon: Daneil Dolin, MD;  [facility];  Service: Endoscopy;  Laterality: N/A;  . PEG PLACEMENT N/A 08/24/2016  Procedure: PERCUTANEOUS ENDOSCOPIC GASTROSTOMY (PEG) REPLACEMENT;  Surgeon: Daneil Dolin, MD;  [facility];  Service: Endoscopy;  Laterality: N/A;  . TESTICLE SURGERY         Home Medications    Prior to Admission medications   Medication Sig Start Date End Date Taking? Authorizing  Provider  acetaminophen (TYLENOL) 325 MG tablet Place 650 mg into feeding tube every 6 (six) hours as needed for moderate pain or fever.    [provider]  aspirin 325 MG tablet Take 325 mg by mouth daily.    [provider]  cholecalciferol (VITAMIN D) 1000 units tablet Take 1,000 Units by mouth daily.    [provider]  divalproex (DEPAKOTE SPRINKLE) 125 MG capsule Take 625 mg by mouth 2 (two) times daily.    [provider]  ferrous sulfate 325 (65 FE) MG tablet 325 mg daily with breakfast. Per tube    [provider]  furosemide (LASIX) 20 MG tablet Place 10 mg into feeding tube 2 (two) times daily.     [provider]  guaifenesin (HUMIBID E) 400 MG TABS tablet Take 400 mg by mouth 2 (two) times daily. Per tube     [provider]  haloperidol (HALDOL) 10 MG tablet Take one (1) tablet by mouth at bedtime 11/14/16 03/13/17  [provider]  haloperidol decanoate (HALDOL DECANOATE) 50 MG/ML injection Inject 100 mg into the muscle every 28 (twenty-eight) days.    [provider]  levETIRAcetam (KEPPRA) 250 MG tablet Take 250 mg by mouth 2 (two) times daily. Per tube    [provider]  levothyroxine (SYNTHROID, LEVOTHROID) 100 MCG tablet Take 100 mcg by mouth daily before breakfast.    [provider]  loratadine (CLARITIN) 10 MG tablet Place 1 tablet (10 mg total) into feeding tube daily. 09/10/14   Nat Christen, PA-C  lovastatin (MEVACOR) 20 MG tablet Place 1 tablet (20 mg total) into feeding tube daily. 09/10/14   Nat Christen, PA-C  Nutritional Supplements (FEEDING SUPPLEMENT, JEVITY 1.5 CAL/FIBER,) LIQD Place 1,000 mLs into feeding tube continuous. Initiate nocturnal feedings of Jevity 1.5 @ 100 ml/hr via PEG over 14 hour period 09/10/14   Nat Christen, PA-C  OLANZapine zydis (ZYPREXA) 5 MG disintegrating tablet Take 5 mg by mouth at bedtime.    [provider]  oxybutynin (DITROPAN) 5 MG  tablet Take 5 mg by mouth 2 (two) times daily.    [provider]  polyethylene glycol (MIRALAX / GLYCOLAX) packet Take 17 g by mouth daily.    [provider]  vitamin C (ASCORBIC ACID) 500 MG tablet Take 500 mg by mouth 2 (two) times daily.     [provider]    Family History Family History  Family history unknown: Yes    Social History Social History   Tobacco Use  . Smoking status: Current Every Day Smoker  . Smokeless tobacco: Never Used  Substance Use Topics  . Alcohol use: No  . Drug use: No     Allergies   Patient has no known allergies.   Review of Systems Review of Systems  Unable to perform ROS: Other  All other systems reviewed and are negative.    Physical Exam Updated Vital Signs BP (!) 100/48 (BP Location: Right Arm)   Pulse (!) 107   Temp (!) 100.9 F (38.3 C) (Rectal)   Resp (!) 22   Ht 1.753 m (5\' 9" )   Wt  65.8 kg (145 lb)   SpO2 98%   BMI 21.41 kg/m   Physical Exam  Constitutional: He appears well-developed and well-nourished.  HENT:  Head: Normocephalic and atraumatic.  Right Ear: External ear normal.  Left Ear: External ear normal.  Lucas membranes appear somewhat dry  Eyes:  Disconjugate gaze  Neck: Normal range of motion. Neck supple.  Cardiovascular: Tachycardia present.  Pulmonary/Chest:  Decreased breath sounds throughout no crackles, rales, wheezing noted  Abdominal: Soft.  Patient with PEG tube and ostomy.  Abdomen is soft does not appear tender  Genitourinary:  Genitourinary Comments: Large inguinal hernia with scrotal swelling.  This appears soft but there does appear some pain when I attempt to reduce  Musculoskeletal: Normal range of motion.  Neurological: He is alert.  Patient is alert and follows my directions.  Bilateral arms have equal strength.  Bilateral legs have equal strength.  He does have disconjugate gaze as noted in previous exams.  Skin: Skin is warm. Capillary refill takes  less than 2 seconds.  Nursing note and vitals reviewed.    ED Treatments / Results  Labs (all labs ordered are listed, but only abnormal results are displayed) Labs Reviewed  CULTURE, BLOOD (ROUTINE X 2)  CULTURE, BLOOD (ROUTINE X 2)  COMPREHENSIVE METABOLIC PANEL  CBC WITH DIFFERENTIAL/PLATELET  URINALYSIS, ROUTINE W REFLEX MICROSCOPIC  I-STAT CG4 LACTIC ACID, ED    EKG  EKG Interpretation  Date/Time:  Friday March 09 2017 20:49:11 EST Ventricular Rate:  103 PR Interval:    QRS Duration: 102 QT Interval:  346 QTC Calculation: 453 R Axis:   8 Text Interpretation:  Sinus tachycardia Abnormal R-wave progression, late transition Nonspecific ST and T wave abnormality No significant change since last tracing Confirmed by Varney Biles [phone]) on 03/11/2017 8:09:04 PM       Radiology Dg Chest Portable 1 View  Result Date: 03/09/2017 CLINICAL DATA:  Low blood pressure with fever and cough EXAM: PORTABLE CHEST 1 VIEW COMPARISON:  02/15/2017, 06/20/2016 FINDINGS: Persistent airspace disease at the left lung base. Streaky atelectasis at the right base. No large effusion. Mild cardiomegaly with slight central vascular congestion. Aortic atherosclerosis. No pneumothorax. IMPRESSION: 1. Persistent airspace disease at the left base suspicious for pneumonia. Possible tiny left effusion 2. Mild cardiomegaly with central vascular congestion Electronically Signed   By: Donavan Foil M.D.   On: 03/09/2017 21:11    Procedures Procedures (including critical care time)  Medications Ordered in ED Medications  sodium chloride 0.9 % bolus 1,000 mL (not administered)    And  sodium chloride 0.9 % bolus 1,000 mL (not administered)  piperacillin-tazobactam (ZOSYN) IVPB 3.375 g (not administered)  vancomycin (VANCOCIN) IVPB 1000 mg/200 mL premix (not administered)     Initial Impression / Assessment and Plan / ED Course  I have reviewed the triage vital signs and the nursing notes.  Pertinent  labs & imaging results that were available during my care of the patient were reviewed by me and considered in my medical decision making (see chart for details).    10:42 PM Sepsis - Repeat Assessment  Performed at:    10:43 PM   Vitals     Blood pressure 107/70, pulse 98, temperature (!) 100.9 F (38.3 C), temperature source Rectal, resp. rate 16, height 1.753 m (5\' 9" ), weight 65.8 kg (145 lb), SpO2 97 %.  Heart:     Regular rate and rhythm  Lungs:    CTA  Capillary Refill:   <2 sec  Peripheral Pulse:   Radial pulse palpable  Skin:     Pale   62 yo [place] with difficulty in communication multifactorial including dysarthria , schizophrenia, here with febrile illness.  No definite source on exam. Patient reportedly eats although peg in place due to aspiration risk.  Febrile here with borderline hypotension.  Broad spectrum antibiotics started, blood cultures sent. Flu swab ordered and pending.  Vitals normalized after fluids, abx, and antipyretics.  Patient with large left inguinal hernia previously noted and appears soft but slightly tender. No evidence of incarceration or obstruction with good bowel sound.  No skin infection of perineum noted on my exam.  This may need further evaluation or surgical consult but does not appear to be acute and does not appear to necessitate emergent imaging or emergent surgical consult tonight.   Vitals:   03/09/17 2200 03/09/17 2230  BP: 107/70 (!) 124/58  Pulse: 98 (!) 101  Resp: 16 (!) 23  Temp:    SpO2: 97% 100%   Discussed with Dr. Manuella Ghazi and he will see for admission. CRITICAL CARE Performed by: Pattricia Boss Total critical care time: 45 minutes Critical care time was exclusive of separately billable procedures and treating other patients. Critical care was necessary to treat or prevent imminent or life-threatening deterioration. Critical care was time spent personally by me on the following activities: development of treatment plan  with patient and/or surrogate as well as nursing, discussions with consultants, evaluation of patient's response to treatment, examination of patient, obtaining history from patient or surrogate, ordering and performing treatments and interventions, ordering and review of laboratory studies, ordering and review of radiographic studies, pulse oximetry and re-evaluation of patient's condition.   Final Clinical Impressions(s) / ED Diagnoses   Final diagnoses:  Sepsis, due to unspecified organism ([place])  Unilateral recurrent inguinal hernia without obstruction or gangrene    ED Discharge Orders    None       Pattricia Boss, MD 03/09/17 2317    Pattricia Boss, MD 04/05/17 [phone]

## 2017-03-09 NOTE — ED Triage Notes (Signed)
Pt arrived by EMS from [facility]. Pt bought to facility for low blood pressure & fever. Pt was given tylenol at facility unknown time.

## 2017-03-09 NOTE — Progress Notes (Signed)
Pharmacy Antibiotic Note  Steven David is a 62 y.o. male admitted on 03/09/2017 with sepsis.  Pharmacy has been consulted for vancomycin and zosyn dosing.Vanc 1 gm and zosyn 3.375gm ordered in the ED  Plan: Cont zosyn 3.375 gm IV q8 hours Cont vancomycin 500 mg IV q12 hours F/u renal function, cultures and clinical course  Height: 5\' 9"  (175.3 cm) Weight: 145 lb (65.8 kg) IBW/kg (Calculated) : 70.7  Temp (24hrs), Avg:100.3 F (37.9 C), Min:99.6 F (37.6 C), Max:100.9 F (38.3 C)  No results for input(s): WBC, CREATININE, LATICACIDVEN, VANCOTROUGH, VANCOPEAK, VANCORANDOM, GENTTROUGH, GENTPEAK, GENTRANDOM, TOBRATROUGH, TOBRAPEAK, TOBRARND, AMIKACINPEAK, AMIKACINTROU, AMIKACIN in the last 168 hours.  CrCl cannot be calculated (Patient's most recent lab result is older than the maximum 21 days allowed.).    No Known Allergies  Antimicrobials this admission: vanc 3/1 >>  zosyn 3/1 >>    Thank you for allowing pharmacy to be a part of this patient's care.  Excell Seltzer [place] 03/09/2017 9:52 PM

## 2017-03-09 NOTE — ED Notes (Signed)
Blood cultures obtained prior to starting antibiotics

## 2017-03-10 DIAGNOSIS — G40909 Epilepsy, unspecified, not intractable, without status epilepticus: Secondary | ICD-10-CM | POA: Diagnosis present

## 2017-03-10 DIAGNOSIS — F209 Schizophrenia, unspecified: Secondary | ICD-10-CM | POA: Diagnosis not present

## 2017-03-10 DIAGNOSIS — R569 Unspecified convulsions: Secondary | ICD-10-CM

## 2017-03-10 DIAGNOSIS — Z931 Gastrostomy status: Secondary | ICD-10-CM | POA: Diagnosis not present

## 2017-03-10 DIAGNOSIS — K4091 Unilateral inguinal hernia, without obstruction or gangrene, recurrent: Secondary | ICD-10-CM | POA: Diagnosis not present

## 2017-03-10 DIAGNOSIS — R4702 Dysphasia: Secondary | ICD-10-CM | POA: Diagnosis present

## 2017-03-10 DIAGNOSIS — N433 Hydrocele, unspecified: Secondary | ICD-10-CM | POA: Diagnosis present

## 2017-03-10 DIAGNOSIS — Z79890 Hormone replacement therapy (postmenopausal): Secondary | ICD-10-CM | POA: Diagnosis not present

## 2017-03-10 DIAGNOSIS — H9190 Unspecified hearing loss, unspecified ear: Secondary | ICD-10-CM | POA: Diagnosis present

## 2017-03-10 DIAGNOSIS — D696 Thrombocytopenia, unspecified: Secondary | ICD-10-CM

## 2017-03-10 DIAGNOSIS — Z515 Encounter for palliative care: Secondary | ICD-10-CM | POA: Diagnosis not present

## 2017-03-10 DIAGNOSIS — E038 Other specified hypothyroidism: Secondary | ICD-10-CM

## 2017-03-10 DIAGNOSIS — Z8673 Personal history of transient ischemic attack (TIA), and cerebral infarction without residual deficits: Secondary | ICD-10-CM | POA: Diagnosis not present

## 2017-03-10 DIAGNOSIS — J9601 Acute respiratory failure with hypoxia: Secondary | ICD-10-CM | POA: Diagnosis not present

## 2017-03-10 DIAGNOSIS — Z933 Colostomy status: Secondary | ICD-10-CM | POA: Diagnosis not present

## 2017-03-10 DIAGNOSIS — A419 Sepsis, unspecified organism: Secondary | ICD-10-CM | POA: Diagnosis not present

## 2017-03-10 DIAGNOSIS — Z66 Do not resuscitate: Secondary | ICD-10-CM | POA: Diagnosis present

## 2017-03-10 DIAGNOSIS — N5089 Other specified disorders of the male genital organs: Secondary | ICD-10-CM | POA: Diagnosis present

## 2017-03-10 DIAGNOSIS — Z7189 Other specified counseling: Secondary | ICD-10-CM | POA: Diagnosis not present

## 2017-03-10 DIAGNOSIS — F2089 Other schizophrenia: Secondary | ICD-10-CM | POA: Diagnosis not present

## 2017-03-10 DIAGNOSIS — Z79899 Other long term (current) drug therapy: Secondary | ICD-10-CM | POA: Diagnosis not present

## 2017-03-10 DIAGNOSIS — F259 Schizoaffective disorder, unspecified: Secondary | ICD-10-CM | POA: Diagnosis present

## 2017-03-10 DIAGNOSIS — F17210 Nicotine dependence, cigarettes, uncomplicated: Secondary | ICD-10-CM | POA: Diagnosis present

## 2017-03-10 DIAGNOSIS — J690 Pneumonitis due to inhalation of food and vomit: Secondary | ICD-10-CM

## 2017-03-10 DIAGNOSIS — E039 Hypothyroidism, unspecified: Secondary | ICD-10-CM | POA: Diagnosis present

## 2017-03-10 DIAGNOSIS — K4090 Unilateral inguinal hernia, without obstruction or gangrene, not specified as recurrent: Secondary | ICD-10-CM | POA: Diagnosis present

## 2017-03-10 DIAGNOSIS — E7800 Pure hypercholesterolemia, unspecified: Secondary | ICD-10-CM | POA: Diagnosis present

## 2017-03-10 DIAGNOSIS — E785 Hyperlipidemia, unspecified: Secondary | ICD-10-CM | POA: Diagnosis present

## 2017-03-10 DIAGNOSIS — R471 Dysarthria and anarthria: Secondary | ICD-10-CM | POA: Diagnosis present

## 2017-03-10 DIAGNOSIS — Z7982 Long term (current) use of aspirin: Secondary | ICD-10-CM | POA: Diagnosis not present

## 2017-03-10 LAB — STREP PNEUMONIAE URINARY ANTIGEN: Strep Pneumo Urinary Antigen: NEGATIVE

## 2017-03-10 LAB — COMPREHENSIVE METABOLIC PANEL
ALBUMIN: 3.1 g/dL — AB (ref 3.5–5.0)
ALK PHOS: 56 U/L (ref 38–126)
ALT: 10 U/L — ABNORMAL LOW (ref 17–63)
ANION GAP: 9 (ref 5–15)
AST: 21 U/L (ref 15–41)
BUN: 15 mg/dL (ref 6–20)
CHLORIDE: 107 mmol/L (ref 101–111)
CO2: 24 mmol/L (ref 22–32)
Calcium: 8.3 mg/dL — ABNORMAL LOW (ref 8.9–10.3)
Creatinine, Ser: 0.86 mg/dL (ref 0.61–1.24)
GFR calc Af Amer: >60 mL/min (ref >60)
GFR calc non Af Amer: >60 mL/min (ref >60)
Glucose, Bld: 92 mg/dL (ref 65–99)
POTASSIUM: 4 mmol/L (ref 3.5–5.1)
SODIUM: 140 mmol/L (ref 135–145)
Total Bilirubin: 0.4 mg/dL (ref 0.3–1.2)
Total Protein: 7 g/dL (ref 6.5–8.1)

## 2017-03-10 LAB — CBC
HEMATOCRIT: 36.5 % — AB (ref 39.0–52.0)
HEMOGLOBIN: 11.4 g/dL — AB (ref 13.0–17.0)
MCH: 28.1 pg (ref 26.0–34.0)
MCHC: 31.2 g/dL (ref 30.0–36.0)
MCV: 89.9 fL (ref 78.0–100.0)
Platelets: 75 10*3/uL — ABNORMAL LOW (ref 150–400)
RBC: 4.06 MIL/uL — ABNORMAL LOW (ref 4.22–5.81)
RDW: 16.5 % — ABNORMAL HIGH (ref 11.5–15.5)
WBC: 7.5 10*3/uL (ref 4.0–10.5)

## 2017-03-10 LAB — LACTIC ACID, PLASMA: Lactic Acid, Venous: 0.7 mmol/L (ref 0.5–1.9)

## 2017-03-10 LAB — INFLUENZA PANEL BY PCR (TYPE A & B)
INFLBPCR: NEGATIVE
Influenza A By PCR: NEGATIVE

## 2017-03-10 LAB — PROCALCITONIN: Procalcitonin: 0.35 ng/mL

## 2017-03-10 LAB — GLUCOSE, CAPILLARY: GLUCOSE-CAPILLARY: 74 mg/dL (ref 65–99)

## 2017-03-10 LAB — I-STAT CG4 LACTIC ACID, ED: LACTIC ACID, VENOUS: 2.46 mmol/L — AB (ref 0.5–1.9)

## 2017-03-10 MED ORDER — ONDANSETRON HCL 4 MG/2ML IJ SOLN
4.0000 mg | Freq: Four times a day (QID) | INTRAMUSCULAR | Status: DC | PRN
Start: 2017-03-10 — End: 2017-03-15

## 2017-03-10 MED ORDER — OLANZAPINE 5 MG PO TBDP
5.0000 mg | ORAL_TABLET | Freq: Every day | ORAL | Status: DC
  Administered 2017-03-10 – 2017-03-14 (×5): 5 mg via ORAL
  Filled 2017-03-10 (×6): qty 1

## 2017-03-10 MED ORDER — JEVITY 1.5 CAL/FIBER PO LIQD
1000.0000 mL | ORAL | Status: AC
  Administered 2017-03-10: 1000 mL
  Filled 2017-03-10: qty 1000

## 2017-03-10 MED ORDER — IOPAMIDOL (ISOVUE-300) INJECTION 61%
100.0000 mL | Freq: Once | INTRAVENOUS | Status: AC | PRN
  Administered 2017-03-10: 100 mL via INTRAVENOUS

## 2017-03-10 MED ORDER — GUAIFENESIN 100 MG/5ML PO SOLN
20.0000 mL | Freq: Two times a day (BID) | ORAL | Status: DC
  Administered 2017-03-10 – 2017-03-15 (×11): 400 mg via ORAL
  Filled 2017-03-10 (×3): qty 20
  Filled 2017-03-10: qty 5
  Filled 2017-03-10: qty 20
  Filled 2017-03-10 (×2): qty 15
  Filled 2017-03-10 (×2): qty 5
  Filled 2017-03-10 (×2): qty 20
  Filled 2017-03-10: qty 15
  Filled 2017-03-10 (×2): qty 5

## 2017-03-10 MED ORDER — SODIUM CHLORIDE 0.9 % IV SOLN
INTRAVENOUS | Status: AC
  Administered 2017-03-10 (×2): via INTRAVENOUS

## 2017-03-10 MED ORDER — LORATADINE 10 MG PO TABS
10.0000 mg | ORAL_TABLET | Freq: Every day | ORAL | Status: DC
  Administered 2017-03-10 – 2017-03-15 (×6): 10 mg via ORAL
  Filled 2017-03-10 (×6): qty 1

## 2017-03-10 MED ORDER — GUAIFENESIN 200 MG PO TABS
400.0000 mg | ORAL_TABLET | Freq: Two times a day (BID) | ORAL | Status: DC
  Filled 2017-03-10 (×5): qty 2

## 2017-03-10 MED ORDER — VITAMIN C 500 MG PO TABS
500.0000 mg | ORAL_TABLET | Freq: Two times a day (BID) | ORAL | Status: DC
  Administered 2017-03-10 – 2017-03-15 (×11): 500 mg via ORAL
  Filled 2017-03-10 (×11): qty 1

## 2017-03-10 MED ORDER — DIVALPROEX SODIUM 125 MG PO CSDR
625.0000 mg | DELAYED_RELEASE_CAPSULE | Freq: Two times a day (BID) | ORAL | Status: DC
  Administered 2017-03-10 – 2017-03-15 (×11): 625 mg via ORAL
  Filled 2017-03-10 (×14): qty 5

## 2017-03-10 MED ORDER — FERROUS SULFATE 325 (65 FE) MG PO TABS
325.0000 mg | ORAL_TABLET | Freq: Every day | ORAL | Status: DC
  Administered 2017-03-10 – 2017-03-15 (×6): 325 mg via ORAL
  Filled 2017-03-10 (×6): qty 1

## 2017-03-10 MED ORDER — POLYETHYLENE GLYCOL 3350 17 G PO PACK
17.0000 g | PACK | Freq: Every day | ORAL | Status: DC
  Administered 2017-03-10 – 2017-03-15 (×6): 17 g via ORAL
  Filled 2017-03-10 (×6): qty 1

## 2017-03-10 MED ORDER — LORATADINE 10 MG PO TABS: 10.0000 mg | ORAL_TABLET | Freq: Every day | ORAL | Status: DC

## 2017-03-10 MED ORDER — VITAMIN D3 25 MCG (1000 UNIT) PO TABS
1000.0000 [IU] | ORAL_TABLET | Freq: Every day | ORAL | Status: DC
  Administered 2017-03-10 – 2017-03-15 (×6): 1000 [IU] via ORAL
  Filled 2017-03-10 (×13): qty 1

## 2017-03-10 MED ORDER — LEVOTHYROXINE SODIUM 50 MCG PO TABS
100.0000 ug | ORAL_TABLET | Freq: Every day | ORAL | Status: DC
  Administered 2017-03-10 – 2017-03-15 (×6): 100 ug via ORAL
  Filled 2017-03-10: qty 4
  Filled 2017-03-10 (×2): qty 2
  Filled 2017-03-10: qty 4
  Filled 2017-03-10: qty 2
  Filled 2017-03-10: qty 4
  Filled 2017-03-10: qty 2
  Filled 2017-03-10: qty 4
  Filled 2017-03-10 (×2): qty 2
  Filled 2017-03-10: qty 4
  Filled 2017-03-10 (×2): qty 2

## 2017-03-10 MED ORDER — LEVETIRACETAM 250 MG PO TABS
250.0000 mg | ORAL_TABLET | Freq: Two times a day (BID) | ORAL | Status: DC
  Administered 2017-03-10 – 2017-03-14 (×11): 250 mg via ORAL
  Filled 2017-03-10 (×12): qty 1

## 2017-03-10 MED ORDER — OXYBUTYNIN CHLORIDE 5 MG PO TABS
5.0000 mg | ORAL_TABLET | Freq: Two times a day (BID) | ORAL | Status: DC
  Administered 2017-03-10 – 2017-03-15 (×12): 5 mg via ORAL
  Filled 2017-03-10 (×12): qty 1

## 2017-03-10 MED ORDER — JEVITY 1.5 CAL/FIBER PO LIQD
1000.0000 mL | ORAL | Status: DC
  Filled 2017-03-10 (×7): qty 1000

## 2017-03-10 MED ORDER — HALOPERIDOL 5 MG PO TABS
10.0000 mg | ORAL_TABLET | Freq: Every day | ORAL | Status: DC
  Administered 2017-03-10 – 2017-03-14 (×5): 10 mg via ORAL
  Filled 2017-03-10 (×6): qty 2

## 2017-03-10 MED ORDER — PRAVASTATIN SODIUM 10 MG PO TABS
10.0000 mg | ORAL_TABLET | Freq: Every day | ORAL | Status: DC
  Administered 2017-03-10 – 2017-03-14 (×5): 10 mg via ORAL
  Filled 2017-03-10 (×5): qty 1

## 2017-03-10 MED ORDER — ACETAMINOPHEN 325 MG PO TABS
650.0000 mg | ORAL_TABLET | Freq: Four times a day (QID) | ORAL | Status: DC | PRN
  Administered 2017-03-10 (×2): 650 mg
  Filled 2017-03-10 (×3): qty 2

## 2017-03-10 MED ORDER — ONDANSETRON HCL 4 MG PO TABS
4.0000 mg | ORAL_TABLET | Freq: Four times a day (QID) | ORAL | Status: DC | PRN
  Administered 2017-03-14: 4 mg via ORAL
  Filled 2017-03-10: qty 1

## 2017-03-10 NOTE — ED Notes (Signed)
This RN went into this pt's room, to attempt to get him to drink more contrast. Pt was asleep. This RN woke pt up to get him to drink and he took 2 big sips and refused to drink anymore.

## 2017-03-10 NOTE — Progress Notes (Signed)
SLP Cancellation Note  Patient Details Name: Steven David MRN: [id number] DOB: September 09, 1955   Cancelled treatment:       Reason Eval/Treat Not Completed: Other (comment); Acknowledge new BSE requested for [name]. Pt with known severe pharyngeal dysphagia and Pt previously accepted risk of aspiration to continue oral feeds. He is on a soft diet with thin liquids at [employer]. SLP will be in Sunday AM and will see Pt.  Thank you,  Genene Churn, [place]    [place] 03/10/2017, 11:38 AM

## 2017-03-10 NOTE — ED Notes (Signed)
Pt stating he doesn't want to drink anymore contrast. Gwyndolyn Saxon, CT and Dr. Manuella Ghazi made aware

## 2017-03-10 NOTE — Progress Notes (Signed)
PROGRESS NOTE  Steven David KDX:[id number] DOB: February 26, 1955 DOA: 03/09/2017 PCP: Hilbert Corrigan, MD  Brief History:  62 year old male with a history of dysarthria, dysphasia, dysphagia, schizoaffective disorder, seizure disorder, hyperlipidemia, hypothyroidism presenting from his nursing facility with fever and low blood pressure.  The patient is a very difficult historian secondary to his severe dysarthria.  Furthermore, he is a poor historian at baseline.  The patient was recently admitted to the hospital from 02/15/17 through 02/16/17 for HCAP.  He was discharged with Amox/clav.  According to the medical records, the patient is on mechanical soft diet with thin liquids at his nursing facility.  His gastrostomy tube is used only for medications.  The review of his medical records, it appears that the patient has severe dysphagia and was previously recommended to be n.p.o. status with all feeding/medications to be going through PEG tube.  Patient continued to eat and drink.  He accepted the risks of aspiration and subsequent pneumonia.  This was addressed again with the patient during his last hospita admission 1 month ago, and the patient wanted to continue eating despite the risks of aspiration.  Upon presentation, the patient had a fever up to 102.0 F and soft blood pressures with tachycardia.  The patient was started on vancomycin and Zosyn.     Assessment/Plan: sepsis -Secondary to aspiration pneumonia -Continue IV fluids -Lactic acid peaked at 2.46>>> 0.7 -Procalcitonin 0.35 -Continue vancomycin and Zosyn pending culture data  Aspiration pneumonia -Continue Zosyn -Speech therapy evaluation -Continue mechanical soft diet for now -Personally reviewed chest x-ray--bibasilar opacities left greater than right  -03/09/2017 CT chest--bilateral lower lobe airspace disease, left greater than right.  Peribronchial thickening bilateral.  Thrombocytopenia -This has been  chronic -Monitor for signs of bleeding -A.m. CBC  Hypothyroidism -Continue Synthroid  Schizoaffective disorder -Continue Haldol, Zyprexa, Depakote   seizure disorder -Continue Keppra  Hyperlipidemia -Continue statin    Disposition Plan:   SNF 2-3 days Family Communication:  No Family at bedside--Total time spent 35 minutes.  Greater than 50% spent face to face counseling and coordinating care.   Consultants:  none  Code Status:   DNR  DVT Prophylaxis:  SCDs   Procedures: As Listed in Progress Note Above  Antibiotics: vanco 3/1>>> Zosyn 3/1>>>    Subjective: Patient states that he feels hungry.  He denies any nausea, vomiting, diarrhea, abdominal pain, headache, neck pain.  He denies any shortness of breath.  He has a nonproductive cough.  Objective: Vitals:   03/10/17 0500 03/10/17 0600 03/10/17 0800 03/10/17 0850  BP: 101/66 (!) 86/48 (!) 89/52 (!) 109/56  Pulse: (!) 101 96 88 89  Resp: (!) 22 18 (!) 23 13  Temp:   98.1 F (36.7 C)   TempSrc:      SpO2: 96% 92% 96% 92%  Weight:      Height:        Intake/Output Summary (Last 24 hours) at 03/10/2017 0942 Last data filed at 03/10/2017 0900 Gross per 24 hour  Intake 7359.58 ml  Output 1600 ml  Net 5759.58 ml   Weight change:  Exam:   General:  Pt is alert, follows commands appropriately, not in acute distress  HEENT: No icterus, No thrush, No neck mass, [place]/AT  Cardiovascular: RRR, S1/S2, no rubs, no gallops  Respiratory: Bibasilar rales, right greater than left.  No wheezing.  Abdomen: Soft/+BS, non tender, non distended, no guarding  Extremities: No edema, No lymphangitis, No  petechiae, No rashes, no synovitis   Data Reviewed: I have personally reviewed following labs and imaging studies Basic Metabolic Panel: Recent Labs  Lab 03/09/17 2155 03/10/17 0427  NA 139 140  K 4.1 4.0  CL 100* 107  CO2 26 24  GLUCOSE 77 92  BUN 22* 15  CREATININE 1.00 0.86  CALCIUM 9.3 8.3*   Liver  Function Tests: Recent Labs  Lab 03/09/17 2155 03/10/17 0427  AST 20 21  ALT 13* 10*  ALKPHOS 68 56  BILITOT 0.2* 0.4  PROT 8.8* 7.0  ALBUMIN 4.0 3.1*   No results for input(s): LIPASE, AMYLASE in the last 168 hours. No results for input(s): AMMONIA in the last 168 hours. Coagulation Profile: No results for input(s): INR, PROTIME in the last 168 hours. CBC: Recent Labs  Lab 03/09/17 2155 03/10/17 0427  WBC 8.4 7.5  NEUTROABS 6.4  --   HGB 12.5* 11.4*  HCT 40.5 36.5*  MCV 90.6 89.9  PLT 74* 75*   Cardiac Enzymes: No results for input(s): CKTOTAL, CKMB, CKMBINDEX, TROPONINI in the last 168 hours. BNP: Invalid input(s): POCBNP CBG: Recent Labs  Lab 03/10/17 0834  GLUCAP 74   HbA1C: No results for input(s): HGBA1C in the last 72 hours. Urine analysis:    Component Value Date/Time   COLORURINE YELLOW 03/09/2017 2204   APPEARANCEUR CLEAR 03/09/2017 2204   LABSPEC 1.017 03/09/2017 2204   PHURINE 6.0 03/09/2017 2204   GLUCOSEU NEGATIVE 03/09/2017 2204   HGBUR NEGATIVE 03/09/2017 [place] 03/09/2017 [place] 03/09/2017 2204   PROTEINUR NEGATIVE 03/09/2017 2204   UROBILINOGEN 1.0 11/19/2014 0152   NITRITE NEGATIVE 03/09/2017 2204   LEUKOCYTESUR NEGATIVE 03/09/2017 2204   Sepsis Labs: @LABRCNTIP (procalcitonin:4,lacticidven:4) ) Recent Results (from the past 240 hour(s))  Blood Culture (routine x 2)     Status: None (Preliminary result)   Collection Time: 03/09/17  9:55 PM  Result Value Ref Range Status   Specimen Description RIGHT ANTECUBITAL  Final   Special Requests   Final    BOTTLES DRAWN AEROBIC AND ANAEROBIC Blood Culture adequate volume DRAWN BY RN   Culture   Final    NO GROWTH < 12 HOURS Performed at [facility], [address], [place], [place] [address]    Report Status PENDING  Incomplete  Blood Culture (routine x 2)     Status: None (Preliminary result)   Collection Time: 03/09/17 10:05 PM  Result Value Ref  Range Status   Specimen Description BLOOD LEFT HAND  Final   Special Requests   Final    BOTTLES DRAWN AEROBIC ONLY Blood Culture results may not be optimal due to an inadequate volume of blood received in culture bottles   Culture   Final    NO GROWTH < 12 HOURS Performed at [facility], [address], [place], [place] [address]    Report Status PENDING  Incomplete     Scheduled Meds: . cholecalciferol  1,000 Units Oral Daily  . divalproex  625 mg Oral BID  . feeding supplement (JEVITY 1.5 CAL/FIBER)  1,000 mL Per Tube Q24H  . ferrous sulfate  325 mg Oral Q breakfast  . guaiFENesin  20 mL Oral BID  . haloperidol  10 mg Oral QHS  . levETIRAcetam  250 mg Oral BID  . levothyroxine  100 mcg Oral QAC breakfast  . loratadine  10 mg Oral Daily  . OLANZapine zydis  5 mg Oral QHS  . oxybutynin  5 mg  Oral BID  . polyethylene glycol  17 g Oral Daily  . pravastatin  10 mg Oral q1800  . vitamin C  500 mg Oral BID   Continuous Infusions: . sodium chloride 125 mL/hr at 03/10/17 0429  . piperacillin-tazobactam (ZOSYN)  IV 3.375 g (03/10/17 0527)  . vancomycin      Procedures/Studies: Ct Abdomen Pelvis W Wo Contrast  Result Date: 03/10/2017 CLINICAL DATA:  Fever today.  Scrotal swelling. EXAM: CT CHEST, ABDOMEN, AND PELVIS WITHOUT AND CT ABDOMEN AND PELVIS WITH CONTRAST TECHNIQUE: Multidetector CT imaging of the chest, abdomen and pelvis was performed following the standard protocol before bolus administration of intravenous contrast. CT abdomen and pelvis was then performed after bolus administration of intravenous contrast. CONTRAST:  100 ml ISOVUE-300 IOPAMIDOL (ISOVUE-300) INJECTION 61% COMPARISON:  Single-view of the chest 03/09/2017. CT abdomen and pelvis 09/01/2014. CT chest 05/01/2013. FINDINGS: CT CHEST FINDINGS Cardiovascular: Heart size is normal. No pleural or pericardial effusion. Aortic atherosclerosis noted. No aneurysm. Mediastinum/Nodes: 1.5 cm left precarinal node noted. No  hiatal hernia. Thyroid appears normal. Lungs/Pleura: No pleural effusion. The lungs demonstrate emphysematous disease. Bilateral lower lobe airspace disease is worse on the left. Peribronchial thickening is seen in the lower lobes bilaterally. No nodule or mass. Musculoskeletal: Small Schmorl's node in the superior endplate of T8 is noted. No fracture. No lytic or sclerotic lesion. CT ABDOMEN PELVIS FINDINGS Hepatobiliary: No focal liver abnormality is seen. No gallstones, gallbladder wall thickening, or biliary dilatation. Pancreas: Unremarkable. No pancreatic ductal dilatation or surrounding inflammatory changes. Spleen: Normal in size without focal abnormality. Adrenals/Urinary Tract: Adrenal glands are unremarkable. Kidneys are normal, without renal calculi, focal lesion, or hydronephrosis. Bladder is unremarkable. Stomach/Bowel: Hartmann's pouch is identified. Colostomy in the left mid abdomen is identified. The colon is otherwise unremarkable. The appendix is not visualized but no evidence of appendicitis is seen. The small bowel appears normal. Feeding tube is in place in the stomach. Vascular/Lymphatic: Aortic atherosclerosis. No enlarged abdominal or pelvic lymph nodes. Reproductive: Prostate is unremarkable. Moderately large left hydrocele is chronic and seen on the 2016 exam. Other: The patient has a large left inguinal hernia containing loops of small and large bowel without complicating feature. The hernia extends into the scrotum. The appearance is unchanged since the prior CT. Musculoskeletal: No lytic or sclerotic lesion. Degenerative disc disease L5-S1 noted. IMPRESSION: Bilateral lower lobe pneumonia is worse on the left. Peribronchial thickening consistent with bronchitis is also identified. No other acute finding chest, abdomen or pelvis. Calcific aortic and coronary atherosclerosis. Emphysema. Large left inguinal hernia containing loops of small bowel extends into the scrotum. No bowel  obstruction or other complicating feature. Moderate left hydrocele. 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 No complicating feature. Electronically Signed   By: Inge Rise M.D.   On: 03/10/2017 01:54   Dg Chest 2 View  Result Date: 02/15/2017 CLINICAL DATA:  Fever and cough EXAM: CHEST  2 VIEW COMPARISON:  June 20, 2016 FINDINGS: There is airspace consolidation in the left lower lobe consistent with pneumonia. Lungs elsewhere clear. Heart is upper normal in size with pulmonary vascularity within normal limits. There is aortic atherosclerosis. No adenopathy. No bone lesions. IMPRESSION: Left base airspace consolidation consistent with pneumonia. Lungs elsewhere clear. Heart upper normal in size. There is aortic atherosclerosis. Aortic Atherosclerosis (ICD10-I70.0). Electronically Signed   By: Lowella Grip III M.D.   On: 02/15/2017 10:41   Ct Chest Wo Contrast  Result  Date: 03/10/2017 CLINICAL DATA:  Fever today.  Scrotal swelling. EXAM: CT CHEST, ABDOMEN, AND PELVIS WITHOUT AND CT ABDOMEN AND PELVIS WITH CONTRAST TECHNIQUE: Multidetector CT imaging of the chest, abdomen and pelvis was performed following the standard protocol before bolus administration of intravenous contrast. CT abdomen and pelvis was then performed after bolus administration of intravenous contrast. CONTRAST:  100 ml ISOVUE-300 IOPAMIDOL (ISOVUE-300) INJECTION 61% COMPARISON:  Single-view of the chest 03/09/2017. CT abdomen and pelvis 09/01/2014. CT chest 05/01/2013. FINDINGS: CT CHEST FINDINGS Cardiovascular: Heart size is normal. No pleural or pericardial effusion. Aortic atherosclerosis noted. No aneurysm. Mediastinum/Nodes: 1.5 cm left precarinal node noted. No hiatal hernia. Thyroid appears normal. Lungs/Pleura: No pleural effusion. The lungs demonstrate emphysematous disease. Bilateral lower lobe airspace disease is worse on the left. Peribronchial thickening is seen  in the lower lobes bilaterally. No nodule or mass. Musculoskeletal: Small Schmorl's node in the superior endplate of T8 is noted. No fracture. No lytic or sclerotic lesion. CT ABDOMEN PELVIS FINDINGS Hepatobiliary: No focal liver abnormality is seen. No gallstones, gallbladder wall thickening, or biliary dilatation. Pancreas: Unremarkable. No pancreatic ductal dilatation or surrounding inflammatory changes. Spleen: Normal in size without focal abnormality. Adrenals/Urinary Tract: Adrenal glands are unremarkable. Kidneys are normal, without renal calculi, focal lesion, or hydronephrosis. Bladder is unremarkable. Stomach/Bowel: Hartmann's pouch is identified. Colostomy in the left mid abdomen is identified. The colon is otherwise unremarkable. The appendix is not visualized but no evidence of appendicitis is seen. The small bowel appears normal. Feeding tube is in place in the stomach. Vascular/Lymphatic: Aortic atherosclerosis. No enlarged abdominal or pelvic lymph nodes. Reproductive: Prostate is unremarkable. Moderately large left hydrocele is chronic and seen on the 2016 exam. Other: The patient has a large left inguinal hernia containing loops of small and large bowel without complicating feature. The hernia extends into the scrotum. The appearance is unchanged since the prior CT. Musculoskeletal: No lytic or sclerotic lesion. Degenerative disc disease L5-S1 noted. IMPRESSION: Bilateral lower lobe pneumonia is worse on the left. Peribronchial thickening consistent with bronchitis is also identified. No other acute finding chest, abdomen or pelvis. Calcific aortic and coronary atherosclerosis. Emphysema. Large left inguinal hernia containing loops of small bowel extends into the scrotum. No bowel obstruction or other complicating feature. Moderate left hydrocele. 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 No complicating feature. Electronically Signed   By:  Inge Rise M.D.   On: 03/10/2017 01:54   Dg Chest Portable 1 View  Result Date: 03/09/2017 CLINICAL DATA:  Low blood pressure with fever and cough EXAM: PORTABLE CHEST 1 VIEW COMPARISON:  02/15/2017, 06/20/2016 FINDINGS: Persistent airspace disease at the left lung base. Streaky atelectasis at the right base. No large effusion. Mild cardiomegaly with slight central vascular congestion. Aortic atherosclerosis. No pneumothorax. IMPRESSION: 1. Persistent airspace disease at the left base suspicious for pneumonia. Possible tiny left effusion 2. Mild cardiomegaly with central vascular congestion Electronically Signed   By: Donavan Foil M.D.   On: 03/09/2017 21:11    Orson Eva, DO  [facility] Pager [phone]  If 7PM-7AM, please contact night-coverage [email or web address] Password [email or web address] 03/10/2017, 9:42 AM   LOS: 0 days

## 2017-03-10 NOTE — H&P (Signed)
History and Physical    Steven David ZOX:[id number] DOB: 03-Aug-1955 DOA: 03/09/2017  PCP: Hilbert Corrigan, MD   Patient coming from: SNF  Chief Complaint: Fever and hypotension  HPI: Steven David is a 62 y.o. 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  The patient is a level 5 caveat due to his dysarthria and general inability to give a good history.  He appears to deny any significant pain at this time, but no other significant history could be obtained.   ED Course: His vital signs are stable, but he is slightly tachycardic and he is noted to be febrile with a temperature of 100.9 F.  He is having some chills and Reiger's and initial lactic acid was 0.49 and this subsequently increased to 2.46.  He was given a 30 cc/kg fluid bolus and started on Zosyn and vancomycin.  Chest x-ray demonstrates possible left-sided pneumonia and urine analysis is negative for any acute findings.  CT of the chest along with abdomen obtain later demonstrates what appears to be bilateral pneumonia left> right with no acute findings in the abdomen. Influenza swab is negative and blood cultures have been obtained.  He is noted to be thrombocytopenic with platelet count of 74,000, but this appears to be his usual baseline.  Review of Systems: Cannot be obtained due to patient condition.  Past Medical History:  Diagnosis Date  . Aggressive outburst   . Cognitive communication deficit   . Colostomy in place [facility])   . Drug-induced Parkinsonism ([place])   . Dysphagia   . Dysphagia   . Hard of hearing    Wears bilateral hearing aides  . Hernia   . High cholesterol   . HOH (hard of hearing)   . Malnutrition ([place])     . Malnutrition ([place])   . Schizoaffective disorder ([place])   . Schizophrenia ([place])   . Seizures ([place])   . Thyroid disease   . TIA (transient ischemic attack)     Past Surgical History:  Procedure Laterality Date  . ESOPHAGOGASTRODUODENOSCOPY (EGD) WITH PROPOFOL N/A 09/08/2014   Procedure: ESOPHAGOGASTRODUODENOSCOPY (EGD) WITH PROPOFOL;  Surgeon: Georganna Skeans, MD;  Location: [place];  Service: Endoscopy;  Laterality: N/A;  . LAPAROTOMY N/A 09/01/2014   Procedure: EXPLORATORY LAPAROTOMY, SIGMOID COLECTOMY, SIGMOID COLOSTOMY, PRIMARY REPAIR OF STRANGULATED INGUINAL HERNIA.;  Surgeon: Rolm Bookbinder, MD;  Location: [facility];  Service: General;  Laterality: N/A;  . LEG SURGERY    . PEG PLACEMENT N/A 09/08/2014   Procedure: PERCUTANEOUS ENDOSCOPIC GASTROSTOMY (PEG) PLACEMENT;  Surgeon: Georganna Skeans, MD;  Location: [place];  Service: Endoscopy;  Laterality: N/A;  . PEG PLACEMENT N/A 06/30/2015   Procedure: PERCUTANEOUS ENDOSCOPIC GASTROSTOMY (PEG) REPLACEMENT;  Surgeon: Daneil Dolin, MD;  [facility];  Service: Endoscopy;  Laterality: N/A;  1100  . PEG PLACEMENT N/A 12/30/2015   Procedure: PERCUTANEOUS ENDOSCOPIC GASTROSTOMY (PEG) REPLACEMENT;  Surgeon: Daneil Dolin, MD;  [facility];  Service: Endoscopy;  Laterality: N/A;  . PEG PLACEMENT N/A 08/24/2016   Procedure: PERCUTANEOUS ENDOSCOPIC GASTROSTOMY (PEG) REPLACEMENT;  Surgeon: Daneil Dolin, MD;  [facility];  Service: Endoscopy;  Laterality: N/A;  . TESTICLE SURGERY       reports that he has  been smoking.  he has never used smokeless tobacco. He reports that he does not drink alcohol or use drugs.  No Known Allergies  Family History  Family history unknown: Yes    Prior to Admission medications   Medication Sig Start Date End Date Taking? Authorizing Provider  acetaminophen (TYLENOL) 325 MG tablet Place 650 mg into feeding tube every 6 (six) hours as needed for moderate pain or  fever.    [provider]  aspirin 325 MG tablet Take 325 mg by mouth daily.    [provider]  cholecalciferol (VITAMIN D) 1000 units tablet Take 1,000 Units by mouth daily.    [provider]  divalproex (DEPAKOTE SPRINKLE) 125 MG capsule Take 625 mg by mouth 2 (two) times daily.    [provider]  ferrous sulfate 325 (65 FE) MG tablet 325 mg daily with breakfast. Per tube    [provider]  furosemide (LASIX) 20 MG tablet Place 10 mg into feeding tube 2 (two) times daily.     [provider]  guaifenesin (HUMIBID E) 400 MG TABS tablet Take 400 mg by mouth 2 (two) times daily. Per tube     [provider]  haloperidol (HALDOL) 10 MG tablet Take one (1) tablet by mouth at bedtime 11/14/16 03/13/17  [provider]  haloperidol decanoate (HALDOL DECANOATE) 50 MG/ML injection Inject 100 mg into the muscle every 28 (twenty-eight) days.    [provider]  levETIRAcetam (KEPPRA) 250 MG tablet Take 250 mg by mouth 2 (two) times daily. Per tube    [provider]  levothyroxine (SYNTHROID, LEVOTHROID) 100 MCG tablet Take 100 mcg by mouth daily before breakfast.    [provider]  loratadine (CLARITIN) 10 MG tablet Place 1 tablet (10 mg total) into feeding tube daily. 09/10/14   Nat Christen, PA-C  lovastatin (MEVACOR) 20 MG tablet Place 1 tablet (20 mg total) into feeding tube daily. 09/10/14   Nat Christen, PA-C  Nutritional Supplements (FEEDING SUPPLEMENT, JEVITY 1.5 CAL/FIBER,) LIQD Place 1,000 mLs into feeding tube continuous. Initiate nocturnal feedings of Jevity 1.5 @ 100 ml/hr via PEG over 14 hour period 09/10/14   Nat Christen, PA-C  OLANZapine zydis (ZYPREXA) 5 MG disintegrating tablet Take 5 mg by mouth at bedtime.    [provider]  oxybutynin (DITROPAN) 5 MG tablet Take 5 mg by mouth 2 (two) times daily.    [provider]  polyethylene glycol (MIRALAX / GLYCOLAX) packet Take  17 g by mouth daily.    [provider]  vitamin C (ASCORBIC ACID) 500 MG tablet Take 500 mg by mouth 2 (two) times daily.     [provider]    Physical Exam: Vitals:   03/10/17 0000 03/10/17 0030 03/10/17 0100 03/10/17 0130  BP: 120/67 111/60 (!) 110/57 122/64  Pulse: 88 91 94 (!) 102  Resp: (!) 24 (!) 23 (!) 21 (!) 24  Temp:      TempSrc:      SpO2: 97% 96% 93% 95%  Weight:      Height:        Constitutional: NAD, chills with rigors. Vitals:   03/10/17 0000 03/10/17 0030 03/10/17 0100 03/10/17 0130  BP: 120/67 111/60 (!) 110/57 122/64  Pulse: 88 91 94 (!) 102  Resp: (!) 24 (!) 23 (!) 21 (!) 24  Temp:      TempSrc:      SpO2: 97% 96% 93% 95%  Weight:  Height:       Eyes: lids and conjunctivae normal ENMT: Mucous membranes are dry. Neck: normal, supple Respiratory: clear to auscultation bilaterally. Normal respiratory effort. No accessory muscle use.  Cardiovascular: Regular rate and rhythm, no murmurs. No extremity edema. Abdomen: no tenderness, no distention. Bowel sounds positive. Large L inguinal hernia. Colostomy c/d/i with brown stool; PEG tube c/d/i. Musculoskeletal:  No joint deformity upper and lower extremities.   Skin: no rashes, lesions, ulcers.   Labs on Admission: I have personally reviewed following labs and imaging studies  CBC: Recent Labs  Lab 03/09/17 2155  WBC 8.4  NEUTROABS 6.4  HGB 12.5*  HCT 40.5  MCV 90.6  PLT 74*   Basic Metabolic Panel: Recent Labs  Lab 03/09/17 2155  NA 139  K 4.1  CL 100*  CO2 26  GLUCOSE 77  BUN 22*  CREATININE 1.00  CALCIUM 9.3   GFR: Estimated Creatinine Clearance: 73.1 mL/min (by C-G formula based on SCr of 1 mg/dL). Liver Function Tests: Recent Labs  Lab 03/09/17 2155  AST 20  ALT 13*  ALKPHOS 68  BILITOT 0.2*  PROT 8.8*  ALBUMIN 4.0   No results for input(s): LIPASE, AMYLASE in the last 168 hours. No results for input(s): AMMONIA in the last 168 hours. Coagulation  Profile: No results for input(s): INR, PROTIME in the last 168 hours. Cardiac Enzymes: No results for input(s): CKTOTAL, CKMB, CKMBINDEX, TROPONINI in the last 168 hours. BNP (last 3 results) No results for input(s): PROBNP in the last 8760 hours. HbA1C: No results for input(s): HGBA1C in the last 72 hours. CBG: No results for input(s): GLUCAP in the last 168 hours. Lipid Profile: No results for input(s): CHOL, HDL, LDLCALC, TRIG, CHOLHDL, LDLDIRECT in the last 72 hours. Thyroid Function Tests: No results for input(s): TSH, T4TOTAL, FREET4, T3FREE, THYROIDAB in the last 72 hours. Anemia Panel: No results for input(s): VITAMINB12, FOLATE, FERRITIN, TIBC, IRON, RETICCTPCT in the last 72 hours. Urine analysis:    Component Value Date/Time   COLORURINE YELLOW 03/09/2017 2204   APPEARANCEUR CLEAR 03/09/2017 2204   LABSPEC 1.017 03/09/2017 2204   PHURINE 6.0 03/09/2017 2204   GLUCOSEU NEGATIVE 03/09/2017 2204   HGBUR NEGATIVE 03/09/2017 2204   BILIRUBINUR NEGATIVE 03/09/2017 2204   KETONESUR NEGATIVE 03/09/2017 2204   PROTEINUR NEGATIVE 03/09/2017 2204   UROBILINOGEN 1.0 11/19/2014 0152   NITRITE NEGATIVE 03/09/2017 2204   LEUKOCYTESUR NEGATIVE 03/09/2017 2204    Radiological Exams on Admission: Ct Abdomen Pelvis W Wo Contrast  Result Date: 03/10/2017 CLINICAL DATA:  Fever today.  Scrotal swelling. EXAM: CT CHEST, ABDOMEN, AND PELVIS WITHOUT AND CT ABDOMEN AND PELVIS WITH CONTRAST TECHNIQUE: Multidetector CT imaging of the chest, abdomen and pelvis was performed following the standard protocol before bolus administration of intravenous contrast. CT abdomen and pelvis was then performed after bolus administration of intravenous contrast. CONTRAST:  100 ml ISOVUE-300 IOPAMIDOL (ISOVUE-300) INJECTION 61% COMPARISON:  Single-view of the chest 03/09/2017. CT abdomen and pelvis 09/01/2014. CT chest 05/01/2013. FINDINGS: CT CHEST FINDINGS Cardiovascular: Heart size is normal. No pleural or  pericardial effusion. Aortic atherosclerosis noted. No aneurysm. Mediastinum/Nodes: 1.5 cm left precarinal node noted. No hiatal hernia. Thyroid appears normal. Lungs/Pleura: No pleural effusion. The lungs demonstrate emphysematous disease. Bilateral lower lobe airspace disease is worse on the left. Peribronchial thickening is seen in the lower lobes bilaterally. No nodule or mass. Musculoskeletal: Small Schmorl's node in the superior endplate of T8 is noted. No fracture. No lytic or sclerotic lesion. CT  ABDOMEN PELVIS FINDINGS Hepatobiliary: No focal liver abnormality is seen. No gallstones, gallbladder wall thickening, or biliary dilatation. Pancreas: Unremarkable. No pancreatic ductal dilatation or surrounding inflammatory changes. Spleen: Normal in size without focal abnormality. Adrenals/Urinary Tract: Adrenal glands are unremarkable. Kidneys are normal, without renal calculi, focal lesion, or hydronephrosis. Bladder is unremarkable. Stomach/Bowel: Hartmann's pouch is identified. Colostomy in the left mid abdomen is identified. The colon is otherwise unremarkable. The appendix is not visualized but no evidence of appendicitis is seen. The small bowel appears normal. Feeding tube is in place in the stomach. Vascular/Lymphatic: Aortic atherosclerosis. No enlarged abdominal or pelvic lymph nodes. Reproductive: Prostate is unremarkable. Moderately large left hydrocele is chronic and seen on the 2016 exam. Other: The patient has a large left inguinal hernia containing loops of small and large bowel without complicating feature. The hernia extends into the scrotum. The appearance is unchanged since the prior CT. Musculoskeletal: No lytic or sclerotic lesion. Degenerative disc disease L5-S1 noted. IMPRESSION: Bilateral lower lobe pneumonia is worse on the left. Peribronchial thickening consistent with bronchitis is also identified. No other acute finding chest, abdomen or pelvis. Calcific aortic and coronary  atherosclerosis. Emphysema. Large left inguinal hernia containing loops of small bowel extends into the scrotum. No bowel obstruction or other complicating feature. Moderate left hydrocele. 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 No complicating feature. Electronically Signed   By: Inge Rise M.D.   On: 03/10/2017 01:54   Ct Chest Wo Contrast  Result Date: 03/10/2017 CLINICAL DATA:  Fever today.  Scrotal swelling. EXAM: CT CHEST, ABDOMEN, AND PELVIS WITHOUT AND CT ABDOMEN AND PELVIS WITH CONTRAST TECHNIQUE: Multidetector CT imaging of the chest, abdomen and pelvis was performed following the standard protocol before bolus administration of intravenous contrast. CT abdomen and pelvis was then performed after bolus administration of intravenous contrast. CONTRAST:  100 ml ISOVUE-300 IOPAMIDOL (ISOVUE-300) INJECTION 61% COMPARISON:  Single-view of the chest 03/09/2017. CT abdomen and pelvis 09/01/2014. CT chest 05/01/2013. FINDINGS: CT CHEST FINDINGS Cardiovascular: Heart size is normal. No pleural or pericardial effusion. Aortic atherosclerosis noted. No aneurysm. Mediastinum/Nodes: 1.5 cm left precarinal node noted. No hiatal hernia. Thyroid appears normal. Lungs/Pleura: No pleural effusion. The lungs demonstrate emphysematous disease. Bilateral lower lobe airspace disease is worse on the left. Peribronchial thickening is seen in the lower lobes bilaterally. No nodule or mass. Musculoskeletal: Small Schmorl's node in the superior endplate of T8 is noted. No fracture. No lytic or sclerotic lesion. CT ABDOMEN PELVIS FINDINGS Hepatobiliary: No focal liver abnormality is seen. No gallstones, gallbladder wall thickening, or biliary dilatation. Pancreas: Unremarkable. No pancreatic ductal dilatation or surrounding inflammatory changes. Spleen: Normal in size without focal abnormality. Adrenals/Urinary Tract: Adrenal glands are unremarkable. Kidneys are  normal, without renal calculi, focal lesion, or hydronephrosis. Bladder is unremarkable. Stomach/Bowel: Hartmann's pouch is identified. Colostomy in the left mid abdomen is identified. The colon is otherwise unremarkable. The appendix is not visualized but no evidence of appendicitis is seen. The small bowel appears normal. Feeding tube is in place in the stomach. Vascular/Lymphatic: Aortic atherosclerosis. No enlarged abdominal or pelvic lymph nodes. Reproductive: Prostate is unremarkable. Moderately large left hydrocele is chronic and seen on the 2016 exam. Other: The patient has a large left inguinal hernia containing loops of small and large bowel without complicating feature. The hernia extends into the scrotum. The appearance is unchanged since the prior CT. Musculoskeletal: No lytic or sclerotic lesion. Degenerative disc disease L5-S1 noted. IMPRESSION:  Bilateral lower lobe pneumonia is worse on the left. Peribronchial thickening consistent with bronchitis is also identified. No other acute finding chest, abdomen or pelvis. Calcific aortic and coronary atherosclerosis. Emphysema. Large left inguinal hernia containing loops of small bowel extends into the scrotum. No bowel obstruction or other complicating feature. Moderate left hydrocele. 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 No complicating feature. Electronically Signed   By: Inge Rise M.D.   On: 03/10/2017 01:54   Dg Chest Portable 1 View  Result Date: 03/09/2017 CLINICAL DATA:  Low blood pressure with fever and cough EXAM: PORTABLE CHEST 1 VIEW COMPARISON:  02/15/2017, 06/20/2016 FINDINGS: Persistent airspace disease at the left lung base. Streaky atelectasis at the right base. No large effusion. Mild cardiomegaly with slight central vascular congestion. Aortic atherosclerosis. No pneumothorax. IMPRESSION: 1. Persistent airspace disease at the left base suspicious for pneumonia.  Possible tiny left effusion 2. Mild cardiomegaly with central vascular congestion Electronically Signed   By: Donavan Foil M.D.   On: 03/09/2017 21:11    Assessment/Plan Principal Problem:   Sepsis ([place]) Active Problems:   Hypothyroidism   Pneumonia   Thrombocytopenia ([place])   Schizophrenia ([place])   Seizure ([place])   Left inguinal hernia    1. Sepsis secondary to bilateral lower lobe pneumonia.  Continue aggressive IV fluid hydration follow lactic acid level.  Blood cultures obtained in ED.  Continue on vancomycin and Zosyn.  Influenza swab negative.  Monitor in stepdown unit overnight and could be transferred to floor in a.m. if blood pressures remain stable.  Obtain urine Legionella, and strep pneumonia.  Pro-calcitonin per sepsis protocol.  MRSA nares screen. 2. Hypothyroidism.  Continue Synthroid. 3. Schizophrenia.  Continue home medications 4. Seizure history.  Continue Keppra. 5. Dysphasia.  Continue on mechanical soft diet as well as PEG tube feeds as noted on previous admission. 6. Thrombocytopenia.  Currently stable.  Continue to monitor on repeat labs and maintain on SCDs.   DVT prophylaxis: SCDs Code Status: DNR Family Communication: None at bedside Disposition Plan:Empiric sepsis treatment initiated Consults called:None Admission status: Inpatient, SDU   Addelynn Batte Darleen Crocker DO [facility] Pager [phone]  If 7PM-7AM, please contact night-coverage [email or web address] Password [email or web address]  03/10/2017, 2:18 AM

## 2017-03-10 NOTE — ED Notes (Signed)
Per Dr. Manuella Ghazi, pt is allowed to drink contrast for ct scan.

## 2017-03-11 LAB — CBC
HCT: 33.6 % — ABNORMAL LOW (ref 39.0–52.0)
Hemoglobin: 10.1 g/dL — ABNORMAL LOW (ref 13.0–17.0)
MCH: 27.5 pg (ref 26.0–34.0)
MCHC: 30.1 g/dL (ref 30.0–36.0)
MCV: 91.6 fL (ref 78.0–100.0)
Platelets: 60 10*3/uL — ABNORMAL LOW (ref 150–400)
RBC: 3.67 MIL/uL — AB (ref 4.22–5.81)
RDW: 16.7 % — ABNORMAL HIGH (ref 11.5–15.5)
WBC: 4.5 10*3/uL (ref 4.0–10.5)

## 2017-03-11 LAB — BASIC METABOLIC PANEL
Anion gap: 8 (ref 5–15)
BUN: 14 mg/dL (ref 6–20)
CHLORIDE: 109 mmol/L (ref 101–111)
CO2: 25 mmol/L (ref 22–32)
CREATININE: 0.98 mg/dL (ref 0.61–1.24)
Calcium: 7.8 mg/dL — ABNORMAL LOW (ref 8.9–10.3)
GFR calc non Af Amer: >60 mL/min (ref >60)
GLUCOSE: 126 mg/dL — AB (ref 65–99)
Potassium: 3.8 mmol/L (ref 3.5–5.1)
Sodium: 142 mmol/L (ref 135–145)

## 2017-03-11 LAB — MAGNESIUM: Magnesium: 1.7 mg/dL (ref 1.7–2.4)

## 2017-03-11 LAB — PROCALCITONIN: Procalcitonin: 0.39 ng/mL

## 2017-03-11 LAB — GLUCOSE, CAPILLARY: Glucose-Capillary: 105 mg/dL — ABNORMAL HIGH (ref 65–99)

## 2017-03-11 MED ORDER — POTASSIUM CHLORIDE IN NACL 20-0.9 MEQ/L-% IV SOLN
INTRAVENOUS | Status: AC
  Administered 2017-03-11 – 2017-03-12 (×2): via INTRAVENOUS

## 2017-03-11 MED ORDER — ORAL CARE MOUTH RINSE
15.0000 mL | Freq: Two times a day (BID) | OROMUCOSAL | Status: DC
  Administered 2017-03-11 – 2017-03-15 (×9): 15 mL via OROMUCOSAL

## 2017-03-11 MED ORDER — ACETAMINOPHEN 160 MG/5ML PO SOLN
650.0000 mg | Freq: Four times a day (QID) | ORAL | Status: DC | PRN
  Administered 2017-03-11 – 2017-03-12 (×4): 650 mg via ORAL
  Filled 2017-03-11 (×4): qty 20.3

## 2017-03-11 NOTE — Evaluation (Signed)
Clinical/Bedside Swallow Evaluation Patient Details  Name: Steven David MRN: [id number] Date of Birth: Mar 25, 1955  Today's Date: 03/11/2017 Time: SLP Start Time (ACUTE ONLY): 51 SLP Stop Time (ACUTE ONLY): 1305 SLP Time Calculation (min) (ACUTE ONLY): 34 min  Past Medical History:  Past Medical History:  Diagnosis Date  . Aggressive outburst   . Cognitive communication deficit   . Colostomy in place [facility])   . Drug-induced Parkinsonism ([place])   . Dysphagia   . Dysphagia   . Hard of hearing    Wears bilateral hearing aides  . Hernia   . High cholesterol   . HOH (hard of hearing)   . Malnutrition ([place])   . Malnutrition ([place])   . Schizoaffective disorder ([place])   . Schizophrenia ([place])   . Seizures ([place])   . Thyroid disease   . TIA (transient ischemic attack)    Past Surgical History:  Past Surgical History:  Procedure Laterality Date  . ESOPHAGOGASTRODUODENOSCOPY (EGD) WITH PROPOFOL N/A 09/08/2014   Procedure: ESOPHAGOGASTRODUODENOSCOPY (EGD) WITH PROPOFOL;  Surgeon: Georganna Skeans, MD;  Location: [place];  Service: Endoscopy;  Laterality: N/A;  . LAPAROTOMY N/A 09/01/2014   Procedure: EXPLORATORY LAPAROTOMY, SIGMOID COLECTOMY, SIGMOID COLOSTOMY, PRIMARY REPAIR OF STRANGULATED INGUINAL HERNIA.;  Surgeon: Rolm Bookbinder, MD;  Location: [place];  Service: General;  Laterality: N/A;  . LEG SURGERY    . PEG PLACEMENT N/A 09/08/2014   Procedure: PERCUTANEOUS ENDOSCOPIC GASTROSTOMY (PEG) PLACEMENT;  Surgeon: Georganna Skeans, MD;  Location: [place];  Service: Endoscopy;  Laterality: N/A;  . PEG PLACEMENT N/A 06/30/2015   Procedure: PERCUTANEOUS ENDOSCOPIC GASTROSTOMY (PEG) REPLACEMENT;  Surgeon: Daneil Dolin, MD;  [facility];  Service: Endoscopy;  Laterality: N/A;  1100  . PEG PLACEMENT N/A 12/30/2015   Procedure: PERCUTANEOUS ENDOSCOPIC GASTROSTOMY (PEG) REPLACEMENT;  Surgeon: Daneil Dolin, MD;  [facility];  Service: Endoscopy;  Laterality: N/A;   . PEG PLACEMENT N/A 08/24/2016   Procedure: PERCUTANEOUS ENDOSCOPIC GASTROSTOMY (PEG) REPLACEMENT;  Surgeon: Daneil Dolin, MD;  [facility];  Service: Endoscopy;  Laterality: N/A;  . TESTICLE SURGERY     HPI:  62 year old male with a history of dysarthria, dysphasia, dysphagia, schizoaffective disorder, seizure disorder, hyperlipidemia, hypothyroidism presenting from his nursing facility with fever and low blood pressure.  The patient is a very difficult historian secondary to his severe dysarthria.  Furthermore, he is a poor historian at baseline.  The patient was recently admitted to the hospital from 02/15/17 through 02/16/17 for HCAP.  He was discharged with Amox/clav.  According to the medical records, the patient is on mechanical soft diet with thin liquids at his nursing facility.  His gastrostomy tube is used only for medications.  The review of his medical records, it appears that the patient has severe dysphagia and was previously recommended to be n.p.o. status with all feeding/medications to be going through PEG tube.  Patient continued to eat and drink.  He accepted the risks of aspiration and subsequent pneumonia.  This was addressed again with the patient during his last hospita admission 1 month ago, and the patient wanted to continue eating despite the risks of aspiration.  Upon presentation, the patient had a fever up to 102.0 F and soft blood pressures with tachycardia.  The patient was started on vancomycin and Zosyn. BSE requested.    Assessment / Plan / Recommendation Clinical Impression  Clinical swallow evaluation completed at bedside. Pt known to this SLP from previous modified barium swallow  studies starting in 2015 demonstrating severe oropharyngeal phase dysphagia. Pt last had MBSS at [facility] on 07/25/16 with recommendation for NPO due to severity of oropharyngeal dysphagia and gross aspiration and pharyngeal residuals. Pt had a PEG placed in August 2016 and has  been using it for night feeds only. He has a brother and a sister who both live some distance away. He does not have anyone to help him make decisions regarding his health. This is Pt's second admission for aspiration PNA in the past month. He consumes a soft diet with thin liquids at [facility] with known risk of aspiration. RN reported that Pt appeared to choke this AM when consuming scrambled eggs (turned purple and silent cough with desaturation).   SLP attempted to speak with Pt regarding his understanding of his swallowing difficulties. When SLP stated: "Food and liquids are going into your lungs when you eat by mouth and can make you sick and die. Do you want to continue to eat by mouth?" Pt stated "No" and when asked if he would then be willing to use the PEG for all nutrition, he stated that he would be willing to only use the PEG. Pt then asked if he could still have ice cream and macaroni and cheese. He does not appear to fully understand his choices and consequences.   SLP then went on to complete a clinical swallow evaluation and assessed Pt with liquids (thin, NTL, HTL), puree, and D2 (finely chopped presented with mashed potatoes and gravy). Pt continues to present with suspected severe oropharyngeal phase dysphagia (lingual pumping, oral holding, delay in swallow initiation, and desaturation post swallow, and overt coughing/gurgling with thin liquids). SLP then wrote down and explained three options for Pt: 1) All food in tube and nothing by mouth, Risk: still aspirate oral secretions 2) Soft foods, all liquids, PEG feedings at night, Risk: food/liquid in lungs- coughing, PNA 3) Pureed/finely chopped, NTL, PEG at night, Risk: food/liquids in lungs but likely less- PNA  Pt verbally stated that he wanted "Number 3" and was agreeable to NTL over thin liquids when he was overtly coughing and gurgling with thin. D/W MD and RN and will change diet to D2(finely chopped with gravies) and NTL. SLP will  follow during acute stay.   SLP Visit Diagnosis: Dysphagia, oropharyngeal phase (R13.12)    Aspiration Risk  Severe aspiration risk;Risk for inadequate nutrition/hydration    Diet Recommendation Dysphagia 2 (Fine chop);Nectar-thick liquid   Liquid Administration via: Straw Medication Administration: Crushed with puree Supervision: Staff to assist with self feeding;Full supervision/cueing for compensatory strategies Compensations: Slow rate;Clear throat intermittently Postural Changes: Seated upright at 90 degrees;Remain upright for at least 30 minutes after po intake    Other  Recommendations Oral Care Recommendations: Oral care before and after PO;Staff/trained caregiver to provide oral care Other Recommendations: Order thickener from pharmacy;Prohibited food (jello, ice cream, thin soups);Clarify dietary restrictions   Follow up Recommendations 24 hour supervision/assistance      Frequency and Duration min 2x/week  1 week       Prognosis Prognosis for Safe Diet Advancement: Guarded Barriers to Reach Goals: Cognitive deficits;Medication      Swallow Study   General Date of Onset: 03/09/17 HPI: 62 year old male with a history of dysarthria, dysphasia, dysphagia, schizoaffective disorder, seizure disorder, hyperlipidemia, hypothyroidism presenting from his nursing facility with fever and low blood pressure.  The patient is a very difficult historian secondary to his severe dysarthria.  Furthermore, he is a poor historian at baseline.  The patient was recently admitted to the hospital from 02/15/17 through 02/16/17 for HCAP.  He was discharged with Amox/clav.  According to the medical records, the patient is on mechanical soft diet with thin liquids at his nursing facility.  His gastrostomy tube is used only for medications.  The review of his medical records, it appears that the patient has severe dysphagia and was previously recommended to be n.p.o. status with all feeding/medications to  be going through PEG tube.  Patient continued to eat and drink.  He accepted the risks of aspiration and subsequent pneumonia.  This was addressed again with the patient during his last hospita admission 1 month ago, and the patient wanted to continue eating despite the risks of aspiration.  Upon presentation, the patient had a fever up to 102.0 F and soft blood pressures with tachycardia.  The patient was started on vancomycin and Zosyn. BSE requested.  Type of Study: Bedside Swallow Evaluation Previous Swallow Assessment: MBSS: 07/25/16 NPO, 09/30/14 NPO, 06/03/13 NPO Diet Prior to this Study: Dysphagia 3 (soft);Thin liquids Temperature Spikes Noted: Yes Respiratory Status: Nasal cannula History of Recent Intubation: No Behavior/Cognition: Alert;Cooperative;Pleasant mood;Requires cueing Oral Cavity Assessment: Within Functional Limits Oral Care Completed by SLP: Yes Oral Cavity - Dentition: Edentulous Vision: Impaired for self-feeding Self-Feeding Abilities: Total assist;Needs assist Patient Positioning: Upright in bed Baseline Vocal Quality: Normal Volitional Cough: Weak Volitional Swallow: Able to elicit    Oral/Motor/Sensory Function Overall Oral Motor/Sensory Function: Mild impairment [place]: Impaired right;Impaired left   Ice Chips Ice chips: Not tested   Thin Liquid Thin Liquid: Impaired Presentation: Straw;Self Fed Oral Phase Functional Implications: Oral holding Pharyngeal  Phase Impairments: Suspected delayed Swallow;Decreased hyoid-laryngeal movement;Wet Vocal Quality;Cough - Delayed Other Comments: (suspect some nasal regurgitation due to sound)    Nectar Thick Nectar Thick Liquid: Impaired Presentation: Self Fed;Straw Oral phase functional implications: Oral holding Pharyngeal Phase Impairments: Suspected delayed Swallow;Decreased hyoid-laryngeal movement   Honey Thick Honey Thick Liquid: Impaired Presentation: Straw Oral Phase Impairments: Impaired mastication Oral Phase  Functional Implications: Oral residue Pharyngeal Phase Impairments: Suspected delayed Swallow;Decreased hyoid-laryngeal movement;Multiple swallows   Puree Puree: Impaired Presentation: Spoon Oral Phase Impairments: Impaired mastication Oral Phase Functional Implications: Oral holding;Prolonged oral transit Pharyngeal Phase Impairments: Suspected delayed Swallow;Decreased hyoid-laryngeal movement;Multiple swallows;Change in Vital Signs   Solid   Thank you,  Genene Churn, CCC-SLP [phone]    Solid: (trialed D2) Presentation: Spoon        PORTER,DABNEY 03/11/2017,2:36 PM

## 2017-03-11 NOTE — Progress Notes (Signed)
PROGRESS NOTE  Steven David XHB:[id number] DOB: Nov 11, 1955 DOA: 03/09/2017 PCP: Hilbert Corrigan, MD  Brief History:  62 year old male with a history of dysarthria, dysphasia, dysphagia, schizoaffective disorder, seizure disorder, hyperlipidemia, hypothyroidism presenting from his nursing facility with fever and low blood pressure.  The patient is a very difficult historian secondary to his severe dysarthria.  Furthermore, he is a poor historian at baseline.  The patient was recently admitted to the hospital from 02/15/17 through 02/16/17 for HCAP.  He was discharged with Amox/clav.  According to the medical records, the patient is on mechanical soft diet with thin liquids at his nursing facility.  His gastrostomy tube is used only for medications.  The review of his medical records, it appears that the patient has severe dysphagia and was previously recommended to be n.p.o. status with all feeding/medications to be going through PEG tube. Patient continued to eat and drink. He accepted the risks of aspiration and subsequent pneumonia.  This was addressed again with the patient during his last hospital admission 1 month ago, and the patient wanted to continue eating despite the risks of aspiration.  Upon presentation, the patient had a fever up to 102.0 F and soft blood pressures with tachycardia.  The patient was started on vancomycin and Zosyn.     Assessment/Plan: sepsis -Secondary to aspiration pneumonia -Continue IV fluids--add KCl -Lactic acid peaked at 2.46>>> 0.7 -Procalcitonin 0.35>>>0.39 -Discontinue vancomycin -continue zosyn -blood cultures neg  Aspiration pneumonia -Continue Zosyn -Speech therapy evaluation appreciated -switch to puree with thin liquids--discussed with speech -Personally reviewed chest x-ray--bibasilar opacities left greater than right  -03/09/2017 CT chest--bilateral lower lobe airspace disease, left greater than right.  Peribronchial  thickening bilateral. -discussed with sister-->favoring "comfort" feedings  Thrombocytopenia -This has been chronic, likely due to medications -Monitor for signs of bleeding -A.m. CBC  Hypothyroidism -Continue Synthroid  Schizoaffective disorder -Continue Haldol, Zyprexa, Depakote   seizure disorder -Continue Keppra  Hyperlipidemia -Continue statin    Disposition Plan:   SNF 2-3 days Family Communication:  updated sister on phone--Total time spent 35 minutes.  Greater than 50% spent face to face counseling and coordinating care.    Consultants:  none  Code Status:   DNR  DVT Prophylaxis:  SCDs   Procedures: As Listed in Progress Note Above  Antibiotics: vanco 3/1>>>3/2 Zosyn 3/1>>>      Subjective: Patient continues to have coughing and choking with eating.  He denies any chest pain, abdominal pain, headache.  He has some shortness of breath.  Review systems very difficult secondary to the patient's cognitive impairment.  Objective: Vitals:   03/11/17 0400 03/11/17 0500 03/11/17 0600 03/11/17 0800  BP: 103/76 (!) 85/45 (!) 89/55   Pulse: 89 73 74   Resp: 13 17 16    Temp: (!) 97.5 F (36.4 C)   99.9 F (37.7 C)  TempSrc: Axillary   Axillary  SpO2: 92% 95% 97%   Weight:  68.9 kg (151 lb 14.4 oz)    Height:        Intake/Output Summary (Last 24 hours) at 03/11/2017 1348 Last data filed at 03/11/2017 0915 Gross per 24 hour  Intake 740 ml  Output 2000 ml  Net -1260 ml   Weight change: 3.128 kg (6 lb 14.4 oz) Exam:   General:  Pt is alert, follows commands appropriately, not in acute distress  HEENT: No icterus, No thrush, No neck mass, [place]/AT  Cardiovascular: RRR, S1/S2, no rubs,  no gallops  Respiratory: Bibasilar rales, left greater than right.  No wheezing.  Abdomen: Soft/+BS, non tender, non distended, no guarding  Extremities: No edema, No lymphangitis, No petechiae, No rashes, no synovitis   Data Reviewed: I have  personally reviewed following labs and imaging studies Basic Metabolic Panel: Recent Labs  Lab 03/09/17 2155 03/10/17 0427 03/11/17 0527  NA 139 140 142  K 4.1 4.0 3.8  CL 100* 107 109  CO2 26 24 25   GLUCOSE 77 92 126*  BUN 22* 15 14  CREATININE 1.00 0.86 0.98  CALCIUM 9.3 8.3* 7.8*  MG  --   --  1.7   Liver Function Tests: Recent Labs  Lab 03/09/17 2155 03/10/17 0427  AST 20 21  ALT 13* 10*  ALKPHOS 68 56  BILITOT 0.2* 0.4  PROT 8.8* 7.0  ALBUMIN 4.0 3.1*   No results for input(s): LIPASE, AMYLASE in the last 168 hours. No results for input(s): AMMONIA in the last 168 hours. Coagulation Profile: No results for input(s): INR, PROTIME in the last 168 hours. CBC: Recent Labs  Lab 03/09/17 2155 03/10/17 0427 03/11/17 0527  WBC 8.4 7.5 4.5  NEUTROABS 6.4  --   --   HGB 12.5* 11.4* 10.1*  HCT 40.5 36.5* 33.6*  MCV 90.6 89.9 91.6  PLT 74* 75* 60*   Cardiac Enzymes: No results for input(s): CKTOTAL, CKMB, CKMBINDEX, TROPONINI in the last 168 hours. BNP: Invalid input(s): POCBNP CBG: Recent Labs  Lab 03/10/17 0834 03/11/17 0740  GLUCAP 74 105*   HbA1C: No results for input(s): HGBA1C in the last 72 hours. Urine analysis:    Component Value Date/Time   COLORURINE YELLOW 03/09/2017 2204   APPEARANCEUR CLEAR 03/09/2017 2204   LABSPEC 1.017 03/09/2017 2204   PHURINE 6.0 03/09/2017 2204   GLUCOSEU NEGATIVE 03/09/2017 2204   HGBUR NEGATIVE 03/09/2017 [place] 03/09/2017 2204   KETONESUR NEGATIVE 03/09/2017 2204   PROTEINUR NEGATIVE 03/09/2017 2204   UROBILINOGEN 1.0 11/19/2014 0152   NITRITE NEGATIVE 03/09/2017 2204   LEUKOCYTESUR NEGATIVE 03/09/2017 2204   Sepsis Labs: @LABRCNTIP (procalcitonin:4,lacticidven:4) ) Recent Results (from the past 240 hour(s))  Blood Culture (routine x 2)     Status: None (Preliminary result)   Collection Time: 03/09/17  9:55 PM  Result Value Ref Range Status   Specimen Description RIGHT ANTECUBITAL   Final   Special Requests   Final    BOTTLES DRAWN AEROBIC AND ANAEROBIC Blood Culture adequate volume DRAWN BY RN   Culture   Final    NO GROWTH 2 DAYS Performed at [facility], [address] [place], [place] [address]    Report Status PENDING  Incomplete  Blood Culture (routine x 2)     Status: None (Preliminary result)   Collection Time: 03/09/17 10:05 PM  Result Value Ref Range Status   Specimen Description BLOOD LEFT HAND  Final   Special Requests   Final    BOTTLES DRAWN AEROBIC ONLY Blood Culture results may not be optimal due to an inadequate volume of blood received in culture bottles   Culture   Final    NO GROWTH 2 DAYS Performed at [facility], [address] [place], [place] [address]    Report Status PENDING  Incomplete     Scheduled Meds: . cholecalciferol  1,000 Units Oral Daily  . divalproex  625 mg Oral BID  . ferrous sulfate  325 mg Oral Q breakfast  . guaiFENesin  20 mL Oral BID  .  haloperidol  10 mg Oral QHS  . levETIRAcetam  250 mg Oral BID  . levothyroxine  100 mcg Oral QAC breakfast  . loratadine  10 mg Oral Daily  . OLANZapine zydis  5 mg Oral QHS  . oxybutynin  5 mg Oral BID  . polyethylene glycol  17 g Oral Daily  . pravastatin  10 mg Oral q1800  . vitamin C  500 mg Oral BID   Continuous Infusions: . piperacillin-tazobactam (ZOSYN)  IV 3.375 g (03/11/17 0605)  . vancomycin 500 mg (03/11/17 0915)    Procedures/Studies: Ct Abdomen Pelvis W Wo Contrast  Result Date: 03/10/2017 CLINICAL DATA:  Fever today.  Scrotal swelling. EXAM: CT CHEST, ABDOMEN, AND PELVIS WITHOUT AND CT ABDOMEN AND PELVIS WITH CONTRAST TECHNIQUE: Multidetector CT imaging of the chest, abdomen and pelvis was performed following the standard protocol before bolus administration of intravenous contrast. CT abdomen and pelvis was then performed after bolus administration of intravenous contrast. CONTRAST:  100 ml ISOVUE-300 IOPAMIDOL (ISOVUE-300) INJECTION 61% COMPARISON:   Single-view of the chest 03/09/2017. CT abdomen and pelvis 09/01/2014. CT chest 05/01/2013. FINDINGS: CT CHEST FINDINGS Cardiovascular: Heart size is normal. No pleural or pericardial effusion. Aortic atherosclerosis noted. No aneurysm. Mediastinum/Nodes: 1.5 cm left precarinal node noted. No hiatal hernia. Thyroid appears normal. Lungs/Pleura: No pleural effusion. The lungs demonstrate emphysematous disease. Bilateral lower lobe airspace disease is worse on the left. Peribronchial thickening is seen in the lower lobes bilaterally. No nodule or mass. Musculoskeletal: Small Schmorl's node in the superior endplate of T8 is noted. No fracture. No lytic or sclerotic lesion. CT ABDOMEN PELVIS FINDINGS Hepatobiliary: No focal liver abnormality is seen. No gallstones, gallbladder wall thickening, or biliary dilatation. Pancreas: Unremarkable. No pancreatic ductal dilatation or surrounding inflammatory changes. Spleen: Normal in size without focal abnormality. Adrenals/Urinary Tract: Adrenal glands are unremarkable. Kidneys are normal, without renal calculi, focal lesion, or hydronephrosis. Bladder is unremarkable. Stomach/Bowel: Hartmann's pouch is identified. Colostomy in the left mid abdomen is identified. The colon is otherwise unremarkable. The appendix is not visualized but no evidence of appendicitis is seen. The small bowel appears normal. Feeding tube is in place in the stomach. Vascular/Lymphatic: Aortic atherosclerosis. No enlarged abdominal or pelvic lymph nodes. Reproductive: Prostate is unremarkable. Moderately large left hydrocele is chronic and seen on the 2016 exam. Other: The patient has a large left inguinal hernia containing loops of small and large bowel without complicating feature. The hernia extends into the scrotum. The appearance is unchanged since the prior CT. Musculoskeletal: No lytic or sclerotic lesion. Degenerative disc disease L5-S1 noted. IMPRESSION: Bilateral lower lobe pneumonia is worse  on the left. Peribronchial thickening consistent with bronchitis is also identified. No other acute finding chest, abdomen or pelvis. Calcific aortic and coronary atherosclerosis. Emphysema. Large left inguinal hernia containing loops of small bowel extends into the scrotum. No bowel obstruction or other complicating feature. Moderate left hydrocele. 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 No complicating feature. Electronically Signed   By: Inge Rise M.D.   On: 03/10/2017 01:54   Dg Chest 2 View  Result Date: 02/15/2017 CLINICAL DATA:  Fever and cough EXAM: CHEST  2 VIEW COMPARISON:  June 20, 2016 FINDINGS: There is airspace consolidation in the left lower lobe consistent with pneumonia. Lungs elsewhere clear. Heart is upper normal in size with pulmonary vascularity within normal limits. There is aortic atherosclerosis. No adenopathy. No bone lesions. IMPRESSION: Left base airspace consolidation consistent with  pneumonia. Lungs elsewhere clear. Heart upper normal in size. There is aortic atherosclerosis. Aortic Atherosclerosis (ICD10-I70.0). Electronically Signed   By: Lowella Grip III M.D.   On: 02/15/2017 10:41   Ct Chest Wo Contrast  Result Date: 03/10/2017 CLINICAL DATA:  Fever today.  Scrotal swelling. EXAM: CT CHEST, ABDOMEN, AND PELVIS WITHOUT AND CT ABDOMEN AND PELVIS WITH CONTRAST TECHNIQUE: Multidetector CT imaging of the chest, abdomen and pelvis was performed following the standard protocol before bolus administration of intravenous contrast. CT abdomen and pelvis was then performed after bolus administration of intravenous contrast. CONTRAST:  100 ml ISOVUE-300 IOPAMIDOL (ISOVUE-300) INJECTION 61% COMPARISON:  Single-view of the chest 03/09/2017. CT abdomen and pelvis 09/01/2014. CT chest 05/01/2013. FINDINGS: CT CHEST FINDINGS Cardiovascular: Heart size is normal. No pleural or pericardial effusion. Aortic atherosclerosis  noted. No aneurysm. Mediastinum/Nodes: 1.5 cm left precarinal node noted. No hiatal hernia. Thyroid appears normal. Lungs/Pleura: No pleural effusion. The lungs demonstrate emphysematous disease. Bilateral lower lobe airspace disease is worse on the left. Peribronchial thickening is seen in the lower lobes bilaterally. No nodule or mass. Musculoskeletal: Small Schmorl's node in the superior endplate of T8 is noted. No fracture. No lytic or sclerotic lesion. CT ABDOMEN PELVIS FINDINGS Hepatobiliary: No focal liver abnormality is seen. No gallstones, gallbladder wall thickening, or biliary dilatation. Pancreas: Unremarkable. No pancreatic ductal dilatation or surrounding inflammatory changes. Spleen: Normal in size without focal abnormality. Adrenals/Urinary Tract: Adrenal glands are unremarkable. Kidneys are normal, without renal calculi, focal lesion, or hydronephrosis. Bladder is unremarkable. Stomach/Bowel: Hartmann's pouch is identified. Colostomy in the left mid abdomen is identified. The colon is otherwise unremarkable. The appendix is not visualized but no evidence of appendicitis is seen. The small bowel appears normal. Feeding tube is in place in the stomach. Vascular/Lymphatic: Aortic atherosclerosis. No enlarged abdominal or pelvic lymph nodes. Reproductive: Prostate is unremarkable. Moderately large left hydrocele is chronic and seen on the 2016 exam. Other: The patient has a large left inguinal hernia containing loops of small and large bowel without complicating feature. The hernia extends into the scrotum. The appearance is unchanged since the prior CT. Musculoskeletal: No lytic or sclerotic lesion. Degenerative disc disease L5-S1 noted. IMPRESSION: Bilateral lower lobe pneumonia is worse on the left. Peribronchial thickening consistent with bronchitis is also identified. No other acute finding chest, abdomen or pelvis. Calcific aortic and coronary atherosclerosis. Emphysema. Large left inguinal hernia  containing loops of small bowel extends into the scrotum. No bowel obstruction or other complicating feature. Moderate left hydrocele. 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 No complicating feature. Electronically Signed   By: Inge Rise M.D.   On: 03/10/2017 01:54   Dg Chest Portable 1 View  Result Date: 03/09/2017 CLINICAL DATA:  Low blood pressure with fever and cough EXAM: PORTABLE CHEST 1 VIEW COMPARISON:  02/15/2017, 06/20/2016 FINDINGS: Persistent airspace disease at the left lung base. Streaky atelectasis at the right base. No large effusion. Mild cardiomegaly with slight central vascular congestion. Aortic atherosclerosis. No pneumothorax. IMPRESSION: 1. Persistent airspace disease at the left base suspicious for pneumonia. Possible tiny left effusion 2. Mild cardiomegaly with central vascular congestion Electronically Signed   By: Donavan Foil M.D.   On: 03/09/2017 21:11    Orson Eva, DO  [facility] Pager [phone]  If 7PM-7AM, please contact night-coverage [email or web address] Password [email or web address] 03/11/2017, 1:48 PM   LOS: 1 day

## 2017-03-12 ENCOUNTER — Inpatient Hospital Stay (HOSPITAL_COMMUNITY): Payer: Medicare Other

## 2017-03-12 DIAGNOSIS — J9601 Acute respiratory failure with hypoxia: Secondary | ICD-10-CM

## 2017-03-12 LAB — RESPIRATORY PANEL BY PCR
Adenovirus: NOT DETECTED
Bordetella pertussis: NOT DETECTED
CHLAMYDOPHILA PNEUMONIAE-RVPPCR: NOT DETECTED
CORONAVIRUS NL63-RVPPCR: NOT DETECTED
Coronavirus 229E: NOT DETECTED
Coronavirus HKU1: NOT DETECTED
Coronavirus OC43: NOT DETECTED
INFLUENZA A H1-RVPPCR: NOT DETECTED
INFLUENZA A-RVPPCR: NOT DETECTED
INFLUENZA B-RVPPCR: NOT DETECTED
Influenza A H1 2009: NOT DETECTED
Influenza A H3: NOT DETECTED
Metapneumovirus: DETECTED — AB
Mycoplasma pneumoniae: NOT DETECTED
PARAINFLUENZA VIRUS 3-RVPPCR: NOT DETECTED
PARAINFLUENZA VIRUS 4-RVPPCR: NOT DETECTED
Parainfluenza Virus 1: NOT DETECTED
Parainfluenza Virus 2: NOT DETECTED
RESPIRATORY SYNCYTIAL VIRUS-RVPPCR: NOT DETECTED
RHINOVIRUS / ENTEROVIRUS - RVPPCR: NOT DETECTED

## 2017-03-12 LAB — URINALYSIS, COMPLETE (UACMP) WITH MICROSCOPIC
BILIRUBIN URINE: NEGATIVE
Glucose, UA: NEGATIVE mg/dL
HGB URINE DIPSTICK: NEGATIVE
KETONES UR: NEGATIVE mg/dL
LEUKOCYTES UA: NEGATIVE
NITRITE: NEGATIVE
PH: 5 (ref 5.0–8.0)
Protein, ur: NEGATIVE mg/dL
SPECIFIC GRAVITY, URINE: 1.019 (ref 1.005–1.030)
Squamous Epithelial / LPF: NONE SEEN

## 2017-03-12 LAB — GLUCOSE, CAPILLARY
Glucose-Capillary: 62 mg/dL — ABNORMAL LOW (ref 65–99)
Glucose-Capillary: 73 mg/dL (ref 65–99)

## 2017-03-12 LAB — LEGIONELLA PNEUMOPHILA SEROGP 1 UR AG: L. PNEUMOPHILA SEROGP 1 UR AG: NEGATIVE

## 2017-03-12 LAB — PROCALCITONIN: PROCALCITONIN: 0.43 ng/mL

## 2017-03-12 MED ORDER — IPRATROPIUM-ALBUTEROL 0.5-2.5 (3) MG/3ML IN SOLN
3.0000 mL | Freq: Four times a day (QID) | RESPIRATORY_TRACT | Status: DC
  Administered 2017-03-12: 3 mL via RESPIRATORY_TRACT
  Filled 2017-03-12: qty 3

## 2017-03-12 MED ORDER — IPRATROPIUM-ALBUTEROL 0.5-2.5 (3) MG/3ML IN SOLN: 3.0000 mL | Freq: Four times a day (QID) | RESPIRATORY_TRACT | Status: DC | PRN

## 2017-03-12 MED ORDER — RESOURCE THICKENUP CLEAR PO POWD
ORAL | Status: DC | PRN
  Filled 2017-03-12: qty 125

## 2017-03-12 NOTE — Progress Notes (Signed)
PROGRESS NOTE  EASTON FETTY FGH:[id number] DOB: Sep 07, 1955 DOA: 03/09/2017 PCP: Hilbert Corrigan, MD  Brief History: 62 year old male with a history of dysarthria,dysphasia, dysphagia,schizoaffective disorder, seizure disorder, hyperlipidemia, hypothyroidism presenting from his nursing facility with fever and low blood pressure. The patient is a very difficult historian secondary to his severe dysarthria. Furthermore, he is a poor historian at baseline. The patient was recently admitted to the hospital from 02/15/17 through 02/16/17 for HCAP. He was discharged with Amox/clav. According to the medical records, the patient is on mechanical soft diet with thin liquids at his nursing facility. His gastrostomy tube is used only for medications. The review of his medical records, it appears that the patient has severe dysphagia and was previously recommended to be n.p.o. status with all feeding/medications to be going through PEG tube. Patient continued to eat and drink. He accepted the risks of aspiration and subsequent pneumonia.This was addressed again with the patient during his last hospital admission 1 month ago, and the patient wanted to continue eating despite the risks of aspiration. Upon presentation, the patient had a fever up to 102.0 F and soft blood pressures with tachycardia. The patient was started on vancomycin and Zosyn. Vancomycin was ultimately discontinued.    Assessment/Plan: sepsis -Secondary to aspiration pneumonia -Continue IV fluids--add KCl -Lactic acid peaked at 2.46>>>0.7 -Procalcitonin 0.35>>>0.39 -Discontinue vancomycin -continue zosyn -03/12/17--fever of 102.4 -03/12/17--repeat blood cultures -03/12/17 repeat CXR--now on oxygen  Acute respiratory failure with hypoxia -Likely due to recurrent aspiration -Case was discussed with speech therapy -Repeat chest x-ray -Start bronchodilators  Aspiration pneumonia -Continue Zosyn -Speech  therapy evaluation appreciated -switch to puree with thin liquids--discussed with speech -Personally reviewed chest x-ray--bibasilar opacities left greater than right -03/09/2017 CT chest--bilateral lower lobe airspace disease, left greater than right. Peribronchial thickening bilateral. -discussed with sister-->today more concerned why patient's swallowing is not better and why it cannot be fixed -consult palliative medicine  Thrombocytopenia -This has been chronic, likely due to medications -Monitor for signs of bleeding -A.m. CBC  Hypothyroidism -Continue Synthroid  Schizoaffective disorder -Continue Haldol, Zyprexa, Depakote   seizure disorder -Continue Keppra  Hyperlipidemia -Continue statin  Goals of care -Patient is DNR -03/12/2017--spoke again with sister--she is more hesitant about "comfort feeding" today asking questions about wh the patient swallowing is not better, and why it cannot be fixed. -consult palliative medicine   Disposition Plan:Not stable for D/C Family Communication:updated sister on phone 3/4--Total time spent 35 minutes.  Greater than 50% spent face to face counseling and coordinating care.    Consultants:none  Code Status: DNR  DVT Prophylaxis: SCDs   Procedures: As Listed in Progress Note Above  Antibiotics: vanco 3/1>>>3/2 Zosyn 3/1>>>      Subjective: Review of systems is limited secondary the patient's cognitive impairment.  However the patient denies any headache, neck pain, chest pain, shortness breath, vomiting, abdominal pain.  Objective: Vitals:   03/12/17 1330 03/12/17 1345 03/12/17 1400 03/12/17 1415  BP:   (!) 84/53 (!) 100/54  Pulse: (!) 106 (!) 102 (!) 102 (!) 101  Resp: 19 (!) 21 18 20   Temp:      TempSrc:      SpO2: (!) 89% (!) 84% 95% 96%  Weight:      Height:        Intake/Output Summary (Last 24 hours) at 03/12/2017 1427 Last data filed at 03/12/2017 0800 Gross per 24 hour    Intake 1393.75 ml  Output 3450  ml  Net -2056.25 ml   Weight change: 0.4 kg (14.1 oz) Exam:   General:  Pt is alert, follows commands appropriately, not in acute distress  HEENT: No icterus, No thrush, No neck mass, Harris/AT  Cardiovascular: RRR, S1/S2, no rubs, no gallops  Respiratory: Bibasilar rales.  No wheezing.  Abdomen: Soft/+BS, non tender, non distended, no guarding  Extremities: No edema, No lymphangitis, No petechiae, No rashes, no synovitis   Data Reviewed: I have personally reviewed following labs and imaging studies Basic Metabolic Panel: Recent Labs  Lab 03/09/17 2155 03/10/17 0427 03/11/17 0527  NA 139 140 142  K 4.1 4.0 3.8  CL 100* 107 109  CO2 26 24 25   GLUCOSE 77 92 126*  BUN 22* 15 14  CREATININE 1.00 0.86 0.98  CALCIUM 9.3 8.3* 7.8*  MG  --   --  1.7   Liver Function Tests: Recent Labs  Lab 03/09/17 2155 03/10/17 0427  AST 20 21  ALT 13* 10*  ALKPHOS 68 56  BILITOT 0.2* 0.4  PROT 8.8* 7.0  ALBUMIN 4.0 3.1*   No results for input(s): LIPASE, AMYLASE in the last 168 hours. No results for input(s): AMMONIA in the last 168 hours. Coagulation Profile: No results for input(s): INR, PROTIME in the last 168 hours. CBC: Recent Labs  Lab 03/09/17 2155 03/10/17 0427 03/11/17 0527  WBC 8.4 7.5 4.5  NEUTROABS 6.4  --   --   HGB 12.5* 11.4* 10.1*  HCT 40.5 36.5* 33.6*  MCV 90.6 89.9 91.6  PLT 74* 75* 60*   Cardiac Enzymes: No results for input(s): CKTOTAL, CKMB, CKMBINDEX, TROPONINI in the last 168 hours. BNP: Invalid input(s): POCBNP CBG: Recent Labs  Lab 03/10/17 0834 03/11/17 0740 03/12/17 0747 03/12/17 0748  GLUCAP 74 105* 62* 73   HbA1C: No results for input(s): HGBA1C in the last 72 hours. Urine analysis:    Component Value Date/Time   COLORURINE YELLOW 03/09/2017 2204   APPEARANCEUR CLEAR 03/09/2017 2204   LABSPEC 1.017 03/09/2017 2204   PHURINE 6.0 03/09/2017 2204   GLUCOSEU NEGATIVE 03/09/2017 2204   HGBUR  NEGATIVE 03/09/2017 [place] 03/09/2017 [place] 03/09/2017 2204   PROTEINUR NEGATIVE 03/09/2017 2204   UROBILINOGEN 1.0 11/19/2014 0152   NITRITE NEGATIVE 03/09/2017 2204   LEUKOCYTESUR NEGATIVE 03/09/2017 2204   Sepsis Labs: @LABRCNTIP (procalcitonin:4,lacticidven:4) ) Recent Results (from the past 240 hour(s))  Blood Culture (routine x 2)     Status: None (Preliminary result)   Collection Time: 03/09/17  9:55 PM  Result Value Ref Range Status   Specimen Description RIGHT ANTECUBITAL  Final   Special Requests   Final    BOTTLES DRAWN AEROBIC AND ANAEROBIC Blood Culture adequate volume DRAWN BY RN   Culture   Final    NO GROWTH 3 DAYS Performed at [facility], [address], [place], [place] [address]    Report Status PENDING  Incomplete  Blood Culture (routine x 2)     Status: None (Preliminary result)   Collection Time: 03/09/17 10:05 PM  Result Value Ref Range Status   Specimen Description BLOOD LEFT HAND  Final   Special Requests   Final    BOTTLES DRAWN AEROBIC ONLY Blood Culture results may not be optimal due to an inadequate volume of blood received in culture bottles   Culture   Final    NO GROWTH 3 DAYS Performed at [facility], [address], [place], [place] [address]    Report  Status PENDING  Incomplete     Scheduled Meds: . cholecalciferol  1,000 Units Oral Daily  . divalproex  625 mg Oral BID  . ferrous sulfate  325 mg Oral Q breakfast  . guaiFENesin  20 mL Oral BID  . haloperidol  10 mg Oral QHS  . levETIRAcetam  250 mg Oral BID  . levothyroxine  100 mcg Oral QAC breakfast  . loratadine  10 mg Oral Daily  . mouth rinse  15 mL Mouth Rinse BID  . OLANZapine zydis  5 mg Oral QHS  . oxybutynin  5 mg Oral BID  . polyethylene glycol  17 g Oral Daily  . pravastatin  10 mg Oral q1800  . vitamin C  500 mg Oral BID   Continuous Infusions: . 0.9 % NaCl with KCl 20 mEq / L 75 mL/hr at 03/12/17 0451  .  piperacillin-tazobactam (ZOSYN)  IV 3.375 g (03/12/17 1308)    Procedures/Studies: Ct Abdomen Pelvis W Wo Contrast  Result Date: 03/10/2017 CLINICAL DATA:  Fever today.  Scrotal swelling. EXAM: CT CHEST, ABDOMEN, AND PELVIS WITHOUT AND CT ABDOMEN AND PELVIS WITH CONTRAST TECHNIQUE: Multidetector CT imaging of the chest, abdomen and pelvis was performed following the standard protocol before bolus administration of intravenous contrast. CT abdomen and pelvis was then performed after bolus administration of intravenous contrast. CONTRAST:  100 ml ISOVUE-300 IOPAMIDOL (ISOVUE-300) INJECTION 61% COMPARISON:  Single-view of the chest 03/09/2017. CT abdomen and pelvis 09/01/2014. CT chest 05/01/2013. FINDINGS: CT CHEST FINDINGS Cardiovascular: Heart size is normal. No pleural or pericardial effusion. Aortic atherosclerosis noted. No aneurysm. Mediastinum/Nodes: 1.5 cm left precarinal node noted. No hiatal hernia. Thyroid appears normal. Lungs/Pleura: No pleural effusion. The lungs demonstrate emphysematous disease. Bilateral lower lobe airspace disease is worse on the left. Peribronchial thickening is seen in the lower lobes bilaterally. No nodule or mass. Musculoskeletal: Small Schmorl's node in the superior endplate of T8 is noted. No fracture. No lytic or sclerotic lesion. CT ABDOMEN PELVIS FINDINGS Hepatobiliary: No focal liver abnormality is seen. No gallstones, gallbladder wall thickening, or biliary dilatation. Pancreas: Unremarkable. No pancreatic ductal dilatation or surrounding inflammatory changes. Spleen: Normal in size without focal abnormality. Adrenals/Urinary Tract: Adrenal glands are unremarkable. Kidneys are normal, without renal calculi, focal lesion, or hydronephrosis. Bladder is unremarkable. Stomach/Bowel: Hartmann's pouch is identified. Colostomy in the left mid abdomen is identified. The colon is otherwise unremarkable. The appendix is not visualized but no evidence of appendicitis is seen.  The small bowel appears normal. Feeding tube is in place in the stomach. Vascular/Lymphatic: Aortic atherosclerosis. No enlarged abdominal or pelvic lymph nodes. Reproductive: Prostate is unremarkable. Moderately large left hydrocele is chronic and seen on the 2016 exam. Other: The patient has a large left inguinal hernia containing loops of small and large bowel without complicating feature. The hernia extends into the scrotum. The appearance is unchanged since the prior CT. Musculoskeletal: No lytic or sclerotic lesion. Degenerative disc disease L5-S1 noted. IMPRESSION: Bilateral lower lobe pneumonia is worse on the left. Peribronchial thickening consistent with bronchitis is also identified. No other acute finding chest, abdomen or pelvis. Calcific aortic and coronary atherosclerosis. Emphysema. Large left inguinal hernia containing loops of small bowel extends into the scrotum. No bowel obstruction or other complicating feature. Moderate left hydrocele. 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 No complicating feature. Electronically Signed   By: Inge Rise M.D.   On: 03/10/2017 01:54   Dg Chest 2 View  Result Date: 02/15/2017 CLINICAL DATA:  Fever and cough EXAM: CHEST  2 VIEW COMPARISON:  June 20, 2016 FINDINGS: There is airspace consolidation in the left lower lobe consistent with pneumonia. Lungs elsewhere clear. Heart is upper normal in size with pulmonary vascularity within normal limits. There is aortic atherosclerosis. No adenopathy. No bone lesions. IMPRESSION: Left base airspace consolidation consistent with pneumonia. Lungs elsewhere clear. Heart upper normal in size. There is aortic atherosclerosis. Aortic Atherosclerosis (ICD10-I70.0). Electronically Signed   By: Lowella Grip III M.D.   On: 02/15/2017 10:41   Ct Chest Wo Contrast  Result Date: 03/10/2017 CLINICAL DATA:  Fever today.  Scrotal swelling. EXAM: CT CHEST, ABDOMEN,  AND PELVIS WITHOUT AND CT ABDOMEN AND PELVIS WITH CONTRAST TECHNIQUE: Multidetector CT imaging of the chest, abdomen and pelvis was performed following the standard protocol before bolus administration of intravenous contrast. CT abdomen and pelvis was then performed after bolus administration of intravenous contrast. CONTRAST:  100 ml ISOVUE-300 IOPAMIDOL (ISOVUE-300) INJECTION 61% COMPARISON:  Single-view of the chest 03/09/2017. CT abdomen and pelvis 09/01/2014. CT chest 05/01/2013. FINDINGS: CT CHEST FINDINGS Cardiovascular: Heart size is normal. No pleural or pericardial effusion. Aortic atherosclerosis noted. No aneurysm. Mediastinum/Nodes: 1.5 cm left precarinal node noted. No hiatal hernia. Thyroid appears normal. Lungs/Pleura: No pleural effusion. The lungs demonstrate emphysematous disease. Bilateral lower lobe airspace disease is worse on the left. Peribronchial thickening is seen in the lower lobes bilaterally. No nodule or mass. Musculoskeletal: Small Schmorl's node in the superior endplate of T8 is noted. No fracture. No lytic or sclerotic lesion. CT ABDOMEN PELVIS FINDINGS Hepatobiliary: No focal liver abnormality is seen. No gallstones, gallbladder wall thickening, or biliary dilatation. Pancreas: Unremarkable. No pancreatic ductal dilatation or surrounding inflammatory changes. Spleen: Normal in size without focal abnormality. Adrenals/Urinary Tract: Adrenal glands are unremarkable. Kidneys are normal, without renal calculi, focal lesion, or hydronephrosis. Bladder is unremarkable. Stomach/Bowel: Hartmann's pouch is identified. Colostomy in the left mid abdomen is identified. The colon is otherwise unremarkable. The appendix is not visualized but no evidence of appendicitis is seen. The small bowel appears normal. Feeding tube is in place in the stomach. Vascular/Lymphatic: Aortic atherosclerosis. No enlarged abdominal or pelvic lymph nodes. Reproductive: Prostate is unremarkable. Moderately large  left hydrocele is chronic and seen on the 2016 exam. Other: The patient has a large left inguinal hernia containing loops of small and large bowel without complicating feature. The hernia extends into the scrotum. The appearance is unchanged since the prior CT. Musculoskeletal: No lytic or sclerotic lesion. Degenerative disc disease L5-S1 noted. IMPRESSION: Bilateral lower lobe pneumonia is worse on the left. Peribronchial thickening consistent with bronchitis is also identified. No other acute finding chest, abdomen or pelvis. Calcific aortic and coronary atherosclerosis. Emphysema. Large left inguinal hernia containing loops of small bowel extends into the scrotum. No bowel obstruction or other complicating feature. Moderate left hydrocele. 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 No complicating feature. Electronically Signed   By: Inge Rise M.D.   On: 03/10/2017 01:54   Dg Chest Portable 1 View  Result Date: 03/09/2017 CLINICAL DATA:  Low blood pressure with fever and cough EXAM: PORTABLE CHEST 1 VIEW COMPARISON:  02/15/2017, 06/20/2016 FINDINGS: Persistent airspace disease at the left lung base. Streaky atelectasis at the right base. No large effusion. Mild cardiomegaly with slight central vascular congestion. Aortic atherosclerosis. No pneumothorax. IMPRESSION: 1. Persistent airspace disease at the left base suspicious for pneumonia. Possible tiny left  effusion 2. Mild cardiomegaly with central vascular congestion Electronically Signed   By: Donavan Foil M.D.   On: 03/09/2017 21:11    Orson Eva, DO  [facility] Pager [phone]  If 7PM-7AM, please contact night-coverage [email or web address] Password [email or web address] 03/12/2017, 2:27 PM   LOS: 2 days

## 2017-03-12 NOTE — Progress Notes (Signed)
Pharmacy Antibiotic Note  Steven David is a 62 y.o. male admitted on 03/09/2017 with sepsis.  Pharmacy has been consulted for zosyn dosing.  Plan: Cont zosyn 3.375 gm IV q8 hours F/u renal function, cultures and clinical course  Height: 5\' 9"  (175.3 cm) Weight: 152 lb 12.5 oz (69.3 kg) IBW/kg (Calculated) : 70.7  Temp (24hrs), Avg:99.8 F (37.7 C), Min:98.5 F (36.9 C), Max:102.4 F (39.1 C)  Recent Labs  Lab 03/09/17 2155 03/09/17 2215 03/10/17 0023 03/10/17 0427 03/11/17 0527  WBC 8.4  --   --  7.5 4.5  CREATININE 1.00  --   --  0.86 0.98  LATICACIDVEN  --  0.49* 2.46* 0.7  --     Estimated Creatinine Clearance: 78.6 mL/min (by C-G formula based on SCr of 0.98 mg/dL).    No Known Allergies  Antimicrobials this admission: vanc 3/1 >>3/3  zosyn 3/1 >>   3/1  BC>>ngtd  Thank you for allowing pharmacy to be a part of this patient's care.  Excell Seltzer [place] 03/12/2017 11:47 AM

## 2017-03-13 ENCOUNTER — Encounter (HOSPITAL_COMMUNITY): Payer: Self-pay | Admitting: Primary Care

## 2017-03-13 DIAGNOSIS — A419 Sepsis, unspecified organism: Principal | ICD-10-CM

## 2017-03-13 DIAGNOSIS — Z7189 Other specified counseling: Secondary | ICD-10-CM

## 2017-03-13 DIAGNOSIS — Z515 Encounter for palliative care: Secondary | ICD-10-CM

## 2017-03-13 DIAGNOSIS — J690 Pneumonitis due to inhalation of food and vomit: Secondary | ICD-10-CM

## 2017-03-13 DIAGNOSIS — F2089 Other schizophrenia: Secondary | ICD-10-CM

## 2017-03-13 LAB — COMPREHENSIVE METABOLIC PANEL
ALBUMIN: 2.5 g/dL — AB (ref 3.5–5.0)
ALT: 13 U/L — ABNORMAL LOW (ref 17–63)
ANION GAP: 9 (ref 5–15)
AST: 26 U/L (ref 15–41)
Alkaline Phosphatase: 42 U/L (ref 38–126)
BUN: 13 mg/dL (ref 6–20)
CALCIUM: 7.8 mg/dL — AB (ref 8.9–10.3)
CHLORIDE: 104 mmol/L (ref 101–111)
CO2: 28 mmol/L (ref 22–32)
Creatinine, Ser: 0.91 mg/dL (ref 0.61–1.24)
GFR calc non Af Amer: >60 mL/min (ref >60)
GLUCOSE: 82 mg/dL (ref 65–99)
POTASSIUM: 4.5 mmol/L (ref 3.5–5.1)
SODIUM: 141 mmol/L (ref 135–145)
Total Bilirubin: 0.3 mg/dL (ref 0.3–1.2)
Total Protein: 6.3 g/dL — ABNORMAL LOW (ref 6.5–8.1)

## 2017-03-13 LAB — CBC
HCT: 35.4 % — ABNORMAL LOW (ref 39.0–52.0)
Hemoglobin: 10.8 g/dL — ABNORMAL LOW (ref 13.0–17.0)
MCH: 28 pg (ref 26.0–34.0)
MCHC: 30.5 g/dL (ref 30.0–36.0)
MCV: 91.7 fL (ref 78.0–100.0)
PLATELETS: 48 10*3/uL — AB (ref 150–400)
RBC: 3.86 MIL/uL — ABNORMAL LOW (ref 4.22–5.81)
RDW: 16.5 % — AB (ref 11.5–15.5)
WBC: 6 10*3/uL (ref 4.0–10.5)

## 2017-03-13 LAB — GLUCOSE, CAPILLARY: Glucose-Capillary: 97 mg/dL (ref 65–99)

## 2017-03-13 NOTE — Progress Notes (Signed)
PROGRESS NOTE  Steven David MCN:[id number] DOB: [birth year] DOA: 03/09/2017 PCP: Hilbert Corrigan, MD  Brief History: 62 year old male with a history of dysarthria,dysphasia, dysphagia,schizoaffective disorder, seizure disorder, hyperlipidemia, hypothyroidism presenting from his nursing facility with fever and low blood pressure. The patient is a very difficult historian secondary to his severe dysarthria. Furthermore, he is a poor historian at baseline. The patient was recently admitted to the hospital from 02/15/17 through 02/16/17 for HCAP. He was discharged with Amox/clav. According to the medical records, the patient is on mechanical soft diet with thin liquids at his nursing facility. His gastrostomy tube is used only for medications. The review of his medical records, it appears that the patient has severe dysphagia and was previously recommended to be n.p.o. status with all feeding/medications to be going through PEG tube. Patient continued to eat and drink. He accepted the risks of aspiration and subsequent pneumonia.This was addressed again with the patient during his last hospitaladmission 1 month ago, and the patient wanted to continue eating despite the risks of aspiration. Upon presentation, the patient had a fever up to 102.0 F and soft blood pressures with tachycardia. The patient was started on vancomycin and Zosyn. Vancomycin was ultimately discontinued.    Assessment/Plan: sepsis -Secondary to aspiration pneumonia -Continue IV fluids--add KCl -Lactic acid peaked at 2.46>>>0.7 -Procalcitonin 0.35>>>0.39 -Discontinue vancomycin -continue zosyn -03/12/17--repeat blood cultures--neg -still having fevers but trending down -repeat CT chest if continued fevers -influenza neg -respiratory virus panel-->+metapneumovirus -BP remains soft but improving  Acute respiratory failure with hypoxia -Likely due to recurrent aspiration in setting of  metapneumovirus respiratory infection -Case was discussed with speech therapy -Start bronchodilators -stable on 3L  Aspiration pneumonia -Continue Zosyn -Speech therapy evaluationappreciated -switch to puree with thin liquids--discussed with speech -3/4-Personally reviewed chest x-ray--bibasilar opacities left greater than right -03/09/2017 CT chest--bilateral lower lobe airspace disease, left greater than right. Peribronchial thickening bilateral. -discussed with sister-->today more concerned why patient's swallowing is not better and why it cannot be fixed -consult palliative medicine -still having fevers but trending down -repeat CT chest if continued fevers  Thrombocytopenia -This has been chronic, likely due to medications -Monitor for signs of bleeding -check coags, fibrinogen, B12 -A.m. CBC  Hypothyroidism -Continue Synthroid  Schizoaffective disorder -Continue Haldol, Zyprexa, Depakote   seizure disorder -Continue Keppra  Hyperlipidemia -Continue statin  Left hydrocele/inguinal hernia -stable without pain  Goals of care -Patient is DNR -consult palliative medicine   Disposition Plan:Not stable for D/C Family Communication:updated sister on phone 3/5    Consultants:none  Code Status: DNR  DVT Prophylaxis: SCDs   Procedures: As Listed in Progress Note Above  Antibiotics: vanco 3/1>>>3/2 Zosyn 3/1>>>     Subjective: Patient continues to have difficulty swallowing although it is better with the change in diet.  He is still choking on some foods.  He denies any chest pain, shortness breath, abdominal pain, headache, neck pain.  Objective: Vitals:   03/13/17 0400 03/13/17 0500 03/13/17 0800 03/13/17 1200  BP:    (!) 186/127  Pulse:    100  Resp:    17  Temp: 98.8 F (37.1 C)  99.1 F (37.3 C) 98.6 F (37 C)  TempSrc: Oral  Oral Axillary  SpO2:    96%  Weight:  70 kg (154 lb 5.2 oz)    Height:         Intake/Output Summary (Last 24 hours) at 03/13/2017 1708 Last data filed at 03/13/2017  1300 Gross per 24 hour  Intake 480 ml  Output 1900 ml  Net -1420 ml   Weight change: 0.7 kg (1 lb 8.7 oz) Exam:   General:  Pt is alert, follows commands appropriately, not in acute distress  HEENT: No icterus, No thrush, No neck mass, [place]/AT  Cardiovascular: RRR, S1/S2, no rubs, no gallops  Respiratory: Bibasilar rales no wheezing  Abdomen: Soft/+BS, non tender, non distended, no guarding  Extremities: No edema, No lymphangitis, No petechiae, No rashes, no synovitis   Data Reviewed: I have personally reviewed following labs and imaging studies Basic Metabolic Panel: Recent Labs  Lab 03/09/17 2155 03/10/17 0427 03/11/17 0527 03/13/17 0403  NA 139 140 142 141  K 4.1 4.0 3.8 4.5  CL 100* 107 109 104  CO2 26 24 25 28   GLUCOSE 77 92 126* 82  BUN 22* 15 14 13   CREATININE 1.00 0.86 0.98 0.91  CALCIUM 9.3 8.3* 7.8* 7.8*  MG  --   --  1.7  --    Liver Function Tests: Recent Labs  Lab 03/09/17 2155 03/10/17 0427 03/13/17 0403  AST 20 21 26   ALT 13* 10* 13*  ALKPHOS 68 56 42  BILITOT 0.2* 0.4 0.3  PROT 8.8* 7.0 6.3*  ALBUMIN 4.0 3.1* 2.5*   No results for input(s): LIPASE, AMYLASE in the last 168 hours. No results for input(s): AMMONIA in the last 168 hours. Coagulation Profile: No results for input(s): INR, PROTIME in the last 168 hours. CBC: Recent Labs  Lab 03/09/17 2155 03/10/17 0427 03/11/17 0527 03/13/17 0403  WBC 8.4 7.5 4.5 6.0  NEUTROABS 6.4  --   --   --   HGB 12.5* 11.4* 10.1* 10.8*  HCT 40.5 36.5* 33.6* 35.4*  MCV 90.6 89.9 91.6 91.7  PLT 74* 75* 60* 48*   Cardiac Enzymes: No results for input(s): CKTOTAL, CKMB, CKMBINDEX, TROPONINI in the last 168 hours. BNP: Invalid input(s): POCBNP CBG: Recent Labs  Lab 03/10/17 0834 03/11/17 0740 03/12/17 0747 03/12/17 0748 03/13/17 0813  GLUCAP 74 105* 62* 73 97   HbA1C: No results for input(s): HGBA1C  in the last 72 hours. Urine analysis:    Component Value Date/Time   COLORURINE YELLOW 03/12/2017 [place] 03/12/2017 1451   LABSPEC 1.019 03/12/2017 1451   PHURINE 5.0 03/12/2017 1451   GLUCOSEU NEGATIVE 03/12/2017 1451   HGBUR NEGATIVE 03/12/2017 1451   BILIRUBINUR NEGATIVE 03/12/2017 1451   KETONESUR NEGATIVE 03/12/2017 1451   PROTEINUR NEGATIVE 03/12/2017 1451   UROBILINOGEN 1.0 11/19/2014 0152   NITRITE NEGATIVE 03/12/2017 1451   LEUKOCYTESUR NEGATIVE 03/12/2017 1451   Sepsis Labs: @LABRCNTIP (procalcitonin:4,lacticidven:4) ) Recent Results (from the past 240 hour(s))  Blood Culture (routine x 2)     Status: None (Preliminary result)   Collection Time: 03/09/17  9:55 PM  Result Value Ref Range Status   Specimen Description RIGHT ANTECUBITAL  Final   Special Requests   Final    BOTTLES DRAWN AEROBIC AND ANAEROBIC Blood Culture adequate volume DRAWN BY RN   Culture   Final    NO GROWTH 4 DAYS Performed at [facility], [address] [place], [place] [address]    Report Status PENDING  Incomplete  Blood Culture (routine x 2)     Status: None (Preliminary result)   Collection Time: 03/09/17 10:05 PM  Result Value Ref Range Status   Specimen Description BLOOD LEFT HAND  Final   Special Requests   Final    BOTTLES DRAWN AEROBIC  ONLY Blood Culture results may not be optimal due to an inadequate volume of blood received in culture bottles   Culture   Final    NO GROWTH 4 DAYS Performed at [facility], [address] [place], [place] [address]    Report Status PENDING  Incomplete  Culture, blood (Routine X 2) w Reflex to ID Panel     Status: None (Preliminary result)   Collection Time: 03/12/17  2:23 PM  Result Value Ref Range Status   Specimen Description BLOOD BLOOD RIGHT HAND  Final   Special Requests   Final    BOTTLES DRAWN AEROBIC AND ANAEROBIC Blood Culture adequate volume   Culture   Final    NO GROWTH < 24 HOURS Performed at [facility], [address], [place], [place] [address]    Report Status PENDING  Incomplete  Respiratory Panel by PCR     Status: Abnormal   Collection Time: 03/12/17  2:23 PM  Result Value Ref Range Status   Adenovirus NOT DETECTED NOT DETECTED Final   Coronavirus 229E NOT DETECTED NOT DETECTED Final   Coronavirus HKU1 NOT DETECTED NOT DETECTED Final   Coronavirus NL63 NOT DETECTED NOT DETECTED Final   Coronavirus OC43 NOT DETECTED NOT DETECTED Final   Metapneumovirus DETECTED (A) NOT DETECTED Final   Rhinovirus / Enterovirus NOT DETECTED NOT DETECTED Final   Influenza A NOT DETECTED NOT DETECTED Final   Influenza A H1 NOT DETECTED NOT DETECTED Final   Influenza A H1 2009 NOT DETECTED NOT DETECTED Final   Influenza A H3 NOT DETECTED NOT DETECTED Final   Influenza B NOT DETECTED NOT DETECTED Final   Parainfluenza Virus 1 NOT DETECTED NOT DETECTED Final   Parainfluenza Virus 2 NOT DETECTED NOT DETECTED Final   Parainfluenza Virus 3 NOT DETECTED NOT DETECTED Final   Parainfluenza Virus 4 NOT DETECTED NOT DETECTED Final   Respiratory Syncytial Virus NOT DETECTED NOT DETECTED Final   Bordetella pertussis NOT DETECTED NOT DETECTED Final   Chlamydophila pneumoniae NOT DETECTED NOT DETECTED Final   Mycoplasma pneumoniae NOT DETECTED NOT DETECTED Final    Comment: Performed at [facility], [place] [address] [place], [place] [address]  Culture, blood (Routine X 2) w Reflex to ID Panel     Status: None (Preliminary result)   Collection Time: 03/12/17  2:34 PM  Result Value Ref Range Status   Specimen Description BLOOD BLOOD LEFT HAND  Final   Special Requests   Final    BOTTLES DRAWN AEROBIC AND ANAEROBIC Blood Culture adequate volume   Culture   Final    NO GROWTH < 24 HOURS Performed at [facility], [address], [place],  [address]    Report Status PENDING  Incomplete     Scheduled Meds: . cholecalciferol  1,000 Units Oral Daily  . divalproex  625 mg Oral BID  . ferrous  sulfate  325 mg Oral Q breakfast  . guaiFENesin  20 mL Oral BID  . haloperidol  10 mg Oral QHS  . levETIRAcetam  250 mg Oral BID  . levothyroxine  100 mcg Oral QAC breakfast  . loratadine  10 mg Oral Daily  . mouth rinse  15 mL Mouth Rinse BID  . OLANZapine zydis  5 mg Oral QHS  . oxybutynin  5 mg Oral BID  . polyethylene glycol  17 g Oral Daily  . pravastatin  10 mg Oral q1800  . vitamin C  500 mg Oral BID  Continuous Infusions: . piperacillin-tazobactam (ZOSYN)  IV 3.375 g (03/13/17 1512)    Procedures/Studies: Ct Abdomen Pelvis W Wo Contrast  Result Date: 03/10/2017 CLINICAL DATA:  Fever today.  Scrotal swelling. EXAM: CT CHEST, ABDOMEN, AND PELVIS WITHOUT AND CT ABDOMEN AND PELVIS WITH CONTRAST TECHNIQUE: Multidetector CT imaging of the chest, abdomen and pelvis was performed following the standard protocol before bolus administration of intravenous contrast. CT abdomen and pelvis was then performed after bolus administration of intravenous contrast. CONTRAST:  100 ml ISOVUE-300 IOPAMIDOL (ISOVUE-300) INJECTION 61% COMPARISON:  Single-view of the chest 03/09/2017. CT abdomen and pelvis 09/01/2014. CT chest 05/01/2013. FINDINGS: CT CHEST FINDINGS Cardiovascular: Heart size is normal. No pleural or pericardial effusion. Aortic atherosclerosis noted. No aneurysm. Mediastinum/Nodes: 1.5 cm left precarinal node noted. No hiatal hernia. Thyroid appears normal. Lungs/Pleura: No pleural effusion. The lungs demonstrate emphysematous disease. Bilateral lower lobe airspace disease is worse on the left. Peribronchial thickening is seen in the lower lobes bilaterally. No nodule or mass. Musculoskeletal: Small Schmorl's node in the superior endplate of T8 is noted. No fracture. No lytic or sclerotic lesion. CT ABDOMEN PELVIS FINDINGS Hepatobiliary: No focal liver abnormality is seen. No gallstones, gallbladder wall thickening, or biliary dilatation. Pancreas: Unremarkable. No pancreatic ductal dilatation  or surrounding inflammatory changes. Spleen: Normal in size without focal abnormality. Adrenals/Urinary Tract: Adrenal glands are unremarkable. Kidneys are normal, without renal calculi, focal lesion, or hydronephrosis. Bladder is unremarkable. Stomach/Bowel: Hartmann's pouch is identified. Colostomy in the left mid abdomen is identified. The colon is otherwise unremarkable. The appendix is not visualized but no evidence of appendicitis is seen. The small bowel appears normal. Feeding tube is in place in the stomach. Vascular/Lymphatic: Aortic atherosclerosis. No enlarged abdominal or pelvic lymph nodes. Reproductive: Prostate is unremarkable. Moderately large left hydrocele is chronic and seen on the 2016 exam. Other: The patient has a large left inguinal hernia containing loops of small and large bowel without complicating feature. The hernia extends into the scrotum. The appearance is unchanged since the prior CT. Musculoskeletal: No lytic or sclerotic lesion. Degenerative disc disease L5-S1 noted. IMPRESSION: Bilateral lower lobe pneumonia is worse on the left. Peribronchial thickening consistent with bronchitis is also identified. No other acute finding chest, abdomen or pelvis. Calcific aortic and coronary atherosclerosis. Emphysema. Large left inguinal hernia containing loops of small bowel extends into the scrotum. No bowel obstruction or other complicating feature. Moderate left hydrocele. 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 No complicating feature. Electronically Signed   By: Inge Rise M.D.   On: 03/10/2017 01:54   Dg Chest 2 View  Result Date: 02/15/2017 CLINICAL DATA:  Fever and cough EXAM: CHEST  2 VIEW COMPARISON:  June 20, 2016 FINDINGS: There is airspace consolidation in the left lower lobe consistent with pneumonia. Lungs elsewhere clear. Heart is upper normal in size with pulmonary vascularity within normal limits. There is  aortic atherosclerosis. No adenopathy. No bone lesions. IMPRESSION: Left base airspace consolidation consistent with pneumonia. Lungs elsewhere clear. Heart upper normal in size. There is aortic atherosclerosis. Aortic Atherosclerosis (ICD10-I70.0). Electronically Signed   By: Lowella Grip III M.D.   On: 02/15/2017 10:41   Ct Chest Wo Contrast  Result Date: 03/10/2017 CLINICAL DATA:  Fever today.  Scrotal swelling. EXAM: CT CHEST, ABDOMEN, AND PELVIS WITHOUT AND CT ABDOMEN AND PELVIS WITH CONTRAST TECHNIQUE: Multidetector CT imaging of the chest, abdomen and pelvis was performed following the standard protocol before bolus administration of  intravenous contrast. CT abdomen and pelvis was then performed after bolus administration of intravenous contrast. CONTRAST:  100 ml ISOVUE-300 IOPAMIDOL (ISOVUE-300) INJECTION 61% COMPARISON:  Single-view of the chest 03/09/2017. CT abdomen and pelvis 09/01/2014. CT chest 05/01/2013. FINDINGS: CT CHEST FINDINGS Cardiovascular: Heart size is normal. No pleural or pericardial effusion. Aortic atherosclerosis noted. No aneurysm. Mediastinum/Nodes: 1.5 cm left precarinal node noted. No hiatal hernia. Thyroid appears normal. Lungs/Pleura: No pleural effusion. The lungs demonstrate emphysematous disease. Bilateral lower lobe airspace disease is worse on the left. Peribronchial thickening is seen in the lower lobes bilaterally. No nodule or mass. Musculoskeletal: Small Schmorl's node in the superior endplate of T8 is noted. No fracture. No lytic or sclerotic lesion. CT ABDOMEN PELVIS FINDINGS Hepatobiliary: No focal liver abnormality is seen. No gallstones, gallbladder wall thickening, or biliary dilatation. Pancreas: Unremarkable. No pancreatic ductal dilatation or surrounding inflammatory changes. Spleen: Normal in size without focal abnormality. Adrenals/Urinary Tract: Adrenal glands are unremarkable. Kidneys are normal, without renal calculi, focal lesion, or  hydronephrosis. Bladder is unremarkable. Stomach/Bowel: Hartmann's pouch is identified. Colostomy in the left mid abdomen is identified. The colon is otherwise unremarkable. The appendix is not visualized but no evidence of appendicitis is seen. The small bowel appears normal. Feeding tube is in place in the stomach. Vascular/Lymphatic: Aortic atherosclerosis. No enlarged abdominal or pelvic lymph nodes. Reproductive: Prostate is unremarkable. Moderately large left hydrocele is chronic and seen on the 2016 exam. Other: The patient has a large left inguinal hernia containing loops of small and large bowel without complicating feature. The hernia extends into the scrotum. The appearance is unchanged since the prior CT. Musculoskeletal: No lytic or sclerotic lesion. Degenerative disc disease L5-S1 noted. IMPRESSION: Bilateral lower lobe pneumonia is worse on the left. Peribronchial thickening consistent with bronchitis is also identified. No other acute finding chest, abdomen or pelvis. Calcific aortic and coronary atherosclerosis. Emphysema. Large left inguinal hernia containing loops of small bowel extends into the scrotum. No bowel obstruction or other complicating feature. Moderate left hydrocele. 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 No complicating feature. Electronically Signed   By: Inge Rise M.D.   On: 03/10/2017 01:54   Dg Chest Port 1 View  Result Date: 03/12/2017 CLINICAL DATA:  Aspiration pneumonia EXAM: PORTABLE CHEST 1 VIEW COMPARISON:  03/10/2017 chest CT, 03/09/2017 plain film FINDINGS: Cardiac shadow is stable. Aortic calcifications are again seen. Persistent basilar infiltrate is noted on the left. The right lung base is incompletely evaluated but appears stable. No bony abnormality is seen. IMPRESSION: Stable left basilar infiltrate. Electronically Signed   By: Inez Catalina M.D.   On: 03/12/2017 16:29   Dg Chest Portable 1  View  Result Date: 03/09/2017 CLINICAL DATA:  Low blood pressure with fever and cough EXAM: PORTABLE CHEST 1 VIEW COMPARISON:  02/15/2017, 06/20/2016 FINDINGS: Persistent airspace disease at the left lung base. Streaky atelectasis at the right base. No large effusion. Mild cardiomegaly with slight central vascular congestion. Aortic atherosclerosis. No pneumothorax. IMPRESSION: 1. Persistent airspace disease at the left base suspicious for pneumonia. Possible tiny left effusion 2. Mild cardiomegaly with central vascular congestion Electronically Signed   By: Donavan Foil M.D.   On: 03/09/2017 21:11    Orson Eva, DO  [facility] Pager [phone]  If 7PM-7AM, please contact night-coverage [email or web address] Password [email or web address] 03/13/2017, 5:08 PM   LOS: 3 days

## 2017-03-13 NOTE — Progress Notes (Signed)
Speech Language Pathology Treatment: Dysphagia  Patient Details Name: Steven David MRN: [id number] DOB: 10-31-1955 Today's Date: 03/13/2017 Time: 1660-6301 SLP Time Calculation (min) (ACUTE ONLY): 22 min  Assessment / Plan / Recommendation Clinical Impression  Pt recalled my name from previous day and asked, "Do you aspirate too?". SLP provided education regarding aspiration and reasons for diet modification. SLP reiterated the reason for thickening his liquids and reminded him that the thin soda made him cough, gurgle, and desaturate. Pt appears willing to adhere to this "safest diet" recommendation when reminded of the reasons for it.  I spoke with Pt's sister, Steven David, on the telephone for ~25 minutes, giving details regarding his numerous swallow assessments and current recommendations. She reported that Steven David grew up in [place], but lived in [facility] in [place] for ten years before it closed down and he was moved to group homes in this part of the [place]. She has not seen her brother "for a very long time" and has no car. Their mother passed away in 04-23-89 and it sounds like she was the one most involved in his care until her death. Steven David states that she doesn't want him to "choke and die", but also wants him to be able to eat by mouth if that is what he wants. I shared that Steven David has been at risk for aspiration since at least 2013/04/23 (first MBSS on record), but has "tolerated" eating by mouth until more recently given recent hospitalizations for PNA. SLP further explained that this would likely increase in frequency as he ages and is dependent upon others for self care (oral care, feeding, and limited mobility). She voices understanding that the Pt has chosen to modify his diet (D2 and NTL) in the hopes of reducing, but not eliminating risks for aspiration. She asked if I would be able to text a photo of him to her. I explained that I could not do that, but would call her from his  room tomorrow so they could talk.   Continue D2/chopped and NTL.   HPI HPI: 62 year old male with a history of dysarthria, dysphasia, dysphagia, schizoaffective disorder, seizure disorder, hyperlipidemia, hypothyroidism presenting from his nursing facility with fever and low blood pressure.  The patient is a very difficult historian secondary to his severe dysarthria.  Furthermore, he is a poor historian at baseline.  The patient was recently admitted to the hospital from 02/15/17 through 02/16/17 for HCAP.  He was discharged with Amox/clav.  According to the medical records, the patient is on mechanical soft diet with thin liquids at his nursing facility.  His gastrostomy tube is used only for medications.  The review of his medical records, it appears that the patient has severe dysphagia and was previously recommended to be n.p.o. status with all feeding/medications to be going through PEG tube.  Patient continued to eat and drink.  He accepted the risks of aspiration and subsequent pneumonia.  This was addressed again with the patient during his last hospita admission 1 month ago, and the patient wanted to continue eating despite the risks of aspiration.  Upon presentation, the patient had a fever up to 102.0 F and soft blood pressures with tachycardia.  The patient was started on vancomycin and Zosyn. BSE requested.       SLP Plan  Continue with current plan of care       Recommendations  Diet recommendations: Dysphagia 2 (fine chop);Nectar-thick liquid Liquids provided via: Cup;Straw Medication Administration: Crushed with puree(or via PEG)  Supervision: Staff to assist with self feeding;Full supervision/cueing for compensatory strategies Compensations: Slow rate;Clear throat intermittently Postural Changes and/or Swallow Maneuvers: Seated upright 90 degrees;Upright 30-60 min after meal                Oral Care Recommendations: Oral care before and after PO;Staff/trained caregiver to  provide oral care Follow up Recommendations: 24 hour supervision/assistance SLP Visit Diagnosis: Dysphagia, oropharyngeal phase (R13.12) Plan: Continue with current plan of care       Thank you,  Steven David, [place]                 [place] 03/13/2017, 7:45 PM

## 2017-03-13 NOTE — Progress Notes (Signed)
Nutrition Follow-up   INTERVENTION:  Dysphagia 2 diet with NECTAR liquids   NUTRITION DIAGNOSIS:   Swallow difficulty related to chronic dysphagia as evidenced by ST assessments including MBSS findings and recurrent aspiration pneumonia   GOAL: Pt to meet >/= 90% of their estimated nutrition needs  while minimizing aspiration risk as much as possible    MONITOR:  Po intake, labs, skin assessments and wt trends     REASON FOR ASSESSMENT: Patient admitted with PEG tube     ASSESSMENT:  Patient is from skilled nursing facility. Hx of schizophrenia, severe oropharyngeal dysphagia, Malnutrition (stable) and colostomy LLQ. PEG placed in August of 2016. PEG is only being used for medication administration.   He presents febrile with sepsis likely related to recurring aspiration per MD.   His appetite is very good- discussed with nursing 100% of meals consumed. He does require feeding assistance.  He takes a vitamin D supplement, ferrous sulfate and vitamin C.   The patient has steady wt gain noted below. Significant increase (9lb) 6.2% the past month and a gain of 20 lbs (13%) the past 6 months. No edema to lower extremities. His BMI is in normal range.   Wt Readings from Last 15 Encounters:  03/13/17 154 lb 5.2 oz (70 kg)  02/15/17 145 lb 4.5 oz (65.9 kg)  10/20/16 142 lb 14.4 oz (64.8 kg)  09/18/16 134 lb (60.8 kg)  08/23/16 139 lb (63 kg)  06/20/16 139 lb (63 kg)  06/01/16 139 lb (63 kg)  04/25/16 130 lb (59 kg)  12/29/15 130 lb (59 kg)  08/18/15 130 lb (59 kg)  01/25/15 107 lb 6.4 oz (48.7 kg)  11/19/14 120 lb 13 oz (54.8 kg)  09/25/14 113 lb (51.3 kg)  09/15/14 125 lb (56.7 kg)  09/10/14 123 lb 10.9 oz (56.1 kg)      Vitals:   03/13/17 0200 03/13/17 0400 03/13/17 0500 03/13/17 0800  BP: (!) 83/48     Pulse: 79     Resp: 18     Temp:  98.8 F (37.1 C)  99.1 F (37.3 C)  TempSrc:  Oral  Oral  SpO2: 93%     Weight:   154 lb 5.2 oz (70 kg)   Height:        BMP  Latest Ref Rng & Units 03/13/2017 03/11/2017 03/10/2017  Glucose 65 - 99 mg/dL 82 126(H) 92  BUN 6 - 20 mg/dL 13 14 15   Creatinine 0.61 - 1.24 mg/dL 0.91 0.98 0.86  Sodium 135 - 145 mmol/L 141 142 140  Potassium 3.5 - 5.1 mmol/L 4.5 3.8 4.0  Chloride 101 - 111 mmol/L 104 109 107  CO2 22 - 32 mmol/L 28 25 24   Calcium 8.9 - 10.3 mg/dL 7.8(L) 7.8(L) 8.3(L)     NUTRITION - FOCUSED PHYSICAL EXAM: Mild muscle loss improved with continued wt gain.   Diet Order:  DIET DYS 2 Room service appropriate? Yes; Fluid consistency: Nectar Thick  EDUCATION NEEDS:    Skin:  MASD  Last BM:   3/4 pouch changed (200 ml)  Height:   Ht Readings from Last 1 Encounters:  03/10/17 5\' 9"  (1.753 m)    Weight:   Wt Readings from Last 1 Encounters:  03/13/17 154 lb 5.2 oz (70 kg)    Ideal Body Weight:     BMI:  Body mass index is 22.79 kg/m.  Estimated Nutritional Needs:   Kcal:  1900-2100   Protein:   78-85 gr  Fluid:   >  1900 ml daily   Colman Cater MS,RD,CSG,LDN Office: [phone] Pager: [phone]

## 2017-03-13 NOTE — Consult Note (Signed)
Consultation Note Date: 03/13/2017   Patient Name: Steven David  DOB: [birth year]  MRN: [id number]  Age / Sex: 62 y.o., male  PCP: Hilbert Corrigan, MD Referring Physician: Orson Eva, MD  Reason for Consultation: Establishing goals of care and Psychosocial/spiritual support  HPI/Patient Profile: 62 y.o. 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   Clinical Assessment and Goals of Care: Steven David is resting quietly in bed.  He is resting with his eyes closed, but quickly opens his eyes when I call his name.  He makes and briefly keeps eye contact.  He is calm and cooperative.  There is no family at bedside at this time.  Steven David takes a sip of his thickened liquid at bedside.  He has a wet cough afterwards, and his face turns red.  Call to residential SNF, curious spoke with Education officer, museum.  She states that Steven David emergency contact is first, his brother Steven David, second his sister Steven David, and third he is his own responsible party.  They share that their speech therapist has worked with him extensively but he continues to desire by mouth nutrition.  Steven David has been at residential SNF since 2016 approximately.  Social worker states that initially he did not want a PEG tube but  relented.  She states that he is unable/unwilling to maintain n.p.o. status.  She states he will go to the drink machine and get his own drinks, and will often take food and liquid from other residents.  Call to brother, Steven David.  Left voicemail message requesting return call. Call to sister,  Steven David.  Left voicemail message requesting return call. Conference with hospitalist and in-house social worker related to disposition.  Call to brother, Steven David.  We talked about  [place] health status.  We talked about speech therapy consult.  We talked about returning to residential SNF.  Steven David states that his brother has been institutionalized for most of his life.  Steven David also states that he and his sister Steven David want to respect Cole, and listen to his choices (he shares that Steven David chose DNR).  We talked about recurrent aspiration, including treatment options such as speech therapy consult, restrictive diet.  I review speech therapy consult with Steven David.  Steven David endorses that Kden enjoys food, and family does not want to totally restrict him from eating.  Unfortunately at this point Steven David also stated that if Londell needs hospitalization "20 more times" then we should provide it.  I speak with Steven David about the chronic illness pathway, what is normal and expected.  I share with him that Leopold does not have 20 more hospitalizations in him.  I share that due to his physical and mental health, he will not have the normal male life span.  Steven David endorses this to be correct.  I encouraged him to place himself in his brother shoes, pray about what is right for Steven David.  I share that there are ways that we can continue to care for Marrion if/when we can no longer cure him.    I encouraged Steven David to speak with his sister about future choices.  Steven David asked that I call his sister.  I share that we call 1 person and ask them to share information with family.  I encouraged Steven David to think about, and discuss, future choices for Steven David with Steven David.  Also encouraged him to ask for palliative consult when Izear returns to the hospital.  Healthcare power of attorney NEXT OF KIN - no legal paperwork noted in chart. Bro. Steven David and sister Steven David.    SUMMARY OF RECOMMENDATIONS   At this point, continue to treat the  treatable. Continue to rehospitalize for pneumonia if needed. Continue with restrictive diet in the hopes of reducing aspiration.  Code Status/Advance Care Planning:  DNR  Symptom Management:   Per hospitalist, no additional needs at this time.  Palliative Prophylaxis:   Aspiration and Oral Care  Additional Recommendations (Limitations, Scope, Preferences):  At this point, continue to treat the treatable but no CPR, no intubation.  Psycho-social/Spiritual:   Desire for further Chaplaincy support:no  Additional Recommendations: Caregiving  Support/Resources and Education on Hospice  Prognosis:   < 6 months, 3 months or less would not be surprising based on recurrent aspiration pneumonia, patient's inability to maintain n.p.o. status, albumin of 2.5.  Discharge Planning: Patient is agreeable to return to residential SNF, they are willing to take him back.      Primary Diagnoses: Present on Admission: . Hypothyroidism . Thrombocytopenia ([place]) . Schizophrenia ([place]) . Sepsis ([place]) . Pneumonia   I have reviewed the medical record, interviewed the patient and family, and examined the patient. The following aspects are pertinent.  Past Medical History:  Diagnosis Date  . Aggressive outburst   . Cognitive communication deficit   . Colostomy in place [facility])   . Drug-induced Parkinsonism ([place])   . Dysphagia   . Dysphagia   . Hard of hearing    Wears bilateral hearing aides  . Hernia   . High cholesterol   . HOH (hard of hearing)   . Malnutrition ([place])   . Malnutrition ([place])   . Schizoaffective disorder ([place])   . Schizophrenia ([place])   . Seizures ([place])   . Thyroid disease   . TIA (transient ischemic attack)    Social History   Socioeconomic History  . Marital status: Single    Spouse name: None  . Number of children: None  . Years of education: None  . Highest education level: None  Social Needs  . Financial resource strain: None  . Food insecurity -  worry: None  . Food insecurity - inability: None  . Transportation needs - medical: None  . Transportation needs - non-medical: None  Occupational History  . None  Tobacco Use  . Smoking status: Current Every Day Smoker  . Smokeless tobacco: Never Used  Substance and Sexual Activity  . Alcohol use: No  . Drug use: No  . Sexual activity: No  Other Topics Concern  . None  Social History Narrative  . None   Family History  Family history unknown: Yes   Scheduled Meds: . cholecalciferol  1,000 Units Oral Daily  . divalproex  625 mg Oral BID  . ferrous sulfate  325 mg Oral Q breakfast  .  guaiFENesin  20 mL Oral BID  . haloperidol  10 mg Oral QHS  . levETIRAcetam  250 mg Oral BID  . levothyroxine  100 mcg Oral QAC breakfast  . loratadine  10 mg Oral Daily  . mouth rinse  15 mL Mouth Rinse BID  . OLANZapine zydis  5 mg Oral QHS  . oxybutynin  5 mg Oral BID  . polyethylene glycol  17 g Oral Daily  . pravastatin  10 mg Oral q1800  . vitamin C  500 mg Oral BID   Continuous Infusions: . piperacillin-tazobactam (ZOSYN)  IV Stopped (03/13/17 1040)   PRN Meds:.acetaminophen (TYLENOL) oral liquid 160 mg/5 mL, ipratropium-albuterol, ondansetron **OR** ondansetron (ZOFRAN) IV, RESOURCE THICKENUP CLEAR Medications Prior to Admission:  Prior to Admission medications   Medication Sig Start Date End Date Taking? Authorizing Provider  acetaminophen (TYLENOL) 325 MG tablet Place 650 mg into feeding tube every 6 (six) hours as needed for moderate pain or fever.   Yes [provider]  aspirin 325 MG tablet Take 325 mg by mouth daily.   Yes [provider]  cholecalciferol (VITAMIN D) 1000 units tablet Take 1,000 Units by mouth daily.   Yes [provider]  divalproex (DEPAKOTE SPRINKLE) 125 MG capsule Take 625 mg by mouth 2 (two) times daily.   Yes [provider]  ferrous sulfate 325 (65 FE) MG tablet 325 mg daily with breakfast. Per tube   Yes [provider]  furosemide (LASIX) 20 MG tablet Place 10 mg into feeding tube 2 (two) times daily.    Yes [provider]  guaifenesin (HUMIBID E) 400 MG TABS tablet Take 400 mg by mouth 2 (two) times daily. Per tube    Yes [provider]  haloperidol (HALDOL) 10 MG tablet Take one (1) tablet by mouth at bedtime 11/14/16 03/13/17 Yes [provider]  haloperidol decanoate (HALDOL DECANOATE) 50 MG/ML injection Inject 100 mg into the muscle every 28 (twenty-eight) days.   Yes [provider]  levETIRAcetam (KEPPRA) 250 MG tablet Take 250 mg by mouth 2 (two) times daily. Per tube   Yes [provider]  levothyroxine (SYNTHROID, LEVOTHROID) 100 MCG tablet Take 100 mcg by mouth daily before breakfast.   Yes [provider]  loratadine (CLARITIN) 10 MG tablet Place 1 tablet (10 mg total) into feeding tube daily. 09/10/14  Yes Randon Goldsmith, Megan N, PA-C  lovastatin (MEVACOR) 20 MG tablet Place 1 tablet (20 mg total) into feeding tube daily. 09/10/14  Yes Randon Goldsmith, Megan N, PA-C  Nutritional Supplements (FEEDING SUPPLEMENT, JEVITY 1.5 CAL/FIBER,) LIQD Place 1,000 mLs into feeding tube continuous. Initiate nocturnal feedings of Jevity 1.5 @ 100 ml/hr via PEG over 14 hour period 09/10/14  Yes Randon Goldsmith, Megan N, PA-C  OLANZapine zydis (ZYPREXA) 5 MG disintegrating tablet Take 5 mg by mouth at bedtime.   Yes [provider]  oxybutynin (DITROPAN) 5 MG tablet Take 5 mg by mouth 2 (two) times daily.   Yes [provider]  polyethylene glycol (MIRALAX / GLYCOLAX) packet Take 17 g by mouth daily.   Yes [provider]  vitamin C (ASCORBIC ACID) 500 MG tablet Take 500 mg by mouth 2 (two) times daily.    Yes [provider]   No Known Allergies Review of Systems  Unable to perform ROS: Psychiatric disorder    Physical Exam  Constitutional: He is oriented to person, place, and time. No distress.  Appears chronically ill, makes and briefly keeps  eye  contact  HENT:  Head: Atraumatic.  Cardiovascular: Normal rate and regular rhythm.  Pulmonary/Chest: Effort normal. No respiratory distress.  Abdominal: Soft. He exhibits no distension.  PEG tube site  Musculoskeletal: He exhibits no edema or deformity.  Neurological: He is alert and oriented to person, place, and time.  Skin: Skin is warm.  Psychiatric:  Known schizoaffective disorder, calm at this time  Nursing note and vitals reviewed.   Vital Signs: BP (!) 186/127   Pulse 100   Temp 98.6 F (37 C) (Axillary)   Resp 17   Ht 5\' 9"  (1.753 m)   Wt 70 kg (154 lb 5.2 oz)   SpO2 96%   BMI 22.79 kg/m  Pain Assessment: No/denies pain   Pain Score: 0-No pain   SpO2: SpO2: 96 % O2 Device:SpO2: 96 % O2 Flow Rate: .O2 Flow Rate (L/min): 2 L/min  IO: Intake/output summary:   Intake/Output Summary (Last 24 hours) at 03/13/2017 1257 Last data filed at 03/13/2017 0500 Gross per 24 hour  Intake -  Output 1900 ml  Net -1900 ml    LBM: Last BM Date: 03/13/17 Baseline Weight: Weight: 65.8 kg (145 lb) Most recent weight: Weight: 70 kg (154 lb 5.2 oz)     Palliative Assessment/Data:   Flowsheet Rows     Most Recent Value  Intake Tab  Referral Department  Hospitalist  Unit at Time of Referral  ICU  Palliative Care Primary Diagnosis  Pulmonary  Date Notified  03/12/17  Palliative Care Type  New Palliative care  Date of Admission  03/09/17  Date first seen by Palliative Care  03/13/17  # of days Palliative referral response time  1 Day(s)  # of days IP prior to Palliative referral  3  Clinical Assessment  Palliative Performance Scale Score  40%  Pain Max last 24 hours  Not able to report  Pain Min Last 24 hours  Not able to report  Dyspnea Max Last 24 Hours  Not able to report  Dyspnea Min Last 24 hours  Not able to report  Psychosocial & Spiritual Assessment  Palliative Care Outcomes  Patient/Family meeting held?  Yes  Who was at the meeting?  Patient at bedside,  call to siblings left voicemail messages  Palliative Care Outcomes  Clarified goals of care, Provided psychosocial or spiritual support  Patient/Family wishes: Interventions discontinued/not started   Mechanical Ventilation      Time In:     0920 and 1250 Time Out:  1000 and 1340 Time Total: 40 + 50 = 90 minutes Greater than 50%  of this time was spent counseling and coordinating care related to the above assessment and plan.  Signed by: Drue Novel, NP   Please contact Palliative Medicine Team phone at [phone] for questions and concerns.  For individual provider: See Shea Evans

## 2017-03-14 LAB — CULTURE, BLOOD (ROUTINE X 2)
Culture: NO GROWTH
Culture: NO GROWTH

## 2017-03-14 LAB — CBC
HCT: 33.7 % — ABNORMAL LOW (ref 39.0–52.0)
Hemoglobin: 10.1 g/dL — ABNORMAL LOW (ref 13.0–17.0)
MCH: 27.4 pg (ref 26.0–34.0)
MCHC: 30 g/dL (ref 30.0–36.0)
MCV: 91.3 fL (ref 78.0–100.0)
PLATELETS: 49 10*3/uL — AB (ref 150–400)
RBC: 3.69 MIL/uL — AB (ref 4.22–5.81)
RDW: 16.3 % — ABNORMAL HIGH (ref 11.5–15.5)
WBC: 6.2 10*3/uL (ref 4.0–10.5)

## 2017-03-14 LAB — URINE CULTURE: CULTURE: NO GROWTH

## 2017-03-14 LAB — PROTIME-INR
INR: 0.99
PROTHROMBIN TIME: 13 s (ref 11.4–15.2)

## 2017-03-14 LAB — FIBRINOGEN: FIBRINOGEN: 553 mg/dL — AB (ref 210–475)

## 2017-03-14 LAB — GLUCOSE, CAPILLARY: Glucose-Capillary: 76 mg/dL (ref 65–99)

## 2017-03-14 LAB — VITAMIN B12: Vitamin B-12: 656 pg/mL (ref 180–914)

## 2017-03-14 LAB — APTT: APTT: 35 s (ref 24–36)

## 2017-03-14 MED ORDER — SODIUM CHLORIDE 0.9 % IV SOLN
INTRAVENOUS | Status: AC
  Administered 2017-03-14: 18:00:00 via INTRAVENOUS

## 2017-03-14 NOTE — Progress Notes (Signed)
Daily Progress Note   Patient Name: Steven David       Date: 03/14/2017 DOB: 1955/01/20  Age: 62 y.o. MRN#: [id number] Attending Physician: Isaac Bliss, [place] Primary Care Physician: Hilbert Corrigan, MD Admit Date: 03/09/2017  Reason for Consultation/Follow-up: Establishing goals of care and Psychosocial/spiritual support  Subjective: Steven David is resting quietly in bed.  He looks calm and comfortable, but still chronically ill.  He will only briefly open his eyes when I speak to him.  He does not try to communicate with me.  There is no family at bedside at this time.  Family is approximately 3 hours away via car.  Call to brother [name], update given.  I share that Mr. David has been shown positive on respiratory panel for a virus that causes pneumonia.  Steven states that he, his sister Steven David, and their half brother will likely come to visit on Saturday.  I share that Steven David will likely be returned to his skilled nursing facility.  I ask if [name] has any questions about the concept of rehospitalizing.  He states not at this time.  I share my goal today was to ask [name] about rehospitalization, but he declined to speak to me.  I encouraged Steven and his family to talk with [name] about his desire to be hospitalized.   Steven asks that we call him upon discharge.  I share that nursing staff should notify him about discharge.  No further questions or concerns.  I encouraged Steven to call at any time.  Length of Stay: 4  Current Medications: Scheduled Meds:  . cholecalciferol  1,000 Units Oral Daily  . divalproex  625 mg Oral BID  . ferrous sulfate  325 mg Oral Q breakfast  . guaiFENesin  20 mL Oral BID  . haloperidol  10 mg Oral QHS  . levETIRAcetam  250 mg Oral BID  .  levothyroxine  100 mcg Oral QAC breakfast  . loratadine  10 mg Oral Daily  . mouth rinse  15 mL Mouth Rinse BID  . OLANZapine zydis  5 mg Oral QHS  . oxybutynin  5 mg Oral BID  . polyethylene glycol  17 g Oral Daily  . pravastatin  10 mg Oral q1800  . vitamin C  500 mg Oral BID  Continuous Infusions: . sodium chloride 75 mL/hr at 03/14/17 1200  . piperacillin-tazobactam (ZOSYN)  IV 3.375 g (03/14/17 1356)    PRN Meds: acetaminophen (TYLENOL) oral liquid 160 mg/5 mL, ipratropium-albuterol, ondansetron **OR** ondansetron (ZOFRAN) IV, RESOURCE THICKENUP CLEAR  Physical Exam  Constitutional: No distress.  Appears frail, chronically ill  HENT:  Head: Atraumatic.  Cardiovascular: Normal rate and regular rhythm.  Pulmonary/Chest: Effort normal. No respiratory distress.  Abdominal: Soft. He exhibits no distension.  PEG tube site  Musculoskeletal: He exhibits no edema.  Neurological:  Sleepy, opens eyes briefly, does not try to communicate  Skin: Skin is warm and dry.  Nursing note and vitals reviewed.           Vital Signs: BP (!) 76/52   Pulse 75   Temp 98.1 F (36.7 C) (Oral)   Resp 13   Ht 5\' 9"  (1.753 m)   Wt 67.7 kg (149 lb 4 oz)   SpO2 97%   BMI 22.04 kg/m  SpO2: SpO2: 97 % O2 Device: O2 Device: Nasal Cannula O2 Flow Rate: O2 Flow Rate (L/min): 3 L/min  Intake/output summary:   Intake/Output Summary (Last 24 hours) at 03/14/2017 1516 Last data filed at 03/14/2017 8527 Gross per 24 hour  Intake 650 ml  Output 3000 ml  Net -2350 ml   LBM: Last BM Date: 03/13/17 Baseline Weight: Weight: 65.8 kg (145 lb) Most recent weight: Weight: 67.7 kg (149 lb 4 oz)       Palliative Assessment/Data:    Flowsheet Rows     Most Recent Value  Intake Tab  Referral Department  Hospitalist  Unit at Time of Referral  ICU  Palliative Care Primary Diagnosis  Pulmonary  Date Notified  03/12/17  Palliative Care Type  New Palliative care  Date of Admission  03/09/17  Date  first seen by Palliative Care  03/13/17  # of days Palliative referral response time  1 Day(s)  # of days IP prior to Palliative referral  3  Clinical Assessment  Palliative Performance Scale Score  40%  Pain Max last 24 hours  Not able to report  Pain Min Last 24 hours  Not able to report  Dyspnea Max Last 24 Hours  Not able to report  Dyspnea Min Last 24 hours  Not able to report  Psychosocial & Spiritual Assessment  Palliative Care Outcomes  Patient/Family meeting held?  Yes  Who was at the meeting?  Patient at bedside, call to siblings left voicemail messages  Palliative Care Outcomes  Clarified goals of care, Provided psychosocial or spiritual support  Patient/Family wishes: Interventions discontinued/not started   Mechanical Ventilation      Patient Active Problem List   Diagnosis Date Noted  . Goals of care, counseling/discussion   . Palliative care by specialist   . DNR (do not resuscitate) discussion   . Acute respiratory failure with hypoxia ([place]) 03/12/2017  . Aspiration pneumonia of both lower lobes due to gastric secretions ([place]) 03/10/2017  . Unilateral recurrent inguinal hernia without obstruction or gangrene   . Left inguinal hernia 02/15/2017  . Dysarthria 02/15/2017  . Malfunction of percutaneous endoscopic gastrostomy (PEG) tube ([place])   . Malnutrition of moderate degree 11/20/2014  . HCAP (healthcare-associated pneumonia) 11/19/2014  . Postoperative anemia due to acute blood loss 09/07/2014  . Seizure ([place])   . Incarcerated inguinal hernia 09/01/2014  . Respiratory failure, acute ([place]) 09/01/2014  . Metabolic acidosis 78/24/2353  . Incarcerated hernia 09/01/2014  . Septic shock ([place]) 09/01/2014  .  AKI (acute kidney injury) ([place])   . Protein-calorie malnutrition, severe ([place]) 04/13/2013  . Sepsis ([place]) 04/11/2013  . H/O stroke within last year 04/11/2013  . Hypothyroidism 04/11/2013  . Pneumonia 04/11/2013  . Thrombocytopenia ([place]) 04/11/2013  .  Schizophrenia ([place]) 04/11/2013  . Malnutrition ([place]) 04/11/2013  . SIRS (systemic inflammatory response syndrome) ([place]) 04/11/2013    Palliative Care Assessment & Plan   Patient Profile: 62 y.o. 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   Assessment:  sepsis secondary to aspiration pneumonia: Initially treated with Vanco and Zosyn which have been DC'd. continue IV fluids, Respiratory virus panel is positive for Metapneumovirus, iInfluenza PCR negative.   Recommendations/Plan:  At this point, continue to treat the treatable but no CPR, no intubation.  Continue discussions related to rehospitalization.  Goals of Care and Additional Recommendations:  Limitations on Scope of Treatment: No CPR, no intubation  Code Status:    Code Status Orders  (From admission, onward)        Start     Ordered   03/10/17 0222  Do not attempt resuscitation (DNR)  Continuous    Question Answer Comment  In the event of cardiac or respiratory ARREST Do not call a "code blue"   In the event of cardiac or respiratory ARREST Do not perform Intubation, CPR, defibrillation or ACLS   In the event of cardiac or respiratory ARREST Use medication by any route, position, wound care, and other measures to relive pain and suffering. May use oxygen, suction and manual treatment of airway obstruction as needed for comfort.      03/10/17 0225    Code Status History    Date Active Date Inactive Code Status Order ID Comments User Context   02/15/2017 18:52 02/17/2017 00:04 DNR [id number]  Kathie Dike, MD Inpatient   06/02/2016 00:19 06/02/2016 18:01 Full Code [id number]  Delora Fuel, MD ED    11/19/2014 04:37 11/22/2014 19:35 Full Code [id number]  Orvan Falconer, MD Inpatient   09/01/2014 07:05 09/10/2014 19:21 Full Code [id number]  Rolm Bookbinder, MD Inpatient   04/11/2013 18:35 04/16/2013 16:18 Full Code [id number]  Louellen Molder, MD Inpatient    Advance Directive Documentation     Most Recent Value  Type of Advance Directive  Out of facility DNR (pink MOST or yellow form)  Pre-existing out of facility DNR order (yellow form or pink MOST form)  Yellow form placed in chart (order not valid for inpatient use)  "MOST" Form in Place?  No data       Prognosis:   < 6 months, 3 months or less would not be surprising based on recurrent aspiration pneumonia, patient's inability to maintain n.p.o. status, albumin of 2.5.  Discharge Planning:  Patient is agreeable to return to residential SNF, they are willing to take him back.  Care plan was discussed with nursing staff, social worker, and Dr. Jerilee Hoh.  Thank you for allowing the Palliative Medicine Team to assist in the care of this patient.   Time In: 1440 Time Out: 1505 Total Time 25 minutes Prolonged Time Billed  no       Greater than 50%  of this time was spent counseling and coordinating care related to the above assessment and plan.  Drue Novel, NP  Please contact Palliative Medicine  Team phone at [phone] for questions and concerns.

## 2017-03-14 NOTE — Progress Notes (Addendum)
Speech Language Pathology Treatment: Dysphagia  Patient Details Name: Steven David MRN: [id number] DOB: Jul 23, 1955 Today's Date: 03/14/2017 Time: 2671-2458 SLP Time Calculation (min) (ACUTE ONLY): 23 min  Assessment / Plan / Recommendation Clinical Impression  Pt seen at bedside for ongoing diagnostic dysphagia intervention (education and therapeutic exercises). SLP shared phone conversation held previous night with his sister, Steven David. Steven David asked that I call her today around 9:30 so that she could speak with Pt. I called both her cell phone and land line and got no answer and no voicemail box set up. Pt visibly disappointed, however I left her phone number on a piece of paper in his his room. Steven David stated that he wanted to have thin liquids. SLP again reviewed the reasons for thickening his liquids and stated that if he decided that he wanted to have thin liquids regardless of consequences to improve his quality of life, we could honor that. He appears conflicted on whether to chose adherence to chopped diet with nectar-thick liquids with slightly minimized but still known aspiration versus chopped diet with thin liquids with known assured aspiration.   This will continue to be an ongoing cycle with Steven David and I suspect that he will likely resume drinking thin liquids once he is discharged from acute care and returns to [facility]. Pt also expressed an interest in going to a nursing home closer to his family in [place] (his sister also expressed this interest on the phone yesterday, but stated that Pt did not want to move to [facility]). Perhaps this is something that can be explored further once Pt returns to [place]. It sounds as if he has not seen his family for quite a long time. Pt asked whether he could do exercises to help his swallowing and SLP stated that "we can try" as it has likely been tried in the past at SNF. Pt with decreased oral ROM and velar elevation- exercises initiated  with Pt this date. SLP further explained that his dysphagia is likely compounded by medications taken over the years for schizoaffective disorder etc and h/o stroke. Will continue diet as ordered, D2/chopped with NTL, however consideration of liberalizing to thin liquids should be considered if Pt can understand consequences.   MBSS was NOT completed during this admission due to h/o MBSS 2015, 2016, 2018 all with very similar results of severe oropharyngeal dysphagia and Pt thought not to be different at this time given clinical indicators. MBSS can be completed if this helps decision making progress.    HPI HPI: 62 year old male with a history of dysarthria, dysphasia, dysphagia, schizoaffective disorder, seizure disorder, hyperlipidemia, hypothyroidism presenting from his nursing facility with fever and low blood pressure.  The patient is a very difficult historian secondary to his severe dysarthria.  Furthermore, he is a poor historian at baseline.  The patient was recently admitted to the hospital from 02/15/17 through 02/16/17 for HCAP.  He was discharged with Amox/clav.  According to the medical records, the patient is on mechanical soft diet with thin liquids at his nursing facility.  His gastrostomy tube is used only for medications.  The review of his medical records, it appears that the patient has severe dysphagia and was previously recommended to be n.p.o. status with all feeding/medications to be going through PEG tube.  Patient continued to eat and drink.  He accepted the risks of aspiration and subsequent pneumonia.  This was addressed again with the patient during his last hospita admission 1 month ago,  and the patient wanted to continue eating despite the risks of aspiration.  Upon presentation, the patient had a fever up to 102.0 F and soft blood pressures with tachycardia.  The patient was started on vancomycin and Zosyn. BSE requested.       SLP Plan  Continue with current plan of care        Recommendations  Diet recommendations: Dysphagia 2 (fine chop);Nectar-thick liquid Liquids provided via: Cup;Straw Medication Administration: Via alternative means Supervision: Staff to assist with self feeding;Full supervision/cueing for compensatory strategies Compensations: Slow rate;Clear throat intermittently Postural Changes and/or Swallow Maneuvers: Seated upright 90 degrees;Upright 30-60 min after meal                Oral Care Recommendations: Oral care before and after PO;Staff/trained caregiver to provide oral care Follow up Recommendations: 24 hour supervision/assistance SLP Visit Diagnosis: Dysphagia, oropharyngeal phase (R13.12) Plan: Continue with current plan of care       Thank you,  Genene Churn, [place]                 [place] 03/14/2017, 10:36 AM

## 2017-03-14 NOTE — NC FL2 (Signed)
[place] LEVEL OF CARE SCREENING TOOL     IDENTIFICATION  Patient Name: Steven David Birthdate: 09-Jan-1956 Sex: male Admission Date (Current Location): 03/09/2017  [facility] and [place] Number:  [employer] and Address:  [place] [address], [place]      Provider Number: [id number]  Attending Physician Name and Address:  Isaac Bliss, [place]  Relative Name and Phone Number:       Current Level of Care: Hospital Recommended Level of Care: [place] Prior Approval Number:    Date Approved/Denied:   PASRR Number:    Discharge Plan: SNF    Current Diagnoses: Patient Active Problem List   Diagnosis Date Noted  . Goals of care, counseling/discussion   . Palliative care by specialist   . DNR (do not resuscitate) discussion   . Acute respiratory failure with hypoxia ([place]) 03/12/2017  . Aspiration pneumonia of both lower lobes due to gastric secretions ([place]) 03/10/2017  . Unilateral recurrent inguinal hernia without obstruction or gangrene   . Left inguinal hernia 02/15/2017  . Dysarthria 02/15/2017  . Malfunction of percutaneous endoscopic gastrostomy (PEG) tube ([place])   . Malnutrition of moderate degree 11/20/2014  . HCAP (healthcare-associated pneumonia) 11/19/2014  . Postoperative anemia due to acute blood loss 09/07/2014  . Seizure ([place])   . Incarcerated inguinal hernia 09/01/2014  . Respiratory failure, acute ([place]) 09/01/2014  . Metabolic acidosis 14/78/2956  . Incarcerated hernia 09/01/2014  . Septic shock ([place]) 09/01/2014  . AKI (acute kidney injury) ([place])   . Protein-calorie malnutrition, severe ([place]) 04/13/2013  . Sepsis ([place]) 04/11/2013  . H/O stroke within last year 04/11/2013  . Hypothyroidism 04/11/2013  . Pneumonia 04/11/2013  . Thrombocytopenia ([place]) 04/11/2013  . Schizophrenia ([place]) 04/11/2013  . Malnutrition ([place]) 04/11/2013  . SIRS (systemic inflammatory response  syndrome) (HCC) 04/11/2013    Orientation RESPIRATION BLADDER Height & Weight     Self, Place, Situation  Normal Incontinent Weight: 149 lb 4 oz (67.7 kg) Height:  5\' 9"  (175.3 cm)  BEHAVIORAL SYMPTOMS/MOOD NEUROLOGICAL BOWEL NUTRITION STATUS      Colostomy Diet, Feeding tube(see discharge summary)  AMBULATORY STATUS COMMUNICATION OF NEEDS Skin   Limited Assist Verbally Other (Comment)(Non Pressure sacrum)                       Personal Care Assistance Level of Assistance  Bathing, Feeding, Dressing Bathing Assistance: Limited assistance Feeding assistance: Independent Dressing Assistance: Limited assistance     Functional Limitations Info  Sight, Hearing, Speech Sight Info: Adequate Hearing Info: Adequate Speech Info: Impaired    SPECIAL CARE FACTORS FREQUENCY                       Contractures Contractures Info: Not present    Additional Factors Info  Code Status, Allergies, Psychotropic Code Status Info: DNR Allergies Info: NKA Psychotropic Info: Depakote, Haldol         Current Medications (03/14/2017):  This is the current hospital active medication list Current Facility-Administered Medications  Medication Dose Route Frequency Provider Last Rate Last Dose  . acetaminophen (TYLENOL) solution 650 mg  650 mg Oral Q6H PRN Orson Eva, MD   650 mg at 03/12/17 1959  . cholecalciferol (VITAMIN D) tablet 1,000 Units  1,000 Units Oral Daily Manuella Ghazi, Pratik D, DO   1,000 Units at 03/14/17 1021  . divalproex (DEPAKOTE SPRINKLE) capsule 625 mg  625 mg Oral BID Manuella Ghazi,  Pratik D, DO   625 mg at 03/14/17 1020  . ferrous sulfate tablet 325 mg  325 mg Oral Q breakfast Heath Lark D, DO   325 mg at 03/14/17 0802  . guaiFENesin (ROBITUSSIN) 100 MG/5ML solution 400 mg  20 mL Oral BID Tat, David, MD   400 mg at 03/14/17 1020  . haloperidol (HALDOL) tablet 10 mg  10 mg Oral QHS Shah, Pratik D, DO   10 mg at 03/13/17 2100  . ipratropium-albuterol (DUONEB) 0.5-2.5 (3) MG/3ML  nebulizer solution 3 mL  3 mL Nebulization Q6H PRN Tat, David, MD      . levETIRAcetam (KEPPRA) tablet 250 mg  250 mg Oral BID Manuella Ghazi, Pratik D, DO   250 mg at 03/14/17 1021  . levothyroxine (SYNTHROID, LEVOTHROID) tablet 100 mcg  100 mcg Oral QAC breakfast Heath Lark D, DO   100 mcg at 03/14/17 0802  . loratadine (CLARITIN) tablet 10 mg  10 mg Oral Daily Tat, David, MD   10 mg at 03/14/17 1020  . MEDLINE mouth rinse  15 mL Mouth Rinse BID Tat, David, MD   15 mL at 03/14/17 1022  . OLANZapine zydis (ZYPREXA) disintegrating tablet 5 mg  5 mg Oral QHS Shah, Pratik D, DO   5 mg at 03/13/17 2100  . ondansetron (ZOFRAN) tablet 4 mg  4 mg Oral Q6H PRN Manuella Ghazi, Pratik D, DO       Or  . ondansetron (ZOFRAN) injection 4 mg  4 mg Intravenous Q6H PRN Manuella Ghazi, Pratik D, DO      . oxybutynin (DITROPAN) tablet 5 mg  5 mg Oral BID Manuella Ghazi, Pratik D, DO   5 mg at 03/14/17 1020  . piperacillin-tazobactam (ZOSYN) IVPB 3.375 g  3.375 g Intravenous Q8H Pattricia Boss, MD   Stopped at 03/14/17 [phone]  . polyethylene glycol (MIRALAX / GLYCOLAX) packet 17 g  17 g Oral Daily Manuella Ghazi, Pratik D, DO   17 g at 03/14/17 1020  . pravastatin (PRAVACHOL) tablet 10 mg  10 mg Oral q1800 Manuella Ghazi, Pratik D, DO   10 mg at 03/13/17 1724  . RESOURCE THICKENUP CLEAR   Oral PRN Tat, Shanon Brow, MD      . vitamin C (ASCORBIC ACID) tablet 500 mg  500 mg Oral BID Manuella Ghazi, Pratik D, DO   500 mg at 03/14/17 1021     Discharge Medications: Please see discharge summary for a list of discharge medications.  Relevant Imaging Results:  Relevant Lab Results:   Additional Information    Delsie Amador, Clydene Pugh, LCSW

## 2017-03-14 NOTE — Progress Notes (Signed)
PROGRESS NOTE    Steven David  YIF:[id number] DOB: 10-01-1955 DOA: 03/09/2017 PCP: Hilbert Corrigan, MD     Brief Narrative:  62 year old man admitted from skilled nursing on 3/1 with fever and hypotension.  He has a history of dysarthria, dysphagia, schizoaffective disorder and seizure disorder.  Very difficult historian given his severe dysarthria and also poor historian at baseline.  He has multiple hospitalizations for hospital-acquired pneumonia/aspiration pneumonia.  Recently discharged on 02/16/17 with Augmentin.  He has a PEG tube but according to SNF is constantly eating food from other patients and taking drinks out of the vending machine.  Patient was initially started on vancomycin and Zosyn, ultimately vancomycin was discontinued.   Assessment & Plan:   Principal Problem:   Sepsis ([place]) Active Problems:   Hypothyroidism   Pneumonia   Thrombocytopenia ([place])   Schizophrenia ([place])   Seizure ([place])   Left inguinal hernia   Unilateral recurrent inguinal hernia without obstruction or gangrene   Aspiration pneumonia of both lower lobes due to gastric secretions (HCC)   Acute respiratory failure with hypoxia (HCC)   Goals of care, counseling/discussion   Palliative care by specialist   DNR (do not resuscitate) discussion   Sepsis -Secondary to aspiration pneumonia. -He remains a bit hypotensive with systolic blood pressures between 70 and 90 continue IV fluids, keep in stepdown unit today. -Culture data remains negative, afebrile past 24 hours. -Respiratory virus panel is positive for Metapneumovirus. -Influenza PCR was negative. -Appreciate diet recommendations as by speech therapy.  Acute hypoxemic respiratory failure -Likely due to recurrent aspiration in the setting of metapneumovirus respiratory infection. -Continue oxygen as needed to keep sats above 90%.  Hypothyroidism -Continue Synthroid  Schizoaffective disorder -Continue Haldol, Depakote,  Zyprexa  Seizure disorder -Continue Keppra  Hyperlipidemia -Continue statin  Left hydrocele/inguinal hernia -Is reducible, stable without pain  Thrombocytopenia -Platelet count is chronically in the 40s-50s, suspect due to psychiatric medications, continue to follow, avoid heparin products.    DVT prophylaxis: SCDs Code Status: DNR Family Communication: Patient only Disposition Plan: Keep in stepdown unit today given residual mild hypotension  Consultants:   None  Procedures:   None  Antimicrobials:  Anti-infectives (From admission, onward)   Start     Dose/Rate Route Frequency Ordered Stop   03/10/17 0900  vancomycin (VANCOCIN) 500 mg in sodium chloride 0.9 % 100 mL IVPB  Status:  Discontinued     500 mg 100 mL/hr over 60 Minutes Intravenous Every 12 hours 03/09/17 2157 03/11/17 1354   03/10/17 0600  piperacillin-tazobactam (ZOSYN) IVPB 3.375 g     3.375 g 12.5 mL/hr over 240 Minutes Intravenous Every 8 hours 03/09/17 2157     03/09/17 2145  piperacillin-tazobactam (ZOSYN) IVPB 3.375 g     3.375 g 100 mL/hr over 30 Minutes Intravenous  Once 03/09/17 2140 03/09/17 2253   03/09/17 2145  vancomycin (VANCOCIN) IVPB 1000 mg/200 mL premix  Status:  Discontinued     1,000 mg 200 mL/hr over 60 Minutes Intravenous  Once 03/09/17 2140 03/10/17 0225       Subjective: Lying in bed, voices no complaints, per nursing staff no acute issues.  Objective: Vitals:   03/14/17 0600 03/14/17 0700 03/14/17 0732 03/14/17 1114  BP: (!) 74/40 (!) 76/52    Pulse: 84 67 76 75  Resp: 15 15 11 13   Temp:   98.2 F (36.8 C) 98.1 F (36.7 C)  TempSrc:   Axillary Oral  SpO2: 95% 99% 96% 97%  Weight:  Height:        Intake/Output Summary (Last 24 hours) at 03/14/2017 1118 Last data filed at 03/14/2017 0824 Gross per 24 hour  Intake 890 ml  Output 3000 ml  Net -2110 ml   Filed Weights   03/12/17 0500 03/13/17 0500 03/14/17 0500  Weight: 69.3 kg (152 lb 12.5 oz) 70 kg (154 lb  5.2 oz) 67.7 kg (149 lb 4 oz)    Examination:  General exam: Awake, not verbalizing today Respiratory system: Clear to auscultation. Respiratory effort normal. Cardiovascular system:RRR. No murmurs, rubs, gallops. Gastrointestinal system: Abdomen is nondistended, soft and nontender. No organomegaly or masses felt. Normal bowel sounds heard. Central nervous system: Moves all 4 spontaneously Extremities: No C/C/E, +pedal pulses Skin: No rashes, lesions or ulcers Psychiatry: Unable to assess    Data Reviewed: I have personally reviewed following labs and imaging studies  CBC: Recent Labs  Lab 03/09/17 2155 03/10/17 0427 03/11/17 0527 03/13/17 0403 03/14/17 0413  WBC 8.4 7.5 4.5 6.0 6.2  NEUTROABS 6.4  --   --   --   --   HGB 12.5* 11.4* 10.1* 10.8* 10.1*  HCT 40.5 36.5* 33.6* 35.4* 33.7*  MCV 90.6 89.9 91.6 91.7 91.3  PLT 74* 75* 60* 48* 49*   Basic Metabolic Panel: Recent Labs  Lab 03/09/17 2155 03/10/17 0427 03/11/17 0527 03/13/17 0403  NA 139 140 142 141  K 4.1 4.0 3.8 4.5  CL 100* 107 109 104  CO2 26 24 25 28   GLUCOSE 77 92 126* 82  BUN 22* 15 14 13   CREATININE 1.00 0.86 0.98 0.91  CALCIUM 9.3 8.3* 7.8* 7.8*  MG  --   --  1.7  --    GFR: Estimated Creatinine Clearance: 82.7 mL/min (by C-G formula based on SCr of 0.91 mg/dL). Liver Function Tests: Recent Labs  Lab 03/09/17 2155 03/10/17 0427 03/13/17 0403  AST 20 21 26   ALT 13* 10* 13*  ALKPHOS 68 56 42  BILITOT 0.2* 0.4 0.3  PROT 8.8* 7.0 6.3*  ALBUMIN 4.0 3.1* 2.5*   No results for input(s): LIPASE, AMYLASE in the last 168 hours. No results for input(s): AMMONIA in the last 168 hours. Coagulation Profile: Recent Labs  Lab 03/14/17 0413  INR 0.99   Cardiac Enzymes: No results for input(s): CKTOTAL, CKMB, CKMBINDEX, TROPONINI in the last 168 hours. BNP (last 3 results) No results for input(s): PROBNP in the last 8760 hours. HbA1C: No results for input(s): HGBA1C in the last 72  hours. CBG: Recent Labs  Lab 03/11/17 0740 03/12/17 0747 03/12/17 0748 03/13/17 0813 03/14/17 0730  GLUCAP 105* 62* 73 97 76   Lipid Profile: No results for input(s): CHOL, HDL, LDLCALC, TRIG, CHOLHDL, LDLDIRECT in the last 72 hours. Thyroid Function Tests: No results for input(s): TSH, T4TOTAL, FREET4, T3FREE, THYROIDAB in the last 72 hours. Anemia Panel: No results for input(s): VITAMINB12, FOLATE, FERRITIN, TIBC, IRON, RETICCTPCT in the last 72 hours. Urine analysis:    Component Value Date/Time   COLORURINE YELLOW 03/12/2017 1451   APPEARANCEUR CLEAR 03/12/2017 1451   LABSPEC 1.019 03/12/2017 1451   PHURINE 5.0 03/12/2017 1451   GLUCOSEU NEGATIVE 03/12/2017 1451   HGBUR NEGATIVE 03/12/2017 1451   BILIRUBINUR NEGATIVE 03/12/2017 1451   KETONESUR NEGATIVE 03/12/2017 1451   PROTEINUR NEGATIVE 03/12/2017 1451   UROBILINOGEN 1.0 11/19/2014 0152   NITRITE NEGATIVE 03/12/2017 1451   LEUKOCYTESUR NEGATIVE 03/12/2017 1451   Sepsis Labs: @LABRCNTIP (procalcitonin:4,lacticidven:4)  ) Recent Results (from the past 240 hour(s))  Blood  Culture (routine x 2)     Status: None   Collection Time: 03/09/17  9:55 PM  Result Value Ref Range Status   Specimen Description RIGHT ANTECUBITAL  Final   Special Requests   Final    BOTTLES DRAWN AEROBIC AND ANAEROBIC Blood Culture adequate volume DRAWN BY RN   Culture   Final    NO GROWTH 5 DAYS Performed at [facility], [address] [place], [place] [address]    Report Status 03/14/2017 FINAL  Final  Blood Culture (routine x 2)     Status: None   Collection Time: 03/09/17 10:05 PM  Result Value Ref Range Status   Specimen Description BLOOD LEFT HAND  Final   Special Requests   Final    BOTTLES DRAWN AEROBIC ONLY Blood Culture results may not be optimal due to an inadequate volume of blood received in culture bottles   Culture   Final    NO GROWTH 5 DAYS Performed at [facility], [address], [place], [place] [address]     Report Status 03/14/2017 FINAL  Final  Culture, blood (Routine X 2) w Reflex to ID Panel     Status: None (Preliminary result)   Collection Time: 03/12/17  2:23 PM  Result Value Ref Range Status   Specimen Description BLOOD BLOOD RIGHT HAND  Final   Special Requests   Final    BOTTLES DRAWN AEROBIC AND ANAEROBIC Blood Culture adequate volume   Culture   Final    NO GROWTH 2 DAYS Performed at [facility], [address], [place], [place] [address]    Report Status PENDING  Incomplete  Respiratory Panel by PCR     Status: Abnormal   Collection Time: 03/12/17  2:23 PM  Result Value Ref Range Status   Adenovirus NOT DETECTED NOT DETECTED Final   Coronavirus 229E NOT DETECTED NOT DETECTED Final   Coronavirus HKU1 NOT DETECTED NOT DETECTED Final   Coronavirus NL63 NOT DETECTED NOT DETECTED Final   Coronavirus OC43 NOT DETECTED NOT DETECTED Final   Metapneumovirus DETECTED (A) NOT DETECTED Final   Rhinovirus / Enterovirus NOT DETECTED NOT DETECTED Final   Influenza A NOT DETECTED NOT DETECTED Final   Influenza A H1 NOT DETECTED NOT DETECTED Final   Influenza A H1 2009 NOT DETECTED NOT DETECTED Final   Influenza A H3 NOT DETECTED NOT DETECTED Final   Influenza B NOT DETECTED NOT DETECTED Final   Parainfluenza Virus 1 NOT DETECTED NOT DETECTED Final   Parainfluenza Virus 2 NOT DETECTED NOT DETECTED Final   Parainfluenza Virus 3 NOT DETECTED NOT DETECTED Final   Parainfluenza Virus 4 NOT DETECTED NOT DETECTED Final   Respiratory Syncytial Virus NOT DETECTED NOT DETECTED Final   Bordetella pertussis NOT DETECTED NOT DETECTED Final   Chlamydophila pneumoniae NOT DETECTED NOT DETECTED Final   Mycoplasma pneumoniae NOT DETECTED NOT DETECTED Final    Comment: Performed at [facility], [place] [address], [place], [place] [address]  Culture, blood (Routine X 2) w Reflex to ID Panel     Status: None (Preliminary result)   Collection Time: 03/12/17  2:34 PM  Result Value Ref Range Status    Specimen Description BLOOD BLOOD LEFT HAND  Final   Special Requests   Final    BOTTLES DRAWN AEROBIC AND ANAEROBIC Blood Culture adequate volume   Culture   Final    NO GROWTH 2 DAYS Performed at [facility], [address], [place],  [address]  Report Status PENDING  Incomplete  Culture, Urine     Status: None   Collection Time: 03/12/17  2:51 PM  Result Value Ref Range Status   Specimen Description   Final    URINE, CLEAN CATCH Performed at [facility], [address] [place], [place] [address]    Special Requests   Final    NONE Performed at [facility], [address] [place], [place] [address]    Culture   Final    NO GROWTH Performed at [facility], [place] [address], [place], [place] [address]    Report Status 03/14/2017 FINAL  Final         Radiology Studies: Dg Chest Port 1 View  Result Date: 03/12/2017 CLINICAL DATA:  Aspiration pneumonia EXAM: PORTABLE CHEST 1 VIEW COMPARISON:  03/10/2017 chest CT, 03/09/2017 plain film FINDINGS: Cardiac shadow is stable. Aortic calcifications are again seen. Persistent basilar infiltrate is noted on the left. The right lung base is incompletely evaluated but appears stable. No bony abnormality is seen. IMPRESSION: Stable left basilar infiltrate. Electronically Signed   By: Inez Catalina M.D.   On: 03/12/2017 16:29        Scheduled Meds: . cholecalciferol  1,000 Units Oral Daily  . divalproex  625 mg Oral BID  . ferrous sulfate  325 mg Oral Q breakfast  . guaiFENesin  20 mL Oral BID  . haloperidol  10 mg Oral QHS  . levETIRAcetam  250 mg Oral BID  . levothyroxine  100 mcg Oral QAC breakfast  . loratadine  10 mg Oral Daily  . mouth rinse  15 mL Mouth Rinse BID  . OLANZapine zydis  5 mg Oral QHS  . oxybutynin  5 mg Oral BID  . polyethylene glycol  17 g Oral Daily  . pravastatin  10 mg Oral q1800  . vitamin C  500 mg Oral BID   Continuous Infusions: . piperacillin-tazobactam (ZOSYN)  IV Stopped (03/14/17  0910)     LOS: 4 days    Time spent: 25 minutes. Greater than 50% of this time was spent in direct contact with the patient coordinating care.     Lelon Frohlich, MD [facility] Pager [phone]  If 7PM-7AM, please contact night-coverage [email or web address] Password [email or web address] 03/14/2017, 11:18 AM

## 2017-03-14 NOTE — Clinical Social Work Note (Signed)
Clinical Social Work Assessment  Patient Details  Name: Steven David MRN: [id number] Date of Birth: [birth year]  Date of referral:  03/13/17               Reason for consult:  Other (Comment Required)(From [facility])                Permission sought to share information with:    Permission granted to share information::     Name::        Agency::     Relationship::     Contact Information:  Tammy Blakley  Housing/Transportation Living arrangements for the past 2 months:  [facility] Information:  Patient, Facility Patient Interpreter Needed:  None Criminal Activity/Legal Involvement Pertinent to Current Situation/Hospitalization:  No - Comment as needed Significant Relationships:  Siblings Lives with:  Facility Resident Do you feel safe going back to the place where you live?  Yes Need for family participation in patient care:  Yes (Comment)  Care giving concerns:  None identified, facility resident.    Social Worker assessment / plan:  Patient is a long term resident at [facility] at [place]. Patient requires asssitance with ADLs and uses a wheelchair and can walk independently at baseline. Patient does not always get along with other residents in the facility. Patient is agreeable to return to the facility. Facility is agreeable to patient returning.   Employment status:  Disabled (Comment on whether or not currently receiving Disability) Insurance information:  Medicare, Medicaid In [place] PT Recommendations:  Not assessed at this time Information / Referral to community resources:     Patient/Family's Response to care:  Patient is agreeable to return to SNF.   Patient/Family's Understanding of and Emotional Response to Diagnosis, Current Treatment, and Prognosis:  Palliative care and SLP has spoke with family and have provided education on patient's diagnosis, treatment and prognosis.   Emotional Assessment Appearance:  Appears stated  age Attitude/Demeanor/Rapport:    Affect (typically observed):  Accepting, Calm Orientation:  Oriented to Self, Oriented to Place, Oriented to Situation Alcohol / Substance use:  Not Applicable Psych involvement (Current and /or in the community):  No (Comment)  Discharge Needs  Concerns to be addressed:  No discharge needs identified, Other (Comment Required(Return to [facility]) Readmission within the last 30 days:  No Current discharge risk:  None Barriers to Discharge:  No Barriers Identified   Ihor Gully, LCSW 03/14/2017, 10:32 AM

## 2017-03-15 LAB — GLUCOSE, CAPILLARY: Glucose-Capillary: 87 mg/dL (ref 65–99)

## 2017-03-15 MED ORDER — AMOXICILLIN-POT CLAVULANATE 500-125 MG PO TABS: 1.0000 | ORAL_TABLET | Freq: Three times a day (TID) | ORAL | 0 refills | Status: AC

## 2017-03-15 NOTE — Progress Notes (Signed)
Pharmacy Antibiotic Note  Steven David is a 62 y.o. male admitted on 03/09/2017 with sepsis and continued aspiration.  Pharmacy has been consulted for zosyn dosing.  Plan: Cont zosyn 3.375 gm IV q8 hours F/u renal function, cultures and clinical course  Height: 5\' 9"  (175.3 cm) Weight: 149 lb 14.6 oz (68 kg) IBW/kg (Calculated) : 70.7  Temp (24hrs), Avg:98.2 F (36.8 C), Min:98 F (36.7 C), Max:98.4 F (36.9 C)  Recent Labs  Lab 03/09/17 2155 03/09/17 2215 03/10/17 0023 03/10/17 0427 03/11/17 0527 03/13/17 0403 03/14/17 0413  WBC 8.4  --   --  7.5 4.5 6.0 6.2  CREATININE 1.00  --   --  0.86 0.98 0.91  --   LATICACIDVEN  --  0.49* 2.46* 0.7  --   --   --     Estimated Creatinine Clearance: 83 mL/min (by C-G formula based on SCr of 0.91 mg/dL).    No Known Allergies  Antimicrobials this admission: vanc 3/1 >>3/3  zosyn 3/1 >>   3/1  BC>>no growth 3/4 BC pending 3/4 UCx no growth  Thank you for allowing pharmacy to be a part of this patient's care.  Hart Robinsons A 03/15/2017 8:04 AM

## 2017-03-15 NOTE — Care Management Note (Signed)
Case Management Note  Patient Details  Name: Steven David MRN: [id number] Date of Birth: 08-29-1955   If discussed at [facility] Length of Stay Meetings, dates discussed:   03/15/2017 Additional Comments: Patient discharging back to [facility] today. Jazalynn Mireles, Chauncey Reading, RN 03/15/2017, 12:11 PM

## 2017-03-15 NOTE — Discharge Summary (Signed)
Physician Discharge Summary  Steven David QZE:[id number] DOB: 07-24-1955 DOA: 03/09/2017  PCP: Hilbert Corrigan, MD  Admit date: 03/09/2017 Discharge date: 03/15/2017  Time spent: 45 minutes  Recommendations for Outpatient Follow-up:  -Be discharged back to SNF today. -Adhere as much as possible to a dysphagia 2 diet with  nectar thick liquids. -Continue Augmentin for an additional 5 days.  Discharge Diagnoses:  Principal Problem:   Sepsis ([place]) Active Problems:   Hypothyroidism   Pneumonia   Thrombocytopenia ([place])   Schizophrenia ([place])   Seizure ([place])   Left inguinal hernia   Unilateral recurrent inguinal hernia without obstruction or gangrene   Aspiration pneumonia of both lower lobes due to gastric secretions (HCC)   Acute respiratory failure with hypoxia (HCC)   Goals of care, counseling/discussion   Palliative care by specialist   DNR (do not resuscitate) discussion   Discharge Condition: Stable and improved  Filed Weights   03/13/17 0500 03/14/17 0500 03/15/17 0500  Weight: 70 kg (154 lb 5.2 oz) 67.7 kg (149 lb 4 oz) 68 kg (149 lb 14.6 oz)    History of present illness:  As per Dr. Manuella Ghazi on 3/2: Steven David is a 62 y.o. 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  The patient is a level 5 caveat due to his dysarthria and general inability to give a good history.  He appears to deny any significant pain at this time, but no other significant history could be obtained.   ED Course: His vital signs are stable, but he is slightly tachycardic and he is noted to be febrile with a temperature of 100.9 F.  He is having some chills and Reiger's and initial lactic acid was  0.49 and this subsequently increased to 2.46.  He was given a 30 cc/kg fluid bolus and started on Zosyn and vancomycin.  Chest x-ray demonstrates possible left-sided pneumonia and urine analysis is negative for any acute findings.  CT of the chest along with abdomen obtain later demonstrates what appears to be bilateral pneumonia left> right with no acute findings in the abdomen. Influenza swab is negative and blood cultures have been obtained.  He is noted to be thrombocytopenic with platelet count of 74,000, but this appears to be his usual baseline.    Hospital Course:   Sepsis -Secondary to aspiration pneumonia. -Sepsis parameters have resolved. -Culture data remains negative, afebrile past 48 hours. -Respiratory virus panel is positive for Metapneumovirus. -Influenza PCR was negative. -Appreciate diet recommendations as by speech therapy. -Will likely have recurrent episodes of aspiration PNA as he refuses to follow diet recommendations.  Acute hypoxemic respiratory failure -Likely due to recurrent aspiration in the setting of metapneumovirus respiratory infection. -Continue oxygen as needed to keep sats above 90%.  Hypothyroidism -Continue Synthroid  Schizoaffective disorder -Continue Haldol, Depakote, Zyprexa  Seizure disorder -Continue Keppra  Hyperlipidemia -Continue statin  Left hydrocele/inguinal hernia -Is reducible, stable without pain  Thrombocytopenia -Platelet count is chronically in the 40s-50s, suspect due to psychiatric medications, continue to follow, avoid heparin products.      Procedures:  None   Consultations:  Palliative Care  Discharge Instructions  Discharge Instructions    Increase activity slowly   Complete by:  As directed  Allergies as of 03/15/2017   No Known Allergies     Medication List    STOP taking these medications   aspirin 325 MG tablet   furosemide 20 MG tablet Commonly known as:  LASIX     TAKE  these medications   acetaminophen 325 MG tablet Commonly known as:  TYLENOL Place 650 mg into feeding tube every 6 (six) hours as needed for moderate pain or fever.   amoxicillin-clavulanate 500-125 MG tablet Commonly known as:  AUGMENTIN Take 1 tablet (500 mg total) by mouth 3 (three) times daily for 5 days.   cholecalciferol 1000 units tablet Commonly known as:  VITAMIN D Take 1,000 Units by mouth daily.   divalproex 125 MG capsule Commonly known as:  DEPAKOTE SPRINKLE Take 625 mg by mouth 2 (two) times daily.   feeding supplement (JEVITY 1.5 CAL/FIBER) Liqd Place 1,000 mLs into feeding tube continuous. Initiate nocturnal feedings of Jevity 1.5 @ 100 ml/hr via PEG over 14 hour period   ferrous sulfate 325 (65 FE) MG tablet 325 mg daily with breakfast. Per tube   guaifenesin 400 MG Tabs tablet Commonly known as:  HUMIBID E Take 400 mg by mouth 2 (two) times daily. Per tube   haloperidol 10 MG tablet Commonly known as:  HALDOL Take one (1) tablet by mouth at bedtime   haloperidol decanoate 50 MG/ML injection Commonly known as:  HALDOL DECANOATE Inject 100 mg into the muscle every 28 (twenty-eight) days.   levETIRAcetam 250 MG tablet Commonly known as:  KEPPRA Take 250 mg by mouth 2 (two) times daily. Per tube   levothyroxine 100 MCG tablet Commonly known as:  SYNTHROID, LEVOTHROID Take 100 mcg by mouth daily before breakfast.   loratadine 10 MG tablet Commonly known as:  CLARITIN Place 1 tablet (10 mg total) into feeding tube daily.   lovastatin 20 MG tablet Commonly known as:  MEVACOR Place 1 tablet (20 mg total) into feeding tube daily.   OLANZapine zydis 5 MG disintegrating tablet Commonly known as:  ZYPREXA Take 5 mg by mouth at bedtime.   oxybutynin 5 MG tablet Commonly known as:  DITROPAN Take 5 mg by mouth 2 (two) times daily.   polyethylene glycol packet Commonly known as:  MIRALAX / GLYCOLAX Take 17 g by mouth daily.   vitamin C 500 MG  tablet Commonly known as:  ASCORBIC ACID Take 500 mg by mouth 2 (two) times daily.      No Known Allergies    The results of significant diagnostics from this hospitalization (including imaging, microbiology, ancillary and laboratory) are listed below for reference.    Significant Diagnostic Studies: Ct Abdomen Pelvis W Wo Contrast  Result Date: 03/10/2017 CLINICAL DATA:  Fever today.  Scrotal swelling. EXAM: CT CHEST, ABDOMEN, AND PELVIS WITHOUT AND CT ABDOMEN AND PELVIS WITH CONTRAST TECHNIQUE: Multidetector CT imaging of the chest, abdomen and pelvis was performed following the standard protocol before bolus administration of intravenous contrast. CT abdomen and pelvis was then performed after bolus administration of intravenous contrast. CONTRAST:  100 ml ISOVUE-300 IOPAMIDOL (ISOVUE-300) INJECTION 61% COMPARISON:  Single-view of the chest 03/09/2017. CT abdomen and pelvis 09/01/2014. CT chest 05/01/2013. FINDINGS: CT CHEST FINDINGS Cardiovascular: Heart size is normal. No pleural or pericardial effusion. Aortic atherosclerosis noted. No aneurysm. Mediastinum/Nodes: 1.5 cm left precarinal node noted. No hiatal hernia. Thyroid appears normal. Lungs/Pleura: No pleural effusion. The lungs demonstrate emphysematous disease. Bilateral lower lobe airspace disease is worse on the left. Peribronchial thickening is seen in  the lower lobes bilaterally. No nodule or mass. Musculoskeletal: Small Schmorl's node in the superior endplate of T8 is noted. No fracture. No lytic or sclerotic lesion. CT ABDOMEN PELVIS FINDINGS Hepatobiliary: No focal liver abnormality is seen. No gallstones, gallbladder wall thickening, or biliary dilatation. Pancreas: Unremarkable. No pancreatic ductal dilatation or surrounding inflammatory changes. Spleen: Normal in size without focal abnormality. Adrenals/Urinary Tract: Adrenal glands are unremarkable. Kidneys are normal, without renal calculi, focal lesion, or hydronephrosis.  Bladder is unremarkable. Stomach/Bowel: Hartmann's pouch is identified. Colostomy in the left mid abdomen is identified. The colon is otherwise unremarkable. The appendix is not visualized but no evidence of appendicitis is seen. The small bowel appears normal. Feeding tube is in place in the stomach. Vascular/Lymphatic: Aortic atherosclerosis. No enlarged abdominal or pelvic lymph nodes. Reproductive: Prostate is unremarkable. Moderately large left hydrocele is chronic and seen on the 2016 exam. Other: The patient has a large left inguinal hernia containing loops of small and large bowel without complicating feature. The hernia extends into the scrotum. The appearance is unchanged since the prior CT. Musculoskeletal: No lytic or sclerotic lesion. Degenerative disc disease L5-S1 noted. IMPRESSION: Bilateral lower lobe pneumonia is worse on the left. Peribronchial thickening consistent with bronchitis is also identified. No other acute finding chest, abdomen or pelvis. Calcific aortic and coronary atherosclerosis. Emphysema. Large left inguinal hernia containing loops of small bowel extends into the scrotum. No bowel obstruction or other complicating feature. Moderate left hydrocele. 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 No complicating feature. Electronically Signed   By: Inge Rise M.D.   On: 03/10/2017 01:54   Dg Chest 2 View  Result Date: 02/15/2017 CLINICAL DATA:  Fever and cough EXAM: CHEST  2 VIEW COMPARISON:  June 20, 2016 FINDINGS: There is airspace consolidation in the left lower lobe consistent with pneumonia. Lungs elsewhere clear. Heart is upper normal in size with pulmonary vascularity within normal limits. There is aortic atherosclerosis. No adenopathy. No bone lesions. IMPRESSION: Left base airspace consolidation consistent with pneumonia. Lungs elsewhere clear. Heart upper normal in size. There is aortic atherosclerosis. Aortic  Atherosclerosis (ICD10-I70.0). Electronically Signed   By: Lowella Grip III M.D.   On: 02/15/2017 10:41   Ct Chest Wo Contrast  Result Date: 03/10/2017 CLINICAL DATA:  Fever today.  Scrotal swelling. EXAM: CT CHEST, ABDOMEN, AND PELVIS WITHOUT AND CT ABDOMEN AND PELVIS WITH CONTRAST TECHNIQUE: Multidetector CT imaging of the chest, abdomen and pelvis was performed following the standard protocol before bolus administration of intravenous contrast. CT abdomen and pelvis was then performed after bolus administration of intravenous contrast. CONTRAST:  100 ml ISOVUE-300 IOPAMIDOL (ISOVUE-300) INJECTION 61% COMPARISON:  Single-view of the chest 03/09/2017. CT abdomen and pelvis 09/01/2014. CT chest 05/01/2013. FINDINGS: CT CHEST FINDINGS Cardiovascular: Heart size is normal. No pleural or pericardial effusion. Aortic atherosclerosis noted. No aneurysm. Mediastinum/Nodes: 1.5 cm left precarinal node noted. No hiatal hernia. Thyroid appears normal. Lungs/Pleura: No pleural effusion. The lungs demonstrate emphysematous disease. Bilateral lower lobe airspace disease is worse on the left. Peribronchial thickening is seen in the lower lobes bilaterally. No nodule or mass. Musculoskeletal: Small Schmorl's node in the superior endplate of T8 is noted. No fracture. No lytic or sclerotic lesion. CT ABDOMEN PELVIS FINDINGS Hepatobiliary: No focal liver abnormality is seen. No gallstones, gallbladder wall thickening, or biliary dilatation. Pancreas: Unremarkable. No pancreatic ductal dilatation or surrounding inflammatory changes. Spleen: Normal in size without focal abnormality. Adrenals/Urinary Tract: Adrenal glands  are unremarkable. Kidneys are normal, without renal calculi, focal lesion, or hydronephrosis. Bladder is unremarkable. Stomach/Bowel: Hartmann's pouch is identified. Colostomy in the left mid abdomen is identified. The colon is otherwise unremarkable. The appendix is not visualized but no evidence of  appendicitis is seen. The small bowel appears normal. Feeding tube is in place in the stomach. Vascular/Lymphatic: Aortic atherosclerosis. No enlarged abdominal or pelvic lymph nodes. Reproductive: Prostate is unremarkable. Moderately large left hydrocele is chronic and seen on the 2016 exam. Other: The patient has a large left inguinal hernia containing loops of small and large bowel without complicating feature. The hernia extends into the scrotum. The appearance is unchanged since the prior CT. Musculoskeletal: No lytic or sclerotic lesion. Degenerative disc disease L5-S1 noted. IMPRESSION: Bilateral lower lobe pneumonia is worse on the left. Peribronchial thickening consistent with bronchitis is also identified. No other acute finding chest, abdomen or pelvis. Calcific aortic and coronary atherosclerosis. Emphysema. Large left inguinal hernia containing loops of small bowel extends into the scrotum. No bowel obstruction or other complicating feature. Moderate left hydrocele. 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 No complicating feature. Electronically Signed   By: Inge Rise M.D.   On: 03/10/2017 01:54   Dg Chest Port 1 View  Result Date: 03/12/2017 CLINICAL DATA:  Aspiration pneumonia EXAM: PORTABLE CHEST 1 VIEW COMPARISON:  03/10/2017 chest CT, 03/09/2017 plain film FINDINGS: Cardiac shadow is stable. Aortic calcifications are again seen. Persistent basilar infiltrate is noted on the left. The right lung base is incompletely evaluated but appears stable. No bony abnormality is seen. IMPRESSION: Stable left basilar infiltrate. Electronically Signed   By: Inez Catalina M.D.   On: 03/12/2017 16:29   Dg Chest Portable 1 View  Result Date: 03/09/2017 CLINICAL DATA:  Low blood pressure with fever and cough EXAM: PORTABLE CHEST 1 VIEW COMPARISON:  02/15/2017, 06/20/2016 FINDINGS: Persistent airspace disease at the left lung base. Streaky  atelectasis at the right base. No large effusion. Mild cardiomegaly with slight central vascular congestion. Aortic atherosclerosis. No pneumothorax. IMPRESSION: 1. Persistent airspace disease at the left base suspicious for pneumonia. Possible tiny left effusion 2. Mild cardiomegaly with central vascular congestion Electronically Signed   By: Donavan Foil M.D.   On: 03/09/2017 21:11    Microbiology: Recent Results (from the past 240 hour(s))  Blood Culture (routine x 2)     Status: None   Collection Time: 03/09/17  9:55 PM  Result Value Ref Range Status   Specimen Description RIGHT ANTECUBITAL  Final   Special Requests   Final    BOTTLES DRAWN AEROBIC AND ANAEROBIC Blood Culture adequate volume DRAWN BY RN   Culture   Final    NO GROWTH 5 DAYS Performed at [facility], [address] [place], [place] [address]    Report Status 03/14/2017 FINAL  Final  Blood Culture (routine x 2)     Status: None   Collection Time: 03/09/17 10:05 PM  Result Value Ref Range Status   Specimen Description BLOOD LEFT HAND  Final   Special Requests   Final    BOTTLES DRAWN AEROBIC ONLY Blood Culture results may not be optimal due to an inadequate volume of blood received in culture bottles   Culture   Final    NO GROWTH 5 DAYS Performed at [facility], [address], [place], [place] [address]    Report Status 03/14/2017 FINAL  Final  Culture, blood (Routine X 2)  w Reflex to ID Panel     Status: None (Preliminary result)   Collection Time: 03/12/17  2:23 PM  Result Value Ref Range Status   Specimen Description BLOOD BLOOD RIGHT HAND  Final   Special Requests   Final    BOTTLES DRAWN AEROBIC AND ANAEROBIC Blood Culture adequate volume   Culture   Final    NO GROWTH 3 DAYS Performed at [facility], [address] [place], [place] [address]    Report Status PENDING  Incomplete  Respiratory Panel by PCR     Status: Abnormal   Collection Time: 03/12/17  2:23 PM  Result Value Ref Range Status    Adenovirus NOT DETECTED NOT DETECTED Final   Coronavirus 229E NOT DETECTED NOT DETECTED Final   Coronavirus HKU1 NOT DETECTED NOT DETECTED Final   Coronavirus NL63 NOT DETECTED NOT DETECTED Final   Coronavirus OC43 NOT DETECTED NOT DETECTED Final   Metapneumovirus DETECTED (A) NOT DETECTED Final   Rhinovirus / Enterovirus NOT DETECTED NOT DETECTED Final   Influenza A NOT DETECTED NOT DETECTED Final   Influenza A H1 NOT DETECTED NOT DETECTED Final   Influenza A H1 2009 NOT DETECTED NOT DETECTED Final   Influenza A H3 NOT DETECTED NOT DETECTED Final   Influenza B NOT DETECTED NOT DETECTED Final   Parainfluenza Virus 1 NOT DETECTED NOT DETECTED Final   Parainfluenza Virus 2 NOT DETECTED NOT DETECTED Final   Parainfluenza Virus 3 NOT DETECTED NOT DETECTED Final   Parainfluenza Virus 4 NOT DETECTED NOT DETECTED Final   Respiratory Syncytial Virus NOT DETECTED NOT DETECTED Final   Bordetella pertussis NOT DETECTED NOT DETECTED Final   Chlamydophila pneumoniae NOT DETECTED NOT DETECTED Final   Mycoplasma pneumoniae NOT DETECTED NOT DETECTED Final    Comment: Performed at [facility], [place] [address] [place], [place] [address]  Culture, blood (Routine X 2) w Reflex to ID Panel     Status: None (Preliminary result)   Collection Time: 03/12/17  2:34 PM  Result Value Ref Range Status   Specimen Description BLOOD BLOOD LEFT HAND  Final   Special Requests   Final    BOTTLES DRAWN AEROBIC AND ANAEROBIC Blood Culture adequate volume   Culture   Final    NO GROWTH 3 DAYS Performed at [facility], [address], [place], [place] [address]    Report Status PENDING  Incomplete  Culture, Urine     Status: None   Collection Time: 03/12/17  2:51 PM  Result Value Ref Range Status   Specimen Description   Final    URINE, CLEAN CATCH Performed at [facility], [address] [place], [place] [address]    Special Requests   Final    NONE Performed at [facility], [address],  [place], [place] [address]    Culture   Final    NO GROWTH Performed at [facility], [place] [address], [place], [place] [address]    Report Status 03/14/2017 FINAL  Final     Labs: Basic Metabolic Panel: Recent Labs  Lab 03/09/17 2155 03/10/17 0427 03/11/17 0527 03/13/17 0403  NA 139 140 142 141  K 4.1 4.0 3.8 4.5  CL 100* 107 109 104  CO2 26 24 25 28   GLUCOSE 77 92 126* 82  BUN 22* 15 14 13   CREATININE 1.00 0.86 0.98 0.91  CALCIUM 9.3 8.3* 7.8* 7.8*  MG  --   --  1.7  --  Liver Function Tests: Recent Labs  Lab 03/09/17 2155 03/10/17 0427 03/13/17 0403  AST 20 21 26   ALT 13* 10* 13*  ALKPHOS 68 56 42  BILITOT 0.2* 0.4 0.3  PROT 8.8* 7.0 6.3*  ALBUMIN 4.0 3.1* 2.5*   No results for input(s): LIPASE, AMYLASE in the last 168 hours. No results for input(s): AMMONIA in the last 168 hours. CBC: Recent Labs  Lab 03/09/17 2155 03/10/17 0427 03/11/17 0527 03/13/17 0403 03/14/17 0413  WBC 8.4 7.5 4.5 6.0 6.2  NEUTROABS 6.4  --   --   --   --   HGB 12.5* 11.4* 10.1* 10.8* 10.1*  HCT 40.5 36.5* 33.6* 35.4* 33.7*  MCV 90.6 89.9 91.6 91.7 91.3  PLT 74* 75* 60* 48* 49*   Cardiac Enzymes: No results for input(s): CKTOTAL, CKMB, CKMBINDEX, TROPONINI in the last 168 hours. BNP: BNP (last 3 results) No results for input(s): BNP in the last 8760 hours.  ProBNP (last 3 results) No results for input(s): PROBNP in the last 8760 hours.  CBG: Recent Labs  Lab 03/12/17 0747 03/12/17 0748 03/13/17 0813 03/14/17 0730 03/15/17 0743  GLUCAP 62* 73 97 76 87       Signed:  [facility] Pager: [phone] 03/15/2017, 10:20 AM

## 2017-03-15 NOTE — Progress Notes (Signed)
Spoke with April at [facility] about patient needing to be transported back to [facility].

## 2017-03-15 NOTE — Progress Notes (Signed)
Report given to Nurse at [facility].

## 2017-03-15 NOTE — Care Management Important Message (Signed)
Important Message  Patient Details  Name: KASIM MCCORKLE MRN: [id number] Date of Birth: 1955/10/14   Medicare Important Message Given:  Yes    Aj Crunkleton, Chauncey Reading, RN 03/15/2017, 10:10 AM

## 2017-03-17 LAB — CULTURE, BLOOD (ROUTINE X 2)
CULTURE: NO GROWTH
Culture: NO GROWTH
SPECIAL REQUESTS: ADEQUATE
Special Requests: ADEQUATE

## 2017-04-30 ENCOUNTER — Encounter (HOSPITAL_COMMUNITY): Payer: Self-pay | Admitting: Emergency Medicine

## 2017-04-30 ENCOUNTER — Emergency Department (HOSPITAL_COMMUNITY)
Admission: EM | Admit: 2017-04-30 | Discharge: 2017-04-30 | Disposition: A | Discharge status: Home / Self Care | Payer: Medicare Other | Attending: Emergency Medicine | Admitting: Emergency Medicine

## 2017-04-30 ENCOUNTER — Emergency Department (HOSPITAL_COMMUNITY): Payer: Medicare Other

## 2017-04-30 ENCOUNTER — Other Ambulatory Visit: Payer: Self-pay

## 2017-04-30 DIAGNOSIS — Z66 Do not resuscitate: Secondary | ICD-10-CM | POA: Insufficient documentation

## 2017-04-30 DIAGNOSIS — F17210 Nicotine dependence, cigarettes, uncomplicated: Secondary | ICD-10-CM | POA: Diagnosis not present

## 2017-04-30 DIAGNOSIS — Z933 Colostomy status: Secondary | ICD-10-CM | POA: Insufficient documentation

## 2017-04-30 DIAGNOSIS — Z79899 Other long term (current) drug therapy: Secondary | ICD-10-CM | POA: Diagnosis not present

## 2017-04-30 DIAGNOSIS — Z0189 Encounter for other specified special examinations: Secondary | ICD-10-CM

## 2017-04-30 DIAGNOSIS — Z4659 Encounter for fitting and adjustment of other gastrointestinal appliance and device: Secondary | ICD-10-CM | POA: Insufficient documentation

## 2017-04-30 DIAGNOSIS — Z8673 Personal history of transient ischemic attack (TIA), and cerebral infarction without residual deficits: Secondary | ICD-10-CM | POA: Diagnosis not present

## 2017-04-30 DIAGNOSIS — E039 Hypothyroidism, unspecified: Secondary | ICD-10-CM | POA: Insufficient documentation

## 2017-04-30 MED ORDER — IOPAMIDOL (ISOVUE-300) INJECTION 61%
INTRAVENOUS | Status: AC
  Administered 2017-04-30: 100 mL
  Filled 2017-04-30: qty 200

## 2017-04-30 NOTE — Discharge Instructions (Addendum)
Your evaluated in the emergency department for replacement of the G-tube that fell out.  He had an x-ray that showed its in good position and you may continue to use that tube.

## 2017-04-30 NOTE — ED Notes (Signed)
Patient transported to X-ray 

## 2017-04-30 NOTE — ED Provider Notes (Signed)
[facility] EMERGENCY DEPARTMENT Provider Note   CSN: [id number] Arrival date & time: 04/30/17  1353     History   Chief Complaint Chief Complaint  Patient presents with  . Feeding tube replace    HPI Steven David is a 62 y.o. male.  Patient was sent in from his nursing facility for replacement of G-tube that fallen out.  Patient denies that he pulled it out and said it just came out when he was coughing.  Patient has some baseline cognitive and psychiatric issues with dysarthric speech and is difficult to understand.  On review of prior notes it sounds like this is baseline.  Denies any complaints and he states he feels well and does not understand why we need to put the tube back in.  The history is provided by the patient and the nursing home. The history is limited by a language barrier and a developmental delay.    Past Medical History:  Diagnosis Date  . Aggressive outburst   . Cognitive communication deficit   . Colostomy in place [facility])   . Drug-induced Parkinsonism ([place])   . Dysphagia   . Dysphagia   . Hard of hearing    Wears bilateral hearing aides  . Hernia   . High cholesterol   . HOH (hard of hearing)   . Malnutrition ([place])   . Malnutrition ([place])   . Schizoaffective disorder ([place])   . Schizophrenia ([place])   . Seizures ([place])   . Thyroid disease   . TIA (transient ischemic attack)     Patient Active Problem List   Diagnosis Date Noted  . Goals of care, counseling/discussion   . Palliative care by specialist   . DNR (do not resuscitate) discussion   . Acute respiratory failure with hypoxia ([place]) 03/12/2017  . Aspiration pneumonia of both lower lobes due to gastric secretions ([place]) 03/10/2017  . Unilateral recurrent inguinal hernia without obstruction or gangrene   . Left inguinal hernia 02/15/2017  . Dysarthria 02/15/2017  . Malfunction of percutaneous endoscopic gastrostomy (PEG) tube ([place])   . Malnutrition of moderate degree 11/20/2014  . HCAP  (healthcare-associated pneumonia) 11/19/2014  . Postoperative anemia due to acute blood loss 09/07/2014  . Seizure ([place])   . Incarcerated inguinal hernia 09/01/2014  . Respiratory failure, acute ([place]) 09/01/2014  . Metabolic acidosis 81/01/7508  . Incarcerated hernia 09/01/2014  . Septic shock ([place]) 09/01/2014  . AKI (acute kidney injury) ([place])   . Protein-calorie malnutrition, severe ([place]) 04/13/2013  . Sepsis ([place]) 04/11/2013  . H/O stroke within last year 04/11/2013  . Hypothyroidism 04/11/2013  . Pneumonia 04/11/2013  . Thrombocytopenia ([place]) 04/11/2013  . Schizophrenia ([place]) 04/11/2013  . Malnutrition ([employer]) 04/11/2013  . SIRS (systemic inflammatory response syndrome) ([place]) 04/11/2013    Past Surgical History:  Procedure Laterality Date  . ESOPHAGOGASTRODUODENOSCOPY (EGD) WITH PROPOFOL N/A 09/08/2014   Procedure: ESOPHAGOGASTRODUODENOSCOPY (EGD) WITH PROPOFOL;  Surgeon: Georganna Skeans, MD;  Location: [employer];  Service: Endoscopy;  Laterality: N/A;  . LAPAROTOMY N/A 09/01/2014   Procedure: EXPLORATORY LAPAROTOMY, SIGMOID COLECTOMY, SIGMOID COLOSTOMY, PRIMARY REPAIR OF STRANGULATED INGUINAL HERNIA.;  Surgeon: Rolm Bookbinder, MD;  Location: [place];  Service: General;  Laterality: N/A;  . LEG SURGERY    . PEG PLACEMENT N/A 09/08/2014   Procedure: PERCUTANEOUS ENDOSCOPIC GASTROSTOMY (PEG) PLACEMENT;  Surgeon: Georganna Skeans, MD;  Location: [place];  Service: Endoscopy;  Laterality: N/A;  . PEG PLACEMENT N/A 06/30/2015   Procedure: PERCUTANEOUS ENDOSCOPIC GASTROSTOMY (PEG) REPLACEMENT;  Surgeon: Herbie Baltimore  Hilton Cork, MD;  [facility];  Service: Endoscopy;  Laterality: N/A;  1100  . PEG PLACEMENT N/A 12/30/2015   Procedure: PERCUTANEOUS ENDOSCOPIC GASTROSTOMY (PEG) REPLACEMENT;  Surgeon: Daneil Dolin, MD;  [facility];  Service: Endoscopy;  Laterality: N/A;  . PEG PLACEMENT N/A 08/24/2016   Procedure: PERCUTANEOUS ENDOSCOPIC GASTROSTOMY (PEG) REPLACEMENT;   Surgeon: Daneil Dolin, MD;  [facility];  Service: Endoscopy;  Laterality: N/A;  . TESTICLE SURGERY          Home Medications    Prior to Admission medications   Medication Sig Start Date End Date Taking? Authorizing Provider  acetaminophen (TYLENOL) 325 MG tablet Place 650 mg into feeding tube every 6 (six) hours as needed for moderate pain or fever.    [provider]  cholecalciferol (VITAMIN D) 1000 units tablet Take 1,000 Units by mouth daily.    [provider]  divalproex (DEPAKOTE SPRINKLE) 125 MG capsule Take 625 mg by mouth 2 (two) times daily.    [provider]  ferrous sulfate 325 (65 FE) MG tablet 325 mg daily with breakfast. Per tube    [provider]  guaifenesin (HUMIBID E) 400 MG TABS tablet Take 400 mg by mouth 2 (two) times daily. Per tube     [provider]  haloperidol (HALDOL) 10 MG tablet Take one (1) tablet by mouth at bedtime 11/14/16 03/13/17  [provider]  haloperidol decanoate (HALDOL DECANOATE) 50 MG/ML injection Inject 100 mg into the muscle every 28 (twenty-eight) days.    [provider]  levETIRAcetam (KEPPRA) 250 MG tablet Take 250 mg by mouth 2 (two) times daily. Per tube    [provider]  levothyroxine (SYNTHROID, LEVOTHROID) 100 MCG tablet Take 100 mcg by mouth daily before breakfast.    [provider]  loratadine (CLARITIN) 10 MG tablet Place 1 tablet (10 mg total) into feeding tube daily. 09/10/14   Nat Christen, PA-C  lovastatin (MEVACOR) 20 MG tablet Place 1 tablet (20 mg total) into feeding tube daily. 09/10/14   Nat Christen, PA-C  Nutritional Supplements (FEEDING SUPPLEMENT, JEVITY 1.5 CAL/FIBER,) LIQD Place 1,000 mLs into feeding tube continuous. Initiate nocturnal feedings of Jevity 1.5 @ 100 ml/hr via PEG over 14 hour period 09/10/14   Nat Christen, PA-C  OLANZapine zydis (ZYPREXA) 5 MG disintegrating tablet Take 5 mg by mouth at bedtime.     [provider]  oxybutynin (DITROPAN) 5 MG tablet Take 5 mg by mouth 2 (two) times daily.    [provider]  polyethylene glycol (MIRALAX / GLYCOLAX) packet Take 17 g by mouth daily.    [provider]  vitamin C (ASCORBIC ACID) 500 MG tablet Take 500 mg by mouth 2 (two) times daily.     [provider]    Family History Family History  Family history unknown: Yes    Social History Social History   Tobacco Use  . Smoking status: Current Every Day Smoker  . Smokeless tobacco: Never Used  Substance Use Topics  . Alcohol use: No  . Drug use: No     Allergies   Patient has no known allergies.   Review of Systems Review of Systems  Constitutional: Negative for fever.  HENT: Negative for sore throat.   Respiratory: Negative for shortness of breath.   Cardiovascular: Negative for chest pain.  Gastrointestinal: Negative for abdominal pain.  Genitourinary: Negative for dysuria.  Skin: Negative for rash.  Physical Exam Updated Vital Signs BP 110/68 (BP Location: Left Arm)   Pulse 78   Temp 98 F (36.7 C) (Oral)   Resp 16   Ht 5\' 8"  (1.727 m)   Wt 68 kg (150 lb)   SpO2 100%   BMI 22.81 kg/m   Physical Exam  Constitutional: He appears well-developed and well-nourished.  HENT:  Head: Normocephalic and atraumatic.  Eyes: Conjunctivae are normal.  Neck: Neck supple.  Cardiovascular: Normal rate and regular rhythm.  Pulmonary/Chest: Effort normal.  Abdominal: Soft. There is no tenderness.  Patient has a colostomy bag with dark stool in bag.  He has ostomy site for his G-tube that has no surrounding erythema or bleeding.  Neurological: He is alert. GCS eye subscore is 4. GCS verbal subscore is 5. GCS motor subscore is 6.  Patient has disconjugate gaze and dysarthric speech.  Skin: Skin is warm and dry.  Psychiatric: He has a normal mood and affect.  Nursing note and vitals reviewed.    ED Treatments / Results  Labs (all  labs ordered are listed, but only abnormal results are displayed) Labs Reviewed - No data to display  EKG None  Radiology Dg Fluoro Rm 1-60 Min  Result Date: 04/30/2017 CLINICAL DATA:  Replaced gastrostomy tube EXAM: FLOURO RM 1-60 MIN CONTRAST:  50 cc Isovue-300 IV FLUOROSCOPY TIME:  Fluoroscopy Time:  0 minutes 24 seconds Radiation Exposure Index (if provided by the fluoroscopic device): 1.6 mGy Number of Acquired Spot Images: 2 screen capture during fluoroscopy COMPARISON:  08/24/2016 FINDINGS: With contrast injection, gastric lumen is opacified indicating intragastric position of the gastrostomy tube. Contrast passes beyond the pylorus into the descending duodenum without evidence of gastric outlet obstruction. No contrast extravasation identified. Tubing was flushed with water at the conclusion of the procedure. IMPRESSION: Replaced gastrostomy tube is located within the gastric lumen without contrast extravasation. Electronically Signed   By: Lavonia Dana M.D.   On: 04/30/2017 15:23    Procedures Gastrostomy tube replacement Date/Time: 04/30/2017 2:21 PM Performed by: Hayden Rasmussen, MD Authorized by: Hayden Rasmussen, MD  Consent: Verbal consent obtained. Risks and benefits: risks, benefits and alternatives were discussed Consent given by: patient Patient understanding: patient states understanding of the procedure being performed Patient consent: the patient's understanding of the procedure matches consent given Procedure consent: procedure consent matches procedure scheduled Patient identity confirmed: verbally with patient Local anesthesia used: no  Anesthesia: Local anesthesia used: no  Sedation: Patient sedated: no  Patient tolerance: Patient tolerated the procedure well with no immediate complications Comments: 20 French feeding tube was replaced.  Plan inflated and tube secured.  Check x-ray to confirm intraluminal placement.    (including critical care  time)  Medications Ordered in ED Medications - No data to display   Initial Impression / Assessment and Plan / ED Course  I have reviewed the triage vital signs and the nursing notes.  Pertinent labs & imaging results that were available during my care of the patient were reviewed by me and considered in my medical decision making (see chart for details).     Final Clinical Impressions(s) / ED Diagnoses   Final diagnoses:  Encounter for care related to feeding tube    ED Discharge Orders    None       Hayden Rasmussen, MD 05/01/17 1108

## 2017-04-30 NOTE — ED Triage Notes (Signed)
Pt pulled out feeding tube.  59F replacement.

## 2017-06-22 ENCOUNTER — Other Ambulatory Visit (HOSPITAL_COMMUNITY): Payer: Self-pay

## 2017-06-22 ENCOUNTER — Ambulatory Visit (HOSPITAL_COMMUNITY): Payer: Self-pay | Admitting: Hematology

## 2017-08-06 ENCOUNTER — Emergency Department (HOSPITAL_COMMUNITY): Payer: Medicare Other

## 2017-08-06 ENCOUNTER — Other Ambulatory Visit: Payer: Self-pay

## 2017-08-06 ENCOUNTER — Emergency Department (HOSPITAL_COMMUNITY)
Admission: EM | Admit: 2017-08-06 | Discharge: 2017-08-07 | Disposition: A | Discharge status: Home / Self Care | Payer: Medicare Other | Attending: Emergency Medicine | Admitting: Emergency Medicine

## 2017-08-06 ENCOUNTER — Encounter (HOSPITAL_COMMUNITY): Payer: Self-pay | Admitting: Emergency Medicine

## 2017-08-06 DIAGNOSIS — N433 Hydrocele, unspecified: Secondary | ICD-10-CM | POA: Diagnosis not present

## 2017-08-06 DIAGNOSIS — K4090 Unilateral inguinal hernia, without obstruction or gangrene, not specified as recurrent: Secondary | ICD-10-CM | POA: Diagnosis not present

## 2017-08-06 DIAGNOSIS — F209 Schizophrenia, unspecified: Secondary | ICD-10-CM

## 2017-08-06 DIAGNOSIS — Z8673 Personal history of transient ischemic attack (TIA), and cerebral infarction without residual deficits: Secondary | ICD-10-CM | POA: Diagnosis not present

## 2017-08-06 DIAGNOSIS — N50819 Testicular pain, unspecified: Secondary | ICD-10-CM

## 2017-08-06 DIAGNOSIS — F250 Schizoaffective disorder, bipolar type: Secondary | ICD-10-CM | POA: Diagnosis not present

## 2017-08-06 DIAGNOSIS — J90 Pleural effusion, not elsewhere classified: Secondary | ICD-10-CM | POA: Diagnosis not present

## 2017-08-06 DIAGNOSIS — E039 Hypothyroidism, unspecified: Secondary | ICD-10-CM | POA: Insufficient documentation

## 2017-08-06 DIAGNOSIS — F17200 Nicotine dependence, unspecified, uncomplicated: Secondary | ICD-10-CM | POA: Diagnosis not present

## 2017-08-06 DIAGNOSIS — Z79899 Other long term (current) drug therapy: Secondary | ICD-10-CM | POA: Insufficient documentation

## 2017-08-06 DIAGNOSIS — Z66 Do not resuscitate: Secondary | ICD-10-CM | POA: Diagnosis not present

## 2017-08-06 DIAGNOSIS — R456 Violent behavior: Secondary | ICD-10-CM | POA: Diagnosis present

## 2017-08-06 HISTORY — DX: Unilateral inguinal hernia, without obstruction or gangrene, not specified as recurrent: K40.90

## 2017-08-06 HISTORY — DX: Hydrocele, unspecified: N43.3

## 2017-08-06 LAB — COMPREHENSIVE METABOLIC PANEL
ALBUMIN: 3.3 g/dL — AB (ref 3.5–5.0)
ALT: 12 U/L (ref 0–44)
ANION GAP: 8 (ref 5–15)
AST: 15 U/L (ref 15–41)
Alkaline Phosphatase: 61 U/L (ref 38–126)
BUN: 17 mg/dL (ref 6–20)
CO2: 30 mmol/L (ref 22–32)
Calcium: 8.8 mg/dL — ABNORMAL LOW (ref 8.9–10.3)
Chloride: 103 mmol/L (ref 98–111)
Creatinine, Ser: 0.86 mg/dL (ref 0.61–1.24)
GFR calc Af Amer: >60 mL/min (ref >60)
GFR calc non Af Amer: >60 mL/min (ref >60)
GLUCOSE: 89 mg/dL (ref 70–99)
POTASSIUM: 4 mmol/L (ref 3.5–5.1)
SODIUM: 141 mmol/L (ref 135–145)
TOTAL PROTEIN: 7.3 g/dL (ref 6.5–8.1)
Total Bilirubin: 0.4 mg/dL (ref 0.3–1.2)

## 2017-08-06 LAB — URINALYSIS, ROUTINE W REFLEX MICROSCOPIC
BILIRUBIN URINE: NEGATIVE
Glucose, UA: NEGATIVE mg/dL
HGB URINE DIPSTICK: NEGATIVE
Ketones, ur: NEGATIVE mg/dL
Leukocytes, UA: NEGATIVE
NITRITE: NEGATIVE
PROTEIN: NEGATIVE mg/dL
SPECIFIC GRAVITY, URINE: 1.012 (ref 1.005–1.030)
pH: 8 (ref 5.0–8.0)

## 2017-08-06 LAB — CBC WITH DIFFERENTIAL/PLATELET
BASOS ABS: 0.1 10*3/uL (ref 0.0–0.1)
Basophils Relative: 1 %
Eosinophils Absolute: 0.5 10*3/uL (ref 0.0–0.7)
Eosinophils Relative: 6 %
HEMATOCRIT: 37.5 % — AB (ref 39.0–52.0)
Hemoglobin: 11.8 g/dL — ABNORMAL LOW (ref 13.0–17.0)
Lymphocytes Relative: 16 %
Lymphs Abs: 1.3 10*3/uL (ref 0.7–4.0)
MCH: 28 pg (ref 26.0–34.0)
MCHC: 31.5 g/dL (ref 30.0–36.0)
MCV: 89.1 fL (ref 78.0–100.0)
MONO ABS: 1 10*3/uL (ref 0.1–1.0)
Monocytes Relative: 13 %
NEUTROS ABS: 5.3 10*3/uL (ref 1.7–7.7)
Neutrophils Relative %: 64 %
Platelets: 112 10*3/uL — ABNORMAL LOW (ref 150–400)
RBC: 4.21 MIL/uL — ABNORMAL LOW (ref 4.22–5.81)
RDW: 16.3 % — AB (ref 11.5–15.5)
WBC: 8.3 10*3/uL (ref 4.0–10.5)

## 2017-08-06 LAB — RAPID URINE DRUG SCREEN, HOSP PERFORMED
Amphetamines: NOT DETECTED
Barbiturates: NOT DETECTED
Benzodiazepines: NOT DETECTED
COCAINE: NOT DETECTED
OPIATES: NOT DETECTED
TETRAHYDROCANNABINOL: NOT DETECTED

## 2017-08-06 LAB — VALPROIC ACID LEVEL: Valproic Acid Lvl: 30 ug/mL — ABNORMAL LOW (ref 50.0–100.0)

## 2017-08-06 MED ORDER — ACETAMINOPHEN 325 MG PO TABS
650.0000 mg | ORAL_TABLET | Freq: Once | ORAL | Status: AC
  Administered 2017-08-06: 650 mg via ORAL
  Filled 2017-08-06: qty 2
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
## 2017-08-06 NOTE — ED Notes (Signed)
[facility] notified of patient needing transportation back to facility.

## 2017-08-06 NOTE — ED Notes (Addendum)
TTS speaking with patient. 

## 2017-08-06 NOTE — Discharge Instructions (Addendum)
Take your usual prescriptions as previously directed.  Call your regular medical doctor tomorrow to schedule a follow up appointment within the next 2 to 3 days. Call the Urologist, the General Surgeon, and the Pulmonologist tomorrow to schedule follow up appointments within the next several weeks for your incidental and chronic findings on your testing today. Return to the Emergency Department immediately sooner if worsening.
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
## 2017-08-08 LAB — URINE CULTURE

## 2017-08-09 IMAGING — RF DG SWALLOWING FUNCTION
1 series · 1 of 1 positions shown · non-contrast
Comparison: None.

CLINICAL DATA: Dysphagia.  PEG tube

EXAM:
MODIFIED BARIUM SWALLOW
TECHNIQUE: Different consistencies of barium were administered orally to the
patient by the Speech Pathologist. Imaging of the pharynx was
performed in the lateral projection.
FLUOROSCOPY TIME:  Radiation Exposure Index (as provided by the
fluoroscopic device):
If the device does not provide the exposure index:
Fluoroscopy Time:  3 minutes
Number of Acquired Images:  0

[Series 1: run · 1 of 1 slices shown]
[im 1/1]
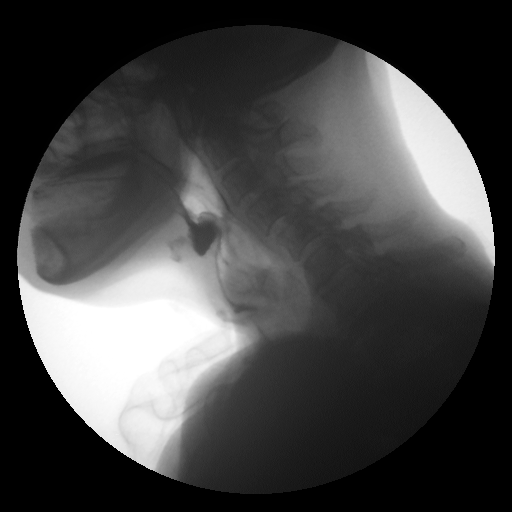

[1 of 1 positions shown; findings below may reference images not displayed]

FINDINGS: Thin liquid- not administered

Nectar thick liquid- severely delayed oral transit with premature
spill, delayed swallow trigger, penetration and aspiration.
Significant retention.

Honey- severely delayed oral transit with premature spill, delayed
swallow trigger and severe retention

Dihsaan?Lotfe severely delayed oral transit with premature spill, delayed
swallow trigger and significant retention

Cracker-not administered

Dihsaan?Bechtold with cracker- delayed oral transit with premature spill,
delayed swallow trigger and mild retention.

Barium tablet -  within normal limits
IMPRESSION: Frank aspiration with nectar thick liquid. Severely delayed oral
transit with premature spill, delayed swallow trigger and retention
with all consistencies.

Please refer to the Speech Pathologists report for complete details
and recommendations.

## 2017-08-14 ENCOUNTER — Encounter: Payer: Self-pay | Admitting: Pulmonary Disease

## 2017-08-14 ENCOUNTER — Ambulatory Visit (INDEPENDENT_AMBULATORY_CARE_PROVIDER_SITE_OTHER): Payer: Medicare Other | Admitting: Pulmonary Disease

## 2017-08-14 DIAGNOSIS — J90 Pleural effusion, not elsewhere classified: Secondary | ICD-10-CM

## 2017-08-14 MED ORDER — FUROSEMIDE 40 MG PO TABS: 20.0000 mg | ORAL_TABLET | Freq: Every day | ORAL | 2 refills | Status: DC

## 2017-08-14 NOTE — Progress Notes (Signed)
ARLIND KLINGERMAN    [id number]    [birth year]  Primary Care Physician:Ariza, Anthony Sar, MD  Referring Physician: Hilbert Corrigan, [place], [place] [address]  Chief complaint: Consult for pleural effusion  HPI: 62 year old with history of schizophrenia, drug-induced Parkinson's disease, active smoker.  Evaluated in the ED on 7/29 for aggressive behavior He had imaging at that time which showed a small right pleural effusion which appears somewhat loculated and has been referred to pulmonary for further evaluation  PMH noted for active smoking, admission in March 2019 for aspiration pneumonia Unable to get a good history and review of systems from him due to difficulty speaking but as well as I can gather he does not have any cough, sputum production, fevers, chills.  Outpatient Encounter Medications as of 08/14/2017  Medication Sig  . acetaminophen (TYLENOL) 325 MG tablet Place 650 mg into feeding tube every 6 (six) hours as needed for moderate pain or fever.  . cholecalciferol (VITAMIN D) 1000 units tablet Take 1,000 Units by mouth daily.  . divalproex (DEPAKOTE SPRINKLE) 125 MG capsule Take 625 mg by mouth 2 (two) times daily.  . ferrous sulfate 325 (65 FE) MG tablet 325 mg daily with breakfast. Per tube  . guaifenesin (HUMIBID E) 400 MG TABS tablet Take 400 mg by mouth 2 (two) times daily. Per tube   . haloperidol decanoate (HALDOL DECANOATE) 50 MG/ML injection Inject 100 mg into the muscle every 28 (twenty-eight) days.  Marland Kitchen levETIRAcetam (KEPPRA) 250 MG tablet Take 250 mg by mouth 2 (two) times daily. Per tube  . levothyroxine (SYNTHROID, LEVOTHROID) 100 MCG tablet Take 100 mcg by mouth daily before breakfast.  . lovastatin (MEVACOR) 20 MG tablet Place 1 tablet (20 mg total) into feeding tube daily.  . Nutritional Supplements (FEEDING SUPPLEMENT, JEVITY 1.5 CAL/FIBER,) LIQD Place 1,000 mLs into feeding tube continuous. Initiate nocturnal feedings  of Jevity 1.5 @ 100 ml/hr via PEG over 14 hour period  . OLANZapine zydis (ZYPREXA) 5 MG disintegrating tablet Take 5 mg by mouth at bedtime.  Marland Kitchen oxybutynin (DITROPAN) 5 MG tablet Take 5 mg by mouth 2 (two) times daily.  . polyethylene glycol (MIRALAX / GLYCOLAX) packet Take 17 g by mouth daily.  . vitamin C (ASCORBIC ACID) 500 MG tablet Take 500 mg by mouth 2 (two) times daily.   . haloperidol (HALDOL) 10 MG tablet Take one (1) tablet by mouth at bedtime   No facility-administered encounter medications on file as of 08/14/2017.     Allergies as of 08/14/2017  . (No Known Allergies)    Past Medical History:  Diagnosis Date  . Aggressive outburst   . Cognitive communication deficit   . Colostomy in place [facility])   . Drug-induced Parkinsonism ([place])   . Dysphagia   . Dysphagia   . Hard of hearing    Wears bilateral hearing aides  . Hernia   . High cholesterol   . HOH (hard of hearing)   . Hydrocele, left   . Left inguinal hernia   . Malnutrition ([place])   . Malnutrition ([place])   . Schizoaffective disorder ([place])   . Schizophrenia ([place])   . Scrotal hernia    left  . Seizures ([place])   . Thyroid disease   . TIA (transient ischemic attack)     Past Surgical History:  Procedure Laterality Date  . ESOPHAGOGASTRODUODENOSCOPY (EGD) WITH PROPOFOL N/A 09/08/2014   Procedure: ESOPHAGOGASTRODUODENOSCOPY (EGD) WITH PROPOFOL;  Surgeon: Georganna Skeans, MD;  Location[facility] ENDOSCOPY;  Service: Endoscopy;  Laterality: N/A;  . LAPAROTOMY N/A 09/01/2014   Procedure: EXPLORATORY LAPAROTOMY, SIGMOID COLECTOMY, SIGMOID COLOSTOMY, PRIMARY REPAIR OF STRANGULATED INGUINAL HERNIA.;  Surgeon: Rolm Bookbinder, MD;  Location: [place];  Service: General;  Laterality: N/A;  . LEG SURGERY    . PEG PLACEMENT N/A 09/08/2014   Procedure: PERCUTANEOUS ENDOSCOPIC GASTROSTOMY (PEG) PLACEMENT;  Surgeon: Georganna Skeans, MD;  Location: [place];  Service: Endoscopy;  Laterality: N/A;  . PEG PLACEMENT N/A 06/30/2015    Procedure: PERCUTANEOUS ENDOSCOPIC GASTROSTOMY (PEG) REPLACEMENT;  Surgeon: Daneil Dolin, MD;  [facility];  Service: Endoscopy;  Laterality: N/A;  1100  . PEG PLACEMENT N/A 12/30/2015   Procedure: PERCUTANEOUS ENDOSCOPIC GASTROSTOMY (PEG) REPLACEMENT;  Surgeon: Daneil Dolin, MD;  [facility];  Service: Endoscopy;  Laterality: N/A;  . PEG PLACEMENT N/A 08/24/2016   Procedure: PERCUTANEOUS ENDOSCOPIC GASTROSTOMY (PEG) REPLACEMENT;  Surgeon: Daneil Dolin, MD;  [facility];  Service: Endoscopy;  Laterality: N/A;  . TESTICLE SURGERY      Family History  Family history unknown: Yes    Social History   Socioeconomic History  . Marital status: Single    Spouse name: Not on file  . Number of children: Not on file  . Years of education: Not on file  . Highest education level: Not on file  Occupational History  . Not on file  Social Needs  . Financial resource strain: Not on file  . Food insecurity:    Worry: Not on file    Inability: Not on file  . Transportation needs:    Medical: Not on file    Non-medical: Not on file  Tobacco Use  . Smoking status: Current Every Day Smoker  . Smokeless tobacco: Never Used  Substance and Sexual Activity  . Alcohol use: No  . Drug use: No  . Sexual activity: Never  Lifestyle  . Physical activity:    Days per week: Not on file    Minutes per session: Not on file  . Stress: Not on file  Relationships  . Social connections:    Talks on phone: Not on file    Gets together: Not on file    Attends religious service: Not on file    Active member of club or organization: Not on file    Attends meetings of clubs or organizations: Not on file    Relationship status: Not on file  . Intimate partner violence:    Fear of current or ex partner: Not on file    Emotionally abused: Not on file    Physically abused: Not on file    Forced sexual activity: Not on file  Other Topics Concern  . Not on file  Social  History Narrative  . Not on file    Review of systems: Review of Systems  Constitutional: Negative for fever and chills.  HENT: Negative.   Eyes: Negative for blurred vision.  Respiratory: as per HPI  Cardiovascular: Negative for chest pain and palpitations.  Gastrointestinal: Negative for vomiting, diarrhea, blood per rectum. Genitourinary: Negative for dysuria, urgency, frequency and hematuria.  Musculoskeletal: Negative for myalgias, back pain and joint pain.  Skin: Negative for itching and rash.  Neurological: Negative for dizziness, tremors, focal weakness, seizures and loss of consciousness.  Endo/Heme/Allergies: Negative for environmental allergies.  Psychiatric/Behavioral: Negative for depression, suicidal ideas and hallucinations.  All other systems reviewed and are negative.  Physical  Exam: Blood pressure 112/62, pulse 85, height 5\' 7"  (1.702 m), weight 171 lb (77.6 kg), SpO2 98 %. Gen:      No acute distress HEENT:  EOMI, sclera anicteric Neck:     No masses; no thyromegaly Lungs:    Clear to auscultation bilaterally; normal respiratory effort CV:         Regular rate and rhythm; no murmurs Abd:      Ostomy, scrotal swelling Ext:    No edema; adequate peripheral perfusion Skin:      Warm and dry; no rash Neuro: alert and oriented x 3 Psych: normal mood and affect  Data Reviewed: CT chest 03/10/2017- bilateral lower lobe infiltrate left greater than right, peribronchial thickening.  CT abdomen pelvis 08/06/2017- no traumatic injury to the abdomen, pelvis, left inguinal hernia, scrotal hernia.  Small right pleural effusion appears partially loculated. Chest x-ray 08/06/2017-cardiomegaly, mild vascular congestion.  Small right pleural effusion, basilar infiltrate/atelectasis I have reviewed the images personally.  Labs Metabolic panel 08/19/8865 within normal limits CBC 08/06/2017 WBC 8.3, neutrophils 5.3%, eos 6%  Assessment:  Lt pleural effusion I have reviewed the  images which shows a new small left effusion which may be partially loculated. The effusion appears too small to do a thoracentesis. He does not have any signs and symptoms of active infection.  This may be residual from aspiration pneumonia from 2019.  Also has cardiomegaly and vascular congestion suggestive of heart failure  We will give him Lasix 20 mg a day and reassess with a follow-up CT scan  Plan/Recommendations: - Lasix 20 mg a day - CT chest without contrast in 1 month.  Marshell Garfinkel MD [facility] 08/14/2017, 11:50 AM  CC: Hilbert Corrigan*

## 2017-08-14 NOTE — Patient Instructions (Addendum)
Starting Lasix 20 mg a day Follow-up with CT chest without contrast in 1 month time to reevaluate your pleural effusion Follow-up in clinic after CT scan.

## 2017-08-27 ENCOUNTER — Ambulatory Visit (HOSPITAL_COMMUNITY)
Admission: RE | Admit: 2017-08-27 | Discharge: 2017-08-27 | Disposition: A | Discharge status: Home / Self Care | Payer: Medicare Other | Source: Ambulatory Visit | Attending: Pulmonary Disease | Admitting: Pulmonary Disease

## 2017-08-27 DIAGNOSIS — J90 Pleural effusion, not elsewhere classified: Secondary | ICD-10-CM | POA: Diagnosis not present

## 2017-08-27 DIAGNOSIS — I700 Atherosclerosis of aorta: Secondary | ICD-10-CM | POA: Insufficient documentation

## 2017-08-27 DIAGNOSIS — J439 Emphysema, unspecified: Secondary | ICD-10-CM | POA: Diagnosis not present

## 2017-08-30 ENCOUNTER — Encounter: Payer: Self-pay | Admitting: General Surgery

## 2017-08-30 ENCOUNTER — Ambulatory Visit (INDEPENDENT_AMBULATORY_CARE_PROVIDER_SITE_OTHER): Payer: Medicare Other | Admitting: General Surgery

## 2017-08-30 VITALS — BP 113/59 | HR 81 | Temp 98.0°F

## 2017-08-30 DIAGNOSIS — K4090 Unilateral inguinal hernia, without obstruction or gangrene, not specified as recurrent: Secondary | ICD-10-CM | POA: Diagnosis not present

## 2017-08-30 NOTE — Progress Notes (Signed)
Steven David; [id number]; 01-14-1955   HPI Patient is a 62 year old white male who resides in a nursing home who was referred to my care by Dr. Mal Amabile in the emergency room for a left anal hernia.  It is difficult to get a full history from the patient, but he has had this hernia for some time now.  A nurse assistant was present during the visit and states that it has been present for many months.  It is gone to the point where it is difficult for him to urinate as the hernia encompasses the whole left side of his scrotum and causes penile retraction.  He says it occasionally stays stuck out and hurts.  He denies any nausea or vomiting.  He has 0 out of 10 pain.  He does have dysarthria which makes history taking limited. Past Medical History:  Diagnosis Date  . Aggressive outburst   . Cognitive communication deficit   . Colostomy in place [facility])   . Drug-induced Parkinsonism ([place])   . Dysphagia   . Dysphagia   . Hard of hearing    Wears bilateral hearing aides  . Hernia   . High cholesterol   . HOH (hard of hearing)   . Hydrocele, left   . Left inguinal hernia   . Malnutrition ([place])   . Malnutrition ([place])   . Schizoaffective disorder ([place])   . Schizophrenia ([place])   . Scrotal hernia    left  . Seizures ([place])   . Thyroid disease   . TIA (transient ischemic attack)     Past Surgical History:  Procedure Laterality Date  . ESOPHAGOGASTRODUODENOSCOPY (EGD) WITH PROPOFOL N/A 09/08/2014   Procedure: ESOPHAGOGASTRODUODENOSCOPY (EGD) WITH PROPOFOL;  Surgeon: Georganna Skeans, MD;  Location: [place];  Service: Endoscopy;  Laterality: N/A;  . LAPAROTOMY N/A 09/01/2014   Procedure: EXPLORATORY LAPAROTOMY, SIGMOID COLECTOMY, SIGMOID COLOSTOMY, PRIMARY REPAIR OF STRANGULATED INGUINAL HERNIA.;  Surgeon: Rolm Bookbinder, MD;  Location: [place];  Service: General;  Laterality: N/A;  . LEG SURGERY    . PEG PLACEMENT N/A 09/08/2014   Procedure: PERCUTANEOUS ENDOSCOPIC GASTROSTOMY (PEG) PLACEMENT;   Surgeon: Georganna Skeans, MD;  Location: [place];  Service: Endoscopy;  Laterality: N/A;  . PEG PLACEMENT N/A 06/30/2015   Procedure: PERCUTANEOUS ENDOSCOPIC GASTROSTOMY (PEG) REPLACEMENT;  Surgeon: Daneil Dolin, MD;  [facility];  Service: Endoscopy;  Laterality: N/A;  1100  . PEG PLACEMENT N/A 12/30/2015   Procedure: PERCUTANEOUS ENDOSCOPIC GASTROSTOMY (PEG) REPLACEMENT;  Surgeon: Daneil Dolin, MD;  [facility];  Service: Endoscopy;  Laterality: N/A;  . PEG PLACEMENT N/A 08/24/2016   Procedure: PERCUTANEOUS ENDOSCOPIC GASTROSTOMY (PEG) REPLACEMENT;  Surgeon: Daneil Dolin, MD;  [facility];  Service: Endoscopy;  Laterality: N/A;  . TESTICLE SURGERY      Family History  Family history unknown: Yes    Current Outpatient Medications on File Prior to Visit  Medication Sig Dispense Refill  . acetaminophen (TYLENOL) 325 MG tablet Place 650 mg into feeding tube every 6 (six) hours as needed for moderate pain or fever.    . cholecalciferol (VITAMIN D) 1000 units tablet Take 1,000 Units by mouth daily.    . divalproex (DEPAKOTE SPRINKLE) 125 MG capsule Take 625 mg by mouth 2 (two) times daily.    . ferrous sulfate 325 (65 FE) MG tablet 325 mg daily with breakfast. Per tube    . furosemide (LASIX) 40 MG tablet Take 0.5 tablets (20 mg total) by mouth daily.  30 tablet 2  . guaifenesin (HUMIBID E) 400 MG TABS tablet Take 400 mg by mouth 2 (two) times daily. Per tube     . haloperidol (HALDOL) 10 MG tablet Take one (1) tablet by mouth at bedtime    . haloperidol decanoate (HALDOL DECANOATE) 50 MG/ML injection Inject 100 mg into the muscle every 28 (twenty-eight) days.    Marland Kitchen levETIRAcetam (KEPPRA) 250 MG tablet Take 250 mg by mouth 2 (two) times daily. Per tube    . levothyroxine (SYNTHROID, LEVOTHROID) 100 MCG tablet Take 100 mcg by mouth daily before breakfast.    . lovastatin (MEVACOR) 20 MG tablet Place 1 tablet (20 mg total) into feeding tube daily.    .  Nutritional Supplements (FEEDING SUPPLEMENT, JEVITY 1.5 CAL/FIBER,) LIQD Place 1,000 mLs into feeding tube continuous. Initiate nocturnal feedings of Jevity 1.5 @ 100 ml/hr via PEG over 14 hour period    . OLANZapine zydis (ZYPREXA) 5 MG disintegrating tablet Take 5 mg by mouth at bedtime.    Marland Kitchen oxybutynin (DITROPAN) 5 MG tablet Take 5 mg by mouth 2 (two) times daily.    . polyethylene glycol (MIRALAX / GLYCOLAX) packet Take 17 g by mouth daily.    . vitamin C (ASCORBIC ACID) 500 MG tablet Take 500 mg by mouth 2 (two) times daily.      No current facility-administered medications on file prior to visit.     No Known Allergies  Social History   Substance and Sexual Activity  Alcohol Use No    Social History   Tobacco Use  Smoking Status Current Every Day Smoker  Smokeless Tobacco Never Used    Review of Systems  Unable to perform ROS: Psychiatric disorder    Objective   Vitals:   08/30/17 1045  BP: (!) 113/59  Pulse: 81  Temp: 98 F (36.7 C)    Physical Exam  Constitutional: He appears well-developed and well-nourished. No distress.  HENT:  Head: Normocephalic and atraumatic.  Cardiovascular: Normal rate, regular rhythm and normal heart sounds. Exam reveals no gallop and no friction rub.  No murmur heard. Pulmonary/Chest: Effort normal and breath sounds normal. No stridor. No respiratory distress. He has no wheezes. He has no rales.  Abdominal: Soft. Bowel sounds are normal. He exhibits no distension. There is no tenderness. There is no guarding. A hernia is present.  A PEG tube is in place.  A left lower quadrant colostomy is in place.  The patient has a large left inguinal hernia with a significant amount of intestinal contents within the scrotum.  Reduction was not attempted.  He does have no tenderness over this area.  Genitourinary:  Genitourinary Comments: The penis is retracted due to a significant scrotal hernia.  Vitals reviewed. ER notes  reviewed  Assessment  Left inguinal hernia Plan   Patient is scheduled for left inguinal herniorrhaphy with mesh on 09/19/2017.  The risks and benefits of the procedure including bleeding, infection, mesh use, bowel injury, and the possibility of recurrence of the hernia were fully explained to the patient, who gave informed consent.  He is competent to sign his own consent.

## 2017-08-30 NOTE — Patient Instructions (Signed)
Inguinal Hernia, Adult An inguinal hernia is when fat or the intestines push through the area where the leg meets the lower belly (groin) and make a rounded lump (bulge). This condition happens over time. There are three types of inguinal hernias. These types include:  Hernias that can be pushed back into the belly (are reducible).  Hernias that cannot be pushed back into the belly (are incarcerated).  Hernias that cannot be pushed back into the belly and lose their blood supply (get strangulated). This type needs emergency surgery.  Follow these instructions at home: Lifestyle  Drink enough fluid to keep your urine (pee) clear or pale yellow.  Eat plenty of fruits, vegetables, and whole grains. These have a lot of fiber. Talk with your doctor if you have questions.  Avoid lifting heavy objects.  Avoid standing for long periods of time.  Do not use tobacco products. These include cigarettes, chewing tobacco, or e-cigarettes. If you need help quitting, ask your doctor.  Try to stay at a healthy weight. General instructions  Do not try to force the hernia back in.  Watch your hernia for any changes in color or size. Let your doctor know if there are any changes.  Take over-the-counter and prescription medicines only as told by your doctor.  Keep all follow-up visits as told by your doctor. This is important. Contact a doctor if:  You have a fever.  You have new symptoms.  Your symptoms get worse. Get help right away if:  The area where the legs meets the lower belly has: ? Pain that gets worse suddenly. ? A bulge that gets bigger suddenly and does not go down. ? A bulge that turns red or purple. ? A bulge that is painful to the touch.  You are a man and your scrotum: ? Suddenly feels painful. ? Suddenly changes in size.  You feel sick to your stomach (nauseous) and this feeling does not go away.  You throw up (vomit) and this keeps happening.  You feel your heart  beating a lot more quickly than normal.  You cannot poop (have a bowel movement) or pass gas. This information is not intended to replace advice given to you by your health care provider. Make sure you discuss any questions you have with your health care provider. Document Released: 01/26/2006 Document Revised: 06/03/2015 Document Reviewed: 11/05/2013 Elsevier Interactive Patient Education  2018 Elsevier Inc.  

## 2017-08-30 NOTE — H&P (Signed)
Steven David; [id number]; 1955-05-16   HPI Patient is a 62 year old white male who resides in a nursing home who was referred to my care by Dr. Mal Amabile in the emergency room for a left anal hernia.  It is difficult to get a full history from the patient, but he has had this hernia for some time now.  A nurse assistant was present during the visit and states that it has been present for many months.  It is gone to the point where it is difficult for him to urinate as the hernia encompasses the whole left side of his scrotum and causes penile retraction.  He says it occasionally stays stuck out and hurts.  He denies any nausea or vomiting.  He has 0 out of 10 pain.  He does have dysarthria which makes history taking limited. Past Medical History:  Diagnosis Date  . Aggressive outburst   . Cognitive communication deficit   . Colostomy in place [facility])   . Drug-induced Parkinsonism ([place])   . Dysphagia   . Dysphagia   . Hard of hearing    Wears bilateral hearing aides  . Hernia   . High cholesterol   . HOH (hard of hearing)   . Hydrocele, left   . Left inguinal hernia   . Malnutrition ([place])   . Malnutrition ([place])   . Schizoaffective disorder ([place])   . Schizophrenia ([place])   . Scrotal hernia    left  . Seizures ([place])   . Thyroid disease   . TIA (transient ischemic attack)     Past Surgical History:  Procedure Laterality Date  . ESOPHAGOGASTRODUODENOSCOPY (EGD) WITH PROPOFOL N/A 09/08/2014   Procedure: ESOPHAGOGASTRODUODENOSCOPY (EGD) WITH PROPOFOL;  Surgeon: Georganna Skeans, MD;  Location: [place];  Service: Endoscopy;  Laterality: N/A;  . LAPAROTOMY N/A 09/01/2014   Procedure: EXPLORATORY LAPAROTOMY, SIGMOID COLECTOMY, SIGMOID COLOSTOMY, PRIMARY REPAIR OF STRANGULATED INGUINAL HERNIA.;  Surgeon: Rolm Bookbinder, MD;  Location: [place];  Service: General;  Laterality: N/A;  . LEG SURGERY    . PEG PLACEMENT N/A 09/08/2014   Procedure: PERCUTANEOUS ENDOSCOPIC GASTROSTOMY (PEG) PLACEMENT;   Surgeon: Georganna Skeans, MD;  Location: [place];  Service: Endoscopy;  Laterality: N/A;  . PEG PLACEMENT N/A 06/30/2015   Procedure: PERCUTANEOUS ENDOSCOPIC GASTROSTOMY (PEG) REPLACEMENT;  Surgeon: Daneil Dolin, MD;  [facility];  Service: Endoscopy;  Laterality: N/A;  1100  . PEG PLACEMENT N/A 12/30/2015   Procedure: PERCUTANEOUS ENDOSCOPIC GASTROSTOMY (PEG) REPLACEMENT;  Surgeon: Daneil Dolin, MD;  [facility];  Service: Endoscopy;  Laterality: N/A;  . PEG PLACEMENT N/A 08/24/2016   Procedure: PERCUTANEOUS ENDOSCOPIC GASTROSTOMY (PEG) REPLACEMENT;  Surgeon: Daneil Dolin, MD;  [facility];  Service: Endoscopy;  Laterality: N/A;  . TESTICLE SURGERY      Family History  Family history unknown: Yes    Current Outpatient Medications on File Prior to Visit  Medication Sig Dispense Refill  . acetaminophen (TYLENOL) 325 MG tablet Place 650 mg into feeding tube every 6 (six) hours as needed for moderate pain or fever.    . cholecalciferol (VITAMIN D) 1000 units tablet Take 1,000 Units by mouth daily.    . divalproex (DEPAKOTE SPRINKLE) 125 MG capsule Take 625 mg by mouth 2 (two) times daily.    . ferrous sulfate 325 (65 FE) MG tablet 325 mg daily with breakfast. Per tube    . furosemide (LASIX) 40 MG tablet Take 0.5 tablets (20 mg total) by mouth daily.  30 tablet 2  . guaifenesin (HUMIBID E) 400 MG TABS tablet Take 400 mg by mouth 2 (two) times daily. Per tube     . haloperidol (HALDOL) 10 MG tablet Take one (1) tablet by mouth at bedtime    . haloperidol decanoate (HALDOL DECANOATE) 50 MG/ML injection Inject 100 mg into the muscle every 28 (twenty-eight) days.    Marland Kitchen levETIRAcetam (KEPPRA) 250 MG tablet Take 250 mg by mouth 2 (two) times daily. Per tube    . levothyroxine (SYNTHROID, LEVOTHROID) 100 MCG tablet Take 100 mcg by mouth daily before breakfast.    . lovastatin (MEVACOR) 20 MG tablet Place 1 tablet (20 mg total) into feeding tube daily.    .  Nutritional Supplements (FEEDING SUPPLEMENT, JEVITY 1.5 CAL/FIBER,) LIQD Place 1,000 mLs into feeding tube continuous. Initiate nocturnal feedings of Jevity 1.5 @ 100 ml/hr via PEG over 14 hour period    . OLANZapine zydis (ZYPREXA) 5 MG disintegrating tablet Take 5 mg by mouth at bedtime.    Marland Kitchen oxybutynin (DITROPAN) 5 MG tablet Take 5 mg by mouth 2 (two) times daily.    . polyethylene glycol (MIRALAX / GLYCOLAX) packet Take 17 g by mouth daily.    . vitamin C (ASCORBIC ACID) 500 MG tablet Take 500 mg by mouth 2 (two) times daily.      No current facility-administered medications on file prior to visit.     No Known Allergies  Social History   Substance and Sexual Activity  Alcohol Use No    Social History   Tobacco Use  Smoking Status Current Every Day Smoker  Smokeless Tobacco Never Used    Review of Systems  Unable to perform ROS: Psychiatric disorder    Objective   Vitals:   08/30/17 1045  BP: (!) 113/59  Pulse: 81  Temp: 98 F (36.7 C)    Physical Exam  Constitutional: He appears well-developed and well-nourished. No distress.  HENT:  Head: Normocephalic and atraumatic.  Cardiovascular: Normal rate, regular rhythm and normal heart sounds. Exam reveals no gallop and no friction rub.  No murmur heard. Pulmonary/Chest: Effort normal and breath sounds normal. No stridor. No respiratory distress. He has no wheezes. He has no rales.  Abdominal: Soft. Bowel sounds are normal. He exhibits no distension. There is no tenderness. There is no guarding. A hernia is present.  A PEG tube is in place.  A left lower quadrant colostomy is in place.  The patient has a large left inguinal hernia with a significant amount of intestinal contents within the scrotum.  Reduction was not attempted.  He does have no tenderness over this area.  Genitourinary:  Genitourinary Comments: The penis is retracted due to a significant scrotal hernia.  Vitals reviewed. ER notes  reviewed  Assessment  Left inguinal hernia Plan   Patient is scheduled for left inguinal herniorrhaphy with mesh on 09/19/2017.  The risks and benefits of the procedure including bleeding, infection, mesh use, bowel injury, and the possibility of recurrence of the hernia were fully explained to the patient, who gave informed consent.  He is competent to sign his own consent.

## 2017-09-04 ENCOUNTER — Ambulatory Visit (INDEPENDENT_AMBULATORY_CARE_PROVIDER_SITE_OTHER): Payer: Medicare Other | Admitting: Urology

## 2017-09-04 DIAGNOSIS — R430 Anosmia: Secondary | ICD-10-CM

## 2017-09-11 NOTE — Patient Instructions (Signed)
Your procedure is scheduled on: 09/19/2017  Report to [facility] at  6:15   AM.  Call this number if you have problems the morning of surgery: [phone]   Remember:   Do not drink or eat food:After Midnight.  :  Take these medicines the morning of surgery with A SIP OF WATER: Depakote, Keppra and Synthroid   Do not wear jewelry, make-up or nail polish.  Do not wear lotions, powders, or perfumes. You may wear deodorant.  Do not shave 48 hours prior to surgery. Men may shave face and neck.  Do not bring valuables to the hospital.  Contacts, dentures or bridgework may not be worn into surgery.  Leave suitcase in the car. After surgery it may be brought to your room.  For patients admitted to the hospital, checkout time is 11:00 AM the day of discharge.   Patients discharged the day of surgery will not be allowed to drive home.    Special Instructions: Shower using CHG night before surgery and shower the day of surgery use CHG.  Use special wash - you have one bottle of CHG for all showers.  You should use approximately 1/2 of the bottle for each shower.  Inguinal Hernia, Adult An inguinal hernia is when fat or the intestines push through the area where the leg meets the lower belly (groin) and make a rounded lump (bulge). This condition happens over time. There are three types of inguinal hernias. These types include:  Hernias that can be pushed back into the belly (are reducible).  Hernias that cannot be pushed back into the belly (are incarcerated).  Hernias that cannot be pushed back into the belly and lose their blood supply (get strangulated). This type needs emergency surgery.  Follow these instructions at home: Lifestyle  Drink enough fluid to keep your urine (pee) clear or pale yellow.  Eat plenty of fruits, vegetables, and whole grains. These have a lot of fiber. Talk with your doctor if you have questions.  Avoid lifting heavy objects.  Avoid standing for long  periods of time.  Do not use tobacco products. These include cigarettes, chewing tobacco, or e-cigarettes. If you need help quitting, ask your doctor.  Try to stay at a healthy weight. General instructions  Do not try to force the hernia back in.  Watch your hernia for any changes in color or size. Let your doctor know if there are any changes.  Take over-the-counter and prescription medicines only as told by your doctor.  Keep all follow-up visits as told by your doctor. This is important. Contact a doctor if:  You have a fever.  You have new symptoms.  Your symptoms get worse. Get help right away if:  The area where the legs meets the lower belly has: ? Pain that gets worse suddenly. ? A bulge that gets bigger suddenly and does not go down. ? A bulge that turns red or purple. ? A bulge that is painful to the touch.  You are a man and your scrotum: ? Suddenly feels painful. ? Suddenly changes in size.  You feel sick to your stomach (nauseous) and this feeling does not go away.  You throw up (vomit) and this keeps happening.  You feel your heart beating a lot more quickly than normal.  You cannot poop (have a bowel movement) or pass gas. This information is not intended to replace advice given to you by your health care provider. Make sure you discuss any questions  you have with your health care provider. Document Released: 01/26/2006 Document Revised: 06/03/2015 Document Reviewed: 11/05/2013 Elsevier Interactive Patient Education  2018 [place] Repair, Adult, Care After These instructions give you information about caring for yourself after your procedure. Your doctor may also give you more specific instructions. If you have problems or questions, contact your doctor. Follow these instructions at home: Surgical cut (incision) care   Follow instructions from your doctor about how to take care of your surgical cut area. Make sure you: ? Wash your  hands with soap and water before you change your bandage (dressing). If you cannot use soap and water, use hand sanitizer. ? Change your bandage as told by your doctor. ? Leave stitches (sutures), skin glue, or skin tape (adhesive) strips in place. They may need to stay in place for 2 weeks or longer. If tape strips get loose and curl up, you may trim the loose edges. Do not remove tape strips completely unless your doctor says it is okay.  Check your surgical cut every day for signs of infection. Check for: ? More redness, swelling, or pain. ? More fluid or blood. ? Warmth. ? Pus or a bad smell. Activity  Do not drive or use heavy machinery while taking prescription pain medicine. Do not drive until your doctor says it is okay.  Until your doctor says it is okay: ? Do not lift anything that is heavier than 10 lb (4.5 kg). ? Do not play contact sports.  Return to your normal activities as told by your doctor. Ask your doctor what activities are safe. General instructions  To prevent or treat having a hard time pooping (constipation) while you are taking prescription pain medicine, your doctor may recommend that you: ? Drink enough fluid to keep your pee (urine) clear or pale yellow. ? Take over-the-counter or prescription medicines. ? Eat foods that are high in fiber, such as fresh fruits and vegetables, whole grains, and beans. ? Limit foods that are high in fat and processed sugars, such as fried and sweet foods.  Take over-the-counter and prescription medicines only as told by your doctor.  Do not take baths, swim, or use a hot tub until your doctor says it is okay.  Keep all follow-up visits as told by your doctor. This is important. Contact a doctor if:  You develop a rash.  You have more redness, swelling, or pain around your surgical cut.  You have more fluid or blood coming from your surgical cut.  Your surgical cut feels warm to the touch.  You have pus or a bad  smell coming from your surgical cut.  You have a fever or chills.  You have blood in your poop (stool).  You have not pooped in 2-3 days.  Medicine does not help your pain. Get help right away if:  You have chest pain or you are short of breath.  You feel light-headed.  You feel weak and dizzy (feel faint).  You have very bad pain.  You throw up (vomit) and your pain is worse. This information is not intended to replace advice given to you by your health care provider. Make sure you discuss any questions you have with your health care provider. Document Released: 01/16/2014 Document Revised: 07/16/2015 Document Reviewed: 06/09/2015 Elsevier Interactive Patient Education  2018 [place] Anesthesia, Adult, Care After These instructions provide you with information about caring for yourself after your procedure. Your health care provider may also  give you more specific instructions. Your treatment has been planned according to current medical practices, but problems sometimes occur. Call your health care provider if you have any problems or questions after your procedure. What can I expect after the procedure? After the procedure, it is common to have:  Vomiting.  A sore throat.  Mental slowness.  It is common to feel:  Nauseous.  Cold or shivery.  Sleepy.  Tired.  Sore or achy, even in parts of your body where you did not have surgery.  Follow these instructions at home: For at least 24 hours after the procedure:  Do not: ? Participate in activities where you could fall or become injured. ? Drive. ? Use heavy machinery. ? Drink alcohol. ? Take sleeping pills or medicines that cause drowsiness. ? Make important decisions or sign legal documents. ? Take care of children on your own.  Rest. Eating and drinking  If you vomit, drink water, juice, or soup when you can drink without vomiting.  Drink enough fluid to keep your urine clear or pale  yellow.  Make sure you have little or no nausea before eating solid foods.  Follow the diet recommended by your health care provider. General instructions  Have a responsible adult stay with you until you are awake and alert.  Return to your normal activities as told by your health care provider. Ask your health care provider what activities are safe for you.  Take over-the-counter and prescription medicines only as told by your health care provider.  If you smoke, do not smoke without supervision.  Keep all follow-up visits as told by your health care provider. This is important. Contact a health care provider if:  You continue to have nausea or vomiting at home, and medicines are not helpful.  You cannot drink fluids or start eating again.  You cannot urinate after 8-12 hours.  You develop a skin rash.  You have fever.  You have increasing redness at the site of your procedure. Get help right away if:  You have difficulty breathing.  You have chest pain.  You have unexpected bleeding.  You feel that you are having a life-threatening or urgent problem. This information is not intended to replace advice given to you by your health care provider. Make sure you discuss any questions you have with your health care provider. Document Released: 04/03/2000 Document Revised: 05/31/2015 Document Reviewed: 12/10/2014 Elsevier Interactive Patient Education  [employer].

## 2017-09-14 ENCOUNTER — Encounter (HOSPITAL_COMMUNITY)
Admission: RE | Admit: 2017-09-14 | Discharge: 2017-09-14 | Disposition: A | Discharge status: Home / Self Care | Payer: Medicare Other | Source: Ambulatory Visit | Attending: General Surgery | Admitting: General Surgery

## 2017-09-14 DIAGNOSIS — Z01812 Encounter for preprocedural laboratory examination: Secondary | ICD-10-CM | POA: Insufficient documentation

## 2017-09-14 LAB — CBC WITH DIFFERENTIAL/PLATELET
BASOS PCT: 0 %
Basophils Absolute: 0 10*3/uL (ref 0.0–0.1)
EOS ABS: 0.1 10*3/uL (ref 0.0–0.7)
EOS PCT: 1 %
HCT: 40.1 % (ref 39.0–52.0)
Hemoglobin: 12.7 g/dL — ABNORMAL LOW (ref 13.0–17.0)
LYMPHS ABS: 1.2 10*3/uL (ref 0.7–4.0)
Lymphocytes Relative: 9 %
MCH: 28.5 pg (ref 26.0–34.0)
MCHC: 31.7 g/dL (ref 30.0–36.0)
MCV: 90.1 fL (ref 78.0–100.0)
Monocytes Absolute: 1.3 10*3/uL — ABNORMAL HIGH (ref 0.1–1.0)
Monocytes Relative: 10 %
Neutro Abs: 11 10*3/uL — ABNORMAL HIGH (ref 1.7–7.7)
Neutrophils Relative %: 80 %
PLATELETS: 120 10*3/uL — AB (ref 150–400)
RBC: 4.45 MIL/uL (ref 4.22–5.81)
RDW: 16.5 % — ABNORMAL HIGH (ref 11.5–15.5)
WBC: 13.6 10*3/uL — AB (ref 4.0–10.5)

## 2017-09-14 LAB — BASIC METABOLIC PANEL
Anion gap: 8 (ref 5–15)
BUN: 19 mg/dL (ref 6–20)
CALCIUM: 8.8 mg/dL — AB (ref 8.9–10.3)
CHLORIDE: 101 mmol/L (ref 98–111)
CO2: 30 mmol/L (ref 22–32)
CREATININE: 0.83 mg/dL (ref 0.61–1.24)
GFR calc Af Amer: >60 mL/min (ref >60)
GFR calc non Af Amer: >60 mL/min (ref >60)
Glucose, Bld: 127 mg/dL — ABNORMAL HIGH (ref 70–99)
Potassium: 4.1 mmol/L (ref 3.5–5.1)
SODIUM: 139 mmol/L (ref 135–145)

## 2017-09-18 ENCOUNTER — Encounter: Payer: Self-pay | Admitting: Pulmonary Disease

## 2017-09-18 ENCOUNTER — Other Ambulatory Visit (INDEPENDENT_AMBULATORY_CARE_PROVIDER_SITE_OTHER): Payer: Medicare Other

## 2017-09-18 ENCOUNTER — Ambulatory Visit (INDEPENDENT_AMBULATORY_CARE_PROVIDER_SITE_OTHER): Payer: Medicare Other | Admitting: Pulmonary Disease

## 2017-09-18 VITALS — BP 124/70 | HR 76 | Ht 67.0 in | Wt 170.0 lb

## 2017-09-18 DIAGNOSIS — J90 Pleural effusion, not elsewhere classified: Secondary | ICD-10-CM | POA: Diagnosis not present

## 2017-09-18 LAB — HEPATIC FUNCTION PANEL
ALBUMIN: 3.7 g/dL (ref 3.5–5.2)
ALT: 11 U/L (ref 0–53)
AST: 14 U/L (ref 0–37)
Alkaline Phosphatase: 69 U/L (ref 39–117)
Bilirubin, Direct: 0 mg/dL (ref 0.0–0.3)
TOTAL PROTEIN: 8.4 g/dL — AB (ref 6.0–8.3)
Total Bilirubin: 0.3 mg/dL (ref 0.2–1.2)

## 2017-09-18 NOTE — Patient Instructions (Addendum)
I have reviewed his CT scan which shows persistent effusion We will schedule you for ultrasound guided thoracentesis for further evaluation Follow-up in 1 to 2 months.

## 2017-09-18 NOTE — Progress Notes (Signed)
FARZAD TIBBETTS    [id number]    [birth year]  Primary Care Physician:Blass, Fara Olden, MD  Referring Physician: Hilbert Corrigan, [address] [place], [place] [address]  Chief complaint: Follow-up for pleural effusion  HPI: 62 year old with history of schizophrenia, drug-induced Parkinson's disease, active smoker.  Evaluated in the ED on 7/29 for aggressive behavior He had imaging at that time which showed a small right pleural effusion which appears somewhat loculated and has been referred to pulmonary for further evaluation  PMH noted for active smoking, admission in March 2019 for aspiration pneumonia Unable to get a good history and review of systems from him due to difficulty speaking but as well as I can gather he does not have any cough, sputum production, fevers, chills.  Interim history: Here for follow-up of his effusion.  Does not have any new complaints today He is scheduled for hernia repair tomorrow by Dr. Arnoldo Morale.  Physical Exam: Blood pressure 124/70, pulse 76, height 5\' 7"  (1.702 m), weight 170 lb (77.1 kg), SpO2 94 %. Gen:      No acute distress HEENT:  EOMI, sclera anicteric Neck:     No masses; no thyromegaly Lungs:    Clear to auscultation bilaterally; normal respiratory effort CV:         Regular rate and rhythm; no murmurs Abd:      + bowel sounds; soft, non-tender; no palpable masses, no distension Ext:    No edema; adequate peripheral perfusion Skin:      Warm and dry; no rash Neuro: alert and oriented x 3 Psych: normal mood and affect  Data Reviewed: CT chest 03/10/2017- bilateral lower lobe infiltrate left greater than right, peribronchial thickening.  CT abdomen pelvis 08/06/2017- no traumatic injury to the abdomen, pelvis, left inguinal hernia, scrotal hernia.  Small right pleural effusion appears partially loculated.  CT 08/27/2017- partially loculated right effusion similar to prior.  Compressive atelectasis, scarring at the  bases. I have reviewed the images personally.  Labs Metabolic panel [id number]/41/9622 within normal limits CBC 08/06/2017 WBC 8.3, neutrophils 5.3%, eos 6%  Assessment:  Lt pleural effusion I have reviewed the images which shows a new small left effusion which may be partially loculated. He does not have any signs and symptoms of active infection.  This may be residual from aspiration pneumonia from 2019.  Also has cardiomegaly and vascular congestion suggestive of heart failure  Repeat CT shows persistent loculated effusion We will schedule him for ultrasound-guided thoracentesis for further evaluation If he is admitted for his hernia repair then we can consider doing it while he is inpatient.  Plan/Recommendations: - Ultrasound-guided thoracentesis by IR   Outpatient Encounter Medications as of 09/18/2017  Medication Sig  . acetaminophen (TYLENOL) 325 MG tablet Place 650 mg into feeding tube every 6 (six) hours as needed for moderate pain or fever.  . cholecalciferol (VITAMIN D) 1000 units tablet Take 1,000 Units by mouth daily.  . divalproex (DEPAKOTE SPRINKLE) 125 MG capsule Take 625 mg by mouth 2 (two) times daily.  . ferrous sulfate 325 (65 FE) MG tablet Take 325 mg by mouth daily with breakfast. Per tube  . furosemide (LASIX) 20 MG tablet Take 20 mg by mouth.  . guaifenesin (HUMIBID E) 400 MG TABS tablet Take 400 mg by mouth 2 (two) times daily. Per tube   . haloperidol (HALDOL) 10 MG tablet Take 10 mg by mouth at bedtime.   . haloperidol decanoate (HALDOL DECANOATE)  50 MG/ML injection Inject 100 mg into the muscle every 28 (twenty-eight) days.  Marland Kitchen levETIRAcetam (KEPPRA) 250 MG tablet Take 250 mg by mouth 2 (two) times daily.   Marland Kitchen levothyroxine (SYNTHROID, LEVOTHROID) 100 MCG tablet Take 100 mcg by mouth daily before breakfast.  . lovastatin (MEVACOR) 20 MG tablet Place 1 tablet (20 mg total) into feeding tube daily. (Patient taking differently: Take 20 mg by mouth daily. )  . Nutritional  Supplements (ISOSOURCE 1.5 CAL PO) Take 240 mLs by mouth 4 (four) times daily. Flush with 60 ml of water before and after bolus feeding.  Marland Kitchen OLANZapine (ZYPREXA) 5 MG tablet Take 5 mg by mouth at bedtime.  Marland Kitchen oxybutynin (DITROPAN) 5 MG tablet Take 5 mg by mouth 2 (two) times daily.  . polyethylene glycol (MIRALAX / GLYCOLAX) packet Take 17 g by mouth daily.  . vitamin C (ASCORBIC ACID) 500 MG tablet Take 500 mg by mouth 2 (two) times daily.    No facility-administered encounter medications on file as of 09/18/2017.     Allergies as of 09/18/2017  . (No Known Allergies)    Past Medical History:  Diagnosis Date  . Aggressive outburst   . Cognitive communication deficit   . Colostomy in place [facility])   . Drug-induced Parkinsonism ([place] [facility])   . Dysphagia   . Dysphagia   . Hard of hearing    Wears bilateral hearing aides  . Hernia   . High cholesterol   . HOH (hard of hearing)   . Hydrocele, left   . Left inguinal hernia   . Malnutrition ([place])   . Malnutrition ([place])   . Schizoaffective disorder ([place])   . Schizophrenia ([place])   . Scrotal hernia    left  . Seizures ([place])   . Thyroid disease   . TIA (transient ischemic attack)     Past Surgical History:  Procedure Laterality Date  . ESOPHAGOGASTRODUODENOSCOPY (EGD) WITH PROPOFOL N/A 09/08/2014   Procedure: ESOPHAGOGASTRODUODENOSCOPY (EGD) WITH PROPOFOL;  Surgeon: Georganna Skeans, MD;  Location: [facility];  Service: Endoscopy;  Laterality: N/A;  . LAPAROTOMY N/A 09/01/2014   Procedure: EXPLORATORY LAPAROTOMY, SIGMOID COLECTOMY, SIGMOID COLOSTOMY, PRIMARY REPAIR OF STRANGULATED INGUINAL HERNIA.;  Surgeon: Rolm Bookbinder, MD;  Location: [facility];  Service: General;  Laterality: N/A;  . LEG SURGERY    . PEG PLACEMENT N/A 09/08/2014   Procedure: PERCUTANEOUS ENDOSCOPIC GASTROSTOMY (PEG) PLACEMENT;  Surgeon: Georganna Skeans, MD;  Location: [facility];  Service: Endoscopy;  Laterality: N/A;  . PEG PLACEMENT N/A 06/30/2015   Procedure:  PERCUTANEOUS ENDOSCOPIC GASTROSTOMY (PEG) REPLACEMENT;  Surgeon: Daneil Dolin, MD;  [facility];  Service: Endoscopy;  Laterality: N/A;  1100  . PEG PLACEMENT N/A 12/30/2015   Procedure: PERCUTANEOUS ENDOSCOPIC GASTROSTOMY (PEG) REPLACEMENT;  Surgeon: Daneil Dolin, MD;  [facility];  Service: Endoscopy;  Laterality: N/A;  . PEG PLACEMENT N/A 08/24/2016   Procedure: PERCUTANEOUS ENDOSCOPIC GASTROSTOMY (PEG) REPLACEMENT;  Surgeon: Daneil Dolin, MD;  [facility];  Service: Endoscopy;  Laterality: N/A;  . TESTICLE SURGERY      Family History  Family history unknown: Yes    Social History   Socioeconomic History  . Marital status: Single    Spouse name: Not on file  . Number of children: Not on file  . Years of education: Not on file  . Highest education level: Not on file  Occupational History  . Not on file  Social Needs  . Financial resource strain: Not  on file  . Food insecurity:    Worry: Not on file    Inability: Not on file  . Transportation needs:    Medical: Not on file    Non-medical: Not on file  Tobacco Use  . Smoking status: Current Every Day Smoker  . Smokeless tobacco: Never Used  Substance and Sexual Activity  . Alcohol use: No  . Drug use: No  . Sexual activity: Never  Lifestyle  . Physical activity:    Days per week: Not on file    Minutes per session: Not on file  . Stress: Not on file  Relationships  . Social connections:    Talks on phone: Not on file    Gets together: Not on file    Attends religious service: Not on file    Active member of club or organization: Not on file    Attends meetings of clubs or organizations: Not on file    Relationship status: Not on file  . Intimate partner violence:    Fear of current or ex partner: Not on file    Emotionally abused: Not on file    Physically abused: Not on file    Forced sexual activity: Not on file  Other Topics Concern  . Not on file  Social History  Narrative  . Not on file    Review of systems: Review of Systems  Constitutional: Negative for fever and chills.  HENT: Negative.   Eyes: Negative for blurred vision.  Respiratory: as per HPI  Cardiovascular: Negative for chest pain and palpitations.  Gastrointestinal: Negative for vomiting, diarrhea, blood per rectum. Genitourinary: Negative for dysuria, urgency, frequency and hematuria.  Musculoskeletal: Negative for myalgias, back pain and joint pain.  Skin: Negative for itching and rash.  Neurological: Negative for dizziness, tremors, focal weakness, seizures and loss of consciousness.  Endo/Heme/Allergies: Negative for environmental allergies.  Psychiatric/Behavioral: Negative for depression, suicidal ideas and hallucinations.  All other systems reviewed and are negative.   Marshell Garfinkel MD [facility] 09/18/2017, 10:42 AM  CC: Hilbert Corrigan*

## 2017-09-19 ENCOUNTER — Ambulatory Visit (HOSPITAL_COMMUNITY): Payer: Medicare Other | Admitting: Anesthesiology

## 2017-09-19 ENCOUNTER — Ambulatory Visit (HOSPITAL_COMMUNITY)
Admission: RE | Admit: 2017-09-19 | Discharge: 2017-09-19 | Disposition: A | Discharge status: Skilled Nursing Facility | Payer: Medicare Other | Source: Ambulatory Visit | Attending: General Surgery | Admitting: General Surgery

## 2017-09-19 ENCOUNTER — Encounter (HOSPITAL_COMMUNITY)
Admission: RE | Disposition: A | Discharge status: Skilled Nursing Facility | Payer: Self-pay | Source: Ambulatory Visit | Attending: General Surgery

## 2017-09-19 ENCOUNTER — Encounter (HOSPITAL_COMMUNITY): Payer: Self-pay

## 2017-09-19 DIAGNOSIS — E079 Disorder of thyroid, unspecified: Secondary | ICD-10-CM | POA: Insufficient documentation

## 2017-09-19 DIAGNOSIS — R569 Unspecified convulsions: Secondary | ICD-10-CM | POA: Insufficient documentation

## 2017-09-19 DIAGNOSIS — E46 Unspecified protein-calorie malnutrition: Secondary | ICD-10-CM | POA: Diagnosis not present

## 2017-09-19 DIAGNOSIS — Z933 Colostomy status: Secondary | ICD-10-CM | POA: Diagnosis not present

## 2017-09-19 DIAGNOSIS — Z79899 Other long term (current) drug therapy: Secondary | ICD-10-CM | POA: Insufficient documentation

## 2017-09-19 DIAGNOSIS — F259 Schizoaffective disorder, unspecified: Secondary | ICD-10-CM | POA: Diagnosis not present

## 2017-09-19 DIAGNOSIS — H9193 Unspecified hearing loss, bilateral: Secondary | ICD-10-CM | POA: Insufficient documentation

## 2017-09-19 DIAGNOSIS — F17200 Nicotine dependence, unspecified, uncomplicated: Secondary | ICD-10-CM | POA: Insufficient documentation

## 2017-09-19 DIAGNOSIS — R4189 Other symptoms and signs involving cognitive functions and awareness: Secondary | ICD-10-CM | POA: Insufficient documentation

## 2017-09-19 DIAGNOSIS — E7800 Pure hypercholesterolemia, unspecified: Secondary | ICD-10-CM | POA: Diagnosis not present

## 2017-09-19 DIAGNOSIS — G2119 Other drug induced secondary parkinsonism: Secondary | ICD-10-CM | POA: Insufficient documentation

## 2017-09-19 DIAGNOSIS — Z8673 Personal history of transient ischemic attack (TIA), and cerebral infarction without residual deficits: Secondary | ICD-10-CM | POA: Diagnosis not present

## 2017-09-19 DIAGNOSIS — K4090 Unilateral inguinal hernia, without obstruction or gangrene, not specified as recurrent: Secondary | ICD-10-CM

## 2017-09-19 DIAGNOSIS — R1310 Dysphagia, unspecified: Secondary | ICD-10-CM | POA: Insufficient documentation

## 2017-09-19 DIAGNOSIS — R062 Wheezing: Secondary | ICD-10-CM | POA: Insufficient documentation

## 2017-09-19 HISTORY — PX: INGUINAL HERNIA REPAIR: SHX194

## 2017-09-19 LAB — LACTATE DEHYDROGENASE: LDH: 99 U/L — ABNORMAL LOW (ref 120–250)

## 2017-09-19 SURGERY — REPAIR, HERNIA, INGUINAL, ADULT
Anesthesia: General | Site: Groin | Laterality: Left

## 2017-09-19 MED ORDER — 0.9 % SODIUM CHLORIDE (POUR BTL) OPTIME
TOPICAL | Status: DC | PRN
  Administered 2017-09-19: 1000 mL

## 2017-09-19 MED ORDER — FENTANYL CITRATE (PF) 100 MCG/2ML IJ SOLN
25.0000 ug | INTRAMUSCULAR | Status: DC | PRN
  Administered 2017-09-19: 50 ug via INTRAVENOUS

## 2017-09-19 MED ORDER — LIDOCAINE HCL (CARDIAC) PF 50 MG/5ML IV SOSY
PREFILLED_SYRINGE | INTRAVENOUS | Status: DC | PRN
  Administered 2017-09-19: 40 mg via INTRAVENOUS

## 2017-09-19 MED ORDER — ARTIFICIAL TEARS OPHTHALMIC OINT
TOPICAL_OINTMENT | OPHTHALMIC | Status: DC | PRN
  Administered 2017-09-19: 1 via OPHTHALMIC

## 2017-09-19 MED ORDER — ONDANSETRON HCL 4 MG/2ML IJ SOLN
INTRAMUSCULAR | Status: DC | PRN
  Administered 2017-09-19: 4 mg via INTRAVENOUS

## 2017-09-19 MED ORDER — KETOROLAC TROMETHAMINE 30 MG/ML IJ SOLN
30.0000 mg | Freq: Once | INTRAMUSCULAR | Status: AC
  Administered 2017-09-19: 30 mg via INTRAVENOUS

## 2017-09-19 MED ORDER — CHLORHEXIDINE GLUCONATE CLOTH 2 % EX PADS: 6.0000 | MEDICATED_PAD | Freq: Once | CUTANEOUS | Status: DC

## 2017-09-19 MED ORDER — KETOROLAC TROMETHAMINE 30 MG/ML IJ SOLN
INTRAMUSCULAR | Status: AC
  Filled 2017-09-19: qty 1

## 2017-09-19 MED ORDER — ALBUTEROL SULFATE HFA 108 (90 BASE) MCG/ACT IN AERS
INHALATION_SPRAY | RESPIRATORY_TRACT | Status: DC | PRN
  Administered 2017-09-19: 4 via RESPIRATORY_TRACT
  Administered 2017-09-19: 5 via RESPIRATORY_TRACT

## 2017-09-19 MED ORDER — POVIDONE-IODINE 10 % EX OINT
TOPICAL_OINTMENT | CUTANEOUS | Status: AC
  Filled 2017-09-19: qty 1

## 2017-09-19 MED ORDER — LIDOCAINE HCL (PF) 1 % IJ SOLN
INTRAMUSCULAR | Status: AC
  Filled 2017-09-19: qty 5

## 2017-09-19 MED ORDER — CEFAZOLIN SODIUM-DEXTROSE 2-4 GM/100ML-% IV SOLN
2.0000 g | INTRAVENOUS | Status: AC
  Administered 2017-09-19: 2 g via INTRAVENOUS
  Filled 2017-09-19: qty 100

## 2017-09-19 MED ORDER — DEXAMETHASONE SODIUM PHOSPHATE 4 MG/ML IJ SOLN
INTRAMUSCULAR | Status: DC | PRN
  Administered 2017-09-19: 4 mg via INTRAVENOUS

## 2017-09-19 MED ORDER — GLYCOPYRROLATE PF 0.2 MG/ML IJ SOSY
PREFILLED_SYRINGE | INTRAMUSCULAR | Status: DC | PRN
  Administered 2017-09-19: .2 mg via INTRAVENOUS

## 2017-09-19 MED ORDER — SUGAMMADEX SODIUM 200 MG/2ML IV SOLN
INTRAVENOUS | Status: AC
  Filled 2017-09-19: qty 2

## 2017-09-19 MED ORDER — SUGAMMADEX SODIUM 200 MG/2ML IV SOLN
INTRAVENOUS | Status: DC | PRN
  Administered 2017-09-19: 154.2 mg via INTRAVENOUS

## 2017-09-19 MED ORDER — SUCCINYLCHOLINE CHLORIDE 20 MG/ML IJ SOLN
INTRAMUSCULAR | Status: DC | PRN
  Administered 2017-09-19: 120 mg via INTRAVENOUS

## 2017-09-19 MED ORDER — FENTANYL CITRATE (PF) 100 MCG/2ML IJ SOLN
INTRAMUSCULAR | Status: AC
  Filled 2017-09-19: qty 2

## 2017-09-19 MED ORDER — BUPIVACAINE LIPOSOME 1.3 % IJ SUSP
INTRAMUSCULAR | Status: AC
  Filled 2017-09-19: qty 20

## 2017-09-19 MED ORDER — SUCCINYLCHOLINE CHLORIDE 20 MG/ML IJ SOLN
INTRAMUSCULAR | Status: AC
  Filled 2017-09-19: qty 1

## 2017-09-19 MED ORDER — FENTANYL CITRATE (PF) 100 MCG/2ML IJ SOLN
INTRAMUSCULAR | Status: DC | PRN
  Administered 2017-09-19 (×3): 50 ug via INTRAVENOUS
  Administered 2017-09-19: 100 ug via INTRAVENOUS

## 2017-09-19 MED ORDER — ROCURONIUM BROMIDE 50 MG/5ML IV SOSY
PREFILLED_SYRINGE | INTRAVENOUS | Status: DC | PRN
  Administered 2017-09-19: 10 mg via INTRAVENOUS
  Administered 2017-09-19: 30 mg via INTRAVENOUS

## 2017-09-19 MED ORDER — BUPIVACAINE LIPOSOME 1.3 % IJ SUSP
INTRAMUSCULAR | Status: DC | PRN
  Administered 2017-09-19: 20 mL

## 2017-09-19 MED ORDER — FENTANYL CITRATE (PF) 250 MCG/5ML IJ SOLN
INTRAMUSCULAR | Status: AC
  Filled 2017-09-19: qty 5

## 2017-09-19 MED ORDER — LACTATED RINGERS IV SOLN
INTRAVENOUS | Status: DC
  Administered 2017-09-19 (×2): via INTRAVENOUS

## 2017-09-19 MED ORDER — HYDROCODONE-ACETAMINOPHEN 7.5-325 MG PO TABS: 1.0000 | ORAL_TABLET | Freq: Once | ORAL | Status: DC | PRN

## 2017-09-19 MED ORDER — HYDROCODONE-ACETAMINOPHEN 5-325 MG PO TABS: 1.0000 | ORAL_TABLET | ORAL | 0 refills | Status: AC | PRN

## 2017-09-19 MED ORDER — PROPOFOL 10 MG/ML IV BOLUS
INTRAVENOUS | Status: AC
  Filled 2017-09-19: qty 40

## 2017-09-19 MED ORDER — MIDAZOLAM HCL 2 MG/2ML IJ SOLN
INTRAMUSCULAR | Status: AC
  Filled 2017-09-19: qty 2

## 2017-09-19 MED ORDER — POVIDONE-IODINE 10 % OINT PACKET
TOPICAL_OINTMENT | CUTANEOUS | Status: DC | PRN
  Administered 2017-09-19: 1 via TOPICAL

## 2017-09-19 SURGICAL SUPPLY — 39 items
CLOTH BEACON ORANGE TIMEOUT ST (SAFETY) ×3 IMPLANT
COVER LIGHT HANDLE STERIS (MISCELLANEOUS) ×6 IMPLANT
DERMABOND ADVANCED (GAUZE/BANDAGES/DRESSINGS) ×2
DERMABOND ADVANCED .7 DNX12 (GAUZE/BANDAGES/DRESSINGS) ×1 IMPLANT
DRAIN PENROSE 18X1/2 LTX STRL (DRAIN) ×3 IMPLANT
ELECT REM PT RETURN 9FT ADLT (ELECTROSURGICAL) ×3
ELECTRODE REM PT RTRN 9FT ADLT (ELECTROSURGICAL) ×1 IMPLANT
GAUZE SPONGE 4X4 12PLY STRL (GAUZE/BANDAGES/DRESSINGS) ×3 IMPLANT
GLOVE BIOGEL PI IND STRL 6.5 (GLOVE) ×1 IMPLANT
GLOVE BIOGEL PI IND STRL 7.0 (GLOVE) ×4 IMPLANT
GLOVE BIOGEL PI INDICATOR 6.5 (GLOVE) ×2
GLOVE BIOGEL PI INDICATOR 7.0 (GLOVE) ×8
GLOVE SURG SS PI 6.5 STRL IVOR (GLOVE) ×3 IMPLANT
GLOVE SURG SS PI 7.5 STRL IVOR (GLOVE) ×3 IMPLANT
GOWN STRL REUS W/ TWL XL LVL3 (GOWN DISPOSABLE) ×1 IMPLANT
GOWN STRL REUS W/TWL LRG LVL3 (GOWN DISPOSABLE) ×6 IMPLANT
GOWN STRL REUS W/TWL XL LVL3 (GOWN DISPOSABLE) ×2
INST SET MINOR GENERAL (KITS) ×3 IMPLANT
KIT TURNOVER KIT A (KITS) ×3 IMPLANT
MANIFOLD NEPTUNE II (INSTRUMENTS) ×3 IMPLANT
MESH VENTRALEX ST 8CM LRG (Mesh General) ×3 IMPLANT
NEEDLE HYPO 21X1.5 SAFETY (NEEDLE) ×3 IMPLANT
NS IRRIG 1000ML POUR BTL (IV SOLUTION) ×3 IMPLANT
PACK MINOR (CUSTOM PROCEDURE TRAY) ×3 IMPLANT
PAD ARMBOARD 7.5X6 YLW CONV (MISCELLANEOUS) ×3 IMPLANT
SET BASIN LINEN APH (SET/KITS/TRAYS/PACK) ×3 IMPLANT
SOL PREP PROV IODINE SCRUB 4OZ (MISCELLANEOUS) ×3 IMPLANT
SPONGE LAP 18X18 X RAY DECT (DISPOSABLE) ×3 IMPLANT
STAPLER VISISTAT (STAPLE) ×6 IMPLANT
SUT MNCRL AB 4-0 PS2 18 (SUTURE) ×3 IMPLANT
SUT NOVA NAB GS-22 2 2-0 T-19 (SUTURE) ×3 IMPLANT
SUT PROLENE 2 0 SH 30 (SUTURE) ×9 IMPLANT
SUT VIC AB 2-0 CT1 27 (SUTURE) ×4
SUT VIC AB 2-0 CT1 TAPERPNT 27 (SUTURE) ×2 IMPLANT
SUT VIC AB 3-0 SH 27 (SUTURE) ×2
SUT VIC AB 3-0 SH 27X BRD (SUTURE) ×1 IMPLANT
SUT VICRYL AB 3 0 TIES (SUTURE) ×3 IMPLANT
SYR 20CC LL (SYRINGE) ×3 IMPLANT
TAPE PAPER 2X10 WHT MICROPORE (GAUZE/BANDAGES/DRESSINGS) ×3 IMPLANT

## 2017-09-19 NOTE — Op Note (Signed)
Patient:  Steven David  DOB:  05/24/1955  MRN:  [id number]   Preop Diagnosis: Left inguinal hernia  Postop Diagnosis: Same  Procedure: Left inguinal herniorrhaphy with mesh  Surgeon: Aviva Signs, MD  Anes: General endotracheal  Indications: Patient is a 62 year old white male with multiple medical problems including schizophrenia who resides in a nursing home and was referred to my care for a large left inguinal hernia.  Due to ongoing problems with voiding, the patient now presents for a left inguinal herniorrhaphy.  The risks and benefits of the procedure including bleeding, infection, mesh use, and the possibility of recurrence of the hernia were fully explained to the patient, who gave informed consent.  Procedure note: The patient was placed in the supine position.  After induction of general endotracheal anesthesia, the left groin and scrotal region were prepped and draped using usual sterile technique with Betadine.  Surgical site confirmation was performed.  Incision was made in the left groin region.  The dissection was taken down to the external oblique aponeurosis which was very attenuated.  The patient had a hernia sac which contained small intestine extending into the scrotum.  This was freed away from its attachments.  The hernia sac was entered into in order to reduce the small intestine.  Once this was done, an 8 cm Bard ventralax st patch was inserted and secured to the surrounding fascia and Poupart's ligament using 2-0 Prolene interrupted sutures.  This was a direct hernia and no floor of the inguinal canal could be found.  The excess hernia sac was excised down to its base and this layer was closed using a running 2-0 Prolene suture.  The spermatic cord was inspected and noted to be within normal limits.  The left testicle was somewhat enlarged but within normal limits.  The attenuated external oblique aponeurosis was reapproximated using a 2-0 vicryl running suture.  The  subcutaneous layer was reapproximated using a 3-0 Vicryl interrupted suture.  Exparel was instilled into the surrounding wound.  The incision was closed using staples.  Betadine ointment and dry sterile dressings were applied.  All tape and needle counts were correct at the end of the procedure.  The patient was extubated in the operating room and transferred to PACU in stable condition.  Complications: None  EBL: 50 cc  Specimen: None

## 2017-09-19 NOTE — Anesthesia Procedure Notes (Signed)
Procedure Name: Intubation Date/Time: 09/19/2017 7:36 AM Performed by: Andree Elk, Amy A, CRNA Pre-anesthesia Checklist: Patient identified, Patient being monitored, Timeout performed, Emergency Drugs available and Suction available Patient Re-evaluated:Patient Re-evaluated prior to induction Oxygen Delivery Method: Circle system utilized Preoxygenation: Pre-oxygenation with 100% oxygen Induction Type: IV induction, Cricoid Pressure applied and Rapid sequence Laryngoscope Size: 3 and Miller Grade View: Grade I Tube type: Oral Tube size: 7.0 mm Number of attempts: 1 Airway Equipment and Method: Stylet Placement Confirmation: ETT inserted through vocal cords under direct vision,  positive ETCO2 and breath sounds checked- equal and bilateral Secured at: 21 cm Tube secured with: Tape Dental Injury: Teeth and Oropharynx as per pre-operative assessment

## 2017-09-19 NOTE — Interval H&P Note (Signed)
History and Physical Interval Note:  09/19/2017 7:12 AM  Steven David  has presented today for surgery, with the diagnosis of left inguinal hernia  The various methods of treatment have been discussed with the patient and family. After consideration of risks, benefits and other options for treatment, the patient has consented to  Procedure(s): HERNIA REPAIR INGUINAL ADULT WITH MESH (Left) as a surgical intervention .  The patient's history has been reviewed, patient examined, no change in status, stable for surgery.  I have reviewed the patient's chart and labs.  Questions were answered to the patient's satisfaction.     Aviva Signs

## 2017-09-19 NOTE — Anesthesia Preprocedure Evaluation (Addendum)
Anesthesia Evaluation  Patient identified by MRN, date of birth, ID band Patient awake    Reviewed: Allergy & Precautions, NPO status , Patient's Chart, lab work & pertinent test results  Airway Mallampati: II  TM Distance: >3 FB Neck ROM: Full    Dental no notable dental hx. (+) Edentulous Upper, Edentulous Lower   Pulmonary neg pulmonary ROS, pneumonia, resolved, Current Smoker,   Decrease BS-Bilat  Pulmonary exam normal  + decreased breath sounds+ wheezing      Cardiovascular Exercise Tolerance: Good negative cardio ROS Normal cardiovascular examI Rhythm:Regular Rate:Normal     Neuro/Psych Seizures -, Well Controlled,  Schizophrenia Poor historian -difficult to understand -garbled speech TIAnegative neurological ROS  negative psych ROS   GI/Hepatic negative GI ROS, Neg liver ROS,   Endo/Other  negative endocrine ROSHypothyroidism   Renal/GU Renal InsufficiencyRenal diseasenegative Renal ROS  negative genitourinary   Musculoskeletal negative musculoskeletal ROS (+)   Abdominal   Peds negative pediatric ROS (+)  Hematology negative hematology ROS (+) anemia ,   Anesthesia Other Findings   Reproductive/Obstetrics negative OB ROS                            Anesthesia Physical Anesthesia Plan  ASA: III  Anesthesia Plan: General   Post-op Pain Management:    Induction: Intravenous  PONV Risk Score and Plan:   Airway Management Planned: Oral ETT and LMA  Additional Equipment:   Intra-op Plan:   Post-operative Plan: Extubation in OR  Informed Consent: I have reviewed the patients History and Physical, chart, labs and discussed the procedure including the risks, benefits and alternatives for the proposed anesthesia with the patient or authorized representative who has indicated his/her understanding and acceptance.   Dental advisory given  Plan Discussed with:  CRNA  Anesthesia Plan Comments: (LMA vs ETT , pt with a large hernia )        Anesthesia Quick Evaluation

## 2017-09-19 NOTE — Anesthesia Postprocedure Evaluation (Signed)
Anesthesia Post Note Late Entry for 0950  Patient: Steven David  Procedure(s) Performed: HERNIA REPAIR INGUINAL ADULT WITH MESH (Left Groin)  Patient location during evaluation: PACU Anesthesia Type: General Level of consciousness: awake and alert ( at baseline; ) Pain management: pain level controlled Vital Signs Assessment: post-procedure vital signs reviewed and stable Respiratory status: spontaneous breathing Cardiovascular status: stable Postop Assessment: no apparent nausea or vomiting Anesthetic complications: no     Last Vitals:  Vitals:   09/19/17 1030 09/19/17 1045  BP: 114/77 116/76  Pulse: (!) 108 (!) 115  Resp: 12 13  Temp:    SpO2: 97% 90%    Last Pain:  Vitals:   09/19/17 0646  TempSrc: Oral  PainSc: 0-No pain                 Adrianne Shackleton A

## 2017-09-19 NOTE — Discharge Instructions (Signed)
Open Hernia Repair, Adult, Care After This sheet gives you information about how to care for yourself after your procedure. Your health care provider may also give you more specific instructions. If you have problems or questions, contact your health care provider. What can I expect after the procedure? After the procedure, it is common to have:  Mild discomfort.  Slight bruising.  Minor swelling.  Pain in the abdomen.  Follow these instructions at home: Incision care   Follow instructions from your health care provider about how to take care of your incision area. Make sure you: ? Wash your hands with soap and water before you change your bandage (dressing). If soap and water are not available, use hand sanitizer. ? Change your dressing as told by your health care provider. ? Leave stitches (sutures), skin glue, or adhesive strips in place. These skin closures may need to stay in place for 2 weeks or longer. If adhesive strip edges start to loosen and curl up, you may trim the loose edges. Do not remove adhesive strips completely unless your health care provider tells you to do that.  Check your incision area every day for signs of infection. Check for: ? More redness, swelling, or pain. ? More fluid or blood. ? Warmth. ? Pus or a bad smell. Activity  Do not drive or use heavy machinery while taking prescription pain medicine. Do not drive until your health care provider approves.  Until your health care provider approves: ? Do not lift anything that is heavier than 10 lb (4.5 kg). ? Do not play contact sports.  Return to your normal activities as told by your health care provider. Ask your health care provider what activities are safe. General instructions  To prevent or treat constipation while you are taking prescription pain medicine, your health care provider may recommend that you: ? Drink enough fluid to keep your urine clear or pale yellow. ? Take over-the-counter or  prescription medicines. ? Eat foods that are high in fiber, such as fresh fruits and vegetables, whole grains, and beans. ? Limit foods that are high in fat and processed sugars, such as fried and sweet foods.  Take over-the-counter and prescription medicines only as told by your health care provider.  Do not take tub baths or go swimming until your health care provider approves.  Keep all follow-up visits as told by your health care provider. This is important. Contact a health care provider if:  You develop a rash.  You have more redness, swelling, or pain around your incision.  You have more fluid or blood coming from your incision.  Your incision feels warm to the touch.  You have pus or a bad smell coming from your incision.  You have a fever or chills.  You have blood in your stool (feces).  You have not had a bowel movement in 2-3 days.  Your pain is not controlled with medicine. Get help right away if:  You have chest pain or shortness of breath.  You feel light-headed or feel faint.  You have severe pain.  You vomit and your pain is worse. This information is not intended to replace advice given to you by your health care provider. Make sure you discuss any questions you have with your health care provider. Document Released: 07/15/2004 Document Revised: 07/16/2015 Document Reviewed: 06/09/2015 Elsevier Interactive Patient Education  2018 [employer] Anesthesia, Adult, Care After These instructions provide you with information about caring  for yourself after your procedure. Your health care provider may also give you more specific instructions. Your treatment has been planned according to current medical practices, but problems sometimes occur. Call your health care provider if you have any problems or questions after your procedure. What can I expect after the procedure? After the procedure, it is common to have:  Vomiting.  A sore  throat.  Mental slowness.  It is common to feel:  Nauseous.  Cold or shivery.  Sleepy.  Tired.  Sore or achy, even in parts of your body where you did not have surgery.  Follow these instructions at home: For at least 24 hours after the procedure:  Do not: ? Participate in activities where you could fall or become injured. ? Drive. ? Use heavy machinery. ? Drink alcohol. ? Take sleeping pills or medicines that cause drowsiness. ? Make important decisions or sign legal documents. ? Take care of children on your own.  Rest. Eating and drinking  If you vomit, drink water, juice, or soup when you can drink without vomiting.  Drink enough fluid to keep your urine clear or pale yellow.  Make sure you have little or no nausea before eating solid foods.  Follow the diet recommended by your health care provider. General instructions  Have a responsible adult stay with you until you are awake and alert.  Return to your normal activities as told by your health care provider. Ask your health care provider what activities are safe for you.  Take over-the-counter and prescription medicines only as told by your health care provider.  If you smoke, do not smoke without supervision.  Keep all follow-up visits as told by your health care provider. This is important. Contact a health care provider if:  You continue to have nausea or vomiting at home, and medicines are not helpful.  You cannot drink fluids or start eating again.  You cannot urinate after 8-12 hours.  You develop a skin rash.  You have fever.  You have increasing redness at the site of your procedure. Get help right away if:  You have difficulty breathing.  You have chest pain.  You have unexpected bleeding.  You feel that you are having a life-threatening or urgent problem. This information is not intended to replace advice given to you by your health care provider. Make sure you discuss any  questions you have with your health care provider. Document Released: 04/03/2000 Document Revised: 05/31/2015 Document Reviewed: 12/10/2014 Elsevier Interactive Patient Education  [employer].

## 2017-09-19 NOTE — Transfer of Care (Signed)
Immediate Anesthesia Transfer of Care Note  Patient: Steven David  Procedure(s) Performed: HERNIA REPAIR INGUINAL ADULT WITH MESH (Left Groin)  Patient Location: PACU  Anesthesia Type:General  Level of Consciousness: awake, oriented and patient cooperative  Airway & Oxygen Therapy: Patient Spontanous Breathing and Patient connected to face mask oxygen  Post-op Assessment: Report given to RN and Post -op Vital signs reviewed and stable  Post vital signs: Reviewed and stable  Last Vitals:  Vitals Value Taken Time  BP 134/96 09/19/2017  9:20 AM  Temp    Pulse 112 09/19/2017  9:23 AM  Resp 25 09/19/2017  9:23 AM  SpO2 100 % 09/19/2017  9:23 AM  Vitals shown include unvalidated device data.  Last Pain:  Vitals:   09/19/17 0646  TempSrc: Oral  PainSc: 0-No pain         Complications: No apparent anesthesia complications

## 2017-09-20 ENCOUNTER — Telehealth: Payer: Self-pay | Admitting: Emergency Medicine

## 2017-09-20 ENCOUNTER — Ambulatory Visit (HOSPITAL_COMMUNITY): Admission: RE | Admit: 2017-09-20 | Payer: Medicare Other | Source: Ambulatory Visit

## 2017-09-20 NOTE — Telephone Encounter (Signed)
Nurse kim called from [facility] to notify me that the patient is blooding from surgical sight. She changd his pressure dressing but he is still bleeding. After notifying doctor jenkins he notified me to tell kim to change the dressing 1 more time and apply a 5 pound sand bag to surgical sight, he also stated would go by the facility and check on the patient in the morning . I notified kim and told her if she has anymore questions to call me back, we would be here until 4p after that take patient to ER she stated she understood and that she would.

## 2017-09-21 ENCOUNTER — Encounter (HOSPITAL_COMMUNITY): Payer: Self-pay | Admitting: General Surgery

## 2017-09-28 IMAGING — DX DG CHEST 2V
2 series · 2 of 2 positions shown · non-contrast
Comparison: 09/03/2014

CLINICAL DATA: Weakness.

EXAM:
CHEST  2 VIEW

[chest lat]
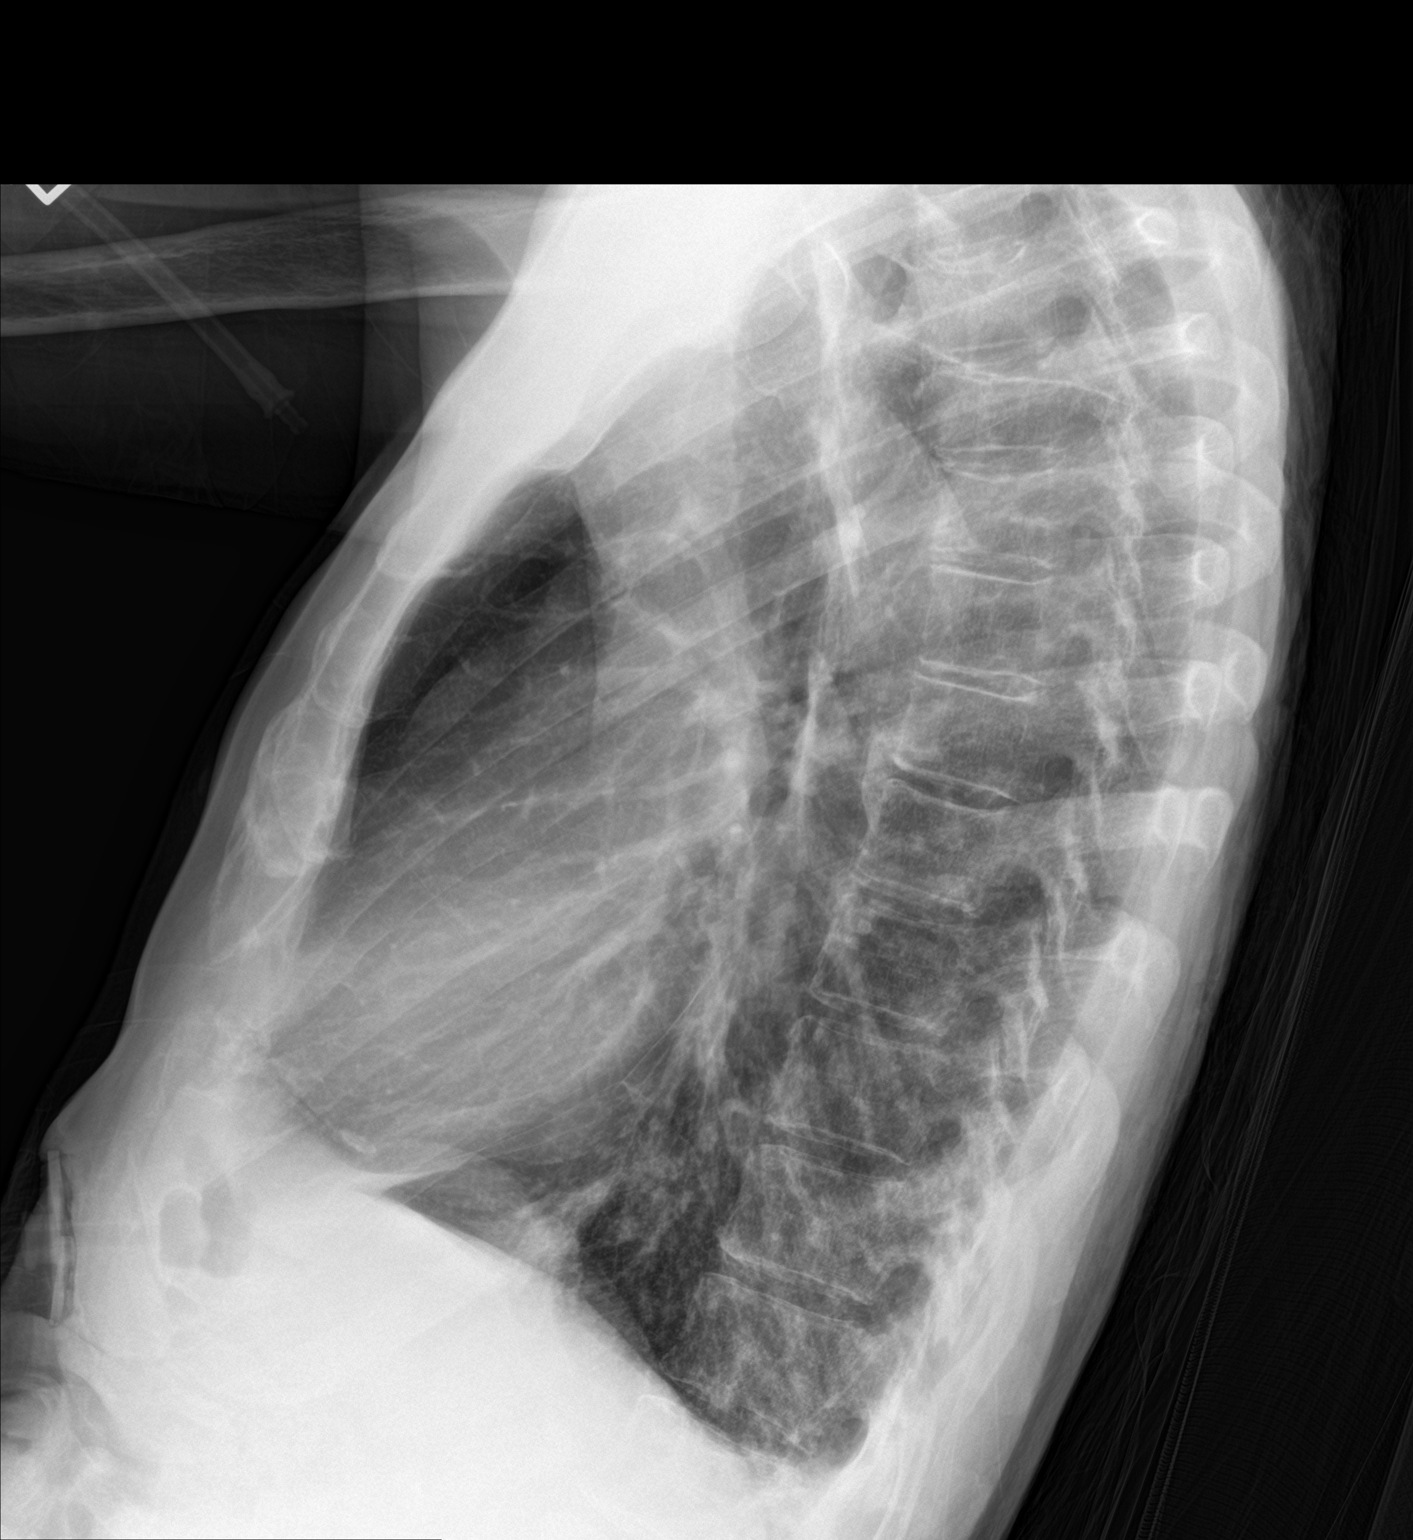

[chest ap]
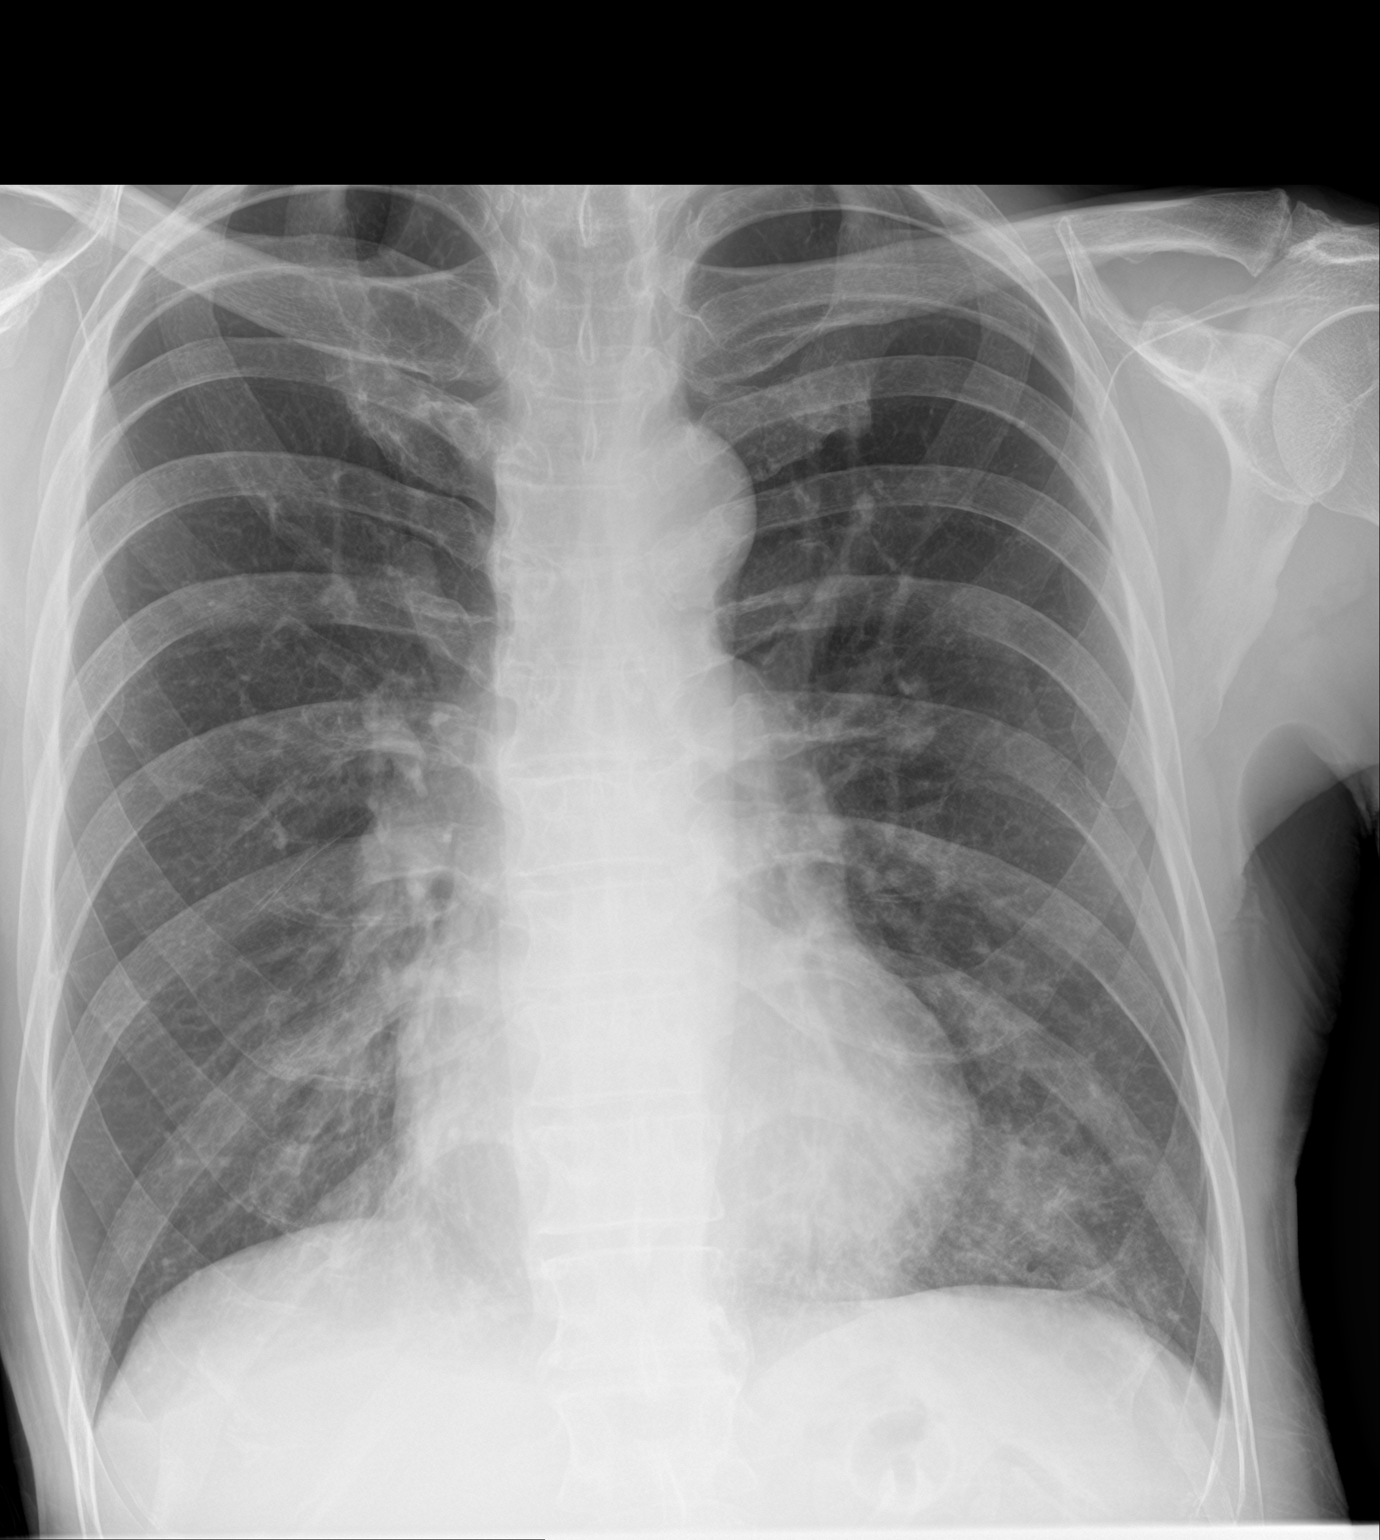

[2 of 2 positions shown; findings below may reference images not displayed]

FINDINGS: Asymmetric airspace opacity in the left lower lobe with reticular
nodular appearance. Normal heart size and mediastinal contours. No
edema, effusion, or pneumothorax.
IMPRESSION: Left lower lobe airspace disease which could reflect pneumonia or
aspiration.

## 2017-09-29 IMAGING — CR DG CHEST 1V PORT
1 series · 1 of 1 positions shown · non-contrast
Comparison: Portable exam 2877 hours compared to 11/19/2014

CLINICAL DATA: PICC line placement

EXAM:
PORTABLE CHEST 1 VIEW

[ap portable]
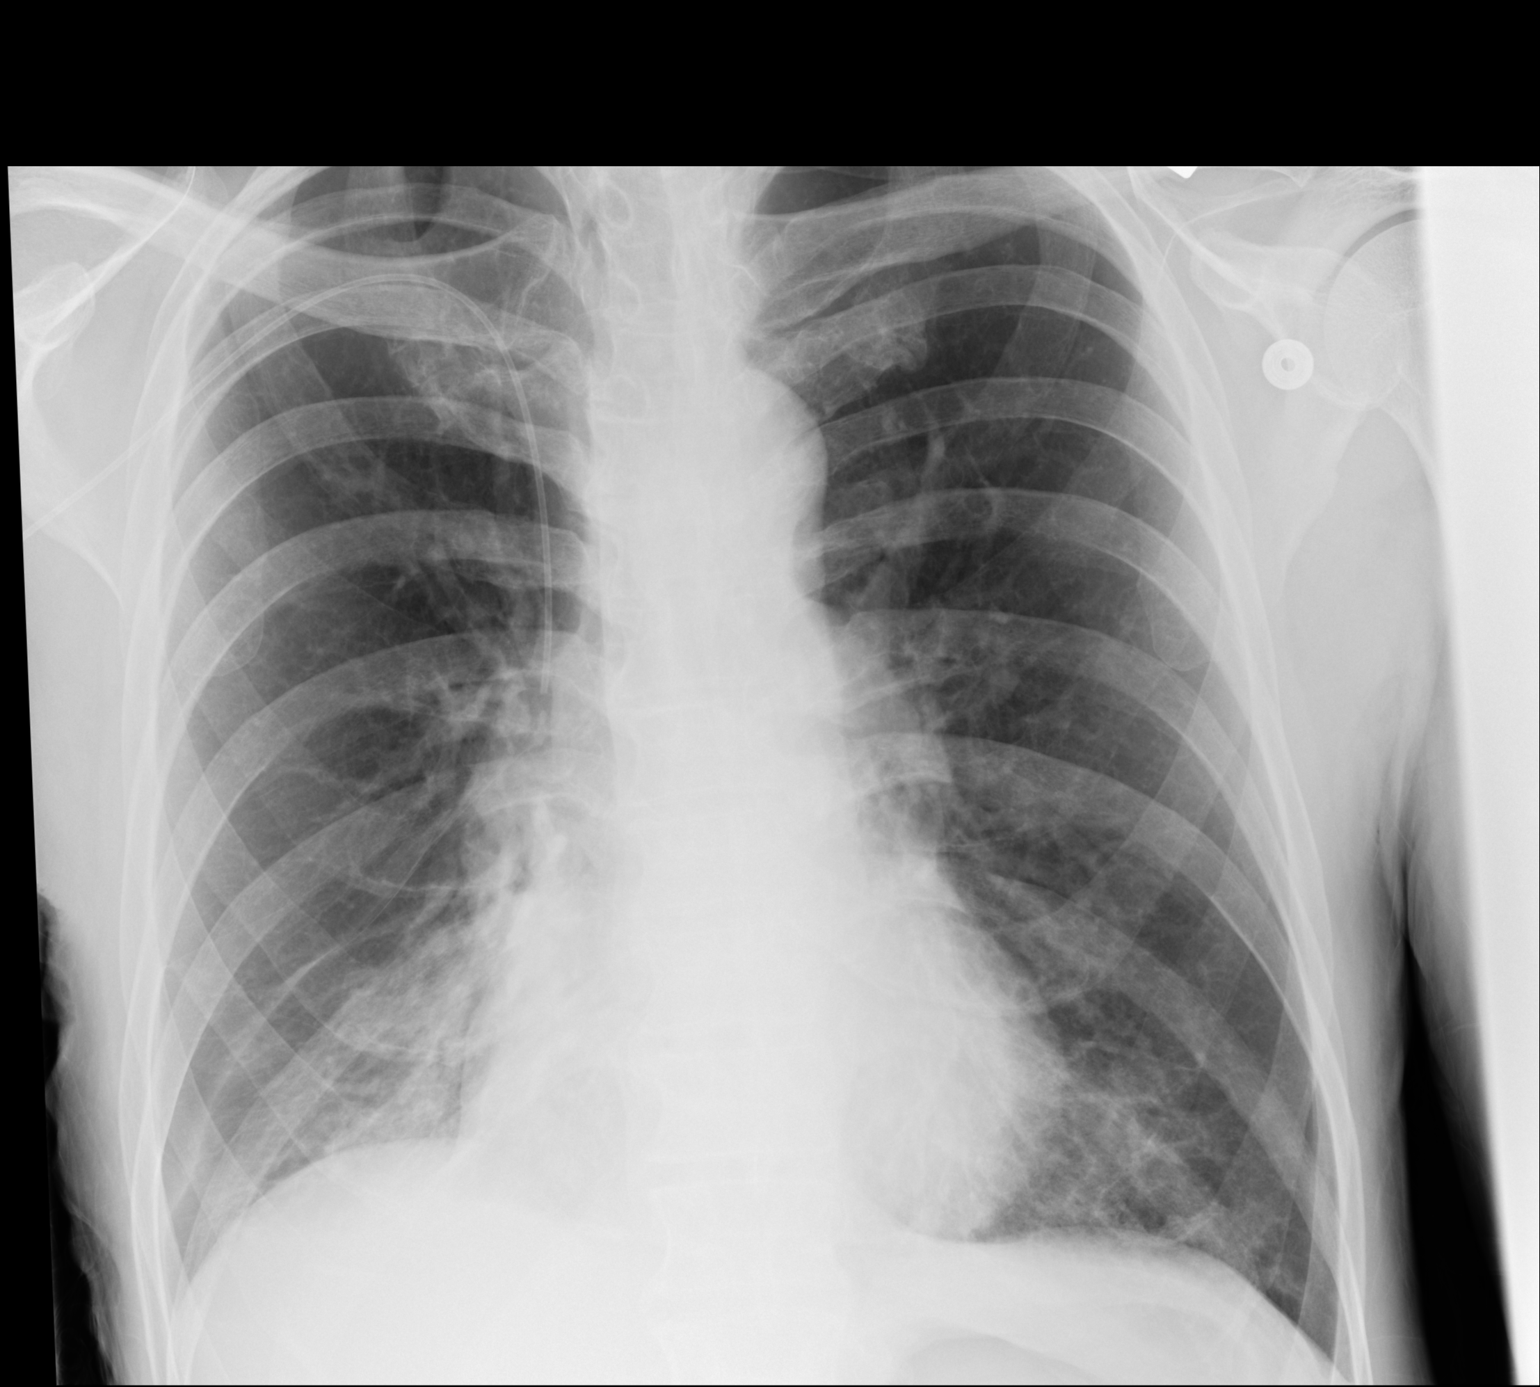

[1 of 1 positions shown; findings below may reference images not displayed]

FINDINGS: RIGHT arm PICC line tip projects over mid SVC.

Normal heart size, mediastinal contours and pulmonary vascularity.

Increased atelectasis versus infiltrate at RIGHT lung base.

Mild persistent increased markings at LEFT base appear unchanged,
likely persistent infiltrate.

Remaining lungs clear.

No pleural effusion or pneumothorax.

Bones unremarkable.
IMPRESSION: Tip of RIGHT arm PICC line projects over mid SVC.

Persistent LEFT lower lobe infiltrate with increased atelectasis
versus infiltrate at RIGHT lower lobe.

## 2017-10-02 ENCOUNTER — Ambulatory Visit: Payer: Self-pay | Admitting: General Surgery

## 2017-10-09 DEATH — deceased

## 2017-11-19 ENCOUNTER — Ambulatory Visit: Payer: Self-pay | Admitting: Pulmonary Disease

## 2018-11-19 IMAGING — US US SCROTUM W/ DOPPLER COMPLETE
1 series · 13 of 25 positions shown · non-contrast
Comparison: Abdominopelvic CT earlier this day.

CLINICAL DATA: 62-year-old with testicular pain.

EXAM:
SCROTAL ULTRASOUND
DOPPLER ULTRASOUND OF THE TESTICLES
TECHNIQUE: Complete ultrasound examination of the testicles, epididymis, and
other scrotal structures was performed. Color and spectral Doppler
ultrasound were also utilized to evaluate blood flow to the
testicles.

[Series 1: us scrotum w/ doppler complete · 0.12mm/px · 39 acquisitions, 13 frames shown]
[im 1/39]
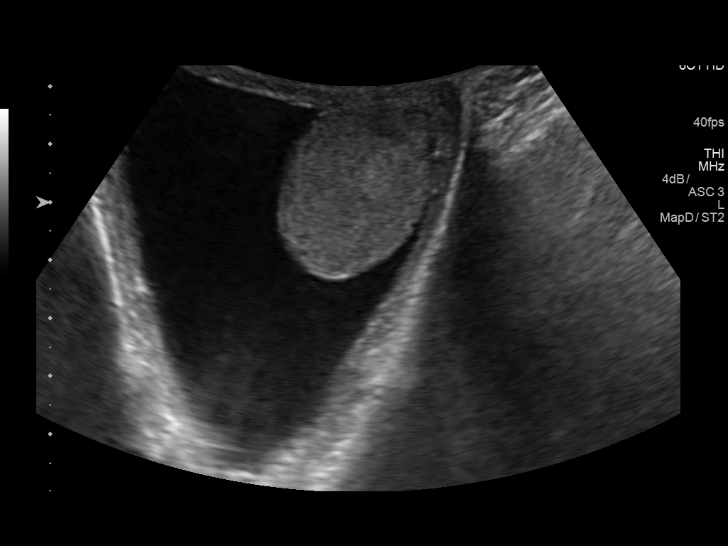
[im 4/39]
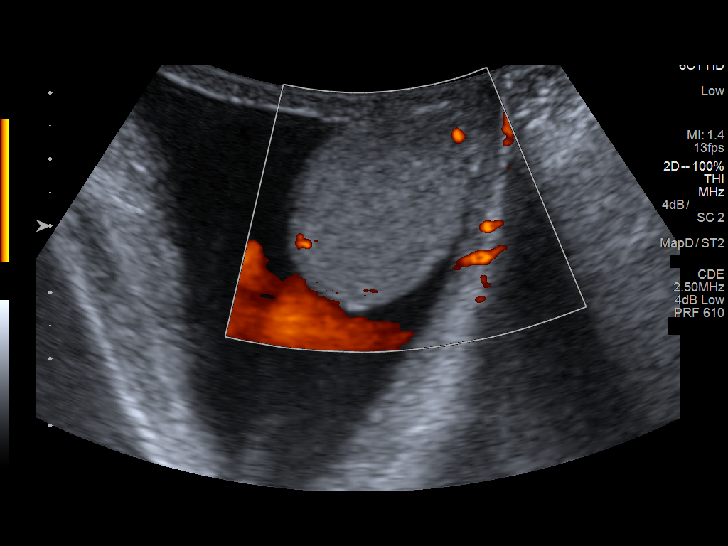
[im 7/39]
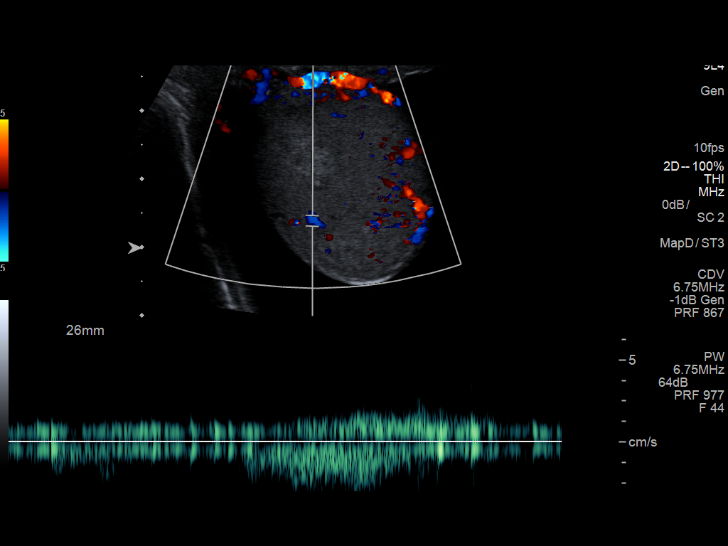
[im 10/39]
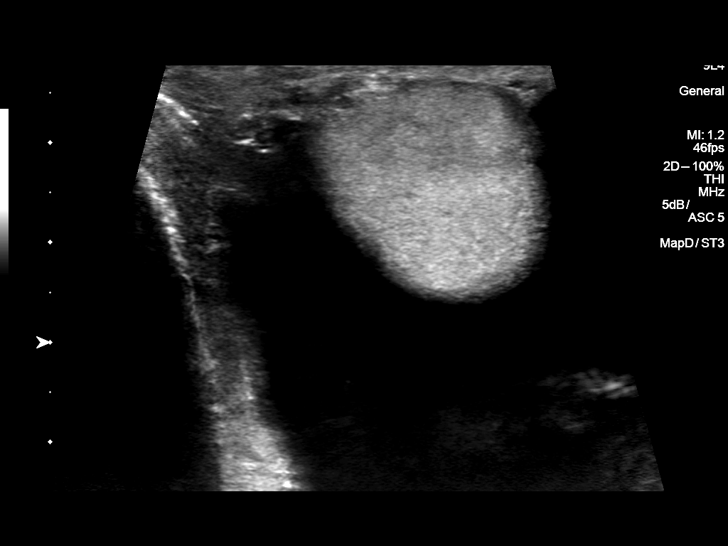
[im 13/39]
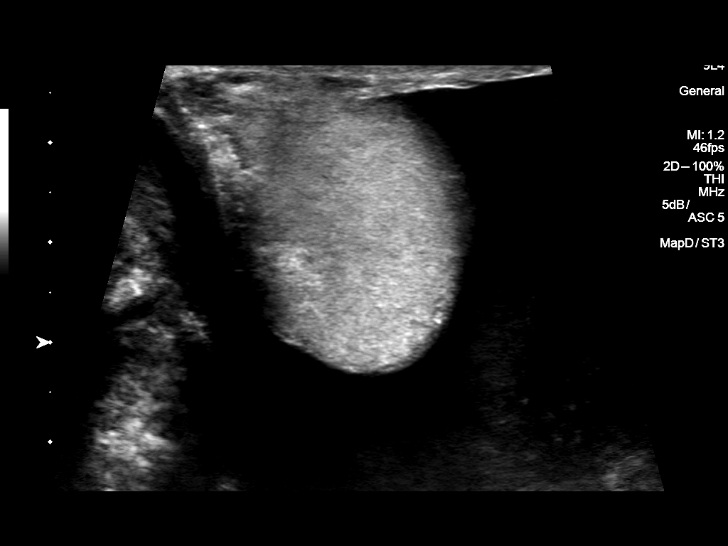
[im 16/39]
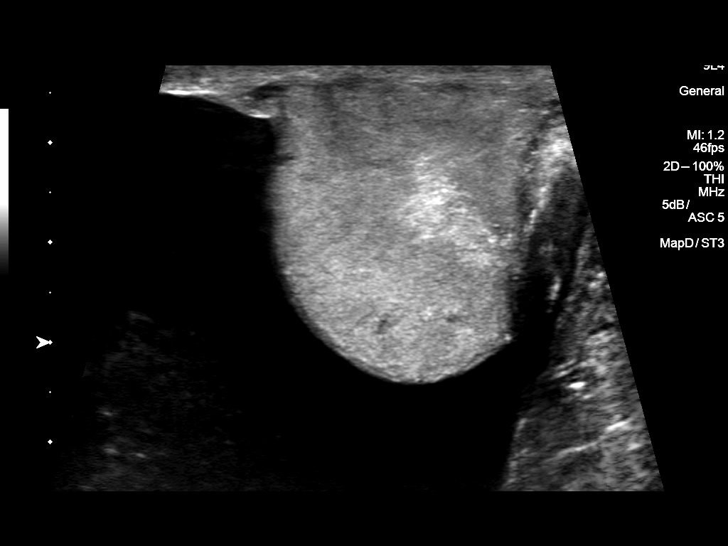
[im 20/39]
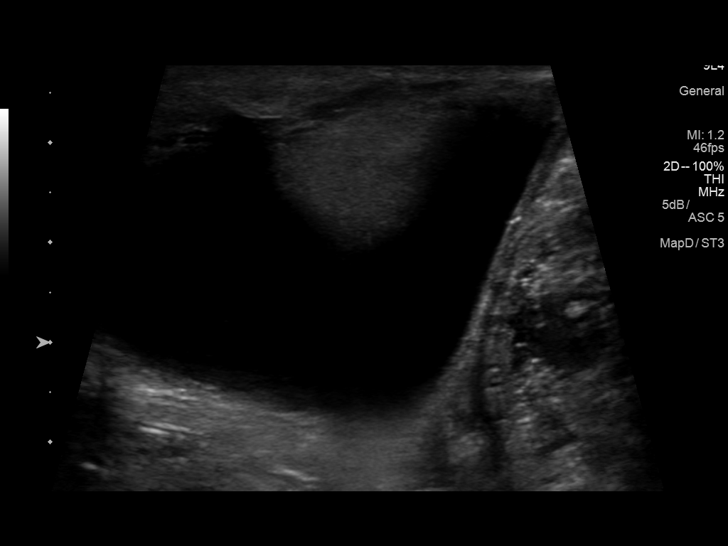
[im 23/39]
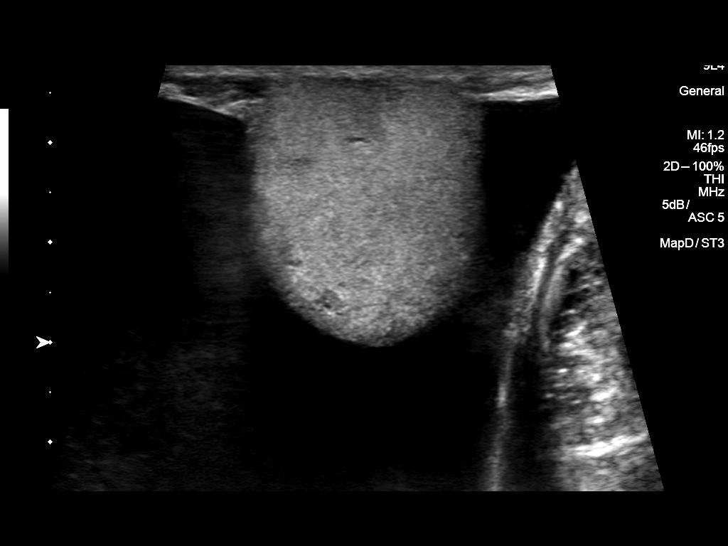
[im 26/39]
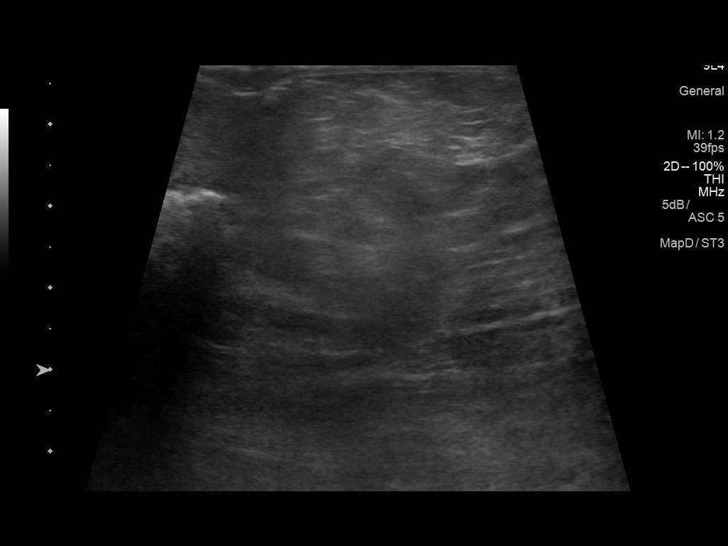
[im 29/39]
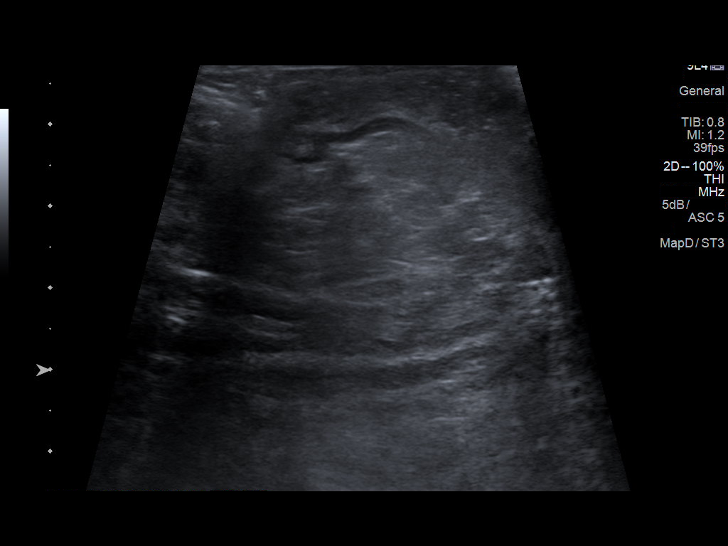
[im 32/39]
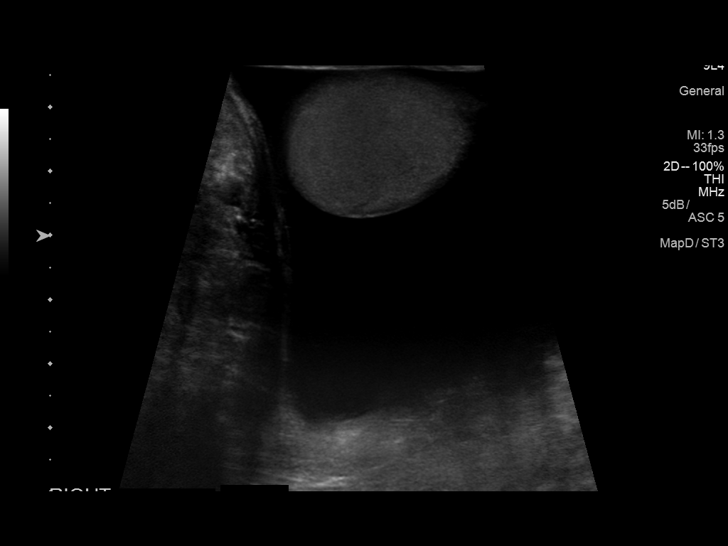
[im 35/39]
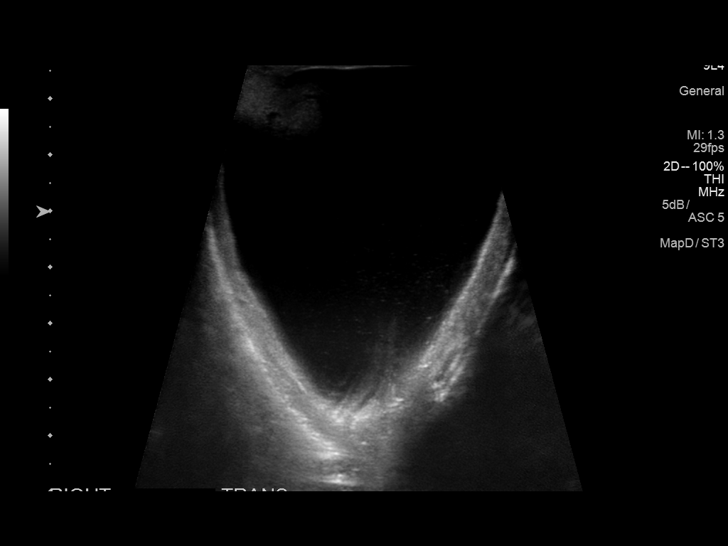
[im 39/39]
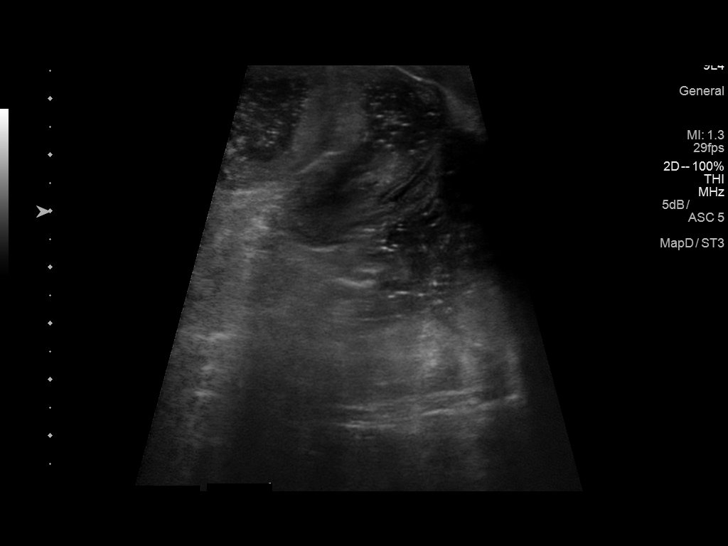

[13 of 25 positions shown; findings below may reference images not displayed]

FINDINGS: Right testicle

Measurements: 3.5 x 2.5 x 2.5 cm. Normal blood flow. No mass or
microlithiasis visualized.

Left testicle

Not visualized, scrotal contents obscured by large bowel containing
hernia in the scrotal sac with bowel peristalsis obscuring
evaluation.

Right epididymis:  Not well visualized.

Left epididymis:  Not well visualized.

Hydrocele:  Moderate on the right, anechoic simple fluid.

Varicocele:  None visualized.

Pulsed Doppler interrogation of the right testis demonstrates normal
low resistance arterial and venous waveforms. Left testis not
visualized therefore Doppler not performed.
IMPRESSION: 1. Large bowel containing left inguinal hernia with peristalsing
bowel filling the scrotal sac, left testicle is obscured by bowel
and not visualized.
2. Normal sonographic appearance of the right testicle with normal
blood flow. Moderate right hydrocele.
